# Patient Record
Sex: Female | Born: 1975 | Race: White | Hispanic: No | Marital: Married | Smoking: Former smoker
Health system: Southern US, Community
[De-identification: ages and names within clinical notes are randomized; demographics above are authoritative.]

## PROBLEM LIST (undated history)

## (undated) DIAGNOSIS — Z9889 Other specified postprocedural states: Secondary | ICD-10-CM

## (undated) DIAGNOSIS — Z8719 Personal history of other diseases of the digestive system: Secondary | ICD-10-CM

## (undated) DIAGNOSIS — Z87442 Personal history of urinary calculi: Secondary | ICD-10-CM

## (undated) DIAGNOSIS — F32A Depression, unspecified: Secondary | ICD-10-CM

## (undated) DIAGNOSIS — Z8489 Family history of other specified conditions: Secondary | ICD-10-CM

## (undated) DIAGNOSIS — M797 Fibromyalgia: Secondary | ICD-10-CM

## (undated) DIAGNOSIS — G43909 Migraine, unspecified, not intractable, without status migrainosus: Secondary | ICD-10-CM

## (undated) DIAGNOSIS — I10 Essential (primary) hypertension: Secondary | ICD-10-CM

## (undated) DIAGNOSIS — K760 Fatty (change of) liver, not elsewhere classified: Secondary | ICD-10-CM

## (undated) DIAGNOSIS — R202 Paresthesia of skin: Secondary | ICD-10-CM

## (undated) DIAGNOSIS — K633 Ulcer of intestine: Secondary | ICD-10-CM

## (undated) DIAGNOSIS — B009 Herpesviral infection, unspecified: Secondary | ICD-10-CM

## (undated) DIAGNOSIS — R768 Other specified abnormal immunological findings in serum: Secondary | ICD-10-CM

## (undated) DIAGNOSIS — K219 Gastro-esophageal reflux disease without esophagitis: Secondary | ICD-10-CM

## (undated) DIAGNOSIS — M1990 Unspecified osteoarthritis, unspecified site: Secondary | ICD-10-CM

## (undated) DIAGNOSIS — J45909 Unspecified asthma, uncomplicated: Secondary | ICD-10-CM

## (undated) DIAGNOSIS — L509 Urticaria, unspecified: Secondary | ICD-10-CM

## (undated) DIAGNOSIS — F419 Anxiety disorder, unspecified: Secondary | ICD-10-CM

## (undated) DIAGNOSIS — M549 Dorsalgia, unspecified: Secondary | ICD-10-CM

## (undated) DIAGNOSIS — S0300XA Dislocation of jaw, unspecified side, initial encounter: Secondary | ICD-10-CM

## (undated) DIAGNOSIS — F988 Other specified behavioral and emotional disorders with onset usually occurring in childhood and adolescence: Secondary | ICD-10-CM

## (undated) DIAGNOSIS — F329 Major depressive disorder, single episode, unspecified: Secondary | ICD-10-CM

## (undated) DIAGNOSIS — R112 Nausea with vomiting, unspecified: Secondary | ICD-10-CM

## (undated) HISTORY — DX: Depression, unspecified: F32.A

## (undated) HISTORY — DX: Other specified abnormal immunological findings in serum: R76.8

## (undated) HISTORY — PX: OTHER SURGICAL HISTORY: SHX169

## (undated) HISTORY — DX: Migraine, unspecified, not intractable, without status migrainosus: G43.909

## (undated) HISTORY — DX: Other specified behavioral and emotional disorders with onset usually occurring in childhood and adolescence: F98.8

## (undated) HISTORY — PX: ENDOMETRIAL ABLATION: SHX621

## (undated) HISTORY — DX: Dislocation of jaw, unspecified side, initial encounter: S03.00XA

## (undated) HISTORY — DX: Urticaria, unspecified: L50.9

## (undated) HISTORY — DX: Fibromyalgia: M79.7

## (undated) HISTORY — DX: Herpesviral infection, unspecified: B00.9

## (undated) HISTORY — DX: Major depressive disorder, single episode, unspecified: F32.9

## (undated) HISTORY — PX: SINOSCOPY: SHX187

## (undated) HISTORY — DX: Gastro-esophageal reflux disease without esophagitis: K21.9

## (undated) HISTORY — PX: HERNIA REPAIR: SHX51

## (undated) HISTORY — DX: Paresthesia of skin: R20.2

## (undated) HISTORY — PX: TUBAL LIGATION: SHX77

---

## 2000-08-08 ENCOUNTER — Emergency Department (HOSPITAL_COMMUNITY): Admission: EM | Admit: 2000-08-08 | Discharge: 2000-08-08 | Payer: Self-pay | Admitting: Emergency Medicine

## 2003-09-26 DIAGNOSIS — K589 Irritable bowel syndrome without diarrhea: Secondary | ICD-10-CM

## 2003-09-26 HISTORY — DX: Irritable bowel syndrome, unspecified: K58.9

## 2007-09-26 HISTORY — PX: PILONIDAL CYST / SINUS EXCISION: SUR543

## 2008-07-23 ENCOUNTER — Emergency Department (HOSPITAL_COMMUNITY): Admission: EM | Admit: 2008-07-23 | Discharge: 2008-07-23 | Payer: Self-pay | Admitting: Emergency Medicine

## 2009-12-18 ENCOUNTER — Emergency Department (HOSPITAL_COMMUNITY): Admission: EM | Admit: 2009-12-18 | Discharge: 2009-12-18 | Payer: Self-pay | Admitting: Emergency Medicine

## 2010-05-12 ENCOUNTER — Other Ambulatory Visit: Admission: RE | Admit: 2010-05-12 | Discharge: 2010-05-12 | Payer: Self-pay | Admitting: Obstetrics & Gynecology

## 2010-06-28 ENCOUNTER — Ambulatory Visit (HOSPITAL_COMMUNITY)
Admission: RE | Admit: 2010-06-28 | Discharge: 2010-06-28 | Payer: Self-pay | Source: Home / Self Care | Admitting: Obstetrics & Gynecology

## 2010-07-01 ENCOUNTER — Inpatient Hospital Stay (HOSPITAL_COMMUNITY): Admission: AD | Admit: 2010-07-01 | Discharge: 2010-07-01 | Payer: Self-pay | Admitting: Obstetrics & Gynecology

## 2010-07-01 ENCOUNTER — Ambulatory Visit: Payer: Self-pay | Admitting: Obstetrics and Gynecology

## 2010-07-05 ENCOUNTER — Inpatient Hospital Stay (HOSPITAL_COMMUNITY): Admission: AD | Admit: 2010-07-05 | Discharge: 2010-07-05 | Payer: Self-pay | Admitting: Obstetrics and Gynecology

## 2010-07-05 ENCOUNTER — Ambulatory Visit: Payer: Self-pay | Admitting: Obstetrics and Gynecology

## 2010-07-26 ENCOUNTER — Ambulatory Visit (HOSPITAL_COMMUNITY): Admission: RE | Admit: 2010-07-26 | Discharge: 2010-07-26 | Payer: Self-pay | Admitting: Obstetrics & Gynecology

## 2010-08-25 ENCOUNTER — Ambulatory Visit (HOSPITAL_COMMUNITY)
Admission: RE | Admit: 2010-08-25 | Discharge: 2010-08-25 | Payer: Self-pay | Source: Home / Self Care | Admitting: Obstetrics & Gynecology

## 2010-09-15 ENCOUNTER — Ambulatory Visit (HOSPITAL_COMMUNITY)
Admission: RE | Admit: 2010-09-15 | Discharge: 2010-09-15 | Payer: Self-pay | Source: Home / Self Care | Attending: Obstetrics & Gynecology | Admitting: Obstetrics & Gynecology

## 2010-10-13 ENCOUNTER — Ambulatory Visit (HOSPITAL_COMMUNITY)
Admission: RE | Admit: 2010-10-13 | Discharge: 2010-10-13 | Payer: Self-pay | Source: Home / Self Care | Attending: Obstetrics & Gynecology | Admitting: Obstetrics & Gynecology

## 2010-10-15 ENCOUNTER — Other Ambulatory Visit: Payer: Self-pay | Admitting: Obstetrics & Gynecology

## 2010-10-15 DIAGNOSIS — O2690 Pregnancy related conditions, unspecified, unspecified trimester: Secondary | ICD-10-CM

## 2010-11-03 ENCOUNTER — Ambulatory Visit (HOSPITAL_COMMUNITY)
Admission: RE | Admit: 2010-11-03 | Discharge: 2010-11-03 | Disposition: A | Discharge status: Home / Self Care | Payer: Medicaid Other | Source: Ambulatory Visit | Attending: Obstetrics & Gynecology | Admitting: Obstetrics & Gynecology

## 2010-11-03 ENCOUNTER — Other Ambulatory Visit: Payer: Self-pay | Admitting: Obstetrics & Gynecology

## 2010-11-03 DIAGNOSIS — O2690 Pregnancy related conditions, unspecified, unspecified trimester: Secondary | ICD-10-CM

## 2010-11-03 DIAGNOSIS — Z3689 Encounter for other specified antenatal screening: Secondary | ICD-10-CM | POA: Insufficient documentation

## 2010-11-03 DIAGNOSIS — O30009 Twin pregnancy, unspecified number of placenta and unspecified number of amniotic sacs, unspecified trimester: Secondary | ICD-10-CM | POA: Insufficient documentation

## 2010-11-03 DIAGNOSIS — O365990 Maternal care for other known or suspected poor fetal growth, unspecified trimester, not applicable or unspecified: Secondary | ICD-10-CM | POA: Insufficient documentation

## 2010-11-03 DIAGNOSIS — O09529 Supervision of elderly multigravida, unspecified trimester: Secondary | ICD-10-CM | POA: Insufficient documentation

## 2010-11-21 ENCOUNTER — Inpatient Hospital Stay (HOSPITAL_COMMUNITY)
Admission: AD | Admit: 2010-11-21 | Discharge: 2010-11-24 | DRG: 774 | Disposition: A | Discharge status: Home / Self Care | Payer: Medicaid Other | Source: Ambulatory Visit | Attending: Obstetrics & Gynecology | Admitting: Obstetrics & Gynecology

## 2010-11-21 DIAGNOSIS — O30009 Twin pregnancy, unspecified number of placenta and unspecified number of amniotic sacs, unspecified trimester: Secondary | ICD-10-CM

## 2010-11-21 DIAGNOSIS — IMO0002 Reserved for concepts with insufficient information to code with codable children: Secondary | ICD-10-CM | POA: Diagnosis present

## 2010-11-21 DIAGNOSIS — O09529 Supervision of elderly multigravida, unspecified trimester: Secondary | ICD-10-CM

## 2010-11-21 LAB — CBC
HCT: 34.2 % — ABNORMAL LOW (ref 36.0–46.0)
Hemoglobin: 11.1 g/dL — ABNORMAL LOW (ref 12.0–15.0)
MCH: 26.1 pg (ref 26.0–34.0)
Platelets: 298 10*3/uL (ref 150–400)
Platelets: 275 10*3/uL (ref 150–400)
RBC: 4.25 MIL/uL (ref 3.87–5.11)
RBC: 4.15 MIL/uL (ref 3.87–5.11)
RDW: 13.8 % (ref 11.5–15.5)
RDW: 13.9 % (ref 11.5–15.5)
WBC: 11.1 10*3/uL — ABNORMAL HIGH (ref 4.0–10.5)
WBC: 13.3 10*3/uL — ABNORMAL HIGH (ref 4.0–10.5)

## 2010-11-22 ENCOUNTER — Other Ambulatory Visit: Payer: Self-pay | Admitting: Family Medicine

## 2010-11-22 LAB — CBC
Hemoglobin: 11 g/dL — ABNORMAL LOW (ref 12.0–15.0)
MCHC: 32.3 g/dL (ref 30.0–36.0)
Platelets: 254 10*3/uL (ref 150–400)
RBC: 4.13 MIL/uL (ref 3.87–5.11)

## 2010-11-23 LAB — CBC
HCT: 30.6 % — ABNORMAL LOW (ref 36.0–46.0)
Hemoglobin: 9.5 g/dL — ABNORMAL LOW (ref 12.0–15.0)
MCV: 84.5 fL (ref 78.0–100.0)
Platelets: 237 10*3/uL (ref 150–400)

## 2010-11-24 NOTE — H&P (Signed)
  NAME:  Ruth Shah, Ruth Shah                ACCOUNT NO.:  [id number]  MEDICAL RECORD NO.:  [id number]           PATIENT TYPE:  I  LOCATION:  9168                          FACILITY:  [facility]  PHYSICIAN:  Tilda Burrow, M.D. DATE OF BIRTH:  April 01, 1976  DATE OF ADMISSION:  11/21/2010 DATE OF DISCHARGE:                             HISTORY & PHYSICAL   ADMITTING DIAGNOSES:  Pregnancy at 38 weeks 6 days, mild preeclampsia, monochorionic diamniotic twin with demise of co-twin at 44 weeks' gestation, now singleton pregnancy, and advanced maternal age.  HISTORY OF PRESENT ILLNESS:  This is a 35 year old female, gravida 5, para 3-0-1-3 is now 38 weeks 6 days after pregnancy followed through [facility] and maternal fetal care.  Pregnancy has been complicated by pregnancy that started out as a monochorionic diamniotic pregnancy with an membrane between the babies clearly identified.  We had fetal demise in utero at 15 weeks with one of the babies.  The subsequent pregnancy has been followed with serial growth ultrasounds at maternal fetal care with estimated fetal weight at the 17th percentile with a head sparing growth pattern with femur length less than the 3rd percentile.  Abdominal circumference is approximately 5th percentile. Amniotic fluid volume has remained normal.  Over the past few days, the patient has had progression of blood pressures from the 120-130/80 that was the rule for majority of the pregnancy to 140/90 and today her blood pressure is 158/98 accompanied by headache and some scotoma.  24-hour urine was collected on tube and submitted on November 18, 2010.  Those results of pending.  Reflexes are 3+.  Cervix is 2 cm, firm, long, anterior with vertex presentation confirmed at -2 station.  She was admitted for probable cervical ripening with Foley bulb followed by Pitocin and magnesium sulfate therapy.  PAST MEDICAL HISTORY:  Hypercholesterolemia.  SURGICAL HISTORY:  Sinus  surgery, ovarian cyst, back surgery.  ALLERGIES:  VICODIN and SULFA.  LABORATORY DATA:  Prenatal lab, blood type O+, antibody screen negative, rubella immunity present.  Hemoglobin 14, hematocrit 42, platelets 270,000 at last check HSV-2 positive, HIV and RPR, GC and Chlamydia are all negative.  Down syndrome risk was 1 and 6  but was considered unreliable due to the IUFD of the twin.  Glucose tolerance test normal.  Physical exam reveals a healthy Caucasian female, weight 165.4 which is 10-pound weight gain this pregnancy, blood pressure 158/98 and 160/100. Urinalysis is negative for protein again 24-hour urine is pending, fundal height is 37 cm.  NST today is reactive.  Vertex presentation was confirmed.  Cervix 2 cm firm, long anterior, vertex -2.  PLAN:  Admit for induction of labor, Dr. Natale Milch and Dr. Macon Large, room 171.  ADDENDUM:  The patient desires postpartum tubal ligation at 6 weeks after delivery.     Tilda Burrow, M.D.     JVF/MEDQ  D:  11/21/2010  T:  11/22/2010  Job:  [id number]  Electronically Signed by Christin Bach M.D. on 11/24/2010 08:46:50 PM

## 2010-12-08 LAB — CBC
MCHC: 34.7 g/dL (ref 30.0–36.0)
MCV: 93.7 fL (ref 78.0–100.0)
Platelets: 270 10*3/uL (ref 150–400)
RDW: 13 % (ref 11.5–15.5)
WBC: 14.4 10*3/uL — ABNORMAL HIGH (ref 4.0–10.5)

## 2010-12-08 LAB — URINALYSIS, ROUTINE W REFLEX MICROSCOPIC
Bilirubin Urine: NEGATIVE
Bilirubin Urine: NEGATIVE
Glucose, UA: NEGATIVE mg/dL
Leukocytes, UA: NEGATIVE
Nitrite: NEGATIVE
Specific Gravity, Urine: 1.025 (ref 1.005–1.030)
Urobilinogen, UA: 0.2 mg/dL (ref 0.0–1.0)
Urobilinogen, UA: 0.2 mg/dL (ref 0.0–1.0)
pH: 6 (ref 5.0–8.0)
pH: 6 (ref 5.0–8.0)

## 2010-12-08 LAB — URINE MICROSCOPIC-ADD ON

## 2010-12-08 LAB — COMPREHENSIVE METABOLIC PANEL
BUN: 7 mg/dL (ref 6–23)
Calcium: 9.3 mg/dL (ref 8.4–10.5)
GFR calc Af Amer: >60 mL/min (ref >60)
Potassium: 3.4 mEq/L — ABNORMAL LOW (ref 3.5–5.1)
Sodium: 135 mEq/L (ref 135–145)
Total Bilirubin: 0.7 mg/dL (ref 0.3–1.2)

## 2011-02-09 ENCOUNTER — Other Ambulatory Visit: Payer: Self-pay | Admitting: Obstetrics and Gynecology

## 2011-02-09 ENCOUNTER — Encounter (HOSPITAL_COMMUNITY): Payer: Medicaid Other

## 2011-02-09 LAB — CBC
HCT: 35.4 % — ABNORMAL LOW (ref 36.0–46.0)
Hemoglobin: 11.2 g/dL — ABNORMAL LOW (ref 12.0–15.0)
MCH: 26.3 pg (ref 26.0–34.0)
MCHC: 31.6 g/dL (ref 30.0–36.0)
MCV: 83.1 fL (ref 78.0–100.0)
Platelets: 302 10*3/uL (ref 150–400)
RBC: 4.26 MIL/uL (ref 3.87–5.11)
RDW: 15.4 % (ref 11.5–15.5)
WBC: 6.6 10*3/uL (ref 4.0–10.5)

## 2011-02-09 LAB — HCG, QUANTITATIVE, PREGNANCY: hCG, Beta Chain, Quant, S: 1 m[IU]/mL (ref <5)

## 2011-02-09 LAB — COMPREHENSIVE METABOLIC PANEL
ALT: 25 U/L (ref 0–35)
Chloride: 106 mEq/L (ref 96–112)
Creatinine, Ser: 0.78 mg/dL (ref 0.4–1.2)
GFR calc non Af Amer: >60 mL/min (ref >60)
Total Bilirubin: 0.4 mg/dL (ref 0.3–1.2)

## 2011-02-12 NOTE — H&P (Signed)
  Ruth Shah, Ruth Shah                ACCOUNT NO.:  [id number]  MEDICAL RECORD NO.:  [id number]           PATIENT TYPE:  LOCATION:                                 FACILITY:  PHYSICIAN:  Tilda Burrow, M.D. DATE OF BIRTH:  Aug 11, 1976  DATE OF ADMISSION:  02/14/2011 DATE OF DISCHARGE:  [facility]                             HISTORY & PHYSICAL   PREOPERATIVE DIAGNOSES: 1. Desire for elective permanent sterilization. 2. Menorrhagia. 3. Postpartum x2 months.  HISTORY OF PRESENT ILLNESS:  This 35 year old female, gravida 5, para 3- 1-2-4, with last delivery in March is admitted at this time 2 months postpartum for hysteroscopy, D&C with endometrial ablation to be performed at the time of her tubal sterilization.  Ruth Shah has a history of heavy periods predating her last pregnancy and resuming since she has delivered.  Menses are heavy x4-6 days requiring decreased activity for 3 days with bedrest required for 2 days every other month due to the severity of the bleeding and clotting.  There is associated dyspareunia that will not be addressed by the surgery.  The patient desires permanent sterilization and so a combined procedure is planned.  Both tubal sterilization and hysteroscopy, D&C have been reviewed with the patient.  Questions have been answered to the patient's satisfaction. She has had a routine uncomplicated postpartum course otherwise.  PAST MEDICAL HISTORY:  Positive for depression and anxiety.  SOCIAL HISTORY:  Married, husband does consume alcohol.  The patient's personal medical history negative for any current medications.  She denies alcohol, cigarettes, or recreational drugs.  PHYSICAL EXAMINATION:  GENERAL:  She is a healthy somber female who appears in good spirits, alert and oriented x3. EYES:  Pupils equal and reactive. NECK:  Supple.  Normal thyroid. CHEST:  Clear to auscultation. HEART/LUNGS:  Unremarkable. ABDOMEN:  No masses or hernias. EXTERNAL GENITALIA:   Multiparous postpartum.  Cervix bulbous.  Uterus is retroverted, upper limits normal size with sensitivity to uterine contact with bimanual exam.  Rectal support is adequate as is bladder support.  IMPRESSION: 1. Heavy menses (menorrhagia). 2. Desire for elective permanent sterilization. 3. Dyspareunia secondary to uterine retroversion not to be addressed     this visit.  PLAN:  Laparoscopic tubal sterilization, Falope rings, hysteroscopy, D&C, endometrial ablation to be performed Feb 14, 2011 at [facility] 9:00 a.m., preop May 17 at 9:00 a.m.     Tilda Burrow, M.D.     JVF/MEDQ  D:  02/09/2011  T:  02/09/2011  Job:  [id number]  Electronically Signed by Christin Bach M.D. on 02/12/2011 09:19:32 PM

## 2011-02-14 ENCOUNTER — Ambulatory Visit (HOSPITAL_COMMUNITY)
Admission: RE | Admit: 2011-02-14 | Discharge: 2011-02-14 | Disposition: A | Discharge status: Home / Self Care | Payer: Medicaid Other | Source: Ambulatory Visit | Attending: Obstetrics and Gynecology | Admitting: Obstetrics and Gynecology

## 2011-02-14 DIAGNOSIS — Z302 Encounter for sterilization: Secondary | ICD-10-CM | POA: Insufficient documentation

## 2011-02-14 DIAGNOSIS — N920 Excessive and frequent menstruation with regular cycle: Secondary | ICD-10-CM | POA: Insufficient documentation

## 2011-02-14 LAB — URINALYSIS, ROUTINE W REFLEX MICROSCOPIC
Leukocytes, UA: NEGATIVE
Nitrite: NEGATIVE
Specific Gravity, Urine: 1.025 (ref 1.005–1.030)

## 2011-02-14 LAB — URINE MICROSCOPIC-ADD ON

## 2011-02-18 NOTE — Op Note (Signed)
Ruth Shah, Ruth Shah                ACCOUNT NO.:  [id number]  MEDICAL RECORD NO.:  [id number]           PATIENT TYPE:  O  LOCATION:  DAYP                          FACILITY:  [facility]  PHYSICIAN:  Tilda Burrow, M.D. DATE OF BIRTH:  06/19/1976  DATE OF PROCEDURE:  02/14/2011 DATE OF DISCHARGE:                              OPERATIVE REPORT   PREOPERATIVE DIAGNOSES:  Elective sterilization and menorrhagia.  POSTOPERATIVE DIAGNOSES:  Elective sterilization and menorrhagia.  PROCEDURES: 1. Laparoscopic right tubal sterilization with Falope rings. 2. Hysteroscopy, dilatation and curettage, and endometrial ablation.  SURGEON:  Tilda Burrow, MD  FIRST ASSISTANT:  None.  ANESTHESIA:  General.  COMPLICATIONS:  None.  FINDINGS:  Evidence of prior left salpingo-oophorectomy.  Normal- appearing right tube and ovary.  Retroverted and retroflexed uterus, normal size.  Thin endometrium status post recent delivery 2 months ago.  INDICATIONS:  This is a 35 year old female 2 months postpartum, desiring permanent sterilization, with prior left ovary removal which was found to have been handled by left salpingo-oophorectomy.  The patient also gives a history of unacceptably heavy periods and requests endometrial ablation at this time.  DETAILS OF PROCEDURE:  The patient was taken to the operating room and prepped and draped for laparoscopic tubal sterilization.  Time-out conducted by all involved parties and procedure confirmed.  An infraumbilical vertical 1-cm skin incision was made and direct insertion technique used under 3 mmHg, carbon dioxide insufflation pressures, and easy entry into the peritoneal cavity performed with no evidence of injury to bowel.  The pelvis was inspected and the tubes and ovaries identified on the right with the left tube and ovary obviously removed in the past.  Photo documentation was taken of both sides, the right side then had Falope ring applied to its  midportion after manipulating the uterus and placing a suprapubic 8-mm trocar to allow introduction of the Falope ring applier.  Percutaneous Marcaine was injected into the incarcerated knuckle of tube as well as in the mesosalpinx beneath the Falope rings.  Saline 50 mL was injected into the abdomen to assist with evacuation of the gas, the abdomen was deflated, laparoscopic instruments were removed, and subcuticular 4-0 Vicryl closure of skin incisions performed.  Attention was then directed to the hysteroscopy, D and C, and endometrial ablation.  The position repositioned, the speculum inserted. Cervix grasped with single-tooth tenaculum, sounded to 8 cm dilated to 23-French, and hysteroscopy with a 23-mm 30-degree rigid hysteroscope was performed to confirm satisfactory identification of endometrial cavity with minimal tissue identified and no suspicious areas.  Brief curettage was performed removing a thin layer of endometrial debris and then once A1c and hysteroscopy repeated to confirm satisfactory evacuation of debris and then the Gynecare ThermaChoice III endometrial ablation device activated, inserted, insufflated with 15 mL of D5W and the 8-minute thermal ablation sequence completed.  All 8 mL were removed accounted for and then a percutaneous block with 20 mL of Marcaine solution was then injected around the uterus.  During the ablation process, the resting tone pressures gradually declined and upon reaching 130, we began to instill a bolus of Pitocin  which may have assisted with stabilizing the pressure dropped as the intrauterine pressures remained at 130 mmHg for the remainder of the case.  The patient tolerated the procedure well and went to recovery room in good condition.  Sponge and needle counts were correct.  The patient will receive hydrocodone and Motrin prescriptions prior to discharge and follow up in 2 weeks.     Tilda Burrow, M.D.     JVF/MEDQ  D:   02/14/2011  T:  02/15/2011  Job:  [id number]  cc:   [facility]  Electronically Signed by Christin Bach M.D. on 02/18/2011 11:26:29 PM

## 2011-06-27 LAB — URINE MICROSCOPIC-ADD ON

## 2011-06-27 LAB — DIFFERENTIAL
Eosinophils Absolute: 0.2
Eosinophils Relative: 2
Lymphocytes Relative: 16
Lymphs Abs: 1.6
Monocytes Absolute: 0.6
Monocytes Relative: 6

## 2011-06-27 LAB — CBC
HCT: 37.6
Hemoglobin: 13
MCV: 92.4
Platelets: 260
RBC: 4.07
WBC: 10.1

## 2011-06-27 LAB — URINALYSIS, ROUTINE W REFLEX MICROSCOPIC
Ketones, ur: NEGATIVE
Protein, ur: NEGATIVE
Urobilinogen, UA: 0.2

## 2015-08-12 ENCOUNTER — Encounter: Payer: Self-pay | Admitting: Women's Health

## 2015-08-12 ENCOUNTER — Other Ambulatory Visit (HOSPITAL_COMMUNITY)
Admission: RE | Admit: 2015-08-12 | Discharge: 2015-08-12 | Disposition: A | Discharge status: Home / Self Care | Payer: Medicaid Other | Source: Ambulatory Visit | Attending: Obstetrics & Gynecology | Admitting: Obstetrics & Gynecology

## 2015-08-12 ENCOUNTER — Ambulatory Visit (INDEPENDENT_AMBULATORY_CARE_PROVIDER_SITE_OTHER): Payer: Medicaid Other | Admitting: Women's Health

## 2015-08-12 VITALS — BP 136/78 | HR 68 | Ht 63.0 in | Wt 173.0 lb

## 2015-08-12 DIAGNOSIS — Z01419 Encounter for gynecological examination (general) (routine) without abnormal findings: Secondary | ICD-10-CM | POA: Insufficient documentation

## 2015-08-12 DIAGNOSIS — A630 Anogenital (venereal) warts: Secondary | ICD-10-CM

## 2015-08-12 DIAGNOSIS — Z309 Encounter for contraceptive management, unspecified: Secondary | ICD-10-CM

## 2015-08-12 DIAGNOSIS — Z1151 Encounter for screening for human papillomavirus (HPV): Secondary | ICD-10-CM | POA: Insufficient documentation

## 2015-08-12 DIAGNOSIS — N760 Acute vaginitis: Secondary | ICD-10-CM

## 2015-08-12 DIAGNOSIS — L292 Pruritus vulvae: Secondary | ICD-10-CM

## 2015-08-12 DIAGNOSIS — B3731 Acute candidiasis of vulva and vagina: Secondary | ICD-10-CM

## 2015-08-12 DIAGNOSIS — B009 Herpesviral infection, unspecified: Secondary | ICD-10-CM

## 2015-08-12 DIAGNOSIS — B373 Candidiasis of vulva and vagina: Secondary | ICD-10-CM

## 2015-08-12 DIAGNOSIS — B9689 Other specified bacterial agents as the cause of diseases classified elsewhere: Secondary | ICD-10-CM

## 2015-08-12 DIAGNOSIS — Z113 Encounter for screening for infections with a predominantly sexual mode of transmission: Secondary | ICD-10-CM | POA: Insufficient documentation

## 2015-08-12 LAB — POCT WET PREP (WET MOUNT): Clue Cells Wet Prep Whiff POC: NEGATIVE

## 2015-08-12 MED ORDER — METRONIDAZOLE 500 MG PO TABS: 500.0000 mg | ORAL_TABLET | Freq: Two times a day (BID) | ORAL | Status: DC

## 2015-08-12 MED ORDER — FLUCONAZOLE 150 MG PO TABS: 150.0000 mg | ORAL_TABLET | Freq: Once | ORAL | Status: DC

## 2015-08-12 NOTE — Patient Instructions (Signed)
[phone] and ask about mammogram scholarship

## 2015-08-12 NOTE — Progress Notes (Addendum)
Patient ID: Ruth Shah, female   DOB: 1976/07/04, 39 y.o.   MRN: [id number] Subjective:   Ruth Shah is a 39 y.o. [phone] Caucasian female here for a routine FP Mcaid well-woman exam.  No LMP recorded. Patient has had an ablation.    Current complaints: burning/itching/swelling after sex x last 6 months, only happens when has sex >1time in 48hrs. Husband works out of town so when he comes home sometimes do have sex multiple times. Doesn't feel like she is dry or that moisture is an issue. No odor. He does not wear a condom. Was told in past she may have vulvar eczema. Has h/o HSV2, occ outbreaks, also w/ h/o genital warts- no current problems.  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  Past Medical History Past Medical History  Diagnosis Date  . Fibromyalgia   . ADD (attention deficit disorder)   . Depression   . Herpes simplex disease     Past Surgical History Past Surgical History  Procedure Laterality Date  . Tubal ligation    . Ablation    . Hernia repair    . Cyst excision      Gynecologic History No obstetric history on file.  No LMP recorded. Patient has had an ablation. Contraception: s/p ablation Last Pap: 2012. Results were: normal Last mammogram: never. Results were: n/a Last TCS: never  Obstetric History OB History  No data available    Current Medications No current outpatient prescriptions on file prior to visit.   No current facility-administered medications on file prior to visit.    Review of Systems Patient denies any  headaches, blurred vision, shortness of breath, chest pain, abdominal pain, problems with bowel movements, urination, or intercourse.  Objective:  BP 136/78 mmHg  Pulse 68  Ht  (1.6 m)  Wt 173 lb (78.472 kg)  BMI 30.65 kg/m2 Physical Exam  General:  Well developed, well nourished, no acute distress. She is alert and oriented x3. Skin:  Warm and dry Neck:  Midline trachea, no thyromegaly or nodules Cardiovascular: Regular rate and rhythm, no murmur heard Lungs:  Effort normal, all lung fields clear to auscultation bilaterally Breasts:  No dominant palpable mass, retraction, or nipple discharge Abdomen:  Soft, non tender, no hepatosplenomegaly or masses Pelvic:  External genitalia is erythematous and slightly edematous.  The vagina is normal in appearance. The cervix is bulbous and anterior, no CMT.  Thin prep pap is done w/ HR HPV cotesting. Uterus is felt to be normal size, shape, and contour.  No adnexal masses or tenderness noted. Extremities:  No swelling or varicosities noted Psych:  She has a normal mood and affect  Results for orders placed or performed in visit on 08/12/15 (from the past 24 hour(s))  POCT Wet Prep [facility])     Status: Abnormal   Collection Time: 08/12/15 10:32 AM  Result Value Ref Range   Source Wet Prep POC vaginal    WBC, Wet Prep HPF POC none    Bacteria Wet Prep HPF POC None None, Few, Too numerous to count   BACTERIA WET PREP MORPHOLOGY POC  Clue Cells Wet Prep HPF POC Few (A) None, Too numerous to count   Clue Cells Wet Prep Whiff POC Negative Whiff    Yeast Wet Prep HPF POC Few    KOH Wet Prep POC     Trichomonas Wet Prep HPF POC none     Vulva painted w/ gentian violet after wet prep Assessment:   Healthy well-woman exam Vulvovaginal candida, painted w/ gentian violet BV  Plan:  GC/CT from pap, HIV, RPR today Rx diflucan and metronidazole, to call back if sx not improving F/U [age]yr for physical, or sooner if needed [facility]ammogram- [facility] screening [facility]o  Colonoscopy @39yo  or sooner if problems  Marge DuncansBooker, Irja Wheless Randall CNM, [facility]BC 08/12/2015 10:33 AM

## 2015-08-13 LAB — HIV ANTIBODY (ROUTINE TESTING W REFLEX): HIV Screen 4th Generation wRfx: NONREACTIVE

## 2015-08-13 LAB — RPR: RPR: NONREACTIVE

## 2015-08-16 LAB — CYTOLOGY - PAP

## 2016-05-19 ENCOUNTER — Ambulatory Visit (INDEPENDENT_AMBULATORY_CARE_PROVIDER_SITE_OTHER): Payer: Self-pay | Admitting: Neurology

## 2016-05-19 ENCOUNTER — Ambulatory Visit (INDEPENDENT_AMBULATORY_CARE_PROVIDER_SITE_OTHER): Payer: Medicaid Other | Admitting: Neurology

## 2016-05-19 DIAGNOSIS — Z0289 Encounter for other administrative examinations: Secondary | ICD-10-CM

## 2016-05-19 DIAGNOSIS — R202 Paresthesia of skin: Secondary | ICD-10-CM

## 2016-05-19 NOTE — Procedures (Signed)
   NCS (NERVE CONDUCTION STUDY) WITH EMG (ELECTROMYOGRAPHY) REPORT   STUDY DATE: May 19 2016 PATIENT NAME: Ruth Shah DOB: Mar 11, 1976 MRN: [id number]    TECHNOLOGIST: Gearldine ShownLorraine Jones ELECTROMYOGRAPHER: Levert FeinsteinYan, Kendrell Lottman M.D.  CLINICAL INFORMATION: 40 years old female presenting with more than 15 years history of intermittent bilateral hands paresthesia, gradually getting worse  On examination: Bilateral upper and lower extremity motor strength strength is normal, deep tendon reflexes are brisk and symmetric.  FINDINGS: NERVE CONDUCTION STUDY: Bilateral median, ulnar sensory and motor responses were normal.  NEEDLE ELECTROMYOGRAPHY: Selective needle examinations were performed at bilateral upper extremity muscles bilateral cervical paraspinal muscles.  Needle examination of bilateral abductor pollicis brevis, first dorsal interossei, brachioradialis, biceps, triceps, deltoid, was normal.  There was no spontaneous activity at bilateral cervical paraspinal muscles, bilateral C5-6-7.  IMPRESSION:   This is a normal study. There is no electrodiagnostic evidence of upper extremity neuropathy or bilateral cervical radiculopathy.   INTERPRETING PHYSICIAN:   Levert FeinsteinYan, Amonie Wisser M.D. Ph.D. [facility] [address], [address] [place], [place] [address] [phone]

## 2016-05-19 NOTE — Progress Notes (Signed)
EMG nerve conduction study report under the procedure section

## 2016-06-06 ENCOUNTER — Encounter: Payer: Self-pay | Admitting: Neurology

## 2016-06-06 ENCOUNTER — Ambulatory Visit (INDEPENDENT_AMBULATORY_CARE_PROVIDER_SITE_OTHER): Payer: Medicaid Other | Admitting: Neurology

## 2016-06-06 VITALS — BP 131/88 | HR 84 | Ht 63.0 in | Wt 170.5 lb

## 2016-06-06 DIAGNOSIS — M542 Cervicalgia: Secondary | ICD-10-CM | POA: Diagnosis not present

## 2016-06-06 DIAGNOSIS — G43709 Chronic migraine without aura, not intractable, without status migrainosus: Secondary | ICD-10-CM

## 2016-06-06 DIAGNOSIS — R202 Paresthesia of skin: Secondary | ICD-10-CM | POA: Diagnosis not present

## 2016-06-06 MED ORDER — NORTRIPTYLINE HCL 25 MG PO CAPS: 50.0000 mg | ORAL_CAPSULE | Freq: Every day | ORAL | 11 refills | Status: DC

## 2016-06-06 MED ORDER — ONDANSETRON HCL 4 MG PO TABS: 4.0000 mg | ORAL_TABLET | Freq: Three times a day (TID) | ORAL | 6 refills | Status: DC | PRN

## 2016-06-06 MED ORDER — SUMATRIPTAN SUCCINATE 50 MG PO TABS: 50.0000 mg | ORAL_TABLET | ORAL | 6 refills | Status: DC | PRN

## 2016-06-06 NOTE — Progress Notes (Signed)
PATIENT: Ruth Shah DOB: Feb 04, 1976  Chief Complaint  Patient presents with  . Numbness    She is here with her husband, Ruth Shah.  The numbness in her bilateral hands have been present for years.  Symptoms worse at night.  She would like to review her NCV/EMG and treatment options.     HISTORICAL  Ruth Shah is a 39 years old right-handed female, accompanied by her husband Ruth Shah, seen in refer by her primary care physician Dr. Selinda Flavin for evaluation of bilateral hands paresthesia.  She had a history of depression, taking Zoloft 50 mg daily, reported under good control, ADHD, is taking Adderall XR 20 mg every morning since 2015, she works as Armed forces training and education officer, has 4 children. I saw her initially on August 25th 2017 for complains of bilateral hands numbness, electrodiagnostic study was normal, there was no evidence of bilateral upper extremity neuropathy or cervical radiculopathy.  Her hands has been bother her Since 2002, she reported previous normal EMG nerve conduction study too. Over the years, she has worsening bilateral hands paresthesia, involving all 5 fingers, radiating to to her forearm, elbow, since 2016, she also noticed neck pain, mild unbalanced gait, she has urinary urgency, she contributed to her fall vaginal delivery.  Also has a history of chronic migraine since 2007, used to have migraine once a month, since 2015, she has couple migraines each week, bilateral severe pounding headache with associated light noise sensitivity, trigger for her migraines are weather change, stress, sleep deprivation, hungry, bright light, strong smells, her headache lasting for hours to one day, she has been taking naproxen 500 mg every morning since March 2017 for her mild to moderate headaches is limited result,  She also complains of chronic neck pain, upper back pain, radiating pain to occipital region,  REVIEW OF SYSTEMS: Full 14 system review of systems performed and notable only  for  as above  ALLERGIES: Allergies  Allergen Reactions  . Vicodin [Hydrocodone-Acetaminophen] Itching    HOME MEDICATIONS: Current Outpatient Prescriptions  Medication Sig Dispense Refill  . ADDERALL XR 20 MG 24 hr capsule Take 20 mg by mouth every morning.  0  . sertraline (ZOLOFT) 50 MG tablet Take 50 mg by mouth daily.  2   No current facility-administered medications for this visit.     PAST MEDICAL HISTORY: Past Medical History:  Diagnosis Date  . ADD (attention deficit disorder)   . Depression   . Fibromyalgia   . Herpes simplex disease   . Paresthesia of both hands     PAST SURGICAL HISTORY: Past Surgical History:  Procedure Laterality Date  . ABLATION    . CYST EXCISION    . HERNIA REPAIR    . TUBAL LIGATION      FAMILY HISTORY: Family History  Problem Relation Age of Onset  . Cancer Mother     skin  . Depression Mother   . Hypertension Mother   . Hyperlipidemia Mother   . Cancer Father     skin  . Arthritis Father   . Hypertension Father   . Hyperlipidemia Father   . Depression Maternal Grandmother   . Hypertension Maternal Grandmother   . Mental illness Maternal Grandmother   . Cancer Maternal Grandfather     kidney  . Hypertension Maternal Grandfather   . Kidney disease Maternal Grandfather   . Emphysema Paternal Grandmother   . Alzheimer's disease Paternal Grandmother   . Parkinson's disease Paternal Grandfather     SOCIAL  HISTORY:  Social History   Social History  . Marital status: Married    Spouse name: N/A  . Number of children: 4  . Years of education: Some College   Occupational History  . Not on file.   Social History Main Topics  . Smoking status: Never Smoker  . Smokeless tobacco: Never Used  . Alcohol use No  . Drug use: No  . Sexual activity: Yes    Birth control/ protection: Surgical   Other Topics Concern  . Not on file   Social History Narrative   Lives at home with husband and children.   Right-handed.     4 cups caffeine per week.     PHYSICAL EXAM   Vitals:   06/06/16 0926  BP: 131/88  Pulse: 84  Weight: 170 lb 8 oz (77.3 kg)  Height: 5\' 3"  (1.6 m)    Not recorded      Body mass index is 30.2 kg/m.  PHYSICAL EXAMNIATION:  Gen: NAD, conversant, well nourised, obese, well groomed                     Cardiovascular: Regular rate rhythm, no peripheral edema, warm, nontender. Eyes: Conjunctivae clear without exudates or hemorrhage Neck: Supple, no carotid bruise. Pulmonary: Clear to auscultation bilaterally   NEUROLOGICAL EXAM:  MENTAL STATUS: Speech:    Speech is normal; fluent and spontaneous with normal comprehension.  Cognition:     Orientation to time, place and person     Normal recent and remote memory     Normal Attention span and concentration     Normal Language, naming, repeating,spontaneous speech     Fund of knowledge   CRANIAL NERVES: CN II: Visual fields are full to confrontation. Fundoscopic exam is normal with sharp discs and no vascular changes. Pupils are round equal and briskly reactive to light. CN III, IV, VI: extraocular movement are normal. No ptosis. CN V: Facial sensation is intact to pinprick in all 3 divisions bilaterally. Corneal responses are intact.  CN VII: Face is symmetric with normal eye closure and smile. CN VIII: Hearing is normal to rubbing fingers CN IX, X: Palate elevates symmetrically. Phonation is normal. CN XI: Head turning and shoulder shrug are intact CN XII: Tongue is midline with normal movements and no atrophy.  MOTOR: There is no pronator drift of out-stretched arms. Muscle bulk and tone are normal. Muscle strength is normal.  REFLEXES: Reflexes are 2+ and symmetric at the biceps, triceps, knees, and ankles. Plantar responses are flexor.  SENSORY: Intact to light touch, pinprick, positional sensation and vibratory sensation are intact in fingers and toes.  COORDINATION: Rapid alternating movements and fine  finger movements are intact. There is no dysmetria on finger-to-nose and heel-knee-shin.    GAIT/STANCE: Posture is normal. Gait is steady with normal steps, base, arm swing, and turning. Heel and toe walking are normal. Tandem gait is normal.  Romberg is absent.   DIAGNOSTIC DATA (LABS, IMAGING, TESTING) - I reviewed patient records, labs, notes, testing and imaging myself where available.   ASSESSMENT AND PLAN  Ruth Shah is a 40 y.o. female   Chronic migraine headaches Neck pain, four extremity paresthesia  Normal EMG nerve conduction studies, brisk reflex on examinations,   MRI of cervical spine to rule out cervical pathology   Add on nortriptyline 25 mg titrating to 50 mg for migraine prevention and pain  Imitrex as needed for migraine    Levert Feinstein, M.D. Ph.D.  [facility] [address], [address] [place], [place] [address] Ph: [phone] Fax: [phone]  CC: Selinda FlavinKevin Howard, MD

## 2016-06-07 ENCOUNTER — Encounter: Payer: Self-pay | Admitting: *Deleted

## 2016-06-07 LAB — CBC
HEMOGLOBIN: 14 g/dL (ref 11.1–15.9)
Hematocrit: 41.4 % (ref 34.0–46.6)
MCH: 30.6 pg (ref 26.6–33.0)
MCHC: 33.8 g/dL (ref 31.5–35.7)
MCV: 90 fL (ref 79–97)
PLATELETS: 322 10*3/uL (ref 150–379)
RBC: 4.58 x10E6/uL (ref 3.77–5.28)
RDW: 13.1 % (ref 12.3–15.4)
WBC: 8.6 10*3/uL (ref 3.4–10.8)

## 2016-06-07 LAB — COMPREHENSIVE METABOLIC PANEL
A/G RATIO: 1.9 (ref 1.2–2.2)
ALBUMIN: 4.5 g/dL (ref 3.5–5.5)
ALT: 16 IU/L (ref 0–32)
AST: 14 IU/L (ref 0–40)
Alkaline Phosphatase: 82 IU/L (ref 39–117)
BILIRUBIN TOTAL: 0.9 mg/dL (ref 0.0–1.2)
BUN / CREAT RATIO: 16 (ref 9–23)
BUN: 13 mg/dL (ref 6–24)
CALCIUM: 9.4 mg/dL (ref 8.7–10.2)
CO2: 26 mmol/L (ref 18–29)
Chloride: 101 mmol/L (ref 96–106)
Creatinine, Ser: 0.79 mg/dL (ref 0.57–1.00)
GFR, EST AFRICAN AMERICAN: 108 mL/min/{1.73_m2} (ref >59)
GFR, EST NON AFRICAN AMERICAN: 93 mL/min/{1.73_m2} (ref >59)
GLUCOSE: 80 mg/dL (ref 65–99)
Globulin, Total: 2.4 g/dL (ref 1.5–4.5)
Potassium: 4.5 mmol/L (ref 3.5–5.2)
Sodium: 141 mmol/L (ref 134–144)
TOTAL PROTEIN: 6.9 g/dL (ref 6.0–8.5)

## 2016-06-07 LAB — RPR: RPR Ser Ql: NONREACTIVE

## 2016-06-07 LAB — TSH: TSH: 1.88 u[IU]/mL (ref 0.450–4.500)

## 2016-06-07 LAB — VITAMIN B12: Vitamin B-12: 332 pg/mL (ref 211–946)

## 2016-06-07 LAB — VITAMIN D 25 HYDROXY (VIT D DEFICIENCY, FRACTURES): Vit D, 25-Hydroxy: 19 ng/mL — ABNORMAL LOW (ref 30.0–100.0)

## 2016-06-10 ENCOUNTER — Encounter (HOSPITAL_COMMUNITY): Payer: Self-pay | Admitting: Emergency Medicine

## 2016-06-10 ENCOUNTER — Emergency Department (HOSPITAL_COMMUNITY)
Admission: EM | Admit: 2016-06-10 | Discharge: 2016-06-10 | Disposition: A | Discharge status: Home / Self Care | Payer: Medicaid Other | Attending: Emergency Medicine | Admitting: Emergency Medicine

## 2016-06-10 DIAGNOSIS — F909 Attention-deficit hyperactivity disorder, unspecified type: Secondary | ICD-10-CM | POA: Insufficient documentation

## 2016-06-10 DIAGNOSIS — T445X1A Poisoning by predominantly beta-adrenoreceptor agonists, accidental (unintentional), initial encounter: Secondary | ICD-10-CM | POA: Insufficient documentation

## 2016-06-10 DIAGNOSIS — Y939 Activity, unspecified: Secondary | ICD-10-CM | POA: Insufficient documentation

## 2016-06-10 DIAGNOSIS — Z79899 Other long term (current) drug therapy: Secondary | ICD-10-CM | POA: Diagnosis not present

## 2016-06-10 DIAGNOSIS — IMO0001 Reserved for inherently not codable concepts without codable children: Secondary | ICD-10-CM

## 2016-06-10 DIAGNOSIS — W461XXA Contact with contaminated hypodermic needle, initial encounter: Secondary | ICD-10-CM | POA: Insufficient documentation

## 2016-06-10 DIAGNOSIS — Y929 Unspecified place or not applicable: Secondary | ICD-10-CM | POA: Insufficient documentation

## 2016-06-10 DIAGNOSIS — Y999 Unspecified external cause status: Secondary | ICD-10-CM | POA: Diagnosis not present

## 2016-06-10 DIAGNOSIS — S6991XA Unspecified injury of right wrist, hand and finger(s), initial encounter: Secondary | ICD-10-CM | POA: Diagnosis present

## 2016-06-10 DIAGNOSIS — S6981XA Other specified injuries of right wrist, hand and finger(s), initial encounter: Secondary | ICD-10-CM

## 2016-06-10 NOTE — ED Notes (Signed)
Myself and Ruth T. poke with Poison control. Pt currently has her hand soaking in warm water and massaging it per Poison controls advice.

## 2016-06-10 NOTE — ED Provider Notes (Signed)
[facility] DEPT Provider Note   CSN: [id number] Arrival date & time: 06/10/16  1947     History   Chief Complaint Chief Complaint  Patient presents with  . Finger Injury    HPI Ruth Shah is a 40 y.o. female.  HPI   Ruth Shah is a 40 y.o. female who presents to the Emergency Department complaining of sudden onset of right thumb pain, numbness and discoloration after accidentally injecting an epi pen into her thumb.  She was trying to inject her husband with epi after a bee sting and accidentally injected an unknown amt of epinephrine into her distal thumb,  Needle entered the fat pad of the finger and exited just proximal to the nail. Incident occurred approximately one hr prior to arrival. She states the her finger feels cold.  She denies numbness, chest pain, shortness of breath, palpitations or rapid heart rate.     Past Medical History:  Diagnosis Date  . ADD (attention deficit disorder)   . Depression   . Fibromyalgia   . Herpes simplex disease   . Paresthesia of both hands     Patient Active Problem List   Diagnosis Date Noted  . 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 H/O Genital warts 08/12/2015    Past Surgical History:  Procedure Laterality Date  . ABLATION    . CYST EXCISION    . HERNIA REPAIR    . TUBAL LIGATION      OB History    No data available       Home Medications    Prior to Admission medications   Medication Sig Start Date End Date Taking? Authorizing Provider  ADDERALL XR 20 MG 24 hr capsule Take 20 mg by mouth every morning. 05/03/16   Historical Provider, MD  Cholecalciferol (VITAMIN D-3) 1000 units CAPS Take by mouth daily.    Historical Provider, MD  nortriptyline (PAMELOR) 25 MG capsule Take 2 capsules (50 mg total) by mouth at bedtime. 06/06/16   Levert FeinsteinYijun Yan, MD  ondansetron (ZOFRAN) 4 MG tablet Take 1 tablet (4 mg total) by mouth every 8 (eight) hours as needed for nausea or vomiting. 06/06/16   Levert FeinsteinYijun Yan, MD  sertraline (ZOLOFT)  50 MG tablet Take 50 mg by mouth daily. 05/03/16   Historical Provider, MD  SUMAtriptan (IMITREX) 50 MG tablet Take 1 tablet (50 mg total) by mouth every 2 (two) hours as needed for migraine. May repeat in 2 hours if headache persists or recurs. 06/06/16   Levert FeinsteinYijun Yan, MD    Family History Family History  Problem Relation Age of Onset  . Cancer Mother     skin  . Depression Mother   . Hypertension Mother   . Hyperlipidemia Mother   . Cancer Father     skin  . Arthritis Father   . Hypertension Father   . Hyperlipidemia Father   . Depression Maternal Grandmother   . Hypertension Maternal Grandmother   . Mental illness Maternal Grandmother   . Cancer Maternal Grandfather     kidney  . Hypertension Maternal Grandfather   . Kidney disease Maternal Grandfather   . Emphysema Paternal Grandmother   . Alzheimer's disease Paternal Grandmother   . Parkinson's disease Paternal Grandfather     Social History Social History  Substance Use Topics  . Smoking status: Never Smoker  . Smokeless tobacco: Never Used  . Alcohol use No     Allergies   Vicodin [hydrocodone-acetaminophen]   Review of Systems Review of  Systems  Constitutional: Negative for chills and fever.  Genitourinary: Negative for difficulty urinating and dysuria.  Musculoskeletal: Positive for arthralgias (right thumb pain and discoloration). Negative for joint swelling.  Skin: Negative for color change and wound.  All other systems reviewed and are negative.    Physical Exam Updated Vital Signs BP (!) 153/103 (BP Location: Left Arm)   Pulse 106   Temp 98.3 F (36.8 C) (Oral)   Resp 20   Ht 5\' 3"  (1.6 m)   Wt 77.1 kg   SpO2 98%   BMI 30.11 kg/m   Physical Exam  Constitutional: She is oriented to person, place, and time. She appears well-developed and well-nourished. No distress.  HENT:  Head: Normocephalic.  Eyes: EOM are normal. Pupils are equal, round, and reactive to light.  Cardiovascular: Normal  rate, regular rhythm, normal heart sounds and intact distal pulses.   Pulmonary/Chest: Effort normal. No respiratory distress.  Musculoskeletal: Normal range of motion.  Mild pallor of the distal right thumb. Cap refill delayed. Puncture wound to the fat pad to the right thumb with exist wound just proximal to the nail.  No active bleeding.  Pt has FROM  Neurological: She is alert and oriented to person, place, and time.  Nursing note and vitals reviewed.    ED Treatments / Results  Labs (all labs ordered are listed, but only abnormal results are displayed) Labs Reviewed - No data to display  EKG  EKG Interpretation None       Radiology No results found.  Procedures Procedures (including critical care time)  Medications Ordered in ED Medications - No data to display   Initial Impression / Assessment and Plan / ED Course  I have reviewed the triage vital signs and the nursing notes.  Pertinent labs & imaging results that were available during my care of the patient were reviewed by me and considered in my medical decision making (see chart for details).  Clinical Course   2100  [facility] Poison Control.  Advised to use warm compresses or soaks and gentle massage to the finger.  Observe for one hr.     1010  Patient re-evaluated, thumb is now warm and pink, only minimal numbness at the fat pad.  cap refill 2 sec.  Distal sensation improved.  Well appearing.  Vitals stable.  No systemic sx's.  Pt appears stable for d/c, agrees to continue warm compresses.    Final Clinical Impressions(s) / ED Diagnoses   Final diagnoses:  Accidental injection of epinephrine, initial encounter  Needle stick injury of finger, right, initial encounter    New Prescriptions New Prescriptions   No medications on file     Rosey Bath 06/13/16 0114    Eber Hong, MD 06/14/16 [phone]

## 2016-06-10 NOTE — Discharge Instructions (Signed)
Continue gentle massage of the thumb and warm compresses.  Return to ER for any worsening symptoms

## 2016-06-10 NOTE — ED Triage Notes (Signed)
Pt stuck by EPIPEN in R thumb as she was trying to administer medication to her husband.

## 2016-06-20 ENCOUNTER — Inpatient Hospital Stay: Admission: RE | Admit: 2016-06-20 | Payer: Medicaid Other | Source: Ambulatory Visit

## 2016-06-29 ENCOUNTER — Ambulatory Visit
Admission: RE | Admit: 2016-06-29 | Discharge: 2016-06-29 | Disposition: A | Discharge status: Home / Self Care | Payer: Medicaid Other | Source: Ambulatory Visit | Attending: Neurology | Admitting: Neurology

## 2016-06-29 DIAGNOSIS — R202 Paresthesia of skin: Secondary | ICD-10-CM

## 2016-06-29 DIAGNOSIS — G43709 Chronic migraine without aura, not intractable, without status migrainosus: Secondary | ICD-10-CM

## 2016-06-29 DIAGNOSIS — M542 Cervicalgia: Secondary | ICD-10-CM

## 2016-07-03 ENCOUNTER — Telehealth: Payer: Self-pay | Admitting: Neurology

## 2016-07-03 NOTE — Telephone Encounter (Signed)
error 

## 2016-07-20 ENCOUNTER — Encounter: Payer: Self-pay | Admitting: Neurology

## 2016-07-20 ENCOUNTER — Ambulatory Visit (INDEPENDENT_AMBULATORY_CARE_PROVIDER_SITE_OTHER): Payer: Medicaid Other | Admitting: Neurology

## 2016-07-20 DIAGNOSIS — IMO0002 Reserved for concepts with insufficient information to code with codable children: Secondary | ICD-10-CM

## 2016-07-20 DIAGNOSIS — M542 Cervicalgia: Secondary | ICD-10-CM

## 2016-07-20 DIAGNOSIS — G43709 Chronic migraine without aura, not intractable, without status migrainosus: Secondary | ICD-10-CM | POA: Diagnosis not present

## 2016-07-20 MED ORDER — TIZANIDINE HCL 2 MG PO TABS: 2.0000 mg | ORAL_TABLET | Freq: Four times a day (QID) | ORAL | 6 refills | Status: DC | PRN

## 2016-07-20 MED ORDER — SUMATRIPTAN SUCCINATE 100 MG PO TABS: 100.0000 mg | ORAL_TABLET | ORAL | 11 refills | Status: DC | PRN

## 2016-07-20 MED ORDER — TOPIRAMATE 100 MG PO TABS: 100.0000 mg | ORAL_TABLET | Freq: Two times a day (BID) | ORAL | 11 refills | Status: DC

## 2016-07-20 NOTE — Progress Notes (Signed)
PATIENT: Ruth Shah DOB: 1976-09-16  Chief Complaint  Patient presents with  . Migraine    Says she could not tolerate nortriptyline 25mg  at bedtime due to intolerable drowsiness the following day.  Imitrex has been mildly helpful for her pain.  She estimates 2-3 migraine days per week.  She had a Toradol injection at her PCP yesterday for migraine and neck pain but it caused her nausea.  . Neck Pain    She would like to review her MRI results.     HISTORICAL  Ruth Shah is a 40 years old right-handed female, accompanied by her husband [name], seen in refer by her primary care physician Dr. Selinda Flavin for evaluation of bilateral hands paresthesia.  She had a history of depression, taking Zoloft 50 mg daily, reported under good control, ADHD, is taking Adderall XR 20 mg every morning since 2015, she works as Armed forces training and education officer, has 4 children. I saw her initially on August 25th 2017 for complains of bilateral hands numbness, electrodiagnostic study was normal, there was no evidence of bilateral upper extremity neuropathy or cervical radiculopathy.  Her hands has been bother her Since 2002, she reported previous normal EMG nerve conduction study too. Over the years, she has worsening bilateral hands paresthesia, involving all 5 fingers, radiating to to her forearm, elbow, since 2016, she also noticed neck pain, mild unbalanced gait, she has urinary urgency, she contributed to her fall vaginal delivery.  Also has a history of chronic migraine since 2007, used to have migraine once a month, since 2015, she has couple migraines each week, bilateral severe pounding headache with associated light noise sensitivity, trigger for her migraines are weather change, stress, sleep deprivation, hungry, bright light, strong smells, her headache lasting for hours to one day, she has been taking naproxen 500 mg every morning since March 2017 for her mild to moderate headaches is limited result,  She also  complains of chronic neck pain, upper back pain, radiating pain to occipital region,  UPDATE Oct 26th 2017: She could not tolerate nortriptyline, complains of early morning drowsiness, she has tried Imitrex 50 mg as needed, with mild improvement, she has migraine 2-3 times each week,  Complains constant neck pressure, taking naproxen 500 mg daily We have personally reviewed MRI cervical spine in October 2017, mild multilevel degenerative disc disease, most severe at C5-6 C6-7, no significant canal or foraminal stenosis.   REVIEW OF SYSTEMS: Full 14 system review of systems performed and notable only for  as above  ALLERGIES: Allergies  Allergen Reactions  . Sulfa Antibiotics Nausea Only  . Toradol [Ketorolac Tromethamine] Nausea Only  . Vicodin [Hydrocodone-Acetaminophen] Itching    HOME MEDICATIONS: Current Outpatient Prescriptions  Medication Sig Dispense Refill  . ADDERALL XR 20 MG 24 hr capsule Take 20 mg by mouth every morning.  0  . Cholecalciferol (VITAMIN D-3) 1000 units CAPS Take by mouth daily.    . ondansetron (ZOFRAN) 4 MG tablet Take 1 tablet (4 mg total) by mouth every 8 (eight) hours as needed for nausea or vomiting. 20 tablet 6  . sertraline (ZOLOFT) 50 MG tablet Take 50 mg by mouth daily.  2  . SUMAtriptan (IMITREX) 50 MG tablet Take 1 tablet (50 mg total) by mouth every 2 (two) hours as needed for migraine. May repeat in 2 hours if headache persists or recurs. 15 tablet 6   No current facility-administered medications for this visit.     PAST MEDICAL HISTORY: Past Medical History:  Diagnosis Date  . ADD (attention deficit disorder)   . Depression   . Fibromyalgia   . Herpes simplex disease   . Paresthesia of both hands     PAST SURGICAL HISTORY: Past Surgical History:  Procedure Laterality Date  . ABLATION    . CYST EXCISION    . HERNIA REPAIR    . TUBAL LIGATION      FAMILY HISTORY: Family History  Problem Relation Age of Onset  . Cancer Mother       skin  . Depression Mother   . Hypertension Mother   . Hyperlipidemia Mother   . Cancer Father     skin  . Arthritis Father   . Hypertension Father   . Hyperlipidemia Father   . Depression Maternal Grandmother   . Hypertension Maternal Grandmother   . Mental illness Maternal Grandmother   . Cancer Maternal Grandfather     kidney  . Hypertension Maternal Grandfather   . Kidney disease Maternal Grandfather   . Emphysema Paternal Grandmother   . Alzheimer's disease Paternal Grandmother   . Parkinson's disease Paternal Grandfather     SOCIAL HISTORY:  Social History   Social History  . Marital status: Married    Spouse name: N/A  . Number of children: 4  . Years of education: Some College   Occupational History  . Not on file.   Social History Main Topics  . Smoking status: Never Smoker  . Smokeless tobacco: Never Used  . Alcohol use No  . Drug use: No  . Sexual activity: Yes    Birth control/ protection: Surgical   Other Topics Concern  . Not on file   Social History Narrative   Lives at home with husband and children.   Right-handed.   4 cups caffeine per week.     PHYSICAL EXAM   Vitals:   07/20/16 1136  BP: (!) 144/96  Pulse: 82  Weight: 172 lb (78 kg)  Height: 5\' 3"  (1.6 m)    Not recorded      Body mass index is 30.47 kg/m.  PHYSICAL EXAMNIATION:  Gen: NAD, conversant, well nourised, obese, well groomed                     Cardiovascular: Regular rate rhythm, no peripheral edema, warm, nontender. Eyes: Conjunctivae clear without exudates or hemorrhage Neck: Supple, no carotid bruise. Pulmonary: Clear to auscultation bilaterally   NEUROLOGICAL EXAM:  MENTAL STATUS: Speech:    Speech is normal; fluent and spontaneous with normal comprehension.  Cognition:     Orientation to time, place and person     Normal recent and remote memory     Normal Attention span and concentration     Normal Language, naming, repeating,spontaneous  speech     Fund of knowledge   CRANIAL NERVES: CN II: Visual fields are full to confrontation. Fundoscopic exam is normal with sharp discs and no vascular changes. Pupils are round equal and briskly reactive to light. CN III, IV, VI: extraocular movement are normal. No ptosis. CN V: Facial sensation is intact to pinprick in all 3 divisions bilaterally. Corneal responses are intact.  CN VII: Face is symmetric with normal eye closure and smile. CN VIII: Hearing is normal to rubbing fingers CN IX, X: Palate elevates symmetrically. Phonation is normal. CN XI: Head turning and shoulder shrug are intact CN XII: Tongue is midline with normal movements and no atrophy.  MOTOR: There is no pronator drift of out-stretched  arms. Muscle bulk and tone are normal. Muscle strength is normal.  REFLEXES: Reflexes are 2+ and symmetric at the biceps, triceps, knees, and ankles. Plantar responses are flexor.  SENSORY: Intact to light touch, pinprick, positional sensation and vibratory sensation are intact in fingers and toes.  COORDINATION: Rapid alternating movements and fine finger movements are intact. There is no dysmetria on finger-to-nose and heel-knee-shin.    GAIT/STANCE: Posture is normal. Gait is steady with normal steps, base, arm swing, and turning. Heel and toe walking are normal. Tandem gait is normal.  Romberg is absent.   DIAGNOSTIC DATA (LABS, IMAGING, TESTING) - I reviewed patient records, labs, notes, testing and imaging myself where available.   ASSESSMENT AND PLAN  Ruth Shah is a 40 y.o. female   Chronic migraine headaches Neck pain, four extremity paresthesia  Normal EMG nerve conduction studies, brisk reflex on examinations,   MRI of cervical spine in October 2017 showed multilevel degenerative disc disease there was no significant canal or foraminal stenosis  She could not tolerate nortriptyline 25 mg, did not help her headache cause early morning drowsiness  Imitrex  50 mg as needed only helped her mildly,  Increased Imitrex to 100 mg as needed, add on tizanidine 2 mg as needed, Zofran as needed for severe prolonged migraine headaches  Start Topamax 100 mg twice a day as migraine prevention   Levert FeinsteinYijun Mariaisabel Bodiford, M.D. Ph.D.  [facility] [address], [address] [place], [place] [address] Ph: [phone] Fax: [phone]  CC: Selinda FlavinKevin Howard, MD

## 2016-10-19 ENCOUNTER — Ambulatory Visit (INDEPENDENT_AMBULATORY_CARE_PROVIDER_SITE_OTHER): Payer: Medicaid Other | Admitting: Nurse Practitioner

## 2016-10-19 ENCOUNTER — Encounter: Payer: Self-pay | Admitting: Nurse Practitioner

## 2016-10-19 VITALS — BP 141/89 | HR 90 | Ht 63.0 in | Wt 165.2 lb

## 2016-10-19 DIAGNOSIS — G43709 Chronic migraine without aura, not intractable, without status migrainosus: Secondary | ICD-10-CM

## 2016-10-19 DIAGNOSIS — IMO0002 Reserved for concepts with insufficient information to code with codable children: Secondary | ICD-10-CM

## 2016-10-19 DIAGNOSIS — M542 Cervicalgia: Secondary | ICD-10-CM

## 2016-10-19 NOTE — Progress Notes (Addendum)
[facility]  PATIENT: Ruth Shah DOB: 1975/11/18   REASON FOR VISIT: Follow-up for migraine HISTORY FROM: Patient    HISTORY OF PRESENT ILLNESS: HISTORY 07/20/16 Ruth Shah is a 41 years old right-handed female, accompanied by her husband Ruth Shah, seen in refer by her primary care physician Dr. Selinda Shah for evaluation of bilateral hands paresthesia.  She had a history of depression, taking Zoloft 50 mg daily, reported under good control, ADHD, is taking Adderall XR 20 mg every morning since 2015, she works as Armed forces training and education officer, has 4 children. I saw her initially on August 25th 2017 for complains of bilateral hands numbness, electrodiagnostic study was normal, there was no evidence of bilateral upper extremity neuropathy or cervical radiculopathy.  Her hands has been bother her Since 2002, she reported previous normal EMG nerve conduction study too. Over the years, she has worsening bilateral hands paresthesia, involving all 5 fingers, radiating to to her forearm, elbow, since 2016, she also noticed neck pain, mild unbalanced gait, she has urinary urgency, she contributed to her fall vaginal delivery.  Also has a history of chronic migraine since 2007, used to have migraine once a month, since 2015, she has couple migraines each week, bilateral severe pounding headache with associated light noise sensitivity, trigger for her migraines are weather change, stress, sleep deprivation, hungry, bright light, strong smells, her headache lasting for hours to one day, she has been taking naproxen 500 mg every morning since March 2017 for her mild to moderate headaches is limited result,  She also complains of chronic neck pain, upper back pain, radiating pain to occipital region,  UPDATE Oct 26th 2017:YYShe could not tolerate nortriptyline, complains of early morning drowsiness, she has tried Imitrex 50 mg as needed, with mild improvement, she has migraine 2-3 times  each week, Complains constant neck pressure, taking naproxen 500 mg daily We have personally reviewed MRI cervical spine in October 2017, mild multilevel degenerative disc disease, most severe at C5-6 C6-7, no significant canal or foraminal stenosis. UPDATE 01/25/2018[name], 41 year old female returns for follow-up with history of migraine. She was originally of her referred to [place] for hand paresthesias however EMG nerve conduction was negative. She also has significant history of back pain, patchy skin off and on  blurry vision. She continues to eye with problems in her hands cramping. Cervical spine film shows mild multilevel degenerative disc disease most severe at C5-C6 7 no significant canal or foraminal stenosis. Patient request a copy of her MRI and EEG which were given. For her headaches she is currently taking Topamax 100 mg twice daily and Imitrex 100 mg daily which makes her headache tolerable she says tizanidine has not worked. She also failed nortriptyline. She takes Naprosyn for her chronic back pain, and Cymbalta.  She returns for reevaluation REVIEW OF SYSTEMS: Full 14 system review of systems performed and notable only for those listed, all others are neg:  Constitutional: Fatigue  Cardiovascular: neg Ear/Nose/Throat: neg  Skin: neg Eyes: Blurred vision Respiratory: neg Gastroitestinal: neg  Hematology/Lymphatic: neg  Endocrine: neg Musculoskeletal: Joint pain back pain Allergy/Immunology: neg Neurological: Headache Psychiatric: neg Sleep : Daytime sleepiness   ALLERGIES: Allergies  Allergen Reactions  . Sulfa Antibiotics Nausea Only  . Toradol [Ketorolac Tromethamine] Nausea Only  . Vicodin [Hydrocodone-Acetaminophen] Itching    HOME MEDICATIONS: Outpatient Medications Prior to Visit  Medication Sig Dispense Refill  . ADDERALL XR 20 MG 24 hr capsule Take 20 mg by mouth every morning.  0  .  Cholecalciferol (VITAMIN D-3) 1000 units CAPS Take by mouth daily.      . ondansetron (ZOFRAN) 4 MG tablet Take 1 tablet (4 mg total) by mouth every 8 (eight) hours as needed for nausea or vomiting. 20 tablet 6  . SUMAtriptan (IMITREX) 100 MG tablet Take 1 tablet (100 mg total) by mouth every 2 (two) hours as needed for migraine. May repeat in 2 hours if headache persists or recurs. 12 tablet 11  . tiZANidine (ZANAFLEX) 2 MG tablet Take 1 tablet (2 mg total) by mouth every 6 (six) hours as needed for muscle spasms. 30 tablet 6  . topiramate (TOPAMAX) 100 MG tablet Take 1 tablet (100 mg total) by mouth 2 (two) times daily. 60 tablet 11  . sertraline (ZOLOFT) 50 MG tablet Take 50 mg by mouth daily.  2   No facility-administered medications prior to visit.     PAST MEDICAL HISTORY: Past Medical History:  Diagnosis Date  . ADD (attention deficit disorder)   . Depression   . Fibromyalgia   . Herpes simplex disease   . Paresthesia of both hands     PAST SURGICAL HISTORY: Past Surgical History:  Procedure Laterality Date  . ABLATION    . CYST EXCISION    . HERNIA REPAIR    . TUBAL LIGATION      FAMILY HISTORY: Family History  Problem Relation Age of Onset  . Cancer Mother     skin  . Depression Mother   . Hypertension Mother   . Hyperlipidemia Mother   . Cancer Father     skin  . Arthritis Father   . Hypertension Father   . Hyperlipidemia Father   . Depression Maternal Grandmother   . Hypertension Maternal Grandmother   . Mental illness Maternal Grandmother   . Cancer Maternal Grandfather     kidney  . Hypertension Maternal Grandfather   . Kidney disease Maternal Grandfather   . Emphysema Paternal Grandmother   . Alzheimer's disease Paternal Grandmother   . Parkinson's disease Paternal Grandfather     SOCIAL HISTORY: Social History   Social History  . Marital status: Married    Spouse name: N/A  . Number of children: 4  . Years of education: Some College   Occupational History  . Not on file.   Social History Main Topics  .  Smoking status: Never Smoker  . Smokeless tobacco: Never Used  . Alcohol use No  . Drug use: No  . Sexual activity: Yes    Birth control/ protection: Surgical   Other Topics Concern  . Not on file   Social History Narrative   Lives at home with husband and children.   Right-handed.   4 cups caffeine per week.     PHYSICAL EXAM  Vitals:   10/19/16 1031  BP: (!) 141/89  Pulse: 90  Weight: 165 lb 3.2 oz (74.9 kg)  Height: 5\' 3"  (1.6 m)   Body mass index is 29.26 kg/m.  Generalized: Well developed, Obese female in no acute distress  Head: normocephalic and atraumatic,. Oropharynx benign  Neck: Supple, no carotid bruits  Cardiac: Regular rate rhythm, no murmur  Musculoskeletal: No deformity   Neurological examination   Mentation: Alert oriented to time, place, history taking. Attention span and concentration appropriate. Recent and remote memory intact.  Follows all commands speech and language fluent.   Cranial nerve II-XII: Visual acuity 20/30 bilaterally Pupils were equal round reactive to light extraocular movements were full, visual field were full  on confrontational test. Facial sensation and strength were normal. hearing was intact to finger rubbing bilaterally. Uvula tongue midline. head turning and shoulder shrug were normal and symmetric.Tongue protrusion into cheek strength was normal. Motor: normal bulk and tone, full strength in the BUE, BLE, fine finger movements normal, no pronator drift. No focal weakness Sensory: normal and symmetric to light touch, pinprick, and  Vibration, in the upper and lower extremities Coordination: finger-nose-finger, heel-to-shin bilaterally, no dysmetria Reflexes: Brachioradialis 2/2, biceps 2/2, triceps 2/2, patellar 2/2, Achilles 2/2, plantar responses were flexor bilaterally. Gait and Station: Rising up from seated position without assistance, normal stance,  moderate stride, good arm swing, smooth turning, able to perform tiptoe,  and heel walking without difficulty. Tandem gait is steady  DIAGNOSTIC DATA (LABS, IMAGING, TESTING) - I reviewed patient records, labs, notes, testing and imaging myself where available.  Lab Results  Component Value Date   WBC 8.6 06/06/2016   HGB 11.2 (L) 02/09/2011   HCT 41.4 06/06/2016   MCV 90 06/06/2016   PLT 322 06/06/2016      Component Value Date/Time   NA 141 06/06/2016 1053   K 4.5 06/06/2016 1053   CL 101 06/06/2016 1053   CO2 26 06/06/2016 1053   GLUCOSE 80 06/06/2016 1053   GLUCOSE 78 02/09/2011 0930   BUN 13 06/06/2016 1053   CREATININE 0.79 06/06/2016 1053   CALCIUM 9.4 06/06/2016 1053   PROT 6.9 06/06/2016 1053   ALBUMIN 4.5 06/06/2016 1053   AST 14 06/06/2016 1053   ALT 16 06/06/2016 1053   ALKPHOS 82 06/06/2016 1053   BILITOT 0.9 06/06/2016 1053   GFRNONAA 93 06/06/2016 1053   GFRAA 108 06/06/2016 1053    Lab Results  Component Value Date   VITAMINB12 332 06/06/2016   Lab Results  Component Value Date   TSH 1.880 06/06/2016      ASSESSMENT AND PLAN  41 y.o. year old female  has a past medical history of ADD (attention deficit disorder); Depression; Fibromyalgia; Chronic migraine headaches, neck pain with 4 extremity paresthesias with normal EEG nerve conduction study. MRI of cervical spine in October 2017 showed multilevel degenerative disc disease there was no significant canal or foraminal stenosis. She has failed nortriptyline in the past.The patient is a current patient of Dr. Terrace Arabia who is out of the office today . This note is sent to the work in doctor.                   PLAN: Given copy of EMG nerve conduction study Given copy of MRI of cervical spine Continue Topamax 100 mg twice daily Continue Imitrex 100 when necessary as needed Increase tizanidine to 4 mg at night to hopefully this will give you better control of your headaches Migraine tracker APP to keep record of headaches and bring to next appointment Given a list of foods that  are migraine triggers Follow-up in 3 months Greater than 50% of time during this 25 minute visit was spent on counseling, review of medical record explanation of diagnosis, planning of further management,discussion with  patient and  Coordination of care Nilda Riggs, [facility], [facility], APRN  [facility] [address] [place], [place] [address] [phone]  I reviewed the above note and documentation by the Nurse Practitioner and agree with the history, physical exam, assessment and plan as outlined above. I was immediately available for face-to-face consultation. Huston Foley, MD, PhD [facility])

## 2016-10-19 NOTE — Patient Instructions (Signed)
Given copy of EMG nerve conduction study Given copy of MRI of cervical spine Continue Topamax 100 mg twice daily Continue Imitrex 100 when necessary as needed Increase tizanidine to 4 mg at night Migraine tracker APP to keep record of headaches Follow-up in 3 months

## 2016-11-30 ENCOUNTER — Other Ambulatory Visit (HOSPITAL_COMMUNITY): Payer: Self-pay | Admitting: Physician Assistant

## 2016-11-30 ENCOUNTER — Encounter: Payer: Self-pay | Admitting: Internal Medicine

## 2016-11-30 DIAGNOSIS — Z09 Encounter for follow-up examination after completed treatment for conditions other than malignant neoplasm: Secondary | ICD-10-CM

## 2016-11-30 DIAGNOSIS — N6452 Nipple discharge: Secondary | ICD-10-CM

## 2016-12-04 ENCOUNTER — Other Ambulatory Visit (HOSPITAL_COMMUNITY): Payer: Self-pay | Admitting: Physician Assistant

## 2016-12-04 DIAGNOSIS — Z09 Encounter for follow-up examination after completed treatment for conditions other than malignant neoplasm: Secondary | ICD-10-CM

## 2016-12-21 ENCOUNTER — Ambulatory Visit (INDEPENDENT_AMBULATORY_CARE_PROVIDER_SITE_OTHER): Payer: Medicaid Other | Admitting: Gastroenterology

## 2016-12-21 ENCOUNTER — Other Ambulatory Visit: Payer: Self-pay

## 2016-12-21 ENCOUNTER — Encounter: Payer: Self-pay | Admitting: Gastroenterology

## 2016-12-21 VITALS — BP 122/86 | HR 75 | Temp 98.2°F | Ht 63.0 in | Wt 163.8 lb

## 2016-12-21 DIAGNOSIS — R1013 Epigastric pain: Secondary | ICD-10-CM | POA: Insufficient documentation

## 2016-12-21 DIAGNOSIS — R110 Nausea: Secondary | ICD-10-CM | POA: Diagnosis not present

## 2016-12-21 DIAGNOSIS — R1310 Dysphagia, unspecified: Secondary | ICD-10-CM | POA: Diagnosis not present

## 2016-12-21 MED ORDER — PANTOPRAZOLE SODIUM 40 MG PO TBEC: 40.0000 mg | DELAYED_RELEASE_TABLET | Freq: Every day | ORAL | 3 refills | Status: DC

## 2016-12-21 MED ORDER — HYDROCORTISONE 2.5 % RE CREA: 1.0000 "application " | TOPICAL_CREAM | Freq: Two times a day (BID) | RECTAL | 1 refills | Status: DC

## 2016-12-21 NOTE — Progress Notes (Addendum)
REVIEWED-NO ADDITIONAL RECOMMENDATIONS.  Primary Care Physician:  Rory Percy, MD Primary Gastroenterologist:  Dr. Oneida Alar   Chief Complaint  Patient presents with  . Nausea  . Abdominal Pain    RUQ  . Diarrhea  . Gastroesophageal Reflux    "sulfur burps"    HPI:   Ruth Shah is a 41 y.o. female presenting today at the request of Dr. Nadara Mustard secondary to multiple GI concerns to include nausea, abdominal pain, GERD, and diarrhea. Notes she has a chronic history of intermittent diarrhea related to dietary intake. Chronic nausea, intermittent. States when she was 40 or 16 she had an endoscopy and was told she had 3 ulcers but wasn't sure why she had ulcers. Notes chronic history of upset stomachs. Has had more frequent symptoms.   Predominant symptom is nausea. Will have pain in back, right-side of back. States she was TTP in epigastric area at time of office visit with PCP, almost to the point of tears. No pain after eating. Just feels mainly nauseated. Some days will not have diarrhea. Tries to follow the [employer]. Tries to avoid spicy foods. Stays away from pizza, [place], sauces, etc, as they flare up diarrhea. Lots of acid reflux. Will notice solid food dysphagia with bread at times. No PPI. Naproxen as needed. NO rectal bleeding. Stool is greasy.   States every 8 to 9 months she will have rectal discomfort after diarrhea, but this is not often. She tells me that she had an ultrasound of her abdomen with PCP, which we will obtain.   Past Medical History:  Diagnosis Date  . ADD (attention deficit disorder)   . Depression   . Fibromyalgia   . Herpes simplex disease   . Migraines   . Paresthesia of both hands     Past Surgical History:  Procedure Laterality Date  . ENDOMETRIAL ABLATION    . HERNIA REPAIR     inguinal as a baby   . left ovarian removal due to cyst    . TUBAL LIGATION      Current Outpatient Prescriptions  Medication Sig Dispense Refill  . ADDERALL XR  20 MG 24 hr capsule Take 20 mg by mouth every morning.  0  . Cholecalciferol (VITAMIN D-3) 1000 units CAPS Take by mouth daily.    . DULoxetine (CYMBALTA) 60 MG capsule Take 60 mg by mouth daily.    . naproxen (NAPROSYN) 500 MG tablet Take 500 mg by mouth as needed.    . ondansetron (ZOFRAN) 4 MG tablet Take 1 tablet (4 mg total) by mouth every 8 (eight) hours as needed for nausea or vomiting. 20 tablet 6  . SUMAtriptan (IMITREX) 100 MG tablet Take 1 tablet (100 mg total) by mouth every 2 (two) hours as needed for migraine. May repeat in 2 hours if headache persists or recurs. 12 tablet 11  . tiZANidine (ZANAFLEX) 4 MG tablet Take 4 mg by mouth every 6 (six) hours as needed for muscle spasms.    Marland Kitchen topiramate (TOPAMAX) 100 MG tablet Take 1 tablet (100 mg total) by mouth 2 (two) times daily. 60 tablet 11   No current facility-administered medications for this visit.     Allergies as of 12/21/2016 - Review Complete 12/21/2016  Allergen Reaction Noted  . Sulfa antibiotics Nausea Only 07/20/2016  . Toradol [ketorolac tromethamine] Nausea Only 07/20/2016  . Vicodin [hydrocodone-acetaminophen] Itching 08/12/2015    Family History  Problem Relation Age of Onset  . Cancer Mother  skin  . Depression Mother   . Hypertension Mother   . Hyperlipidemia Mother   . Cancer Father     skin  . Arthritis Father   . Hypertension Father   . Hyperlipidemia Father   . Depression Maternal Grandmother   . Hypertension Maternal Grandmother   . Mental illness Maternal Grandmother   . Cancer Maternal Grandfather     kidney  . Hypertension Maternal Grandfather   . Kidney disease Maternal Grandfather   . Emphysema Paternal Grandmother   . Alzheimer's disease Paternal Grandmother   . Parkinson's disease Paternal Grandfather   . Colon cancer Neg Hx   . Colon polyps Neg Hx     Social History   Social History  . Marital status: Married    Spouse name: N/A  . Number of children: 4  . Years of  education: Some College   Occupational History  . Not on file.   Social History Main Topics  . Smoking status: Never Smoker  . Smokeless tobacco: Never Used  . Alcohol use No  . Drug use: No  . Sexual activity: Yes    Birth control/ protection: Surgical   Other Topics Concern  . Not on file   Social History Narrative   Lives at home with husband and children.   Right-handed.   4 cups caffeine per week.    Review of Systems: As mentioned in HPI   Physical Exam: BP 122/86   Pulse 75   Temp 98.2 F (36.8 C) (Oral)   Ht _0  (1.6 m)   Wt 163 lb 12.8 oz (74.3 kg)   BMI 29.02 kg/m  General:   Alert and oriented. Pleasant and cooperative. Well-nourished and well-developed.  Head:  Normocephalic and atraumatic. Eyes:  Without icterus, sclera clear and conjunctiva pink.  Ears:  Normal auditory acuity. Nose:  No deformity, discharge,  or lesions. Mouth:  No deformity or lesions, oral mucosa pink.  Lungs:  Clear to auscultation bilaterally. No wheezes, rales, or rhonchi. No distress.  Heart:  S1, S2 present without murmurs appreciated.  Abdomen:  +BS, soft, TTP epigastric and non-distended. No HSM noted. No guarding or rebound. No masses appreciated.  Rectal:  Deferred  Msk:  Symmetrical without gross deformities. Normal posture. Extremities:  Without edema. Neurologic:  Alert and  oriented x4;  grossly normal neurologically. Psych:  Alert and cooperative. Normal mood and affect.  Outside labs Feb 2018: amylase and lipase normal. Hgb 12.8. LFTS: Tbili 0.8, Alk Phos 71, AST 13, ALT 14. H.pylori serology negative.

## 2016-12-21 NOTE — Patient Instructions (Signed)
We have scheduled you for an upper endoscopy with dilation with Dr. Darrick PennaFields.  Start taking Protonix once each morning, 30 minutes before breakfast. I sent this to the pharmacy.  I sent in Anusol to use twice a day per rectum for 7 days as needed for any rectal discomfort. If this worsens, let me know.  I will get the ultrasound results.  We will see you in 2 months!

## 2016-12-23 NOTE — Assessment & Plan Note (Signed)
41 year old female with history of chronic, intermittent nausea, and reportedly had PUD in her teens (states she underwent an EGD at that time, no records available). She is unclear why and does not believe it was NSAID related. Now with worsening nausea, pain in right-sided back (amylase/lipase normal), gallbladder remains in situ and we are requesting US abdomen results. Significant GERD but not on a PPI. Solid food dysphagia intermittently. Will proceed with EGD +/- dilation, add PPI, and obtain US results. Discussed avoidance of Naproxen if at all possible, although she states she does not take this often.  Proceed with upper endoscopy/dilation in the near future with Dr. Darrick Penna. The risks, benefits, and alternatives have been discussed in detail with patient. They have stated understanding and desire to proceed.  PROPOFOL due to polypharmacy Add Protonix once daily Limit/avoid Naproxen and other NSAIDs Return in 2 months

## 2016-12-23 NOTE — Assessment & Plan Note (Signed)
EGD with dilation as appropriate. Adding PPI today. Likely uncontrolled GERD as culprit.

## 2016-12-25 ENCOUNTER — Encounter (HOSPITAL_COMMUNITY): Payer: Self-pay

## 2016-12-25 ENCOUNTER — Encounter (HOSPITAL_COMMUNITY)
Admission: RE | Admit: 2016-12-25 | Discharge: 2016-12-25 | Disposition: A | Discharge status: Home / Self Care | Payer: Medicaid Other | Source: Ambulatory Visit | Attending: Gastroenterology | Admitting: Gastroenterology

## 2016-12-25 DIAGNOSIS — R1310 Dysphagia, unspecified: Secondary | ICD-10-CM | POA: Diagnosis present

## 2016-12-25 DIAGNOSIS — R1013 Epigastric pain: Secondary | ICD-10-CM | POA: Diagnosis not present

## 2016-12-25 HISTORY — DX: Other specified postprocedural states: Z98.890

## 2016-12-25 HISTORY — DX: Unspecified osteoarthritis, unspecified site: M19.90

## 2016-12-25 HISTORY — DX: Other specified postprocedural states: R11.2

## 2016-12-25 LAB — CBC
HCT: 40.8 % (ref 36.0–46.0)
Hemoglobin: 13.5 g/dL (ref 12.0–15.0)
MCH: 30.5 pg (ref 26.0–34.0)
MCHC: 33.1 g/dL (ref 30.0–36.0)
MCV: 92.1 fL (ref 78.0–100.0)
PLATELETS: 265 10*3/uL (ref 150–400)
RBC: 4.43 MIL/uL (ref 3.87–5.11)
RDW: 12.9 % (ref 11.5–15.5)
WBC: 8.1 10*3/uL (ref 4.0–10.5)

## 2016-12-25 LAB — BASIC METABOLIC PANEL
Anion gap: 8 (ref 5–15)
BUN: 12 mg/dL (ref 6–20)
CHLORIDE: 106 mmol/L (ref 101–111)
CO2: 26 mmol/L (ref 22–32)
CREATININE: 0.8 mg/dL (ref 0.44–1.00)
Calcium: 9.1 mg/dL (ref 8.9–10.3)
GFR calc Af Amer: >60 mL/min (ref >60)
GFR calc non Af Amer: >60 mL/min (ref >60)
GLUCOSE: 80 mg/dL (ref 65–99)
POTASSIUM: 3.6 mmol/L (ref 3.5–5.1)
SODIUM: 140 mmol/L (ref 135–145)

## 2016-12-25 LAB — HCG, SERUM, QUALITATIVE: PREG SERUM: NEGATIVE

## 2016-12-25 NOTE — Progress Notes (Signed)
cc'd to pcp 

## 2016-12-25 NOTE — Progress Notes (Signed)
PT is aware.

## 2016-12-25 NOTE — Patient Instructions (Signed)
Ruth Shah  12/25/2016     @   Your procedure is scheduled on 12/26/2016.  Report to [facility] at 6:15 A.M.  Call this number if you have problems the morning of surgery:  [phone]   Remember:  Do not eat food or drink liquids after midnight.  Take these medicines the morning of surgery with A SIP OF WATER Adderal, Flexeril if needed, Cymbalta, Zofran if needed, Protonix, Imitrex if needed  Topamax   Do not wear jewelry, make-up or nail polish.  Do not wear lotions, powders, or perfumes, or deoderant.  Do not shave 48 hours prior to surgery.  Men may shave face and neck.  Do not bring valuables to the hospital.  [facility] is not responsible for any belongings or valuables.  Contacts, dentures or bridgework may not be worn into surgery.  Leave your suitcase in the car.  After surgery it may be brought to your room.  For patients admitted to the hospital, discharge time will be determined by your treatment team.  Patients discharged the day of surgery will not be allowed to drive home.   Please read over the following fact sheets that you were given. Anesthesia Post-op Instructions     PATIENT INSTRUCTIONS POST-ANESTHESIA  IMMEDIATELY FOLLOWING SURGERY:  Do not drive or operate machinery for the first twenty four hours after surgery.  Do not make any important decisions for twenty four hours after surgery or while taking narcotic pain medications or sedatives.  If you develop intractable nausea and vomiting or a severe headache please notify your doctor immediately.  FOLLOW-UP:  Please make an appointment with your surgeon as instructed. You do not need to follow up with anesthesia unless specifically instructed to do so.  WOUND CARE INSTRUCTIONS (if applicable):  Keep a dry clean dressing on the anesthesia/puncture wound site if there is drainage.  Once the wound has quit draining you may leave it open to air.  Generally you should leave the  bandage intact for twenty four hours unless there is drainage.  If the epidural site drains for more than 36-48 hours please call the anesthesia department.  QUESTIONS?:  Please feel free to call your physician or the hospital operator if you have any questions, and they will be happy to assist you.      Esophageal Dilatation Esophageal dilatation is a procedure to open a blocked or narrowed part of the esophagus. The esophagus is the long tube in your throat that carries food and liquid from your mouth to your stomach. The procedure is also called esophageal dilation. You may need this procedure if you have a buildup of scar tissue in your esophagus that makes it difficult, painful, or even impossible to swallow. This can be caused by gastroesophageal reflux disease (GERD). In rare cases, people need this procedure because they have cancer of the esophagus or a problem with the way food moves through the esophagus. Sometimes you may need to have another dilatation to enlarge the opening of the esophagus gradually. Tell a health care provider about:  Any allergies you have.  All medicines you are taking, including vitamins, herbs, eye drops, creams, and over-the-counter medicines.  Any problems you or family members have had with anesthetic medicines.  Any blood disorders you have.  Any surgeries you have had.  Any medical conditions you have.  Any antibiotic medicines you are required to take before dental procedures. What are the risks? Generally, this is a safe procedure.  However, problems can occur and include:  Bleeding from a tear in the lining of the esophagus.  A hole (perforation) in the esophagus. What happens before the procedure?  Do not eat or drink anything after midnight on the night before the procedure or as directed by your health care provider.  Ask your health care provider about changing or stopping your regular medicines. This is especially important if you are  taking diabetes medicines or blood thinners.  Plan to have someone take you home after the procedure. What happens during the procedure?  You will be given a medicine that makes you relaxed and sleepy (sedative).  A medicine may be sprayed or gargled to numb the back of the throat.  Your health care provider can use various instruments to do an esophageal dilatation. During the procedure, the instrument used will be placed in your mouth and passed down into your esophagus. Options include:  Simple dilators. This instrument is carefully placed in the esophagus to stretch it.  Guided wire bougies. In this method, a flexible tube (endoscope) is used to insert a wire into the esophagus. The dilator is passed over this wire to enlarge the esophagus. Then the wire is removed.  Balloon dilators. An endoscope with a small balloon at the end is passed down into the esophagus. Inflating the balloon gently stretches the esophagus and opens it up. What happens after the procedure?  Your blood pressure, heart rate, breathing rate, and blood oxygen level will be monitored often until the medicines you were given have worn off.  Your throat may feel slightly sore and will probably still feel numb. This will improve slowly over time.  You will not be allowed to eat or drink until the throat numbness has resolved.  If this is a same-day procedure, you may be allowed to go home once you have been able to drink, urinate, and sit on the edge of the bed without nausea or dizziness.  If this is a same-day procedure, you should have a friend or family member with you for the next 24 hours after the procedure. This information is not intended to replace advice given to you by your health care provider. Make sure you discuss any questions you have with your health care provider. Document Released: 11/02/2005 Document Revised: 02/17/2016 Document Reviewed: 01/21/2014 Elsevier Interactive Patient Education  2017  [employer] Esophagogastroduodenoscopy Esophagogastroduodenoscopy (EGD) is a procedure to examine the lining of the esophagus, stomach, and first part of the small intestine (duodenum). This procedure is done to check for problems such as inflammation, bleeding, ulcers, or growths. During this procedure, a long, flexible, lighted tube with a camera attached (endoscope) is inserted down the throat. Tell a health care provider about:  Any allergies you have.  All medicines you are taking, including vitamins, herbs, eye drops, creams, and over-the-counter medicines.  Any problems you or family members have had with anesthetic medicines.  Any blood disorders you have.  Any surgeries you have had.  Any medical conditions you have.  Whether you are pregnant or may be pregnant. What are the risks? Generally, this is a safe procedure. However, problems may occur, including:  Infection.  Bleeding.  A tear (perforation) in the esophagus, stomach, or duodenum.  Trouble breathing.  Excessive sweating.  Spasms of the larynx.  A slowed heartbeat.  Low blood pressure. What happens before the procedure?  Follow instructions from your health care provider about eating or drinking restrictions.  Ask your health care provider  about:  Changing or stopping your regular medicines. This is especially important if you are taking diabetes medicines or blood thinners.  Taking medicines such as aspirin and ibuprofen. These medicines can thin your blood. Do not take these medicines before your procedure if your health care provider instructs you not to.  Plan to have someone take you home after the procedure.  If you wear dentures, be ready to remove them before the procedure. What happens during the procedure?  To reduce your risk of infection, your health care team will wash or sanitize their hands.  An IV tube will be put in a vein in your hand or arm. You will get medicines and fluids  through this tube.  You will be given one or more of the following:  A medicine to help you relax (sedative).  A medicine to numb the area (local anesthetic). This medicine may be sprayed into your throat. It will make you feel more comfortable and keep you from gagging or coughing during the procedure.  A medicine for pain.  A mouth guard may be placed in your mouth to protect your teeth and to keep you from biting on the endoscope.  You will be asked to lie on your left side.  The endoscope will be lowered down your throat into your esophagus, stomach, and duodenum.  Air will be put into the endoscope. This will help your health care provider see better.  The lining of your esophagus, stomach, and duodenum will be examined.  Your health care provider may:  Take a tissue sample so it can be looked at in a lab (biopsy).  Remove growths.  Remove objects (foreign bodies) that are stuck.  Treat any bleeding with medicines or other devices that stop tissue from bleeding.  Widen (dilate) or stretch narrowed areas of your esophagus and stomach.  The endoscope will be taken out. The procedure may vary among health care providers and hospitals. What happens after the procedure?  Your blood pressure, heart rate, breathing rate, and blood oxygen level will be monitored often until the medicines you were given have worn off.  Do not eat or drink anything until the numbing medicine has worn off and your gag reflex has returned. This information is not intended to replace advice given to you by your health care provider. Make sure you discuss any questions you have with your health care provider. Document Released: 01/12/2005 Document Revised: 02/17/2016 Document Reviewed: 08/05/2015 Elsevier Interactive Patient Education  2017 [employer].

## 2016-12-26 ENCOUNTER — Ambulatory Visit (HOSPITAL_COMMUNITY): Payer: Medicaid Other | Admitting: Anesthesiology

## 2016-12-26 ENCOUNTER — Ambulatory Visit (HOSPITAL_COMMUNITY)
Admission: RE | Admit: 2016-12-26 | Discharge: 2016-12-26 | Disposition: A | Discharge status: Home / Self Care | Payer: Medicaid Other | Source: Ambulatory Visit | Attending: Gastroenterology | Admitting: Gastroenterology

## 2016-12-26 ENCOUNTER — Encounter (HOSPITAL_COMMUNITY)
Admission: RE | Disposition: A | Discharge status: Home / Self Care | Payer: Self-pay | Source: Ambulatory Visit | Attending: Gastroenterology

## 2016-12-26 ENCOUNTER — Encounter (HOSPITAL_COMMUNITY): Payer: Self-pay | Admitting: *Deleted

## 2016-12-26 DIAGNOSIS — R1013 Epigastric pain: Secondary | ICD-10-CM | POA: Diagnosis not present

## 2016-12-26 DIAGNOSIS — Z8371 Family history of colonic polyps: Secondary | ICD-10-CM | POA: Insufficient documentation

## 2016-12-26 DIAGNOSIS — R197 Diarrhea, unspecified: Secondary | ICD-10-CM | POA: Diagnosis not present

## 2016-12-26 DIAGNOSIS — R1310 Dysphagia, unspecified: Secondary | ICD-10-CM | POA: Diagnosis not present

## 2016-12-26 DIAGNOSIS — M1990 Unspecified osteoarthritis, unspecified site: Secondary | ICD-10-CM | POA: Diagnosis not present

## 2016-12-26 DIAGNOSIS — Z882 Allergy status to sulfonamides status: Secondary | ICD-10-CM | POA: Diagnosis not present

## 2016-12-26 DIAGNOSIS — Z881 Allergy status to other antibiotic agents status: Secondary | ICD-10-CM | POA: Insufficient documentation

## 2016-12-26 DIAGNOSIS — Z885 Allergy status to narcotic agent status: Secondary | ICD-10-CM | POA: Insufficient documentation

## 2016-12-26 DIAGNOSIS — K222 Esophageal obstruction: Secondary | ICD-10-CM | POA: Insufficient documentation

## 2016-12-26 DIAGNOSIS — K317 Polyp of stomach and duodenum: Secondary | ICD-10-CM | POA: Diagnosis not present

## 2016-12-26 DIAGNOSIS — F329 Major depressive disorder, single episode, unspecified: Secondary | ICD-10-CM | POA: Insufficient documentation

## 2016-12-26 DIAGNOSIS — K449 Diaphragmatic hernia without obstruction or gangrene: Secondary | ICD-10-CM | POA: Insufficient documentation

## 2016-12-26 DIAGNOSIS — K2950 Unspecified chronic gastritis without bleeding: Secondary | ICD-10-CM | POA: Insufficient documentation

## 2016-12-26 DIAGNOSIS — M797 Fibromyalgia: Secondary | ICD-10-CM | POA: Diagnosis not present

## 2016-12-26 DIAGNOSIS — K219 Gastro-esophageal reflux disease without esophagitis: Secondary | ICD-10-CM | POA: Insufficient documentation

## 2016-12-26 HISTORY — PX: ESOPHAGOGASTRODUODENOSCOPY (EGD) WITH PROPOFOL: SHX5813

## 2016-12-26 SURGERY — ESOPHAGOGASTRODUODENOSCOPY (EGD) WITH PROPOFOL
Anesthesia: Monitor Anesthesia Care

## 2016-12-26 MED ORDER — LIDOCAINE HCL (PF) 1 % IJ SOLN
INTRAMUSCULAR | Status: AC
  Filled 2016-12-26: qty 5

## 2016-12-26 MED ORDER — LIDOCAINE HCL (CARDIAC) 10 MG/ML IV SOLN
INTRAVENOUS | Status: DC | PRN
  Administered 2016-12-26: 40 mg via INTRAVENOUS

## 2016-12-26 MED ORDER — LIDOCAINE VISCOUS 2 % MT SOLN
OROMUCOSAL | Status: AC
  Filled 2016-12-26: qty 15

## 2016-12-26 MED ORDER — DEXAMETHASONE SODIUM PHOSPHATE 4 MG/ML IJ SOLN
4.0000 mg | INTRAMUSCULAR | Status: AC
  Administered 2016-12-26: 4 mg via INTRAVENOUS
  Filled 2016-12-26: qty 1

## 2016-12-26 MED ORDER — ONDANSETRON HCL 4 MG/2ML IJ SOLN
INTRAMUSCULAR | Status: AC
  Filled 2016-12-26: qty 2

## 2016-12-26 MED ORDER — FENTANYL CITRATE (PF) 100 MCG/2ML IJ SOLN
INTRAMUSCULAR | Status: AC
  Filled 2016-12-26: qty 2

## 2016-12-26 MED ORDER — PROPOFOL 10 MG/ML IV BOLUS
INTRAVENOUS | Status: AC
  Filled 2016-12-26: qty 20

## 2016-12-26 MED ORDER — PROPOFOL 500 MG/50ML IV EMUL
INTRAVENOUS | Status: DC | PRN
  Administered 2016-12-26: 150 ug/kg/min via INTRAVENOUS

## 2016-12-26 MED ORDER — MIDAZOLAM HCL 2 MG/2ML IJ SOLN
1.0000 mg | INTRAMUSCULAR | Status: AC
  Administered 2016-12-26 (×2): 2 mg via INTRAVENOUS
  Filled 2016-12-26: qty 2

## 2016-12-26 MED ORDER — MIDAZOLAM HCL 2 MG/2ML IJ SOLN
INTRAMUSCULAR | Status: AC
  Filled 2016-12-26: qty 2

## 2016-12-26 MED ORDER — FENTANYL CITRATE (PF) 100 MCG/2ML IJ SOLN
25.0000 ug | INTRAMUSCULAR | Status: AC
  Administered 2016-12-26: 25 ug via INTRAVENOUS

## 2016-12-26 MED ORDER — LACTATED RINGERS IV SOLN
INTRAVENOUS | Status: DC
  Administered 2016-12-26: 1000 mL via INTRAVENOUS

## 2016-12-26 MED ORDER — CHLORHEXIDINE GLUCONATE CLOTH 2 % EX PADS: 6.0000 | MEDICATED_PAD | Freq: Once | CUTANEOUS | Status: DC

## 2016-12-26 MED ORDER — PROPOFOL 10 MG/ML IV BOLUS
INTRAVENOUS | Status: AC
  Filled 2016-12-26: qty 40

## 2016-12-26 MED ORDER — LIDOCAINE VISCOUS 2 % MT SOLN
5.0000 mL | Freq: Two times a day (BID) | OROMUCOSAL | Status: AC
  Administered 2016-12-26 (×2): 5 mL via OROMUCOSAL

## 2016-12-26 MED ORDER — ONDANSETRON HCL 4 MG/2ML IJ SOLN
4.0000 mg | Freq: Once | INTRAMUSCULAR | Status: AC
  Administered 2016-12-26: 4 mg via INTRAVENOUS

## 2016-12-26 NOTE — H&P (Signed)
Primary Care Physician:  Selinda Flavin, MD Primary Gastroenterologist:  Dr. Darrick Penna  Pre-Procedure History & Physical: HPI:  Ruth Shah is a 41 y.o. female here for DYSPEPSIA.  Past Medical History:  Diagnosis Date  . ADD (attention deficit disorder)   . Arthritis   . Depression   . Fibromyalgia   . Herpes simplex disease   . Migraines   . Paresthesia of both hands   . PONV (postoperative nausea and vomiting)     Past Surgical History:  Procedure Laterality Date  . ENDOMETRIAL ABLATION    . HERNIA REPAIR     inguinal as a baby   . left ovarian removal due to cyst    . PILONIDAL CYST / SINUS EXCISION  2009  . TUBAL LIGATION      Prior to Admission medications   Medication Sig Start Date End Date Taking? Authorizing Provider  ADDERALL XR 20 MG 24 hr capsule Take 20 mg by mouth every morning. 05/03/16  Yes Historical Provider, MD  cyclobenzaprine (FLEXERIL) 10 MG tablet Take 10 mg by mouth daily as needed for muscle spasms.   Yes Historical Provider, MD  DULoxetine (CYMBALTA) 60 MG capsule Take 60 mg by mouth every evening.    Yes Historical Provider, MD  topiramate (TOPAMAX) 100 MG tablet Take 1 tablet (100 mg total) by mouth 2 (two) times daily. Patient taking differently: Take 100 mg by mouth daily.  07/20/16  Yes Levert Feinstein, MD  Cholecalciferol (VITAMIN D-3) 1000 units CAPS Take 1,000 Units by mouth daily.     Historical Provider, MD  hydrocortisone (ANUSOL-HC) 2.5 % rectal cream Place 1 application rectally 2 (two) times daily. Patient not taking: Reported on 12/21/2016 12/21/16   Gelene Mink, NP  naproxen (NAPROSYN) 500 MG tablet Take 500 mg by mouth daily as needed for mild pain.     Historical Provider, MD  ondansetron (ZOFRAN) 4 MG tablet Take 1 tablet (4 mg total) by mouth every 8 (eight) hours as needed for nausea or vomiting. 06/06/16   Levert Feinstein, MD  pantoprazole (PROTONIX) 40 MG tablet Take 1 tablet (40 mg total) by mouth daily. Take 30 minutes before  breakfast Patient not taking: Reported on 12/21/2016 12/21/16   Gelene Mink, NP  SUMAtriptan (IMITREX) 100 MG tablet Take 1 tablet (100 mg total) by mouth every 2 (two) hours as needed for migraine. May repeat in 2 hours if headache persists or recurs. 07/20/16   Levert Feinstein, MD    Allergies as of 12/21/2016 - Review Complete 12/21/2016  Allergen Reaction Noted  . Sulfa antibiotics Nausea Only 07/20/2016  . Toradol [ketorolac tromethamine] Nausea Only 07/20/2016  . Vicodin [hydrocodone-acetaminophen] Itching 08/12/2015    Family History  Problem Relation Age of Onset  . Cancer Mother     skin  . Depression Mother   . Hypertension Mother   . Hyperlipidemia Mother   . Cancer Father     skin  . Arthritis Father   . Hypertension Father   . Hyperlipidemia Father   . Depression Maternal Grandmother   . Hypertension Maternal Grandmother   . Mental illness Maternal Grandmother   . Cancer Maternal Grandfather     kidney  . Hypertension Maternal Grandfather   . Kidney disease Maternal Grandfather   . Emphysema Paternal Grandmother   . Alzheimer's disease Paternal Grandmother   . Parkinson's disease Paternal Grandfather   . Colon cancer Neg Hx   . Colon polyps Neg Hx     Social  History   Social History  . Marital status: Married    Spouse name: N/A  . Number of children: 4  . Years of education: Some College   Occupational History  . Not on file.   Social History Main Topics  . Smoking status: Never Smoker  . Smokeless tobacco: Never Used  . Alcohol use No  . Drug use: No  . Sexual activity: Yes    Birth control/ protection: Surgical   Other Topics Concern  . Not on file   Social History Narrative   Lives at home with husband and children.   Right-handed.   4 cups caffeine per week.    Review of Systems: See HPI, otherwise negative ROS   Physical Exam: BP 136/87   Pulse 80   Temp 98.4 F (36.9 C) (Oral)   Resp 18   Ht  (1.6 m)   Wt 166 lb (75.3 kg)    SpO2 99%   BMI 29.41 kg/m  General:   Alert,  pleasant and cooperative in NAD Head:  Normocephalic and atraumatic. Neck:  Supple; Lungs:  Clear throughout to auscultation.    Heart:  Regular rate and rhythm. Abdomen:  Soft, nontender and nondistended. Normal bowel sounds, without guarding, and without rebound.   Neurologic:  Alert and  oriented x4;  grossly normal neurologically.  Impression/Plan:     DYSPEPSIA  PLAN:  EGD TODAY. DISCUSSED PROCEDURE, BENEFITS, & RISKS: < 1% chance of medication reaction, bleeding, or perforation.

## 2016-12-26 NOTE — Op Note (Signed)
[facility] Patient Name: Ruth Shah Procedure Date: 12/26/2016 7:07 AM MRN: [id number] Date of Birth: 1976-07-24 Attending MD: Jonette Eva , MD CSN: [id number] Age: 41 Admit Type: Outpatient Procedure:                Upper GI endoscopy WITH COLD FORCEPS BIOPSY Indications:              Dyspepsia(NAUSEA), OCCASIONAL Dysphagia,                            Diarrhea-STARTED AFTER ZOLOFT CHANGED TO CYMBALTA.                            SEEN IN OFC WITH HEARTBURN BUT NOT ON A PPI.                            PROTONIX ADDED. Providers:                Jonette Eva, MD, Criselda Peaches. Patsy Lager, RN, Dyann Ruddle Referring MD:             Selinda Flavin, MD Medicines:                Propofol per Anesthesia Complications:            No immediate complications. Estimated Blood Loss:     Estimated blood loss was minimal. Procedure:                Pre-Anesthesia Assessment:                           - Prior to the procedure, a History and Physical                            was performed, and patient medications and                            allergies were reviewed. The patient's tolerance of                            previous anesthesia was also reviewed. The risks                            and benefits of the procedure and the sedation                            options and risks were discussed with the patient.                            All questions were answered, and informed consent                            was obtained. Prior Anticoagulants: The patient has  taken naproxen. ASA Grade Assessment: II - A                            patient with mild systemic disease. After reviewing                            the risks and benefits, the patient was deemed in                            satisfactory condition to undergo the procedure.                            After obtaining informed consent, the endoscope was   passed under direct vision. Throughout the                            procedure, the patient's blood pressure, pulse, and                            oxygen saturations were monitored continuously. The                            [id number] ([id number]) scope was introduced through the                            mouth, and advanced to the second part of duodenum.                            The upper GI endoscopy was somewhat difficult due                            to the patient's agitation. Successful completion                            of the procedure was aided by increasing the dose                            of sedation medication. The patient tolerated the                            procedure fairly well. Scope In: 7:40:59 AM Scope Out: 7:52:10 AM Total Procedure Duration: 0 hours 11 minutes 11 seconds  Findings:      One benign-appearing, intrinsic stenosis was found. WIDELY PATENT-       measured 114-15 MM IN DIAMETER and was traversed.      A small hiatal hernia was present.      A few small sessile polyps were found in the gastric body. This was       biopsied with a cold forceps for histology.      Patchy mild inflammation characterized by congestion (edema), erythema       and friability was found in the gastric antrum. Biopsies were taken with       a cold forceps for Helicobacter pylori testing.      The examined duodenum was normal.  Biopsies for histology were taken with       a cold forceps for evaluation of celiac disease. Impression:               - OCCASIONAL DYSPHAGIA DUE TO UNCONTROLLED                            GERD/PEPTIC STRICTURE                           - Small hiatal hernia.                           - A few gastric polyps.                           - MILD Gastritis.                           - NO SOURCE FOR DIARRHEA IDENTIFIED-MOST LIKELY DUE                            TO CYMBALTA Moderate Sedation:      Per Anesthesia Care Recommendation:           - Await  pathology results.                           - Low fat diet. AVOID REFLUX TRIGGERS.                           - Continue present medications. PROTONIX 30 MINS                            PRIOR TO FIRST MEAL EVERY DAY FOREVER.                           - Return to my office in 4 months.                           - Patient has a contact number available for                            emergencies. The signs and symptoms of potential                            delayed complications were discussed with the                            patient. Return to normal activities tomorrow.                            Written discharge instructions were provided to the                            patient. Procedure Code(s):        --- Professional ---  40981, Esophagogastroduodenoscopy, flexible,                            transoral; with biopsy, single or multiple Diagnosis Code(s):        --- Professional ---                           K22.2, Esophageal obstruction                           K44.9, Diaphragmatic hernia without obstruction or                            gangrene                           K31.7, Polyp of stomach and duodenum                           K29.70, Gastritis, unspecified, without bleeding                           R10.13, Epigastric pain                           R13.10, Dysphagia, unspecified CPT copyright 2016 American Medical Association. All rights reserved. The codes documented in this report are preliminary and upon coder review may  be revised to meet current compliance requirements. Jonette Eva, MD Jonette Eva, MD 12/26/2016 8:14:55 AM This report has been signed electronically. Number of Addenda: 0

## 2016-12-26 NOTE — Anesthesia Procedure Notes (Signed)
Procedure Name: MAC Date/Time: 12/26/2016 7:26 AM Performed by: Andree Elk, Sharifa Bucholz A Pre-anesthesia Checklist: Patient identified, Emergency Drugs available, Suction available, Patient being monitored and Timeout performed Oxygen Delivery Method: Simple face mask

## 2016-12-26 NOTE — Transfer of Care (Signed)
Immediate Anesthesia Transfer of Care Note  Patient: Ruth Shah  Procedure(s) Performed: Procedure(s) with comments: ESOPHAGOGASTRODUODENOSCOPY (EGD) WITH PROPOFOL (N/A) - 730   Patient Location: PACU  Anesthesia Type:MAC  Level of Consciousness: awake, alert , oriented and patient cooperative  Airway & Oxygen Therapy: Patient Spontanous Breathing and Patient connected to nasal cannula oxygen  Post-op Assessment: Report given to RN and Post -op Vital signs reviewed and stable  Post vital signs: Reviewed and stable  Last Vitals:  Vitals:   12/26/16 0715 12/26/16 0720  BP: 127/77 122/74  Pulse:    Resp: 16 (!) 22  Temp:      Last Pain:  Vitals:   12/26/16 0649  TempSrc: Oral  PainSc: 4       Patients Stated Pain Goal: 9 (81/77/11 6579)  Complications: No apparent anesthesia complications

## 2016-12-26 NOTE — Anesthesia Postprocedure Evaluation (Signed)
Anesthesia Post Note  Patient: Ruth Shah  Procedure(s) Performed: Procedure(s) (LRB): ESOPHAGOGASTRODUODENOSCOPY (EGD) WITH PROPOFOL (N/A)  Patient location during evaluation: PACU Anesthesia Type: MAC Level of consciousness: awake and alert and oriented Pain management: pain level controlled Vital Signs Assessment: post-procedure vital signs reviewed and stable Respiratory status: spontaneous breathing Cardiovascular status: stable Postop Assessment: no signs of nausea or vomiting Anesthetic complications: no     Last Vitals:  Vitals:   12/26/16 0715 12/26/16 0720  BP: 127/77 122/74  Pulse:    Resp: 16 (!) 22  Temp:      Last Pain:  Vitals:   12/26/16 0649  TempSrc: Oral  PainSc: 4                  Michelle Wnek A

## 2016-12-26 NOTE — Discharge Instructions (Signed)
YOUR NAUSEA AND OCCASIONAL TROUBLE SWALLOWING SOLIDS IS MOST LIKELY DUE TO REFLUX. YOUR DIARRHEA IS MOST LIKELY DUE TO CYMBALTA. You did have an esophageal stricture from acid reflux but it was wide open. You have mild gastritis. I biopsied your stomach and small bowel.   CONTINUE YOUR WEIGHT LOSS EFFORTS. LOSE 10 LBS IT WILL HELP CONTROL REFLUX.  DRINK WATER TO KEEP YOUR URINE LIGHT YELLOW.  AVOID TRIGGERS FOR REFLUX. SEE INFO BELOW.  FOLLOW A LOW FAT DIET. MEATS SHOULD BE BAKED, BROILED, OR BOILED. AVOID FRIED FOODS. SEE INFO BELOW.  YOUR BIOPSY RESULTS WILL BE AVAILABLE IN MY CHART AFTER APR 6 AND MY OFFICE WILL CONTACT YOU IN 10-14 DAYS WITH YOUR RESULTS.   IF NO SOURCE OF NAUSEA AND DIARRHEA ARE IDENTIFIED, YOU SHOULD CONSIDER TAPERING OFF CYMBALTA AND GOING BACK ON ZOLOFT.  Please  CALL IN one month IF SYMPTOMS ARE NOT IMPROVED.   FOLLOW UP IN 4 MOS.  UPPER ENDOSCOPY AFTER CARE Read the instructions outlined below and refer to this sheet in the next week. These discharge instructions provide you with general information on caring for yourself after you leave the hospital. While your treatment has been planned according to the most current medical practices available, unavoidable complications occasionally occur. If you have any problems or questions after discharge, call DR. Archibald Marchetta, [phone].  ACTIVITY  You may resume your regular activity, but move at a slower pace for the next 24 hours.   Take frequent rest periods for the next 24 hours.   Walking will help get rid of the air and reduce the bloated feeling in your belly (abdomen).   No driving for 24 hours (because of the medicine (anesthesia) used during the test).   You may shower.   Do not sign any important legal documents or operate any machinery for 24 hours (because of the anesthesia used during the test).    NUTRITION  Drink plenty of fluids.   You may resume your normal diet as instructed by your doctor.    Begin with a light meal and progress to your normal diet. Heavy or fried foods are harder to digest and may make you feel sick to your stomach (nauseated).   Avoid alcoholic beverages for 24 hours or as instructed.    MEDICATIONS  You may resume your normal medications.   WHAT YOU CAN EXPECT TODAY  Some feelings of bloating in the abdomen.   Passage of more gas than usual.    IF YOU HAD A BIOPSY TAKEN DURING THE UPPER ENDOSCOPY:  Eat a soft diet IF YOU HAVE NAUSEA, BLOATING, ABDOMINAL PAIN, OR VOMITING.    FINDING OUT THE RESULTS OF YOUR TEST Not all test results are available during your visit. DR. Darrick Penna WILL CALL YOU WITHIN 14 DAYS OF YOUR PROCEDUE WITH YOUR RESULTS. Do not assume everything is normal if you have not heard from DR. Camyla Camposano, CALL HER OFFICE AT [phone].  SEEK IMMEDIATE MEDICAL ATTENTION AND CALL THE OFFICE: [phone] IF:  You have more than a spotting of blood in your stool.   Your belly is swollen (abdominal distention).   You are nauseated or vomiting.   You have a temperature over 101F.   You have abdominal pain or discomfort that is severe or gets worse throughout the day.   Lifestyle and home remedies TO CONTROL REFLUX/NAUSEA  You may eliminate or reduce the frequency of heartburn by making the following lifestyle changes:   Control your weight. Being overweight is a  major risk factor for heartburn and GERD. Excess pounds put pressure on your abdomen, pushing up your stomach and causing acid to back up into your esophagus. This reduces pressure on the lower esophageal sphincter, helping to prevent the valve from opening and acid from washing back into your esophagus.    Loosen your belt. Clothes that fit tightly around your waist put pressure on your abdomen and the lower esophageal sphincter.    Eliminate heartburn triggers. Everyone has specific triggers. Common triggers such as fatty or fried foods, spicy food, tomato sauce,  carbonated beverages, alcohol, chocolate, mint, garlic, onion, caffeine and nicotine may make heartburn worse.    Avoid stooping or bending AFTER EATING. Tying your shoes is OK. Bending over for longer periods to weed your garden isn't, especially soon after eating.    Don't lie down after a meal. Wait at least three to four hours after eating before going to bed, and don't lie down right after eating.     Alternative medicine  Several home remedies exist for treating GERD, but they provide only temporary relief. They include drinking baking soda (sodium bicarbonate) added to water or drinking other fluids such as baking soda mixed with cream of tartar and water.  Although these liquids create temporary relief by neutralizing, washing away or buffering acids, eventually they aggravate the situation by adding gas and fluid to your stomach, increasing pressure and causing more acid reflux. Further, adding more sodium to your diet may increase your blood pressure and add stress to your heart, and excessive bicarbonate ingestion can alter the acid-base balance in your body.    Gastritis  Gastritis is an inflammation (the body's way of reacting to injury and/or infection) of the stomach. It is often caused by viral or bacterial (germ) infections. It can also be caused BY ASPIRIN, BC/GOODY POWDER'S, (IBUPROFEN) MOTRIN, OR ALEVE (NAPROXEN), chemicals (including alcohol), SPICY FOODS, and medications. This illness may be associated with generalized malaise (feeling tired, not well), UPPER ABDOMINAL STOMACH cramps, and fever. One common bacterial cause of gastritis is an organism known as H. Pylori. This can be treated with antibiotics.

## 2016-12-26 NOTE — Anesthesia Preprocedure Evaluation (Signed)
Anesthesia Evaluation  Patient identified by MRN, date of birth, ID band Patient awake    Reviewed: Allergy & Precautions, NPO status , Patient's Chart, lab work & pertinent test results  History of Anesthesia Complications (+) PONV and history of anesthetic complications  Airway Mallampati: III  TM Distance: >3 FB     Dental  (+) Teeth Intact   Pulmonary    breath sounds clear to auscultation       Cardiovascular negative cardio ROS   Rhythm:Regular Rate:Normal     Neuro/Psych  Headaches, PSYCHIATRIC DISORDERS Depression Paresthesia of both hands     GI/Hepatic GERD (dysphagia)  ,  Endo/Other    Renal/GU      Musculoskeletal  (+) Arthritis , Fibromyalgia -  Abdominal   Peds  Hematology   Anesthesia Other Findings   Reproductive/Obstetrics                             Anesthesia Physical Anesthesia Plan  ASA: II  Anesthesia Plan: MAC   Post-op Pain Management:    Induction: Intravenous  Airway Management Planned: Simple Face Mask  Additional Equipment:   Intra-op Plan:   Post-operative Plan:   Informed Consent: I have reviewed the patients History and Physical, chart, labs and discussed the procedure including the risks, benefits and alternatives for the proposed anesthesia with the patient or authorized representative who has indicated his/her understanding and acceptance.     Plan Discussed with:   Anesthesia Plan Comments:         Anesthesia Quick Evaluation

## 2016-12-29 ENCOUNTER — Telehealth: Payer: Self-pay | Admitting: Gastroenterology

## 2016-12-29 ENCOUNTER — Encounter (HOSPITAL_COMMUNITY): Payer: Self-pay | Admitting: Gastroenterology

## 2016-12-29 NOTE — Telephone Encounter (Signed)
Received US abdomen from March 2018 at [facility]. This showed no gallstones or wall thickening. Negative Murphy's sign.   Continue Protonix. Call if abdominal pain persists, otherwise will see in Aug.

## 2017-01-01 MED ORDER — ONDANSETRON HCL 4 MG PO TABS: 4.0000 mg | ORAL_TABLET | Freq: Three times a day (TID) | ORAL | 1 refills | Status: DC | PRN

## 2017-01-01 NOTE — Telephone Encounter (Signed)
I sent in Zofran to take as needed. Call if nausea persists despite this or if having abdominal pain. Avoid foods that exacerbate nausea. Will see in Aug.

## 2017-01-01 NOTE — Telephone Encounter (Signed)
PT is aware. Said she is not really having abdominal pain now. But she does have nausea, depending on what she eats.  Please advise!

## 2017-01-02 NOTE — Telephone Encounter (Signed)
Left message for a return call

## 2017-01-02 NOTE — Telephone Encounter (Signed)
PT is aware.

## 2017-01-03 ENCOUNTER — Telehealth: Payer: Self-pay | Admitting: Gastroenterology

## 2017-01-03 NOTE — Telephone Encounter (Addendum)
Please call pt. HER stomach Bx shows gastritis.  HER SMALL BOWEL BIOPSIES ARE NORMAL.   CONTINUE YOUR WEIGHT LOSS EFFORTS. LOSE 10 LBS IT WILL HELP CONTROL REFLUX.  DRINK WATER TO KEEP YOUR URINE LIGHT YELLOW.  AVOID TRIGGERS FOR REFLUX.   FOLLOW A LOW FAT DIET. MEATS SHOULD BE BAKED, BROILED, OR BOILED. AVOID FRIED FOODS.   IF NO SOURCE OF NAUSEA AND DIARRHEA ARE IDENTIFIED, YOU SHOULD CONSIDER TAPERING OFF CYMBALTA AND GOING BACK ON ZOLOFT OR SWITCHING TO PAXIL.  Please CALL IN one month IF SYMPTOMS ARE NOT IMPROVED.   FOLLOW UP IN 4 MOS E30 DIARRHEA/DSPEPSIA.

## 2017-01-04 NOTE — Telephone Encounter (Signed)
PATIENT SCHEDULED FOR FOLLOW UP 

## 2017-01-04 NOTE — Telephone Encounter (Signed)
PT is aware.

## 2017-01-18 ENCOUNTER — Ambulatory Visit (INDEPENDENT_AMBULATORY_CARE_PROVIDER_SITE_OTHER): Payer: Medicaid Other | Admitting: Neurology

## 2017-01-18 ENCOUNTER — Encounter: Payer: Self-pay | Admitting: Neurology

## 2017-01-18 VITALS — BP 120/80 | HR 81 | Ht 63.0 in | Wt 165.5 lb

## 2017-01-18 DIAGNOSIS — G43709 Chronic migraine without aura, not intractable, without status migrainosus: Secondary | ICD-10-CM

## 2017-01-18 DIAGNOSIS — IMO0002 Reserved for concepts with insufficient information to code with codable children: Secondary | ICD-10-CM

## 2017-01-18 MED ORDER — RIZATRIPTAN BENZOATE 10 MG PO TBDP: 10.0000 mg | ORAL_TABLET | ORAL | 6 refills | Status: DC | PRN

## 2017-01-18 NOTE — Progress Notes (Signed)
[facility]  PATIENT: Ruth Shah DOB: [birth year]   REASON FOR VISIT: Follow-up for migraine HISTORY FROM: Patient    HISTORY OF PRESENT ILLNESS: HISTORY 07/20/16 Ruth Shah is a 41 years old right-handed female, accompanied by her husband [name], seen in refer by her primary care physician Dr. Selinda Flavin for evaluation of bilateral hands paresthesia.  She had a history of depression, taking Zoloft 50 mg daily, reported under good control, ADHD, is taking Adderall XR 20 mg every morning since 2015, she works as Armed forces training and education officer, has 4 children. I saw her initially on August 25th 2017 for complains of bilateral hands numbness, electrodiagnostic study was normal, there was no evidence of bilateral upper extremity neuropathy or cervical radiculopathy.  Her hands has been bother her Since 2002, she reported previous normal EMG nerve conduction study too. Over the years, she has worsening bilateral hands paresthesia, involving all 5 fingers, radiating to to her forearm, elbow, since 2016, she also noticed neck pain, mild unbalanced gait, she has urinary urgency, she contributed to her fall vaginal delivery.  Also has a history of chronic migraine since 2007, used to have migraine once a month, since 2015, she has couple migraines each week, bilateral severe pounding headache with associated light noise sensitivity, trigger for her migraines are weather change, stress, sleep deprivation, hungry, bright light, strong smells, her headache lasting for hours to one day, she has been taking naproxen 500 mg every morning since March 2017 for her mild to moderate headaches is limited result,  She also complains of chronic neck pain, upper back pain, radiating pain to occipital region,  UPDATE Oct 26th 2017: She could not tolerate nortriptyline, complains of early morning drowsiness, she has tried Imitrex 50 mg as needed, with mild improvement, she has migraine 2-3 times  each week, Complains constant neck pressure, taking naproxen 500 mg daily  MRI cervical spine in October 2017, mild multilevel degenerative disc disease, most severe at C5-6 C6-7, no significant canal or foraminal stenosis. stenosis.   UPDATE January 18 2017: Migraine has much improved with Topamax 100 mg she could not tolerate twice a day due to mental slowing, she only has 1-2 migraines each months, Imitrex 100 mg as needed, take about 1 hour or longer to take effect, she does has significant nausea with each migraine.  REVIEW OF SYSTEMS: Full 14 system review of systems performed and notable only for those listed, all others are neg:  As above.   ALLERGIES: Allergies  Allergen Reactions  . Sulfa Antibiotics Nausea Only  . Toradol [Ketorolac Tromethamine] Nausea Only  . Vicodin [Hydrocodone-Acetaminophen] Itching    HOME MEDICATIONS: Outpatient Medications Prior to Visit  Medication Sig Dispense Refill  . ADDERALL XR 20 MG 24 hr capsule Take 20 mg by mouth every morning.  0  . Cholecalciferol (VITAMIN D-3) 1000 units CAPS Take 1,000 Units by mouth daily.     . cyclobenzaprine (FLEXERIL) 10 MG tablet Take 10 mg by mouth daily as needed for muscle spasms.    . DULoxetine (CYMBALTA) 60 MG capsule Take 60 mg by mouth every evening.     . hydrocortisone (ANUSOL-HC) 2.5 % rectal cream Place 1 application rectally 2 (two) times daily. 30 g 1  . naproxen (NAPROSYN) 500 MG tablet Take 500 mg by mouth daily as needed for mild pain.     Marland Kitchen ondansetron (ZOFRAN) 4 MG tablet Take 1 tablet (4 mg total) by mouth every 8 (eight) hours as needed for nausea  or vomiting. 20 tablet 6  . ondansetron (ZOFRAN) 4 MG tablet Take 1 tablet (4 mg total) by mouth every 8 (eight) hours as needed for nausea or vomiting. 30 tablet 1  . pantoprazole (PROTONIX) 40 MG tablet Take 1 tablet (40 mg total) by mouth daily. Take 30 minutes before breakfast 90 tablet 3  . SUMAtriptan (IMITREX) 100 MG tablet Take 1 tablet (100  mg total) by mouth every 2 (two) hours as needed for migraine. May repeat in 2 hours if headache persists or recurs. 12 tablet 11  . topiramate (TOPAMAX) 100 MG tablet Take 1 tablet (100 mg total) by mouth 2 (two) times daily. (Patient taking differently: Take 100 mg by mouth daily. ) 60 tablet 11   No facility-administered medications prior to visit.     PAST MEDICAL HISTORY: Past Medical History:  Diagnosis Date  . ADD (attention deficit disorder)   . Arthritis   . Depression   . Fibromyalgia   . GERD (gastroesophageal reflux disease)   . Herpes simplex disease   . Migraines   . Paresthesia of both hands   . PONV (postoperative nausea and vomiting)     PAST SURGICAL HISTORY: Past Surgical History:  Procedure Laterality Date  . ENDOMETRIAL ABLATION    . ESOPHAGOGASTRODUODENOSCOPY (EGD) WITH PROPOFOL N/A 12/26/2016   Procedure: ESOPHAGOGASTRODUODENOSCOPY (EGD) WITH PROPOFOL;  Surgeon: West Bali, MD;  Location: [facility];  Service: Endoscopy;  Laterality: N/A;  730   . HERNIA REPAIR     inguinal as a baby   . left ovarian removal due to cyst    . PILONIDAL CYST / SINUS EXCISION  2009  . TUBAL LIGATION      FAMILY HISTORY: Family History  Problem Relation Age of Onset  . Cancer Mother     skin  . Depression Mother   . Hypertension Mother   . Hyperlipidemia Mother   . Cancer Father     skin  . Arthritis Father   . Hypertension Father   . Hyperlipidemia Father   . Depression Maternal Grandmother   . Hypertension Maternal Grandmother   . Mental illness Maternal Grandmother   . Cancer Maternal Grandfather     kidney  . Hypertension Maternal Grandfather   . Kidney disease Maternal Grandfather   . Emphysema Paternal Grandmother   . Alzheimer's disease Paternal Grandmother   . Parkinson's disease Paternal Grandfather   . Colon cancer Neg Hx   . Colon polyps Neg Hx     SOCIAL HISTORY: Social History   Social History  . Marital status: Married    Spouse  name: N/A  . Number of children: 4  . Years of education: Some College   Occupational History  . Not on file.   Social History Main Topics  . Smoking status: Never Smoker  . Smokeless tobacco: Never Used  . Alcohol use No  . Drug use: No  . Sexual activity: Yes    Birth control/ protection: Surgical   Other Topics Concern  . Not on file   Social History Narrative   Lives at home with husband and children.   Right-handed.   4 cups caffeine per week.     PHYSICAL EXAM  Vitals:   01/18/17 1048  BP: 120/80  Pulse: 81  Weight: 165 lb 8 oz (75.1 kg)  Height:  (1.6 m)   Body mass index is 29.32 kg/m.  Generalized: Well developed, Obese female in no acute distress  Head: normocephalic and  atraumatic,. Oropharynx benign  Neck: Supple, no carotid bruits  Cardiac: Regular rate rhythm, no murmur  Musculoskeletal: No deformity   Neurological examination   Mentation: Alert oriented to time, place, history taking. Attention span and concentration appropriate. Recent and remote memory intact.  Follows all commands speech and language fluent.   Cranial nerve II-XII: Visual acuity 20/30 bilaterally Pupils were equal round reactive to light extraocular movements were full, visual field were full on confrontational test. Facial sensation and strength were normal. hearing was intact to finger rubbing bilaterally. Uvula tongue midline. head turning and shoulder shrug were normal and symmetric.Tongue protrusion into cheek strength was normal. Motor: normal bulk and tone, full strength in the BUE, BLE, fine finger movements normal, no pronator drift. No focal weakness Sensory: normal and symmetric to light touch, pinprick, and  Vibration, in the upper and lower extremities Coordination: finger-nose-finger, heel-to-shin bilaterally, no dysmetria Reflexes: Brachioradialis 2/2, biceps 2/2, triceps 2/2, patellar 2/2, Achilles 2/2, plantar responses were flexor bilaterally. Gait and  Station: Rising up from seated position without assistance, normal stance,  moderate stride, good arm swing, smooth turning, able to perform tiptoe, and heel walking without difficulty. Tandem gait is steady  DIAGNOSTIC DATA (LABS, IMAGING, TESTING) - I reviewed patient records, labs, notes, testing and imaging myself where available.  Lab Results  Component Value Date   WBC 8.1 12/25/2016   HGB 13.5 12/25/2016   HCT 40.8 12/25/2016   MCV 92.1 12/25/2016   PLT 265 12/25/2016      Component Value Date/Time   NA 140 12/25/2016 0913   NA 141 06/06/2016 1053   K 3.6 12/25/2016 0913   CL 106 12/25/2016 0913   CO2 26 12/25/2016 0913   GLUCOSE 80 12/25/2016 0913   BUN 12 12/25/2016 0913   BUN 13 06/06/2016 1053   CREATININE 0.80 12/25/2016 0913   CALCIUM 9.1 12/25/2016 0913   PROT 6.9 06/06/2016 1053   ALBUMIN 4.5 06/06/2016 1053   AST 14 06/06/2016 1053   ALT 16 06/06/2016 1053   ALKPHOS 82 06/06/2016 1053   BILITOT 0.9 06/06/2016 1053   GFRNONAA >60 12/25/2016 0913   GFRAA >60 12/25/2016 0913    Lab Results  Component Value Date   VITAMINB12 332 06/06/2016   Lab Results  Component Value Date   TSH 1.880 06/06/2016    ASSESSMENT AND PLAN  41 y.o. year old female  Chronic migraine  Much improved with Topamax as preventive medication, she could not tolerate 100 mg twice a day, complains of mental slowing, keep Topamax 100 mg every night as preventive medication  Suboptimal response to Imitrex 100 mg as needed, will try Maxalt 10 mg as needed, may mix together with Zofran 4 mg, even naproxen 500 mg as needed,  Only return to clinic for new issues,  Levert Feinstein, M.D. Ph.D.  [facility] [address] [place], [place] [address] Phone: [phone] Fax:      [phone]

## 2017-02-15 ENCOUNTER — Encounter (INDEPENDENT_AMBULATORY_CARE_PROVIDER_SITE_OTHER): Payer: Self-pay | Admitting: Orthopaedic Surgery

## 2017-02-15 ENCOUNTER — Ambulatory Visit (INDEPENDENT_AMBULATORY_CARE_PROVIDER_SITE_OTHER): Payer: Medicaid Other | Admitting: Orthopaedic Surgery

## 2017-02-15 VITALS — BP 130/70 | HR 92 | Ht 63.0 in | Wt 162.0 lb

## 2017-02-15 DIAGNOSIS — M545 Low back pain, unspecified: Secondary | ICD-10-CM | POA: Insufficient documentation

## 2017-02-15 DIAGNOSIS — G8929 Other chronic pain: Secondary | ICD-10-CM

## 2017-02-15 DIAGNOSIS — M542 Cervicalgia: Secondary | ICD-10-CM | POA: Diagnosis not present

## 2017-02-15 NOTE — Progress Notes (Addendum)
Office Visit Note orthopedic consultation requesting physician Dr. Selinda Flavin   Patient: Ruth Shah           Date of Birth: 27-Sep-1975           MRN: [id number] Visit Date: 02/15/2017              Requested by: Selinda Flavin, MD [address] [place], [place] [address] PCP: Selinda Flavin, MD   Assessment & Plan: Visit Diagnoses:  1. Chronic bilateral low back pain without sciatica   2. Neck pain     Plan: Prednisone 10 mg #21 tablets prescribed. She'll take this with food. I'll recheck her in 5 weeks if she is having persistent symptoms we can consider diagnostic imaging of the lumbar spine. We discussed core strengthening activities, walking program as tolerated. Thank you for the opportunity to see her in consultation. Follow-Up Instructions: Return in about 5 weeks (around 03/22/2017).   Orders:  No orders of the defined types were placed in this encounter.  No orders of the defined types were placed in this encounter.     Procedures: No procedures performed   Clinical Data: No additional findings.   Subjective: Chief Complaint  Patient presents with  . Neck - Pain  . Lower Back - Pain    HPI 41 year old female with history of scoliosis and disc bulges in her neck with chronic low back pain. She's had problems with headaches. States when sitting position she has sharp pain down her left leg with numbness and tingling in her foot. In the past she's had prednisone pack, Naprosyn, ice, heat, muscle rubs. Patient works as a Research scientist (medical) and it bothers her when she has to pick up dogs and place on the table for cutting and washing. Patient is a nonsmoker. She denies associated bowel or bladder such symptoms. No fever or chills. Office visit 01/26/2017 from [facility] reviewed.  Review of Systems 14 point review of systems updated from 01/26/2017 primary care visit and is unchanged. Positive for chronic neck and back pain. Positive for ADHD and depression. Previous  sinus surgery 2010. Oophorectomy 2000. Previous hernia surgeries when she was an infant. Positive for  acid reflux asthma depression anxiety. Previous cervical MRI 06/29/2016 show degenerative changes C5-6 and C6-7 with small central protrusion at C5-6 and mild canal stenosis at C6-7 with cord flattening   Objective: Vital Signs: BP 130/70   Pulse 92   Ht 5\' 3"  (1.6 m)   Wt 162 lb (73.5 kg)   BMI 28.70 kg/m   Physical Exam  Constitutional: She is oriented to person, place, and time. She appears well-developed.  HENT:  Head: Normocephalic.  Right Ear: External ear normal.  Left Ear: External ear normal.  Eyes: Pupils are equal, round, and reactive to light.  Neck: No tracheal deviation present. No thyromegaly present.  Cardiovascular: Normal rate.   Pulmonary/Chest: Effort normal.  Abdominal: Soft.  Musculoskeletal:  Patient has brachial plexus tenderness both right and left. Negative Spurling no shoulder impingement. Elbow reached full extension reflexes upper and lower extremities are symmetrical. Chest some pain with palpation lumbar spine mild tenderness over the sciatic notch normal hip range of motion negative Faber test. Knee and ankle jerk are intact anterior tib EHL gastrocsoleus intact she is able to heel and toe walk. Distal pulses are intact. Pain with popliteal compression with straight leg raise on the left negative on the right.  Neurological: She is alert and oriented to person, place,  and time.  Skin: Skin is warm and dry.  Psychiatric: She has a normal mood and affect. Her behavior is normal.    Ortho Exam  Specialty Comments:  No specialty comments available.  Imaging: No results found.   PMFS History: Patient Active Problem List   Diagnosis Date Noted  . Chronic bilateral low back pain without sciatica 02/15/2017  . Dyspepsia 12/21/2016  . Dysphagia 12/21/2016  . Nausea without vomiting 12/21/2016  . Chronic migraine 07/20/2016  . Neck pain 07/20/2016    . 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 H/O Genital warts 08/12/2015   Past Medical History:  Diagnosis Date  . ADD (attention deficit disorder)   . Arthritis   . Depression   . Fibromyalgia   . GERD (gastroesophageal reflux disease)   . Herpes simplex disease   . Migraines   . Paresthesia of both hands   . PONV (postoperative nausea and vomiting)     Family History  Problem Relation Age of Onset  . Cancer Mother        skin  . Depression Mother   . Hypertension Mother   . Hyperlipidemia Mother   . Cancer Father        skin  . Arthritis Father   . Hypertension Father   . Hyperlipidemia Father   . Depression Maternal Grandmother   . Hypertension Maternal Grandmother   . Mental illness Maternal Grandmother   . Cancer Maternal Grandfather        kidney  . Hypertension Maternal Grandfather   . Kidney disease Maternal Grandfather   . Emphysema Paternal Grandmother   . Alzheimer's disease Paternal Grandmother   . Parkinson's disease Paternal Grandfather   . Colon cancer Neg Hx   . Colon polyps Neg Hx     Past Surgical History:  Procedure Laterality Date  . ENDOMETRIAL ABLATION    . ESOPHAGOGASTRODUODENOSCOPY (EGD) WITH PROPOFOL N/A 12/26/2016   Procedure: ESOPHAGOGASTRODUODENOSCOPY (EGD) WITH PROPOFOL;  Surgeon: West BaliSandi L Fields, MD;  Location: [facility];  Service: Endoscopy;  Laterality: N/A;  730   . HERNIA REPAIR     inguinal as a baby   . left ovarian removal due to cyst    . PILONIDAL CYST / SINUS EXCISION  2009  . TUBAL LIGATION     Social History   Occupational History  . Not on file.   Social History Main Topics  . Smoking status: Never Smoker  . Smokeless tobacco: Never Used  . Alcohol use No  . Drug use: No  . Sexual activity: Yes    Birth control/ protection: Surgical

## 2017-02-21 ENCOUNTER — Ambulatory Visit: Payer: Medicaid Other | Admitting: Gastroenterology

## 2017-02-22 ENCOUNTER — Ambulatory Visit: Payer: Medicaid Other | Admitting: Gastroenterology

## 2017-03-01 ENCOUNTER — Ambulatory Visit (INDEPENDENT_AMBULATORY_CARE_PROVIDER_SITE_OTHER): Payer: Medicaid Other | Admitting: Otolaryngology

## 2017-03-01 DIAGNOSIS — H93293 Other abnormal auditory perceptions, bilateral: Secondary | ICD-10-CM | POA: Diagnosis not present

## 2017-03-01 DIAGNOSIS — H6983 Other specified disorders of Eustachian tube, bilateral: Secondary | ICD-10-CM

## 2017-03-01 DIAGNOSIS — H9209 Otalgia, unspecified ear: Secondary | ICD-10-CM

## 2017-03-13 ENCOUNTER — Ambulatory Visit (HOSPITAL_COMMUNITY)
Admission: RE | Admit: 2017-03-13 | Discharge: 2017-03-13 | Disposition: A | Discharge status: Home / Self Care | Payer: Medicaid Other | Source: Ambulatory Visit | Attending: Physician Assistant | Admitting: Physician Assistant

## 2017-03-13 DIAGNOSIS — N6452 Nipple discharge: Secondary | ICD-10-CM | POA: Diagnosis present

## 2017-03-13 DIAGNOSIS — Z09 Encounter for follow-up examination after completed treatment for conditions other than malignant neoplasm: Secondary | ICD-10-CM

## 2017-03-15 ENCOUNTER — Telehealth: Payer: Self-pay | Admitting: Gastroenterology

## 2017-03-15 NOTE — Telephone Encounter (Signed)
I called pt and she said she was told to call back if she continued to have nausea. She is still having some nausea, but notes it more when she eats dairy and greasy goods. She is taking Protonix daily. I told her in Dr. Evelina DunField's result note she recommended that pt might want to taper off of Cymbalta and going back on Zoloft. She said she does not feel she can do that because the Cymbalta helps her fibromyalgia so much.  I encouraged her to avoid the dairy and greasy foods. She said when she has a episode of nausea, she will put herself on the BRAT diet for a few days. Last night she just had rice and applesauce.  She is aware that Lewie LoronAnna Boone, NP is on vacation and she said it is fine to wait for her return. I offered to discuss with another provider and she said no, she is fine to wait for Tobi Bastosnna.  She was just following up to let us know she was still having problems with the nausea.

## 2017-03-15 NOTE — Telephone Encounter (Signed)
[phone]  Please call patient, still having symptoms

## 2017-03-19 NOTE — Telephone Encounter (Signed)
Could pursue HIDA scan to wrap up GI evaluation.

## 2017-03-20 ENCOUNTER — Other Ambulatory Visit: Payer: Self-pay

## 2017-03-20 DIAGNOSIS — R110 Nausea: Secondary | ICD-10-CM

## 2017-03-20 NOTE — Telephone Encounter (Signed)
HIDA scan scheduled for 03/27/17 at 8:00am, pt to arrive at 7:45am. NPO after midnight before test and no pain med after midnight. Tried to call pt, no answer, LMOVM and informed her of appt and instructions.  Attempted to submit PA info for HIDA via [employer] website. No PA needed (CPT code doesn't exist).

## 2017-03-20 NOTE — Telephone Encounter (Signed)
Pt is aware. OK to schedule the HIDA. She is off on Tues and Thurs so please schedule for one of those day.

## 2017-03-22 ENCOUNTER — Ambulatory Visit (INDEPENDENT_AMBULATORY_CARE_PROVIDER_SITE_OTHER): Payer: Medicaid Other | Admitting: Orthopaedic Surgery

## 2017-03-22 ENCOUNTER — Encounter (INDEPENDENT_AMBULATORY_CARE_PROVIDER_SITE_OTHER): Payer: Self-pay | Admitting: Orthopaedic Surgery

## 2017-03-22 VITALS — BP 137/85 | HR 85 | Ht 63.0 in | Wt 160.0 lb

## 2017-03-22 DIAGNOSIS — M545 Low back pain, unspecified: Secondary | ICD-10-CM

## 2017-03-22 DIAGNOSIS — M542 Cervicalgia: Secondary | ICD-10-CM

## 2017-03-22 DIAGNOSIS — G8929 Other chronic pain: Secondary | ICD-10-CM | POA: Diagnosis not present

## 2017-03-22 NOTE — Addendum Note (Signed)
Addended by: Rogers SeedsYEATTS, Princesa Willig M on: 03/22/2017 11:03 AM   Modules accepted: Orders

## 2017-03-22 NOTE — Progress Notes (Signed)
Office Visit Note   Patient: Ruth Shah           Date of Birth: [birth year]           MRN: [id number] Visit Date: 03/22/2017              Requested by: Selinda Flavin, MD [address] [place], [place] [address] PCP: Selinda Flavin, MD   Assessment & Plan: Visit Diagnoses:  1. Chronic bilateral low back pain without sciatica   2. Neck pain     Plan: Patient sent persistent back pain and hip pain. She's not able to participate in normal workout activities that she used to do. We'll proceed with a lumbar MRI scan. She's been on exercise program taken anti-inflammatories, prednisone Dosepak. FOLLOW-up after MRI scan.  Follow-Up Instructions: No Follow-up on file.   Orders:  No orders of the defined types were placed in this encounter.  No orders of the defined types were placed in this encounter.     Procedures: No procedures performed   Clinical Data: No additional findings.   Subjective: Chief Complaint  Patient presents with  . Neck - Pain, Follow-up  . Lower Back - Pain, Follow-up    HPI patient returns with ongoing problems with worse back pain and neck pain. She states that the prednisone pack gave her about 50% relief of her symptoms seem to be a low bit better helping her neck pain that her low back pain. Pains at the lumbosacral junction and radiates into her hips. She has equal symptoms both right and left leg. Neck gives her problems with stiffness she has associated migraines with her headaches and associated neck pain. Sometimes she states that he makes her feel dizzy. She is on multiple medications that she uses when she has the migraine.  Review of Systems 14 point review of systems updated and is unchanged from 02/15/2017 office visit other than as mentioned in history of present illness with response with prednisone pack. 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 And  she's had the Flonase added this medication recently.   Objective: Vital Signs: BP 137/85   Pulse 85   Ht 5\' 3"  (1.6 m)   Wt 160 lb (72.6 kg)   BMI 28.34 kg/m   Physical Exam  Constitutional: She is oriented to person, place, and time. She appears well-developed.  HENT:  Head: Normocephalic.  Right Ear: External ear normal.  Left Ear: External ear normal.  Eyes: Pupils are equal, round, and reactive to light.  Neck: No tracheal deviation present. No thyromegaly present.  Cardiovascular: Normal rate.   Pulmonary/Chest: Effort normal.  Abdominal: Soft.  Musculoskeletal:  Patient's pelvis is level she has tenderness from L4-S1 spinous process of paralumbar region. Mild sciatic notch tenderness. Mild to moderate trochanteric bursal tenderness. Negative straight leg raising 90. Anterior tib EHL is intact. She can heel and toe walk.  Neurological: She is alert and oriented to person, place, and time.  Skin: Skin is warm and dry.  Psychiatric: She has a normal mood and affect. Her behavior is normal.    Ortho Exam patient's brachial plexus tenderness both right and left. No impingement on the shoulders. Negative for me negative Spurling. Negative Faber test. Some pain with popliteal compression both right and left.  Specialty Comments:  No specialty comments available.  Imaging: No results found. Previous lumbar MRI showed normal alignmenton the listhesis and well-maintained  disc space height from 2017 x-rays.   PMFS History: Patient Active Problem List   Diagnosis Date Noted  . Chronic bilateral low back pain without sciatica 02/15/2017  . Dyspepsia 12/21/2016  . Dysphagia 12/21/2016  . Nausea without vomiting 12/21/2016  . Chronic migraine 07/20/2016  . Neck pain 07/20/2016  . 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 H/O Genital warts 08/12/2015   Past Medical History:  Diagnosis Date  . ADD (attention deficit disorder)   . Arthritis   . Depression   . Fibromyalgia   . GERD  (gastroesophageal reflux disease)   . Herpes simplex disease   . Migraines   . Paresthesia of both hands   . PONV (postoperative nausea and vomiting)     Family History  Problem Relation Age of Onset  . Cancer Mother        skin  . Depression Mother   . Hypertension Mother   . Hyperlipidemia Mother   . Cancer Father        skin  . Arthritis Father   . Hypertension Father   . Hyperlipidemia Father   . Depression Maternal Grandmother   . Hypertension Maternal Grandmother   . Mental illness Maternal Grandmother   . Cancer Maternal Grandfather        kidney  . Hypertension Maternal Grandfather   . Kidney disease Maternal Grandfather   . Emphysema Paternal Grandmother   . Alzheimer's disease Paternal Grandmother   . Parkinson's disease Paternal Grandfather   . Colon cancer Neg Hx   . Colon polyps Neg Hx     Past Surgical History:  Procedure Laterality Date  . ENDOMETRIAL ABLATION    . ESOPHAGOGASTRODUODENOSCOPY (EGD) WITH PROPOFOL N/A 12/26/2016   Procedure: ESOPHAGOGASTRODUODENOSCOPY (EGD) WITH PROPOFOL;  Surgeon: West BaliSandi L Fields, MD;  Location: [facility];  Service: Endoscopy;  Laterality: N/A;  730   . HERNIA REPAIR     inguinal as a baby   . left ovarian removal due to cyst    . PILONIDAL CYST / SINUS EXCISION  2009  . TUBAL LIGATION     Social History   Occupational History  . Not on file.   Social History Main Topics  . Smoking status: Never Smoker  . Smokeless tobacco: Never Used  . Alcohol use No  . Drug use: No  . Sexual activity: Yes    Birth control/ protection: Surgical

## 2017-03-27 ENCOUNTER — Encounter (HOSPITAL_COMMUNITY)
Admission: RE | Admit: 2017-03-27 | Discharge: 2017-03-27 | Disposition: A | Discharge status: Home / Self Care | Payer: Medicaid Other | Source: Ambulatory Visit | Attending: Gastroenterology | Admitting: Gastroenterology

## 2017-03-27 ENCOUNTER — Encounter (HOSPITAL_COMMUNITY): Payer: Self-pay

## 2017-03-27 DIAGNOSIS — R110 Nausea: Secondary | ICD-10-CM | POA: Diagnosis present

## 2017-03-27 MED ORDER — TECHNETIUM TC 99M MEBROFENIN IV KIT
5.0000 | PACK | Freq: Once | INTRAVENOUS | Status: AC | PRN
  Administered 2017-03-27: 5 via INTRAVENOUS

## 2017-03-29 ENCOUNTER — Ambulatory Visit (INDEPENDENT_AMBULATORY_CARE_PROVIDER_SITE_OTHER): Payer: Medicaid Other | Admitting: Otolaryngology

## 2017-03-29 DIAGNOSIS — H9209 Otalgia, unspecified ear: Secondary | ICD-10-CM

## 2017-04-03 ENCOUNTER — Ambulatory Visit
Admission: RE | Admit: 2017-04-03 | Discharge: 2017-04-03 | Disposition: A | Discharge status: Home / Self Care | Payer: Medicaid Other | Source: Ambulatory Visit | Attending: Orthopaedic Surgery | Admitting: Orthopaedic Surgery

## 2017-04-03 DIAGNOSIS — M545 Low back pain: Principal | ICD-10-CM

## 2017-04-03 DIAGNOSIS — G8929 Other chronic pain: Secondary | ICD-10-CM

## 2017-04-03 NOTE — Progress Notes (Signed)
HIDA scan is normal. No biliary issues. Keep appt upcoming.

## 2017-04-03 NOTE — Progress Notes (Signed)
Pt is aware. Said she just cannot figure out why she is staying so sick and having to go to the bathroom so much and having accidents. Next appt is 04/26/2017.

## 2017-04-04 NOTE — Progress Notes (Signed)
Added to wait list.

## 2017-04-04 NOTE — Progress Notes (Signed)
LMOM  That we are going to put her on cancellation list and try to get in sooner.  Call if questions. Forwarding to ChaffeeStacey to put on the cancellation list.

## 2017-04-04 NOTE — Progress Notes (Signed)
Please see Dr. Darrick PennaFields' recommendations from before. Let's see if we can get her in sooner. She possibly could be a candidate for Viberzi? Will have to further sort out in clinic.

## 2017-04-05 ENCOUNTER — Ambulatory Visit (INDEPENDENT_AMBULATORY_CARE_PROVIDER_SITE_OTHER): Payer: Medicaid Other | Admitting: Orthopaedic Surgery

## 2017-04-05 VITALS — BP 120/75 | HR 83 | Ht 63.0 in | Wt 160.0 lb

## 2017-04-05 DIAGNOSIS — M5126 Other intervertebral disc displacement, lumbar region: Secondary | ICD-10-CM

## 2017-04-05 NOTE — Addendum Note (Signed)
Addended by: Rogers SeedsYEATTS, Guillermo Nehring M on: 04/05/2017 09:55 AM   Modules accepted: Orders

## 2017-04-05 NOTE — Progress Notes (Signed)
Office Visit Note   Patient: Ruth Shah           Date of Birth: 11-12-1975           MRN: [id number] Visit Date: 04/05/2017              Requested by: Selinda Flavin, MD [address] [place], [place] [address] PCP: Selinda Flavin, MD   Assessment & Plan: Visit Diagnoses:  1. Protrusion of lumbar intervertebral disc       Left L5-S1 protrusion.  Plan: We'll set up for an epidural injection at L5-S1. 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 We discussed problems with climbing in and out of vehicles carrying turning twisting etc. She'll return after the epidural injection.  Follow-Up Instructions: No Follow-up on file.   Orders:  No orders of the defined types were placed in this encounter.  No orders of the defined types were placed in this encounter.     Procedures: No procedures performed   Clinical Data: No additional findings.   Subjective: Chief Complaint  Patient presents with  . Lower Back - Pain, Follow-up    HPI 41 year old female returns with chronic low back pain with vomiting or for: 20 years. She has back and left leg pain. She ambulates with a limp she's taken different anti-inflammatories. Past history of acid reflux, depression and anxiety. Previous MRI 2016 available for comparison to her new MRI scan. Patient has bowel or bladder symptoms no chills or fever.  Review of Systems 14 point review of systems updated unchanged from 02/15/2017.  Objective: Vital Signs: BP 120/75   Pulse 83   Ht 5\' 3"  (1.6 m)   Wt 160 lb (72.6 kg)   BMI 28.34 kg/m   Physical Exam  Constitutional: She is oriented to person, place, and time. She appears well-developed.  HENT:  Head: Normocephalic.  Right Ear: External ear normal.  Left Ear: External ear normal.  Eyes: Pupils are equal, round, and reactive to light.  Neck: No tracheal deviation present.  No thyromegaly present.  Cardiovascular: Normal rate.   Pulmonary/Chest: Effort normal.  Abdominal: Soft.  Neurological: She is alert and oriented to person, place, and time.  Skin: Skin is warm and dry.  Psychiatric: She has a normal mood and affect. Her behavior is normal.    Ortho Exam patient has some brachial plexus tenderness right and left. Good range of motion of her shoulders. Intact lower extremity reflexes. She has setting notch tenderness on the left pain with straight leg raising at 90 pain with proctoscopy compression. Anterior tib EHL is intact she is able to heel and toe walk. Peroneals are strong. Tenderness palpation from the L5-S1 at the midline. Negative Faber test. Normal hip range of motion. Patient same story with the slight left limp.  Specialty Comments:  No specialty comments available.  Imaging: Lumbar MRI scan 04/03/2017 shows shallow right eccentric disc protrusion L5-S1 contacting and potentially irritating the descending right S1 nerve root similar to 2016 scan. Mild facet hypertrophy at L4-5 with good hydration at L4-5. There is disc desiccation at L5-S1. Otherwise unremarkable well other levels.   PMFS History: Patient Active Problem List   Diagnosis Date Noted  . Chronic bilateral low back pain without sciatica 02/15/2017  . Dyspepsia 12/21/2016  . Dysphagia 12/21/2016  . Nausea without vomiting 12/21/2016  . Chronic migraine 07/20/2016  .  Neck pain 07/20/2016  . 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 H/O Genital warts 08/12/2015   Past Medical History:  Diagnosis Date  . ADD (attention deficit disorder)   . Arthritis   . Depression   . Fibromyalgia   . GERD (gastroesophageal reflux disease)   . Herpes simplex disease   . Migraines   . Paresthesia of both hands   . PONV (postoperative nausea and vomiting)     Family History  Problem Relation Age of Onset  . Cancer Mother        skin  . Depression Mother   . Hypertension Mother   .  Hyperlipidemia Mother   . Cancer Father        skin  . Arthritis Father   . Hypertension Father   . Hyperlipidemia Father   . Depression Maternal Grandmother   . Hypertension Maternal Grandmother   . Mental illness Maternal Grandmother   . Cancer Maternal Grandfather        kidney  . Hypertension Maternal Grandfather   . Kidney disease Maternal Grandfather   . Emphysema Paternal Grandmother   . Alzheimer's disease Paternal Grandmother   . Parkinson's disease Paternal Grandfather   . Colon cancer Neg Hx   . Colon polyps Neg Hx     Past Surgical History:  Procedure Laterality Date  . ENDOMETRIAL ABLATION    . ESOPHAGOGASTRODUODENOSCOPY (EGD) WITH PROPOFOL N/A 12/26/2016   Procedure: ESOPHAGOGASTRODUODENOSCOPY (EGD) WITH PROPOFOL;  Surgeon: West BaliSandi L Fields, MD;  Location: [facility];  Service: Endoscopy;  Laterality: N/A;  730   . HERNIA REPAIR     inguinal as a baby   . left ovarian removal due to cyst    . PILONIDAL CYST / SINUS EXCISION  2009  . TUBAL LIGATION     Social History   Occupational History  . Not on file.   Social History Main Topics  . Smoking status: Never Smoker  . Smokeless tobacco: Never Used  . Alcohol use No  . Drug use: No  . Sexual activity: Yes    Birth control/ protection: Surgical

## 2017-04-06 ENCOUNTER — Telehealth (INDEPENDENT_AMBULATORY_CARE_PROVIDER_SITE_OTHER): Payer: Self-pay | Admitting: Physical Medicine and Rehabilitation

## 2017-04-09 MED ORDER — DIAZEPAM 5 MG PO TABS: ORAL_TABLET | ORAL | 0 refills | Status: DC

## 2017-04-09 NOTE — Telephone Encounter (Signed)
Rx for pre-procedure valium for pre-procedure anxiety

## 2017-04-09 NOTE — Telephone Encounter (Signed)
Rx for valium printed

## 2017-04-26 ENCOUNTER — Ambulatory Visit (INDEPENDENT_AMBULATORY_CARE_PROVIDER_SITE_OTHER): Payer: Medicaid Other | Admitting: Gastroenterology

## 2017-04-26 ENCOUNTER — Encounter: Payer: Self-pay | Admitting: Gastroenterology

## 2017-04-26 ENCOUNTER — Ambulatory Visit (INDEPENDENT_AMBULATORY_CARE_PROVIDER_SITE_OTHER): Payer: Self-pay

## 2017-04-26 ENCOUNTER — Encounter (INDEPENDENT_AMBULATORY_CARE_PROVIDER_SITE_OTHER): Payer: Self-pay | Admitting: Physical Medicine and Rehabilitation

## 2017-04-26 ENCOUNTER — Ambulatory Visit (INDEPENDENT_AMBULATORY_CARE_PROVIDER_SITE_OTHER): Payer: Medicaid Other | Admitting: Physical Medicine and Rehabilitation

## 2017-04-26 VITALS — BP 133/92 | HR 80 | Temp 98.9°F | Ht 63.0 in | Wt 168.8 lb

## 2017-04-26 VITALS — BP 142/81 | HR 80

## 2017-04-26 DIAGNOSIS — R197 Diarrhea, unspecified: Secondary | ICD-10-CM | POA: Diagnosis not present

## 2017-04-26 DIAGNOSIS — M5416 Radiculopathy, lumbar region: Secondary | ICD-10-CM

## 2017-04-26 DIAGNOSIS — R1013 Epigastric pain: Secondary | ICD-10-CM

## 2017-04-26 MED ORDER — HYDROCORTISONE 2.5 % RE CREA: 1.0000 "application " | TOPICAL_CREAM | Freq: Two times a day (BID) | RECTAL | 1 refills | Status: DC

## 2017-04-26 MED ORDER — LIDOCAINE HCL (PF) 1 % IJ SOLN
2.0000 mL | Freq: Once | INTRAMUSCULAR | Status: AC
  Administered 2017-04-26: 2 mL

## 2017-04-26 MED ORDER — RIFAXIMIN 550 MG PO TABS: 550.0000 mg | ORAL_TABLET | Freq: Three times a day (TID) | ORAL | 0 refills | Status: DC

## 2017-04-26 MED ORDER — METHYLPREDNISOLONE ACETATE 80 MG/ML IJ SUSP
80.0000 mg | Freq: Once | INTRAMUSCULAR | Status: AC
  Administered 2017-04-26: 80 mg

## 2017-04-26 NOTE — Patient Instructions (Signed)
Continue Protonix once each morning.   I have sent in a prescription for the diarrhea and bloating called Xifaxan. You will take this three times a day for just 2 weeks. Hopefully, you will see improvement with this.  I have sent in the rectal cream to use twice a day for 7 days.  We will see you in 3 months!

## 2017-04-26 NOTE — Assessment & Plan Note (Signed)
Much improved with Protonix. Continue dietary and behavior modification. Return in 3 months.

## 2017-04-26 NOTE — Patient Instructions (Signed)

## 2017-04-26 NOTE — Progress Notes (Deleted)
Pain in the center of low back. Constant pain. Some days worse than others. Some radiation down legs to knees at times. Worse on left side.

## 2017-04-26 NOTE — Assessment & Plan Note (Signed)
Intermittent, likely IBS. Unable to rule out SBBO. Less likely med effect. Instead of trial of Bentyl prn, will try Xifaxan for 2 weeks. Hopefully, she will note improvement with this course. Intermittent fecal seepage noted. Scant blood on two separate occasions. Monitor closely. Rectal exam consistent with most likely hemorrhoid etiology, lax sphincter tone. Anusol BID. If any further bleeding, will pursue colonoscopy. May be a good candidate for hemorrhoid banding in future. Return in 3 months.

## 2017-04-26 NOTE — Progress Notes (Signed)
Referring Provider: Selinda FlavinHoward, Kevin, MD Primary Care Physician:  Selinda FlavinHoward, Kevin, MD Primary GI: Dr. Darrick PennaFields   Chief Complaint  Patient presents with  . fecal leakage  . dyspepsia    better since being on Protonix    HPI:   Ruth Shah is a 41 y.o. female presenting today with a history of GERD, dysphagia, abdominal pain, diarrhea. Recent EGD with one benign-appearing, intrinsic stenosis that was widely patent and traversed. Small hiatal hernia, few small sessile polyps. Chronic gastritis. Normal duodenum, negative celiac sprue. [facility]. HIDA scan normal. Recommendations to consider tapering off cymbalta and going back on Zoloft if persistent nausea/diarrhea.   On Protonix for GERD, which has helped with reflux symptoms. Tries not to eat late. Spicy foods exacerbate nausea. Diarrhea comes and goes. Sometimes feels like she has to have a BM but doesn't have to go. Has fecal seepage. Has to clean self throughout the day sometimes. Waxes and wanes in frequency. Happened a few years ago but just dismissed it as it resolved. Scant blood once or twice. Sometimes mucus/greasy. Yesterday stool was stringy appearing. Sometimes will have a formed bowel movement. Loose stool comes and goes. When it's bad, will go 3-4 times a day but normally just once every morning.   Will have sulfur-like burps and notices that this is near the time she has the loose stool.   Past Medical History:  Diagnosis Date  . ADD (attention deficit disorder)   . Arthritis   . Depression   . Fibromyalgia   . GERD (gastroesophageal reflux disease)   . Herpes simplex disease   . Migraines   . Paresthesia of both hands   . PONV (postoperative nausea and vomiting)     Past Surgical History:  Procedure Laterality Date  . ENDOMETRIAL ABLATION    . ESOPHAGOGASTRODUODENOSCOPY (EGD) WITH PROPOFOL N/A 12/26/2016   Dr. Darrick PennaFields: one benign-appearing, intrinsic stenosis that was  widely patent and traversed. Small hiatal hernia, few small sessile polyps. Chronic gastritis. Normal duodenum, negative celiac sprue.   Marland Kitchen. HERNIA REPAIR     inguinal as a baby   . left ovarian removal due to cyst    . PILONIDAL CYST / SINUS EXCISION  2009  . TUBAL LIGATION      Current Outpatient Prescriptions  Medication Sig Dispense Refill  . ADDERALL XR 20 MG 24 hr capsule Take 20 mg by mouth every morning.  0  . diazepam (VALIUM) 5 MG tablet Take 1 by mouth 1 to 2 hours pre-procedure. May repeat if necessary. 2 tablet 0  . DULoxetine (CYMBALTA) 60 MG capsule Take 60 mg by mouth every evening.     . fluticasone (FLONASE) 50 MCG/ACT nasal spray INHALE 1-2 SPRAYS IN EACH NOSTRIL DAILY. Takes as needed  99  . naproxen (NAPROSYN) 500 MG tablet Take 500 mg by mouth daily as needed for mild pain.     Marland Kitchen. ondansetron (ZOFRAN) 4 MG tablet Take 1 tablet (4 mg total) by mouth every 8 (eight) hours as needed for nausea or vomiting. 20 tablet 6  . ondansetron (ZOFRAN) 4 MG tablet Take 1 tablet (4 mg total) by mouth every 8 (eight) hours as needed for nausea or vomiting. 30 tablet 1  . pantoprazole (PROTONIX) 40 MG tablet Take 1 tablet (40 mg total) by mouth daily. Take 30 minutes before breakfast 90 tablet 3  . SUMAtriptan (IMITREX) 100 MG tablet Take 1 tablet (100 mg total)  by mouth every 2 (two) hours as needed for migraine. May repeat in 2 hours if headache persists or recurs. 12 tablet 11  . topiramate (TOPAMAX) 100 MG tablet Take 1 tablet (100 mg total) by mouth 2 (two) times daily. (Patient taking differently: Take 100 mg by mouth daily. ) 60 tablet 11   No current facility-administered medications for this visit.     Allergies as of 04/26/2017 - Review Complete 04/05/2017  Allergen Reaction Noted  . Sulfa antibiotics Nausea Only 07/20/2016  . Toradol [ketorolac tromethamine] Nausea Only 07/20/2016  . Vicodin [hydrocodone-acetaminophen] Itching 08/12/2015    Family History  Problem  Relation Age of Onset  . Cancer Mother        skin  . Depression Mother   . Hypertension Mother   . Hyperlipidemia Mother   . Cancer Father        skin  . Arthritis Father   . Hypertension Father   . Hyperlipidemia Father   . Depression Maternal Grandmother   . Hypertension Maternal Grandmother   . Mental illness Maternal Grandmother   . Cancer Maternal Grandfather        kidney  . Hypertension Maternal Grandfather   . Kidney disease Maternal Grandfather   . Emphysema Paternal Grandmother   . Alzheimer's disease Paternal Grandmother   . Parkinson's disease Paternal Grandfather   . Colon cancer Neg Hx   . Colon polyps Neg Hx     Social History   Social History  . Marital status: Married    Spouse name: N/A  . Number of children: 4  . Years of education: Some College   Social History Main Topics  . Smoking status: Never Smoker  . Smokeless tobacco: Never Used  . Alcohol use No  . Drug use: No  . Sexual activity: Yes    Birth control/ protection: Surgical   Other Topics Concern  . None   Social History Narrative   Lives at home with husband and children.   Right-handed.   4 cups caffeine per week.    Review of Systems: As mentioned in HPI   Physical Exam: BP (!) 133/92   Pulse 80   Temp 98.9 F (37.2 C) (Oral)   Ht 5\' 3"  (1.6 m)   Wt 168 lb 12.8 oz (76.6 kg)   BMI 29.90 kg/m  General:   Alert and oriented. No distress noted. Pleasant and cooperative.  Head:  Normocephalic and atraumatic. Eyes:  Conjuctiva clear without scleral icterus. Abdomen:  +BS, soft, mild RUQ tenderness to palpation and non-distended. No rebound or guarding. No HSM or masses noted. Rectal: non-thrombosed external hemorrhoids, internal exam without mass, no gross blood, query internal hemorrhoids. Lax sphincter tone.  Msk:  Symmetrical without gross deformities. Normal posture. Extremities:  Without edema. Neurologic:  Alert and  oriented x4;  grossly normal  neurologically. Psych:  Alert and cooperative. Normal mood and affect.

## 2017-04-26 NOTE — Progress Notes (Unsigned)
Fluoro Time: 12 sec Mgy: 7.10

## 2017-04-26 NOTE — Progress Notes (Signed)
cc'ed to pcp °

## 2017-04-27 ENCOUNTER — Telehealth (INDEPENDENT_AMBULATORY_CARE_PROVIDER_SITE_OTHER): Payer: Self-pay | Admitting: Radiology

## 2017-04-27 ENCOUNTER — Ambulatory Visit: Payer: Medicaid Other | Admitting: Gastroenterology

## 2017-04-27 NOTE — Telephone Encounter (Signed)
Patient called lmom that she was having a migraine.  She states that she is nausated.  ----I spoke with Dr. Alvester MorinNewton he reviewed her chart, images looked good, hx of migraines, hx of nausea.  ---He states that she needs to treat this like a regular migraine.  ------I called and advised patient and she will take the Imitrex for her headache and the zofran that she already has

## 2017-04-29 NOTE — Progress Notes (Signed)
REVIEWED. PT NEEDS TO TAPER OFF CYMBALTA AND TRY PAXIL.

## 2017-04-30 NOTE — Procedures (Signed)
Mrs. Langston MaskerMorris is a 41 year old female followed by Dr. Ophelia CharterYates for her low back pain. Her pain is centered in the low back constant with some days worse than others. She gets some radiating symptoms down the legs to the knees posteriorly. She is somewhat worse on the left than right. She does have small disc protrusion at L5-S1 really little bit more right sided. Dr. Ophelia CharterYates requested diagnostic and therapeutic epidural injection. She spelled conservative care otherwise.  Lumbar Epidural Steroid Injection - Interlaminar Approach with Fluoroscopic Guidance  Patient: Earnstine RegalMisty W Warmoth      Date of Birth: [birth year] MRN: [id number] PCP: Selinda FlavinHoward, Kevin, MD      Visit Date: 04/26/2017   Universal Protocol:     Consent Given By: the patient  Position: PRONE  Additional Comments: Vital signs were monitored before and after the procedure. Patient was prepped and draped in the usual sterile fashion. The correct patient, procedure, and site was verified.   Injection Procedure Details:  Procedure Site One Meds Administered:  Meds ordered this encounter  Medications  . lidocaine (PF) (XYLOCAINE) 1 % injection 2 mL  . methylPREDNISolone acetate (DEPO-MEDROL) injection 80 mg     Laterality: Left  Location/Site:  L5-S1  Needle size: 20 G  Needle type: Tuohy  Needle Placement: Paramedian epidural  Findings:  -Contrast Used: 1 mL iohexol 180 mg iodine/mL   -Comments: Excellent flow of contrast into the epidural space.  Procedure Details: Using a paramedian approach from the side mentioned above, the region overlying the inferior lamina was localized under fluoroscopic visualization and the soft tissues overlying this structure were infiltrated with 4 ml. of 1% Lidocaine without Epinephrine. The Tuohy needle was inserted into the epidural space using a paramedian approach.   The epidural space was localized using loss of resistance along with lateral and bi-planar fluoroscopic views.  After  negative aspirate for air, blood, and CSF, a 2 ml. volume of Isovue-250 was injected into the epidural space and the flow of contrast was observed. Radiographs were obtained for documentation purposes.    The injectate was administered into the level noted above.   Additional Comments:  The patient tolerated the procedure well Dressing: Band-Aid    Post-procedure details: Patient was observed during the procedure. Post-procedure instructions were reviewed.  Patient left the clinic in stable condition.

## 2017-05-01 NOTE — Progress Notes (Signed)
Please see Dr. Darrick PennaFields' recommendations and let patient know. I believe we had recommended this before.

## 2017-05-03 NOTE — Progress Notes (Signed)
PT is aware.

## 2017-05-03 NOTE — Progress Notes (Signed)
LMOM to call.

## 2017-05-17 ENCOUNTER — Ambulatory Visit (INDEPENDENT_AMBULATORY_CARE_PROVIDER_SITE_OTHER): Payer: Medicaid Other | Admitting: Orthopaedic Surgery

## 2017-05-17 ENCOUNTER — Encounter (INDEPENDENT_AMBULATORY_CARE_PROVIDER_SITE_OTHER): Payer: Self-pay | Admitting: Orthopaedic Surgery

## 2017-05-17 VITALS — BP 116/76 | HR 76 | Ht 63.0 in | Wt 168.0 lb

## 2017-05-17 DIAGNOSIS — M545 Low back pain, unspecified: Secondary | ICD-10-CM

## 2017-05-17 DIAGNOSIS — G8929 Other chronic pain: Secondary | ICD-10-CM

## 2017-05-17 NOTE — Progress Notes (Signed)
Office Visit Note   Patient: Ruth Shah           Date of Birth: March 26, 1976           MRN: [id number] Visit Date: 05/17/2017              Requested by: Selinda Flavin, MD [address] [place], [place] [address] PCP: Selinda Flavin, MD   Assessment & Plan: Visit Diagnoses:  1. Chronic bilateral low back pain without sciatica     Plan: Patient is an 80-85% relief with the injection. She's happy with the result she is more mobile has less pain during the day sleeping better. We'll check her back again on a when necessary basis.  Follow-Up Instructions: No Follow-up on file.   Orders:  No orders of the defined types were placed in this encounter.  No orders of the defined types were placed in this encounter.     Procedures: No procedures performed   Clinical Data: No additional findings.   Subjective: Chief Complaint  Patient presents with  . Lower Back - Pain, Follow-up    HPI A she returns post epidural injection on 04/26/2017. She's gotten 80-85% relief. She is also switch from Cymbalta to Zoloft medication. She is moving better walking better activities are improved.  Review of Systems 14 point review of systems are updated unchanged from 04/05/2017.   Objective: Vital Signs: BP 116/76   Pulse 76   Ht 5\' 3"  (1.6 m)   Wt 168 lb (76.2 kg)   BMI 29.76 kg/m   Physical Exam  Constitutional: She is oriented to person, place, and time. She appears well-developed.  HENT:  Head: Normocephalic.  Right Ear: External ear normal.  Left Ear: External ear normal.  Eyes: Pupils are equal, round, and reactive to light.  Neck: No tracheal deviation present. No thyromegaly present.  Cardiovascular: Normal rate.   Pulmonary/Chest: Effort normal.  Abdominal: Soft.  Musculoskeletal:  Patient is able to heel and toe walk she's moving better turning twisting mobility is significantly improved no isolated motor weakness no rash or exposed skin. Negative straight leg raising 90.    Neurological: She is alert and oriented to person, place, and time.  Skin: Skin is warm and dry.  Psychiatric: She has a normal mood and affect. Her behavior is normal.    Ortho Exam  Specialty Comments:  No specialty comments available.  Imaging: No results found.   PMFS History: Patient Active Problem List   Diagnosis Date Noted  . Diarrhea 04/26/2017  . Chronic bilateral low back pain without sciatica 02/15/2017  . Dyspepsia 12/21/2016  . Dysphagia 12/21/2016  . Nausea without vomiting 12/21/2016  . Chronic migraine 07/20/2016  . Neck pain 07/20/2016  . 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 H/O Genital warts 08/12/2015   Past Medical History:  Diagnosis Date  . ADD (attention deficit disorder)   . Arthritis   . Depression   . Fibromyalgia   . GERD (gastroesophageal reflux disease)   . Herpes simplex disease   . Migraines   . Paresthesia of both hands   . PONV (postoperative nausea and vomiting)     Family History  Problem Relation Age of Onset  . Cancer Mother        skin  . Depression Mother   . Hypertension Mother   . Hyperlipidemia Mother   . Cancer Father        skin  . Arthritis Father   . Hypertension Father   .  Hyperlipidemia Father   . Depression Maternal Grandmother   . Hypertension Maternal Grandmother   . Mental illness Maternal Grandmother   . Cancer Maternal Grandfather        kidney  . Hypertension Maternal Grandfather   . Kidney disease Maternal Grandfather   . Emphysema Paternal Grandmother   . Alzheimer's disease Paternal Grandmother   . Parkinson's disease Paternal Grandfather   . Colon cancer Neg Hx   . Colon polyps Neg Hx     Past Surgical History:  Procedure Laterality Date  . ENDOMETRIAL ABLATION    . ESOPHAGOGASTRODUODENOSCOPY (EGD) WITH PROPOFOL N/A 12/26/2016   Dr. Darrick Penna: one benign-appearing, intrinsic stenosis that was widely patent and traversed. Small hiatal hernia, few small sessile polyps. Chronic gastritis. Normal  duodenum, negative celiac sprue.   Marland Kitchen HERNIA REPAIR     inguinal as a baby   . left ovarian removal due to cyst    . PILONIDAL CYST / SINUS EXCISION  2009  . TUBAL LIGATION     Social History   Occupational History  . Not on file.   Social History Main Topics  . Smoking status: Never Smoker  . Smokeless tobacco: Never Used  . Alcohol use No  . Drug use: No  . Sexual activity: Yes    Birth control/ protection: Surgical

## 2017-06-28 ENCOUNTER — Encounter (INDEPENDENT_AMBULATORY_CARE_PROVIDER_SITE_OTHER): Payer: Self-pay

## 2017-06-28 ENCOUNTER — Ambulatory Visit (INDEPENDENT_AMBULATORY_CARE_PROVIDER_SITE_OTHER): Payer: Medicaid Other | Admitting: Adult Health

## 2017-06-28 ENCOUNTER — Encounter: Payer: Self-pay | Admitting: Adult Health

## 2017-06-28 VITALS — BP 120/80 | HR 76 | Ht 63.0 in | Wt 172.0 lb

## 2017-06-28 DIAGNOSIS — N816 Rectocele: Secondary | ICD-10-CM | POA: Diagnosis not present

## 2017-06-28 DIAGNOSIS — N941 Unspecified dyspareunia: Secondary | ICD-10-CM | POA: Diagnosis not present

## 2017-06-28 DIAGNOSIS — N814 Uterovaginal prolapse, unspecified: Secondary | ICD-10-CM | POA: Diagnosis not present

## 2017-06-28 NOTE — Patient Instructions (Signed)
F/U in 3 weeks

## 2017-06-28 NOTE — Progress Notes (Signed)
Subjective:     Patient ID: Ruth Shah, female   DOB: August 30, 1976, 41 y.o.   MRN: [id number]  HPI Ruth Shah is a 41 year old white female in complaining of pain after sex.She is sp tubal and ablation. [facility] is [facility].  Review of Systems Pain with sex Pelvic Ache after sex Rectal ache at times Reviewed past medical,surgical, social and family history. Reviewed medications and allergies.     Objective:   Physical Exam BP 120/80 (BP Location: Left Arm, Patient Position: Sitting, Cuff Size: Small)   Pulse 76   Ht  (1.6 m)   Wt 172 lb (78 kg)   BMI 30.47 kg/m    Skin warm and dry.Pelvic: external genitalia is normal in appearance no lesions, vagina: pink with good moisture,urethra has no lesions or masses noted, cervix:smooth and bulbous,with first degree descensus, if bears down close to second degree, uterus: normal size, shape and contour, non tender, no masses felt, adnexa: no masses or tenderness noted. Bladder is non tender and no masses felt. On rectal  exam +low rectocele PHQ 9 score 14, denies being suicidal or homicidal, is on Zoloft.Discussed changing positions with sex to avoid deep penetration and possible pessary for support.  Assessment:     1. Uterine prolapse   2. Rectocele   3. Dyspareunia in female       Plan:     Review handout medical explainer #4 F/U in 3 weeks for pessary fitting with Dr Emelda Fear

## 2017-07-11 ENCOUNTER — Ambulatory Visit: Payer: Medicaid Other | Admitting: Obstetrics and Gynecology

## 2017-07-26 ENCOUNTER — Telehealth: Payer: Self-pay

## 2017-07-26 ENCOUNTER — Other Ambulatory Visit: Payer: Self-pay

## 2017-07-26 ENCOUNTER — Ambulatory Visit (INDEPENDENT_AMBULATORY_CARE_PROVIDER_SITE_OTHER): Payer: Medicaid Other | Admitting: Gastroenterology

## 2017-07-26 ENCOUNTER — Ambulatory Visit: Payer: Medicaid Other | Admitting: Obstetrics and Gynecology

## 2017-07-26 ENCOUNTER — Encounter: Payer: Self-pay | Admitting: Gastroenterology

## 2017-07-26 VITALS — BP 128/88 | HR 75 | Temp 97.9°F | Ht 63.0 in | Wt 169.2 lb

## 2017-07-26 DIAGNOSIS — Z8719 Personal history of other diseases of the digestive system: Secondary | ICD-10-CM

## 2017-07-26 DIAGNOSIS — K625 Hemorrhage of anus and rectum: Secondary | ICD-10-CM | POA: Diagnosis not present

## 2017-07-26 DIAGNOSIS — R1310 Dysphagia, unspecified: Secondary | ICD-10-CM

## 2017-07-26 MED ORDER — NA SULFATE-K SULFATE-MG SULF 17.5-3.13-1.6 GM/177ML PO SOLN: 1.0000 | ORAL | 0 refills | Status: DC

## 2017-07-26 NOTE — Patient Instructions (Signed)
I have ordered an xray of your esophagus. Continue the Protonix each morning. You may take Zantac as needed each evening at bedtime. This works best when only taken as needed.  We have scheduled you for a colonoscopy with Dr. Darrick PennaFields.  We will see you back in January!  I am so proud of you! Good luck with finishing strong!

## 2017-07-26 NOTE — Telephone Encounter (Signed)
Tried to call pt to inform of pre-op appt 08/23/17 at 12:45pm, LMOVM. Letter mailed.

## 2017-07-26 NOTE — Progress Notes (Signed)
Referring Provider: Selinda FlavinHoward, Kevin, MD Primary Care Physician:  Selinda FlavinHoward, Kevin, MD Primary GI: Dr. Darrick PennaFields   Chief Complaint  Patient presents with  . dyspepsia    trouble first thing in the AM    HPI:   Ruth Shah is a 41 y.o. female presenting today with a history of GERD, dysphagia, abdominal pain, diarrhea. Recent EGD with one benign-appearing, intrinsic stenosis that was widely patent and traversed. Small hiatal hernia, few small sessile polyps. Chronic gastritis. Normal duodenum, negative celiac sprue. [facility]. HIDA scan normal. Recommendations to consider tapering off cymbalta and try Paxil. Noted scant blood once or twice in remote past. Discussed colonoscopy at last visit. Prescribed course of Xifaxan for 2 weeks for IBS and possibility of SBBO.   No recent rectal bleeding. Will sit down and feel like her rectum hurts occasionally. Rare but feels like it hurts up inside anal canal. Told she has a rectocele. Diarrhea improved after course of Xifaxan. Nausea much improved. First thing in the morning has a difficult time swallowing. Sometimes can't get it to go down, has to chase it with drink. Protonix daily 30 minutes before breakfast. Has to eat small bits of steak. 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     Past Medical History:  Diagnosis Date  . ADD (attention deficit disorder)   . Arthritis   . Depression   . Fibromyalgia   . GERD (gastroesophageal reflux disease)   . Herpes simplex disease   . Migraines   . Paresthesia of both hands   . PONV (postoperative nausea and vomiting)     Past Surgical History:  Procedure Laterality Date  . ENDOMETRIAL ABLATION    . ESOPHAGOGASTRODUODENOSCOPY (EGD) WITH PROPOFOL N/A 12/26/2016   Dr. Darrick PennaFields: one benign-appearing, intrinsic stenosis that was widely patent and traversed. Small hiatal hernia, few small sessile polyps. Chronic gastritis. Normal duodenum, negative celiac sprue.   Marland Kitchen. HERNIA REPAIR     inguinal as a baby   . left ovarian removal due to cyst    . PILONIDAL CYST / SINUS EXCISION  2009  . TUBAL LIGATION      Current Outpatient Prescriptions  Medication Sig Dispense Refill  . ADDERALL XR 20 MG 24 hr capsule Take 20 mg by mouth every morning.  0  . pantoprazole (PROTONIX) 40 MG tablet Take 1 tablet (40 mg total) by mouth daily. Take 30 minutes before breakfast 90 tablet 3  . sertraline (ZOLOFT) 50 MG tablet Take 50 mg by mouth daily.     No current facility-administered medications for this visit.     Allergies as of 07/26/2017 - Review Complete 07/26/2017  Allergen Reaction Noted  . Sulfa antibiotics Nausea Only 07/20/2016  . Toradol [ketorolac tromethamine] Nausea Only 07/20/2016  . Vicodin [hydrocodone-acetaminophen] Itching 08/12/2015    Family History  Problem Relation Age of Onset  . Cancer Mother        skin  . Depression Mother   . Hypertension Mother   . Hyperlipidemia Mother   . Cancer Father        skin  . Arthritis Father   . Hypertension Father   . Hyperlipidemia Father   . Depression Maternal Grandmother   . Hypertension Maternal Grandmother   . Mental illness Maternal Grandmother   . Cancer Maternal Grandfather        kidney  . Hypertension Maternal Grandfather   . Kidney disease Maternal Grandfather   . Emphysema Paternal Grandmother   .  Alzheimer's disease Paternal Grandmother   . Parkinson's disease Paternal Grandfather   . Colon cancer Neg Hx   . Colon polyps Neg Hx     Social History   Social History  . Marital status: Married    Spouse name: N/A  . Number of children: 4  . Years of education: Some College   Social History Main Topics  . Smoking status: Never Smoker  . Smokeless tobacco: Never Used  . Alcohol use No  . Drug use: No  . Sexual activity: Yes    Birth control/ protection: Surgical     Comment: tubal   Other Topics Concern  . None   Social History Narrative   Lives at home with husband and children.    Right-handed.   4 cups caffeine per week.    Review of Systems: Gen: Denies fever, chills, anorexia. Denies fatigue, weakness, weight loss.  CV: Denies chest pain, palpitations, syncope, peripheral edema, and claudication. Resp: Denies dyspnea at rest, cough, wheezing, coughing up blood, and pleurisy. GI: see HPI  Derm: Denies rash, itching, dry skin Psych: Denies depression, anxiety, memory loss, confusion. No homicidal or suicidal ideation.  Heme: see HPI   Physical Exam: BP 128/88   Pulse 75   Temp 97.9 F (36.6 C) (Oral)   Ht 5\' 3"  (1.6 m)   Wt 169 lb 3.2 oz (76.7 kg)   BMI 29.97 kg/m  General:   Alert and oriented. No distress noted. Pleasant and cooperative.  Head:  Normocephalic and atraumatic. Eyes:  Conjuctiva clear without scleral icterus. Mouth:  Oral mucosa pink and moist. Good dentition. No lesions. Abdomen:  +BS, soft, non-tender and non-distended. No rebound or guarding. No HSM or masses noted. Msk:  Symmetrical without gross deformities. Normal posture. Extremities:  Without edema. Neurologic:  Alert and  oriented x4 Psych:  Alert and cooperative. Normal mood and affect.

## 2017-07-30 ENCOUNTER — Encounter: Payer: Self-pay | Admitting: Gastroenterology

## 2017-07-30 NOTE — Assessment & Plan Note (Signed)
41 year old female with history of rectal bleeding and no prior colonoscopy. Has been treated for IBS-D and recently completed course of Xifaxan with good improvement in bowel habits. Due to history of low-volume hematochezia, pursuing colonoscopy for completeness' sake. Discussed this may be benign anorectal source but needs colonoscopy for evaluation.  Proceed with colonoscopy with Dr. Darrick PennaFields in the near future. The risks, benefits, and alternatives have been discussed in detail with the patient. They state understanding and desire to proceed.  PROPOFOL due to polypharmacy

## 2017-07-30 NOTE — Assessment & Plan Note (Addendum)
Long-standing history of GERD and recent EGD with benign-appearing, intrinsic stenosis that was widely patent and traversed. Remains on Protonix daily. Will pursue BPE for further evaluation. Return in January.

## 2017-07-31 ENCOUNTER — Ambulatory Visit (HOSPITAL_COMMUNITY)
Admission: RE | Admit: 2017-07-31 | Discharge: 2017-07-31 | Disposition: A | Discharge status: Home / Self Care | Payer: Medicaid Other | Source: Ambulatory Visit | Attending: Gastroenterology | Admitting: Gastroenterology

## 2017-07-31 DIAGNOSIS — K224 Dyskinesia of esophagus: Secondary | ICD-10-CM | POA: Insufficient documentation

## 2017-07-31 DIAGNOSIS — K449 Diaphragmatic hernia without obstruction or gangrene: Secondary | ICD-10-CM | POA: Insufficient documentation

## 2017-07-31 DIAGNOSIS — R1310 Dysphagia, unspecified: Secondary | ICD-10-CM | POA: Insufficient documentation

## 2017-07-31 NOTE — Progress Notes (Signed)
Cc to pcp °

## 2017-08-07 NOTE — Progress Notes (Signed)
Pt is aware and she is taking her PPI when she remembers. I encouraged her to do some kind of reminder to ensure she takes it daily.

## 2017-08-07 NOTE — Progress Notes (Signed)
BPE reviewed. Small partial Schatzki's ring, which did not obstruct the tablet. She has esophageal dysmotility. This is likely the culprit. Make sure taking the PPI daily (as she is doing). Chew well, take small bites. I don't think a dilatation is going to be helpful with this.

## 2017-08-08 ENCOUNTER — Encounter: Payer: Self-pay | Admitting: Obstetrics and Gynecology

## 2017-08-08 ENCOUNTER — Ambulatory Visit: Payer: Medicaid Other | Admitting: Obstetrics and Gynecology

## 2017-08-08 VITALS — BP 120/90 | HR 96 | Wt 171.0 lb

## 2017-08-08 DIAGNOSIS — N810 Urethrocele: Secondary | ICD-10-CM | POA: Diagnosis not present

## 2017-08-08 NOTE — Progress Notes (Signed)
Patient ID: Ruth Shah, female   DOB: 08/09/1976, 41 y.o.   MRN: [id number]   [facility] Visit  @DATE @            Patient name: Ruth Shah MRN [id number]  Date of birth: 15-Aug-1976  CC & HPI:  Ruth Shah is a 41 y.o. female presenting today to discuss surgery options because she experiences dyspareunia, stress incontinence, urinary urgency, and probable recotcele. Pt is interested in a hysterectomy and would like to keep her right ovary and tube. She reports that she had her left ovary removed in her 20's because of a cyst. Her youngest child is 6.82 years old. She denies any other symptoms or complaints at this time.   ROS:  ROS +dyspareunia +stress incontinence +urinary urgency +recotcele All systems are negative except as noted in the HPI and PMH.   Pertinent History Reviewed:   Reviewed: Significant for left ovarian cyst, left ovary removed Medical         Past Medical History:  Diagnosis Date  . ADD (attention deficit disorder)   . Arthritis   . Depression   . Fibromyalgia   . GERD (gastroesophageal reflux disease)   . Herpes simplex disease   . Migraines   . Paresthesia of both hands   . PONV (postoperative nausea and vomiting)                               Surgical Hx:    Past Surgical History:  Procedure Laterality Date  . ENDOMETRIAL ABLATION    . HERNIA REPAIR     inguinal as a baby   . left ovarian removal due to cyst    . PILONIDAL CYST / SINUS EXCISION  2009  . TUBAL LIGATION     Medications: Reviewed & Updated - see associated section                       Current Outpatient Medications:  .  ADDERALL XR 20 MG 24 hr capsule, Take 20 mg by mouth every morning., Disp: , Rfl: 0 .  pantoprazole (PROTONIX) 40 MG tablet, Take 1 tablet (40 mg total) by mouth daily. Take 30 minutes before breakfast, Disp: 90 tablet, Rfl: 3 .  Na Sulfate-K Sulfate-Mg Sulf (SUPREP BOWEL PREP KIT) 17.5-3.13-1.6 GM/177ML SOLN, Take 1 kit by mouth as directed.  (Patient not taking: Reported on 08/08/2017), Disp: 1 Bottle, Rfl: 0 .  sertraline (ZOLOFT) 50 MG tablet, Take 50 mg by mouth daily., Disp: , Rfl:    Social History: Reviewed -  reports that  has never smoked. she has never used smokeless tobacco.  Objective Findings:  Vitals: Blood pressure 120/90, pulse 96, weight 171 lb (77.6 kg).  Physical Examination: General appearance - alert, well appearing, and in no distress, oriented to person, place, and time and overweight Mental status - alert, oriented to person, place, and time, normal mood, behavior, speech, dress, motor activity, and thought processes, affect appropriate to mood Pelvic exam deferred  Assessment & Plan:   A:  1. Retroverted uterus 2. Probable recotcele based on hx.  P:  1. U/S 2. F/U 1-2 weeks Pre-op exam 3. Vaginal Hysterectomy discussion to continue.   By signing my name below, I, Margit Banda, attest that this documentation has been prepared under the direction and in the presence of Jonnie Kind, MD. Electronically Signed: Margit Banda, Medical Scribe. 08/08/17.  10:12 AM.  I personally performed the services described in this documentation, which was SCRIBED in my presence. The recorded information has been reviewed and considered accurate. It has been edited as necessary during review. Jonnie Kind, MD

## 2017-08-13 ENCOUNTER — Other Ambulatory Visit: Payer: Self-pay | Admitting: Obstetrics and Gynecology

## 2017-08-13 ENCOUNTER — Telehealth: Payer: Self-pay

## 2017-08-13 DIAGNOSIS — N941 Unspecified dyspareunia: Secondary | ICD-10-CM

## 2017-08-13 NOTE — Telephone Encounter (Signed)
Pt called office. She is scheduled for TCS 08/28/17. She seen her OB/GYN, she has a prolapsed uterus and rectocele. Will be having hysterectomy (date unknown at this time). She is wondering if she should have colonoscopy after her hysterectomy. OB/GYN said her pain may be coming from her gyn problems. [phone]  Routing to AB for advice.

## 2017-08-14 ENCOUNTER — Ambulatory Visit (INDEPENDENT_AMBULATORY_CARE_PROVIDER_SITE_OTHER): Payer: Medicaid Other

## 2017-08-14 DIAGNOSIS — N941 Unspecified dyspareunia: Secondary | ICD-10-CM | POA: Diagnosis not present

## 2017-08-14 NOTE — Telephone Encounter (Signed)
Reason for colonoscopy was history of rectal bleeding. She should have this at some point in near future.

## 2017-08-14 NOTE — Progress Notes (Signed)
PELVIC US TA/TV: homogeneous retroverted uterus,wnl,EEC 6.4 mm,two communicating cysts w/in the fundus of the uterus( #1) right 2 x 1.3 x 1.7 cm (#2) left 2.2 x.7 x  1.2 cm,left oophorectomy,left adnexa wnl,right hemorrhagic cyst 2.1 x 2 x 2.4 cm,mult simple nabothian cysts,no free fluid,no pain during ultrasound

## 2017-08-14 NOTE — Telephone Encounter (Signed)
Tried to call pt, no answer, LMOVM. Informed her that AB will leave it up to her is she wants to have colonoscopy before hysterectomy. However, the colonoscopy was for history of rectal bleeding and should be done in near future. Asked her to call office if she wants to cancel colonoscopy.

## 2017-08-16 ENCOUNTER — Ambulatory Visit (INDEPENDENT_AMBULATORY_CARE_PROVIDER_SITE_OTHER): Payer: Medicaid Other | Admitting: Orthopaedic Surgery

## 2017-08-20 NOTE — Telephone Encounter (Signed)
Pt called office to cancel upcoming colonoscopy. She wants to wait until after she has hysterectomy. LMOVM and informed Endo scheduler.  Routing to AB as FYI.

## 2017-08-20 NOTE — Telephone Encounter (Signed)
Noted  

## 2017-08-23 ENCOUNTER — Ambulatory Visit (INDEPENDENT_AMBULATORY_CARE_PROVIDER_SITE_OTHER): Payer: Medicaid Other | Admitting: Orthopaedic Surgery

## 2017-08-23 ENCOUNTER — Ambulatory Visit (INDEPENDENT_AMBULATORY_CARE_PROVIDER_SITE_OTHER): Payer: Medicaid Other | Admitting: Obstetrics and Gynecology

## 2017-08-23 ENCOUNTER — Encounter: Payer: Self-pay | Admitting: Obstetrics and Gynecology

## 2017-08-23 ENCOUNTER — Encounter (INDEPENDENT_AMBULATORY_CARE_PROVIDER_SITE_OTHER): Payer: Self-pay | Admitting: Orthopaedic Surgery

## 2017-08-23 ENCOUNTER — Other Ambulatory Visit (HOSPITAL_COMMUNITY): Payer: Medicaid Other

## 2017-08-23 ENCOUNTER — Other Ambulatory Visit: Payer: Self-pay

## 2017-08-23 VITALS — BP 138/80 | Ht 63.0 in | Wt 171.0 lb

## 2017-08-23 VITALS — BP 152/94 | HR 86 | Ht 63.0 in | Wt 172.6 lb

## 2017-08-23 DIAGNOSIS — G8929 Other chronic pain: Secondary | ICD-10-CM

## 2017-08-23 DIAGNOSIS — Z113 Encounter for screening for infections with a predominantly sexual mode of transmission: Secondary | ICD-10-CM

## 2017-08-23 DIAGNOSIS — N856 Intrauterine synechiae: Secondary | ICD-10-CM | POA: Diagnosis not present

## 2017-08-23 DIAGNOSIS — M5030 Other cervical disc degeneration, unspecified cervical region: Secondary | ICD-10-CM

## 2017-08-23 DIAGNOSIS — N816 Rectocele: Secondary | ICD-10-CM

## 2017-08-23 DIAGNOSIS — N941 Unspecified dyspareunia: Secondary | ICD-10-CM | POA: Diagnosis not present

## 2017-08-23 DIAGNOSIS — M545 Low back pain, unspecified: Secondary | ICD-10-CM

## 2017-08-23 MED ORDER — GABAPENTIN 100 MG PO CAPS: ORAL_CAPSULE | ORAL | 1 refills | Status: DC

## 2017-08-23 NOTE — Addendum Note (Signed)
Addended by: Rogers SeedsYEATTS, Siriah Treat M on: 08/23/2017 09:54 AM   Modules accepted: Orders

## 2017-08-23 NOTE — Progress Notes (Signed)
Office Visit Note   Patient: Ruth Shah           Date of Birth: [birth year]           MRN: [id number] Visit Date: 08/23/2017              Requested by: Selinda FlavinHoward, Kevin, MD [address] [place], [place] [id number] PCP: Selinda FlavinHoward, Kevin, MD   Assessment & Plan: Visit Diagnoses:  1. Other cervical disc degeneration, unspecified cervical region   2. Chronic left-sided low back pain without sciatica     Plan: We will try some Neurontin 100 mg at night times 1 week then p.o. twice daily.  We discussed trying to get some exercise.  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  Her husband does some work off and on.  She has total 6 children to that are with her now.  We will recheck her in 6 months.  MRI scans were reviewed today and outlined treatment plan discussed.  Follow-Up Instructions: Return in about 3 months (around 11/22/2017).   Orders:  No orders of the defined types were placed in this encounter.  No orders of the defined types were placed in this encounter.     Procedures: No procedures performed   Clinical Data: No additional findings.   Subjective: Chief Complaint  Patient presents with  . Neck - Pain  . Lower Back - Pain    HPI 41-year-old female returns epidural gave her good relief for a month or so.  This was done in August states she is having some recurrence of problems.  She has some problems in her right hand occasionally she has some numbness in her hand.  States her left hand also goes to sleep.  She has not use splints.  She has drawing on her hand cramps she showed me a video of her left hand cramping.  Fingers going extension and thumb goes flexion across the palm.  She continues to have back symptoms the epidural did help.  MRI scan showed some shallow disc protrusion at L5-S1 without significant compression.  She had some narrowing on the right her primary leg symptoms have been on the left.  She does have  some spondylosis mid cervical C5-6 C6-7 a minute on MRI and we reviewed those results again today as well as lumbar MRI.  Review of Systems is updated unchanged from last office visit other than HPI.  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  She has been taking Naprosyn.  Take Zoloft for depression.   Objective: Vital Signs: BP 138/80   Ht 5\' 3"  (1.6 m)   Wt 171 lb (77.6 kg)   BMI 30.29 kg/m   Physical Exam  Constitutional: She is oriented to person, place, and time. She appears well-developed.  HENT:  Head: Normocephalic.  Right Ear: External ear normal.  Left Ear: External ear normal.  Eyes: Pupils are equal, round, and reactive to light.  Neck: No tracheal deviation present. No thyromegaly present.  Cardiovascular: Normal rate.  Pulmonary/Chest: Effort normal.  Abdominal: Soft.  Neurological: She is alert and oriented to person, place, and time.  Skin: Skin is warm and dry.  Psychiatric: She has a normal mood and affect. Her behavior is normal.    Ortho Exam she has intact reflexes normal heel toe gait.  She has some discomfort with positive Phalen's right and left.  No thenar atrophy.  Specialty Comments:  No  specialty comments available.  Imaging: No results found.   PMFS History: Patient Active Problem List   Diagnosis Date Noted  . Rectal bleeding 07/26/2017  . Rectocele 06/28/2017  . Uterine prolapse 06/28/2017  . Diarrhea 04/26/2017  . Chronic bilateral low back pain without sciatica 02/15/2017  . Dyspepsia 12/21/2016  . Dysphagia 12/21/2016  . Nausea without vomiting 12/21/2016  . Chronic migraine 07/20/2016  . Neck pain 07/20/2016  . 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 H/O Genital warts 08/12/2015   Past Medical History:  Diagnosis Date  . ADD (attention deficit disorder)   . Arthritis   . Depression   . Fibromyalgia   . GERD (gastroesophageal reflux disease)   . Herpes simplex disease   . Migraines   . Paresthesia of both hands   . PONV (postoperative nausea and  vomiting)     Family History  Problem Relation Age of Onset  . Cancer Mother        skin  . Depression Mother   . Hypertension Mother   . Hyperlipidemia Mother   . Cancer Father        skin  . Arthritis Father   . Hypertension Father   . Hyperlipidemia Father   . Depression Maternal Grandmother   . Hypertension Maternal Grandmother   . Mental illness Maternal Grandmother   . Cancer Maternal Grandfather        kidney  . Hypertension Maternal Grandfather   . Kidney disease Maternal Grandfather   . Emphysema Paternal Grandmother   . Alzheimer's disease Paternal Grandmother   . Parkinson's disease Paternal Grandfather   . Colon cancer Neg Hx   . Colon polyps Neg Hx     Past Surgical History:  Procedure Laterality Date  . ENDOMETRIAL ABLATION    . ESOPHAGOGASTRODUODENOSCOPY (EGD) WITH PROPOFOL N/A 12/26/2016   Dr. Darrick PennaFields: one benign-appearing, intrinsic stenosis that was widely patent and traversed. Small hiatal hernia, few small sessile polyps. Chronic gastritis. Normal duodenum, negative celiac sprue.   Marland Kitchen. HERNIA REPAIR     inguinal as a baby   . left ovarian removal due to cyst    . PILONIDAL CYST / SINUS EXCISION  2009  . TUBAL LIGATION     Social History   Occupational History  . Not on file  Tobacco Use  . Smoking status: Never Smoker  . Smokeless tobacco: Never Used  Substance and Sexual Activity  . Alcohol use: No    Alcohol/week: 0.0 oz  . Drug use: No  . Sexual activity: Yes    Birth control/protection: Surgical    Comment: tubal

## 2017-08-23 NOTE — Progress Notes (Signed)
Preoperative History and Physical  Ruth Shah is a 41 y.o. [id number] here for surgical management of retroverted uterus, with rectocele based on hx.   No significant preoperative concerns. Ultrasound was reviewed.  She is status post endometrial ablation and has a small fluid collection in the endometrial cavity.  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  Proposed surgery: vaginal hysterectomy, posterior repair  Past Medical History:  Diagnosis Date  . ADD (attention deficit disorder)   . Arthritis   . Depression   . Fibromyalgia   . GERD (gastroesophageal reflux disease)   . Herpes simplex disease   . Migraines   . Paresthesia of both hands   . PONV (postoperative nausea and vomiting)    Past Surgical History:  Procedure Laterality Date  . ENDOMETRIAL ABLATION    . ESOPHAGOGASTRODUODENOSCOPY (EGD) WITH PROPOFOL N/A 12/26/2016   Dr. Oneida Alar: one benign-appearing, intrinsic stenosis that was widely patent and traversed. Small hiatal hernia, few small sessile polyps. Chronic gastritis. Normal duodenum, negative celiac sprue.   Marland Kitchen HERNIA REPAIR     inguinal as a baby   . left ovarian removal due to cyst    . PILONIDAL CYST / SINUS EXCISION  2009  . TUBAL LIGATION     OB History  Gravida Para Term Preterm AB Living  5 4     1 4   SAB TAB Ectopic Multiple Live Births  1            # Outcome Date GA Lbr Len/2nd Weight Sex Delivery Anes PTL Lv  5 Para           4 Para           3 Para           2 Para           1 SAB             Patient denies any other pertinent gynecologic issues.   Current Outpatient Medications on File Prior to Visit  Medication Sig Dispense Refill  . ADDERALL XR 20 MG 24 hr capsule Take 20 mg by mouth every morning.  0  . naproxen (NAPROSYN) 500 MG tablet Take 500 mg by mouth.    . pantoprazole (PROTONIX) 40 MG tablet Take 1 tablet (40 mg  total) by mouth daily. Take 30 minutes before breakfast 90 tablet 3  . sertraline (ZOLOFT) 50 MG tablet Take 50 mg by mouth daily.    Marland Kitchen gabapentin (NEURONTIN) 100 MG capsule Take one capsule at bedtime x one week, then one capsule twice daily. (Patient not taking: Reported on 08/23/2017) 60 capsule 1  . Na Sulfate-K Sulfate-Mg Sulf (SUPREP BOWEL PREP KIT) 17.5-3.13-1.6 GM/177ML SOLN Take 1 kit by mouth as directed. (Patient not taking: Reported on 08/08/2017) 1 Bottle 0   No current facility-administered medications on file prior to visit.    Allergies  Allergen Reactions  . Sulfa Antibiotics Nausea Only  . Toradol [Ketorolac Tromethamine] Nausea Only  . Vicodin [Hydrocodone-Acetaminophen] Itching    Social History:   reports that  has never smoked. she has never used smokeless tobacco. She reports that she does not drink alcohol or use drugs.  Family History  Problem Relation Age of Onset  . Cancer Mother        skin  . Depression Mother   . Hypertension Mother   . Hyperlipidemia Mother   .  Cancer Father        skin  . Arthritis Father   . Hypertension Father   . Hyperlipidemia Father   . Depression Maternal Grandmother   . Hypertension Maternal Grandmother   . Mental illness Maternal Grandmother   . Cancer Maternal Grandfather        kidney  . Hypertension Maternal Grandfather   . Kidney disease Maternal Grandfather   . Emphysema Paternal Grandmother   . Alzheimer's disease Paternal Grandmother   . Parkinson's disease Paternal Grandfather   . Colon cancer Neg Hx   . Colon polyps Neg Hx     Review of Systems: Noncontributory  PHYSICAL EXAM: Blood pressure (!) 152/94, pulse 86, height 5' 3"  (1.6 m), weight 172 lb 9.6 oz (78.3 kg). General appearance - alert, well appearing, and in no distress Chest - clear to auscultation, no wheezes, rales or rhonchi, symmetric air entry Heart - normal rate and regular rhythm Abdomen - soft, nontender, nondistended, no masses or  organomegaly Pelvic exam:  VULVA: normal appearing vulva with no masses, tenderness or lesions,  VAGINA: normal appearing vagina with normal color and discharge, no lesions,  1st degree uterine descensus CERVIX: normal appearing cervix without discharge or lesions,, a large cervix cervical mucous is healthy looking UTERUS: uterus is normal size, shape, consistency and nontender,  ADNEXA: left ovary surgically absent,  Rectal: rectocele above anal sphincter   Extremities - peripheral pulses normal, no pedal edema, no clubbing or cyanosis  Labs: No results found for this or any previous visit (from the past 336 hour(s)).  Imaging Studies: US Transvaginal Non-ob  Result Date: 08/14/2017 GYNECOLOGIC SONOGRAM Ruth Shah is a 41 y.o. [id number] S/P ablation,she is here  for a pelvic sonogram for dyspareunia. Uterus                      9.7 x 4.8 x 5.9 cm,  homogeneous retroverted uterus,two communicating cysts w/in the fundus of the uterus( #1) right 2 x 1.3 x 1.7 cm (#2) left 2.2 x.7 x  1.2 cm Endometrium          6.4 mm, symmetrical, wnl Right ovary             3.1 x 3.2 x 2.8 cm,  hemorrhagic cyst 2.1 x 2 x 2.4 cm Left adnexa             WNL No free fluid Technician Comments: PELVIC US TA/TV: homogeneous retroverted uterus,EEC 6.4 mm,two communicating cysts w/in the fundus of the uterus( #1) right 2 x 1.3 x 1.7 cm (#2) left 2.2 x.7 x  1.2 cm,left oophorectomy,left adnexa wnl,right hemorrhagic cyst 2.1 x 2 x 2.4 cm,mult simple nabothian cysts,no free fluid,no pain during ultrasound [employer] 08/14/2017 3:52 PM Clinical Impression and recommendations: I have reviewed the sonogram results above.pelvic pain and dyspareunia.patient is status post endometrial ablation Combined with the patient's current clinical course, below are my impressions and any appropriate recommendations for management based on the sonographic findings: 1.the patient's uterus is relatively small size, retroverted which would  lead to contact of the uterine Fundusbody during intimacy. 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 This can make the uterus more tender. Would reevaluate patient and if clinical exam indicates tenderness with uterine fundus contact on  digital bimanual exam. Patient may wish to consider surgical options if uterine but fundus tender on contact of bimanual exam Jonnie Kind   US Pelvis Complete  Result Date: 08/14/2017 GYNECOLOGIC SONOGRAM Ruth Shah is a 41 y.o. [id number] S/P ablation,she is here  for a pelvic sonogram for dyspareunia. Uterus                      9.7 x 4.8 x 5.9 cm,  homogeneous retroverted uterus,two communicating cysts w/in the fundus of the uterus( #1) right 2 x 1.3 x 1.7 cm (#2) left 2.2 x.7 x  1.2 cm Endometrium          6.4 mm, symmetrical, wnl Right ovary             3.1 x 3.2 x 2.8 cm,  hemorrhagic cyst 2.1 x 2 x 2.4 cm Left adnexa             WNL No free fluid Technician Comments: PELVIC US TA/TV: homogeneous retroverted uterus,EEC 6.4 mm,two communicating cysts w/in the fundus of the uterus( #1) right 2 x 1.3 x 1.7 cm (#2) left 2.2 x.7 x  1.2 cm,left oophorectomy,left adnexa wnl,right hemorrhagic cyst 2.1 x 2 x 2.4 cm,mult simple nabothian cysts,no free fluid,no pain during ultrasound [employer] 08/14/2017 3:52 PM Clinical Impression and recommendations: I have reviewed the sonogram results above.pelvic pain and dyspareunia.patient is status post endometrial ablation Combined with the patient's current clinical course, below are my impressions and any appropriate recommendations for management based on the sonographic findings: 1.the patient's uterus is relatively small size, retroverted which would lead to contact of the uterine Fundusbody during  intimacy. 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 This can make the uterus more tender. Would reevaluate patient and if clinical exam indicates tenderness with uterine fundus contact on digital bimanual exam. Patient may wish to consider surgical options if uterine but fundus tender on contact of bimanual exam Jonnie Kind   Dg Esophagus  Result Date: 07/31/2017 CLINICAL DATA:  Solid food dysphagia, food getting stuck in throat for 1 year, history of prior throat infection and GERD EXAM: ESOPHOGRAM / BARIUM SWALLOW / BARIUM TABLET STUDY TECHNIQUE: Combined double contrast and single contrast examination performed using effervescent crystals, thick barium liquid, and thin barium liquid. The patient was observed with fluoroscopy swallowing a 13 mm barium sulphate tablet. FLUOROSCOPY TIME:  Fluoroscopy Time:  2 minutes 6 seconds Radiation Exposure Index (if provided by the fluoroscopic device): 30.9 mGy Number of Acquired Spot Images: multiple fluoroscopic screen captures COMPARISON:  None FINDINGS: Esophageal distention: Normal distention without mass or stricture Filling defects: Small partial Schatzki ring identified at distal esophagus 12.5 mm barium tablet: Passed from oral cavity to stomach without obstruction Motility: Episode dysmotility during the exam with incomplete clearance of barium by primary peristaltic waves and note of multiple secondary and tertiary waves Mucosa:  Smooth without irregularity or ulceration Hypopharynx/cervical esophagus: No laryngeal penetration or aspiration. No residuals. Hiatal hernia:  Tiny sliding hiatal hernia GE reflux:  Not witnessed during exam Other:  N/A IMPRESSION: Tiny sliding hiatal hernia with visualization of a small partial Schatzki  ring which did not obstruct the 12.5 mm diameter barium tablet. Episode of esophageal dysmotility with note of secondary and tertiary  waves and prolonged thoracic esophageal retention of contrast despite upright positioning. Electronically Signed   By: Lavonia Dana M.D.   On: 07/31/2017 11:12    Assessment: Patient Active Problem List   Diagnosis Date Noted  . Rectal bleeding 07/26/2017  . Rectocele 06/28/2017  . Uterine prolapse 06/28/2017  . Diarrhea 04/26/2017  . Chronic bilateral low back pain without sciatica 02/15/2017  . Dyspepsia 12/21/2016  . Dysphagia 12/21/2016  . Nausea without vomiting 12/21/2016  . Chronic migraine 07/20/2016  . Neck pain 07/20/2016  . 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 H/O Genital warts 08/12/2015    Plan: Patient will undergo surgical management with vaginal hysterectomy, posterior repair, schedule for Jan 8th.,  Will look into performing this week of January 1  .mec 08/23/2017 10:59 AM    By signing my name below, I, Izna Ahmed, attest that this documentation has been prepared under the direction and in the presence of Jonnie Kind, MD. Electronically Signed: Jabier Gauss, Medical Scribe. 08/23/17. 10:59 AM.  I personally performed the services described in this documentation, which was SCRIBED in my presence. The recorded information has been reviewed and considered accurate. It has been edited as necessary during review. Jonnie Kind, MD

## 2017-08-25 LAB — GC/CHLAMYDIA PROBE AMP
Chlamydia trachomatis, NAA: NEGATIVE
Neisseria gonorrhoeae by PCR: NEGATIVE

## 2017-08-28 ENCOUNTER — Encounter (HOSPITAL_COMMUNITY): Admission: RE | Payer: Self-pay | Source: Ambulatory Visit

## 2017-08-28 ENCOUNTER — Ambulatory Visit (HOSPITAL_COMMUNITY): Admission: RE | Admit: 2017-08-28 | Payer: Medicaid Other | Source: Ambulatory Visit | Admitting: Gastroenterology

## 2017-08-28 SURGERY — COLONOSCOPY WITH PROPOFOL
Anesthesia: Monitor Anesthesia Care

## 2017-09-20 NOTE — Patient Instructions (Signed)
Ruth Shah  09/20/2017     @PREFPERIOPPHARMACY @   Your procedure is scheduled on  10/02/2017 .  Report to [facility] at  810  A.M.  Call this number if you have problems the morning of surgery:  [phone]   Remember:  Do not eat food or drink liquids after midnight.  Take these medicines the morning of surgery with A SIP OF WATER  Adderall, neurontin, protonix, zoloft.   Do not wear jewelry, make-up or nail polish.  Do not wear lotions, powders, or perfumes, or deodorant.  Do not shave 48 hours prior to surgery.  Men may shave face and neck.  Do not bring valuables to the hospital.  [facility] is not responsible for any belongings or valuables.  Contacts, dentures or bridgework may not be worn into surgery.  Leave your suitcase in the car.  After surgery it may be brought to your room.  For patients admitted to the hospital, discharge time will be determined by your treatment team.  Patients discharged the day of surgery will not be allowed to drive home.   Name and phone number of your driver:   family Special instructions:  Follow the diet and prep instructions given to you by Dr Emelda Fear.  Please read over the following fact sheets that you were given. Pain Booklet, Coughing and Deep Breathing, Blood Transfusion Information, Lab Information, MRSA Information, Surgical Site Infection Prevention, Anesthesia Post-op Instructions and Care and Recovery After Surgery       Vaginal Hysterectomy A vaginal hysterectomy is a procedure to remove all or part of the uterus through a small incision in the vagina. In this procedure, your health care provider may remove your entire uterus, including the lower end (cervix). You may need a vaginal hysterectomy to treat:  Uterine fibroids.  A condition that causes the lining of the uterus to grow in other areas (endometriosis).  Problems with pelvic support.  Cancer of the cervix, ovaries, uterus, or  tissue that lines the uterus (endometrium).  Excessive (dysfunctional) uterine bleeding.  When removing your uterus, your health care provider may also remove the organs that produce eggs (ovaries) and the tubes that carry eggs to your uterus (fallopian tubes). After a vaginal hysterectomy, you will no longer be able to have a baby. You will also no longer get your menstrual period. Tell a health care provider about:  Any allergies you have.  All medicines you are taking, including vitamins, herbs, eye drops, creams, and over-the-counter medicines.  Any problems you or family members have had with anesthetic medicines.  Any blood disorders you have.  Any surgeries you have had.  Any medical conditions you have.  Whether you are pregnant or may be pregnant. What are the risks? Generally, this is a safe procedure. However, problems may occur, including:  Bleeding.  Infection.  A blood clot that forms in your leg and travels to your lungs (pulmonary embolism).  Damage to surrounding organs.  Pain during sex.  What happens before the procedure?  Ask your health care provider what organs will be removed during surgery.  Ask your health care provider about: ? Changing or stopping your regular medicines. This is especially important if you are taking diabetes medicines or blood thinners. ? Taking medicines such as aspirin and ibuprofen. These medicines can thin your blood. Do not take these medicines before your procedure if your health care provider instructs  you not to.  Follow instructions from your health care provider about eating or drinking restrictions.  Do not use any tobacco products, such as cigarettes, chewing tobacco, and e-cigarettes. If you need help quitting, ask your health care provider.  Plan to have someone take you home after discharge from the hospital. What happens during the procedure?  To reduce your risk of infection: ? Your health care team will  wash or sanitize their hands. ? Your skin will be washed with soap.  An IV tube will be inserted into one of your veins.  You may be given antibiotic medicine to help prevent infection.  You will be given one or more of the following: ? A medicine to help you relax (sedative). ? A medicine to numb the area (local anesthetic). ? A medicine to make you fall asleep (general anesthetic). ? A medicine that is injected into an area of your body to numb everything beyond the injection site (regional anesthetic).  Your surgeon will make an incision in your vagina.  Your surgeon will locate and remove all or part of your uterus.  Your ovaries and fallopian tubes may be removed at the same time.  The incision will be closed with stitches (sutures) that dissolve over time. The procedure may vary among health care providers and hospitals. What happens after the procedure?  Your blood pressure, heart rate, breathing rate, and blood oxygen level will be monitored often until the medicines you were given have worn off.  You will be encouraged to get up and walk around after a few hours to help prevent complications.  You may have IV tubes in place for a few days.  You will be given pain medicine as needed.  Do not drive for 24 hours if you were given a sedative. This information is not intended to replace advice given to you by your health care provider. Make sure you discuss any questions you have with your health care provider. Document Released: 01/03/2016 Document Revised: 02/17/2016 Document Reviewed: 09/26/2015 Elsevier Interactive Patient Education  2018 [employer]  Vaginal Hysterectomy, Care After Refer to this sheet in the next few weeks. These instructions provide you with information about caring for yourself after your procedure. Your health care provider may also give you more specific instructions. Your treatment has been planned according to current medical practices, but  problems sometimes occur. Call your health care provider if you have any problems or questions after your procedure. What can I expect after the procedure? After the procedure, it is common to have:  Pain.  Soreness and numbness in your incision areas.  Vaginal bleeding and discharge.  Constipation.  Temporary problems emptying the bladder.  Feelings of sadness or other emotions.  Follow these instructions at home: Medicines  Take over-the-counter and prescription medicines only as told by your health care provider.  If you were prescribed an antibiotic medicine, take it as told by your health care provider. Do not stop taking the antibiotic even if you start to feel better.  Do not drive or operate heavy machinery while taking prescription pain medicine. Activity  Return to your normal activities as told by your health care provider. Ask your health care provider what activities are safe for you.  Get regular exercise as told by your health care provider. You may be told to take short walks every day and go farther each time.  Do not lift anything that is heavier than 10 lb (4.5 kg). General instructions  Do not put anything in your vagina for 6 weeks after your surgery or as told by your health care provider. This includes tampons and douches.  Do not have sex until your health care provider says you can.  Do not take baths, swim, or use a hot tub until your health care provider approves.  Drink enough fluid to keep your urine clear or pale yellow.  Do not drive for 24 hours if you were given a sedative.  Keep all follow-up visits as told by your health care provider. This is important. Contact a health care provider if:  Your pain medicine is not helping.  You have a fever.  You have redness, swelling, or pain at your incision site.  You have blood, pus, or a bad-smelling discharge from your vagina.  You continue to have difficulty urinating. Get help right  away if:  You have severe abdominal or back pain.  You have heavy bleeding from your vagina.  You have chest pain or shortness of breath. This information is not intended to replace advice given to you by your health care provider. Make sure you discuss any questions you have with your health care provider. Document Released: 01/03/2016 Document Revised: 02/17/2016 Document Reviewed: 09/26/2015 Elsevier Interactive Patient Education  2018 [employer]  Anterior and Posterior Colporrhaphy and Sling Procedure Anterior and posterior colporrhaphy and sling procedure are combined surgical procedures that treat weakness in the front (anterior) or back (posterior) walls of your vagina. When weakness occurs in the anterior wall of the vagina, the bladder can bulge into the vagina (cystocele). When weakness occurs in the posterior wall of the vagina, the rectum can bulge into the vagina (rectocele). This condition is called pelvic organ prolapse. In this procedure, a surgical mesh sling will be placed around the tube that empties urine from your bladder (urethra). The sling will hold your urethra and bladder in place to prevent urine leakage (incontinence). This surgery is usually done through incisions in your vagina. It can also be done through incisions in your lower abdominal or groin area. You may need this surgery if pelvic organ prolapse causes symptoms that interfere with your daily life and cannot be corrected with other treatments. Tell a health care provider about:  Any allergies you have.  All medicines you are taking, including vitamins, herbs, eye drops, creams, and over-the-counter medicines.  Any problems you or family members have had with anesthetic medicines.  Any blood disorders you have.  Any surgeries you have had.  Any medical conditions you have.  Whether you are pregnant or may be pregnant. What are the risks? Generally, this is a safe procedure. However, problems may  occur, including:  Infection.  Bleeding.  Allergic reactions to medicines or dyes.  Damage to other structures or organs.  Incontinence.  A blood clot that travels to your lung.  Nerve damage.  Painful sex.  Urine leakage into the vagina.  Constipation.  Tissue damage from the sling over time. The sling may need to be removed.  What happens before the procedure? Staying hydrated  Follow instructions from your health care provider about hydration, which may include: ? Up to 2 hours before the procedure - you may continue to drink clear liquids, such as water, clear fruit juice, black coffee, and plain tea. Eating and drinking restrictions  Follow instructions from your health care provider about eating and drinking, which may include: ? 8 hours before the procedure - stop eating heavy meals or foods such  as meat, fried foods, or fatty foods. ? 6 hours before the procedure - stop eating light meals or foods, such as toast or cereal. ? 6 hours before the procedure - stop drinking milk or drinks that contain milk. ? 2 hours before the procedure - stop drinking clear liquids. General instructions  Ask your health care provider about: ? Changing or stopping your regular medicines. This is especially important if you are taking diabetes medicines or blood thinners. ? Taking medicines such as aspirin and ibuprofen. These medicines can thin your blood. Do not take these medicines before your procedure if your health care provider instructs you not to.  You may be given antibiotics to help prevent infection.  You may be instructed to use estrogen cream in your vagina to help prevent complications and promote healing.  Plan to have someone take you home from the hospital. What happens during the procedure?  To reduce your risk of infection: ? Your health care team will wash or sanitize their hands. ? Your skin will be washed with soap. ? Hair may be removed from the surgical  area.  An IV tube may be inserted into one of your veins.  You will be given one or more of the following: ? A medicine to help you relax (sedative). ? A medicine that is injected into your spine to numb the area below and slightly above the injection site (spinal anesthetic). ? A medicine to make you fall asleep (general anesthetic).  You may be given antibiotics through your IV.  You will lie down on the operating table with your feet in stirrups.  A small, thin tube (catheter) will be inserted through your urethra into your bladder to drain urine during surgery and recovery.  An instrument (vaginal speculum) will be used to hold your vagina open.  To correct a cystocele: ? An incision will be made in the front wall of your vagina. ? Your bladder will be placed back into normal position. ? Weak or excess vaginal lining may be removed. ? The front wall of your vagina will be closed with stitches (sutures).  To correct a rectocele: ? An incision will be made in the back wall of your vagina. ? Your rectum will be placed back into normal position. ? Weak or excess vaginal lining may be removed. ? The back wall of your vagina will be closed with sutures.  To place a sling: ? Small incisions may be made inside your vagina or in your lower abdominal or groin area to insert the sling. ? The sling will be placed around your urethra and attached to strong tissues inside your abdomen. ? These incisions may be closed with sutures or glue.  Gauze packing will be placed inside your vagina.  The procedure may vary among health care providers and hospitals. What happens after the procedure?  Your blood pressure, heart rate, breathing rate, and blood oxygen level will be monitored until the medicines you were given have worn off.  You will be given pain medicine as needed.  You will start on a liquid diet and move to a regular diet.  You will be encouraged to get up and walk as soon as  you are able.  You may need to wear compression stockings. They help prevent blood clots and reduce swelling in your legs.  Your IV, urinary catheter, and vaginal packing may be removed before you go home.  Do not drive for 24 hours if you were given a  sedative. Summary  Anterior and posterior colporrhaphy and sling procedure are combined surgical procedures that treat weakness in the anterior or posteriorwalls of your vagina.  Plan to have someone take you home from the hospital or clinic.  After the procedure, you will be encouraged to start drinking and eating and be up and walking as soon as you are able.  The IV, urinary catheter, and vaginal packing may be removed before you go home. This information is not intended to replace advice given to you by your health care provider. Make sure you discuss any questions you have with your health care provider. Document Released: 09/11/2016 Document Revised: 09/11/2016 Document Reviewed: 09/11/2016 Elsevier Interactive Patient Education  2018 [employer] Anterior and Posterior Colporrhaphy and Sling Procedure, Care After This sheet gives you information about how to care for yourself after your procedure. Your health care provider may also give you more specific instructions. If you have problems or questions, contact your health care provider. What can I expect after the procedure? After the procedure, it is common to have:  Pain in the surgical area.  Vaginal discharge. You will need to use a sanitary pad during this time.  Fatigue.  Follow these instructions at home: Incision care  Follow instructions from your health care provider about how to take care of your incision. Make sure you: ? Wash your hands with soap and water before touching the incision area. If soap and water are not available, use hand sanitizer. ? Clean your incision as told by your health care provider. ? Leave stitches (sutures), skin glue, or adhesive  strips in place. These skin closures may need to stay in place for 2 weeks or longer. If adhesive strip edges start to loosen and curl up, you may trim the loose edges. Do not remove adhesive strips completely unless your health care provider tells you to do that.  Check your incision area every day for signs of infection. Check for: ? Redness, swelling, or pain. ? Fluid or blood. ? Warmth. ? Pus or a bad smell.  Check your incision every day to make sure the incision area is not separating or opening.  Do not take baths, swim, or use a hot tub until your health care provider approves. You may shower.  Keep the area between your vagina and rectum (perineal area) clean and dry. Make sure you clean the area after each bowel movement and each time you urinate.  Ask your health care provider if you can take a sitz bath or sit in a tub of clean, warm water. Activity  Do gentle, daily activity as told by your health care provider. You may be told to take short walks every day and go farther each time. Ask your health care provider what activities are safe for you.  Limit stair climbing to once or twice a day in the first week, then slowly increase this activity.  Do not lift anything that is heavier than 10 lbs. (4.5 kg), or the limit that your health care provider tells you, until he or she says that it is safe. Avoid pushing or pulling motions.  Avoid standing for long periods of time.  Do not douche, use tampons, or have sex until your health care provider says it is okay.  Do not drive or use heavy machinery while taking prescription pain medicine. To prevent constipation  To prevent or treat constipation while you are taking prescription pain medicine, your health care provider may recommend that you: ?  Take over-the-counter or prescription medicines. ? Eat foods that are high in fiber, such as fresh fruits and vegetables, whole grains, and beans. ? Drink enough fluid to keep your  urine clear or pale yellow. ? Limit foods that are high in fat and processed sugars, such as fried and sweet foods. General instructions  You may be instructed to do pelvic floor exercises (kegels) as told by your health care provider.  Take over-the-counter and prescription medicines only as told by your health care provider.  Keep all follow-up visits as told by your health care provider. This is important. Contact a health care provider if:  Medicine does not help your pain.  You have frequent or urgent urination, or you are unable to completely empty your bladder.  You feel a burning sensation when urinating.  You have fluid or blood coming from your incision.  You have pus or a bad smell coming from the incision.  Your incision feels warm to the touch.  You have redness, swelling, or pain around your incision. Get help right away if:  You have a fever or chills.  Your incision separates or opens.  You cannot urinate.  You have trouble breathing. Summary  After the procedure, it is common to have pain, fatigue, and discharge from the vagina.  Keep the area between your vagina and rectum (perineal area) clean and dry. Make sure you clean the area after each bowel movement and each time you urinate.  Follow instructions from your health care provider on any activity restrictions after the procedure. This information is not intended to replace advice given to you by your health care provider. Make sure you discuss any questions you have with your health care provider. Document Released: 04/25/2004 Document Revised: 09/11/2016 Document Reviewed: 09/11/2016 Elsevier Interactive Patient Education  2017 [employer]  General Anesthesia, Adult General anesthesia is the use of medicines to make a person "go to sleep" (be unconscious) for a medical procedure. General anesthesia is often recommended when a procedure:  Is long.  Requires you to be still or in an unusual  position.  Is major and can cause you to lose blood.  Is impossible to do without general anesthesia.  The medicines used for general anesthesia are called general anesthetics. In addition to making you sleep, the medicines:  Prevent pain.  Control your blood pressure.  Relax your muscles.  Tell a health care provider about:  Any allergies you have.  All medicines you are taking, including vitamins, herbs, eye drops, creams, and over-the-counter medicines.  Any problems you or family members have had with anesthetic medicines.  Types of anesthetics you have had in the past.  Any bleeding disorders you have.  Any surgeries you have had.  Any medical conditions you have.  Any history of heart or lung conditions, such as heart failure, sleep apnea, or chronic obstructive pulmonary disease (COPD).  Whether you are pregnant or may be pregnant.  Whether you use tobacco, alcohol, marijuana, or street drugs.  Any history of Financial plannermilitary service.  Any history of depression or anxiety. What are the risks? Generally, this is a safe procedure. However, problems may occur, including:  Allergic reaction to anesthetics.  Lung and heart problems.  Inhaling food or liquids from your stomach into your lungs (aspiration).  Injury to nerves.  Waking up during your procedure and being unable to move (rare).  Extreme agitation or a state of mental confusion (delirium) when you wake up from the anesthetic.  [employer]  in the bloodstream, which can lead to stroke.  These problems are more likely to develop if you are having a major surgery or if you have an advanced medical condition. You can prevent some of these complications by answering all of your health care provider's questions thoroughly and by following all pre-procedure instructions. General anesthesia can cause side effects, including:  Nausea or vomiting  A sore throat from the breathing tube.  Feeling cold or  shivery.  Feeling tired, washed out, or achy.  Sleepiness or drowsiness.  Confusion or agitation.  What happens before the procedure? Staying hydrated Follow instructions from your health care provider about hydration, which may include:  Up to 2 hours before the procedure - you may continue to drink clear liquids, such as water, clear fruit juice, black coffee, and plain tea.  Eating and drinking restrictions Follow instructions from your health care provider about eating and drinking, which may include:  8 hours before the procedure - stop eating heavy meals or foods such as meat, fried foods, or fatty foods.  6 hours before the procedure - stop eating light meals or foods, such as toast or cereal.  6 hours before the procedure - stop drinking milk or drinks that contain milk.  2 hours before the procedure - stop drinking clear liquids.  Medicines  Ask your health care provider about: ? Changing or stopping your regular medicines. This is especially important if you are taking diabetes medicines or blood thinners. ? Taking medicines such as aspirin and ibuprofen. These medicines can thin your blood. Do not take these medicines before your procedure if your health care provider instructs you not to. ? Taking new dietary supplements or medicines. Do not take these during the week before your procedure unless your health care provider approves them.  If you are told to take a medicine or to continue taking a medicine on the day of the procedure, take the medicine with sips of water. General instructions   Ask if you will be going home the same day, the following day, or after a longer hospital stay. ? Plan to have someone take you home. ? Plan to have someone stay with you for the first 24 hours after you leave the hospital or clinic.  For 3-6 weeks before the procedure, try not to use any tobacco products, such as cigarettes, chewing tobacco, and e-cigarettes.  You may brush  your teeth on the morning of the procedure, but make sure to spit out the toothpaste. What happens during the procedure?  You will be given anesthetics through a mask and through an IV tube in one of your veins.  You may receive medicine to help you relax (sedative).  As soon as you are asleep, a breathing tube may be used to help you breathe.  An anesthesia specialist will stay with you throughout the procedure. He or she will help keep you comfortable and safe by continuing to give you medicines and adjusting the amount of medicine that you get. He or she will also watch your blood pressure, pulse, and oxygen levels to make sure that the anesthetics do not cause any problems.  If a breathing tube was used to help you breathe, it will be removed before you wake up. The procedure may vary among health care providers and hospitals. What happens after the procedure?  You will wake up, often slowly, after the procedure is complete, usually in a recovery area.  Your blood pressure, heart rate, breathing rate,  and blood oxygen level will be monitored until the medicines you were given have worn off.  You may be given medicine to help you calm down if you feel anxious or agitated.  If you will be going home the same day, your health care provider may check to make sure you can stand, drink, and urinate.  Your health care providers will treat your pain and side effects before you go home.  Do not drive for 24 hours if you received a sedative.  You may: ? Feel nauseous and vomit. ? Have a sore throat. ? Have mental slowness. ? Feel cold or shivery. ? Feel sleepy. ? Feel tired. ? Feel sore or achy, even in parts of your body where you did not have surgery. This information is not intended to replace advice given to you by your health care provider. Make sure you discuss any questions you have with your health care provider. Document Released: 12/19/2007 Document Revised: 02/22/2016  Document Reviewed: 08/26/2015 Elsevier Interactive Patient Education  2018 [employer]. General Anesthesia, Adult, Care After These instructions provide you with information about caring for yourself after your procedure. Your health care provider may also give you more specific instructions. Your treatment has been planned according to current medical practices, but problems sometimes occur. Call your health care provider if you have any problems or questions after your procedure. What can I expect after the procedure? After the procedure, it is common to have:  Vomiting.  A sore throat.  Mental slowness.  It is common to feel:  Nauseous.  Cold or shivery.  Sleepy.  Tired.  Sore or achy, even in parts of your body where you did not have surgery.  Follow these instructions at home: For at least 24 hours after the procedure:  Do not: ? Participate in activities where you could fall or become injured. ? Drive. ? Use heavy machinery. ? Drink alcohol. ? Take sleeping pills or medicines that cause drowsiness. ? Make important decisions or sign legal documents. ? Take care of children on your own.  Rest. Eating and drinking  If you vomit, drink water, juice, or soup when you can drink without vomiting.  Drink enough fluid to keep your urine clear or pale yellow.  Make sure you have little or no nausea before eating solid foods.  Follow the diet recommended by your health care provider. General instructions  Have a responsible adult stay with you until you are awake and alert.  Return to your normal activities as told by your health care provider. Ask your health care provider what activities are safe for you.  Take over-the-counter and prescription medicines only as told by your health care provider.  If you smoke, do not smoke without supervision.  Keep all follow-up visits as told by your health care provider. This is important. Contact a health care provider  if:  You continue to have nausea or vomiting at home, and medicines are not helpful.  You cannot drink fluids or start eating again.  You cannot urinate after 8-12 hours.  You develop a skin rash.  You have fever.  You have increasing redness at the site of your procedure. Get help right away if:  You have difficulty breathing.  You have chest pain.  You have unexpected bleeding.  You feel that you are having a life-threatening or urgent problem. This information is not intended to replace advice given to you by your health care provider. Make sure you discuss any questions  you have with your health care provider. Document Released: 12/18/2000 Document Revised: 02/14/2016 Document Reviewed: 08/26/2015 Elsevier Interactive Patient Education  [employer].

## 2017-09-25 ENCOUNTER — Other Ambulatory Visit: Payer: Self-pay | Admitting: Obstetrics and Gynecology

## 2017-09-27 ENCOUNTER — Other Ambulatory Visit: Payer: Self-pay | Admitting: Obstetrics and Gynecology

## 2017-09-27 ENCOUNTER — Encounter (HOSPITAL_COMMUNITY)
Admission: RE | Admit: 2017-09-27 | Discharge: 2017-09-27 | Disposition: A | Discharge status: Home / Self Care | Payer: Medicaid Other | Source: Ambulatory Visit | Attending: Obstetrics and Gynecology | Admitting: Obstetrics and Gynecology

## 2017-09-27 ENCOUNTER — Other Ambulatory Visit: Payer: Self-pay

## 2017-09-27 ENCOUNTER — Encounter (HOSPITAL_COMMUNITY): Payer: Self-pay

## 2017-09-27 ENCOUNTER — Encounter: Payer: Self-pay | Admitting: Gastroenterology

## 2017-09-27 ENCOUNTER — Telehealth: Payer: Self-pay | Admitting: Gastroenterology

## 2017-09-27 ENCOUNTER — Ambulatory Visit: Payer: Medicaid Other | Admitting: Gastroenterology

## 2017-09-27 VITALS — BP 145/96 | HR 88 | Temp 99.8°F | Ht 63.0 in | Wt 175.8 lb

## 2017-09-27 DIAGNOSIS — Z8249 Family history of ischemic heart disease and other diseases of the circulatory system: Secondary | ICD-10-CM | POA: Insufficient documentation

## 2017-09-27 DIAGNOSIS — Z01812 Encounter for preprocedural laboratory examination: Secondary | ICD-10-CM | POA: Insufficient documentation

## 2017-09-27 DIAGNOSIS — Z882 Allergy status to sulfonamides status: Secondary | ICD-10-CM | POA: Insufficient documentation

## 2017-09-27 DIAGNOSIS — K625 Hemorrhage of anus and rectum: Secondary | ICD-10-CM | POA: Diagnosis not present

## 2017-09-27 DIAGNOSIS — Z820 Family history of epilepsy and other diseases of the nervous system: Secondary | ICD-10-CM | POA: Insufficient documentation

## 2017-09-27 DIAGNOSIS — R110 Nausea: Secondary | ICD-10-CM | POA: Diagnosis not present

## 2017-09-27 DIAGNOSIS — Z791 Long term (current) use of non-steroidal anti-inflammatories (NSAID): Secondary | ICD-10-CM | POA: Insufficient documentation

## 2017-09-27 DIAGNOSIS — Z885 Allergy status to narcotic agent status: Secondary | ICD-10-CM | POA: Diagnosis not present

## 2017-09-27 DIAGNOSIS — Z01810 Encounter for preprocedural cardiovascular examination: Secondary | ICD-10-CM | POA: Insufficient documentation

## 2017-09-27 DIAGNOSIS — Z808 Family history of malignant neoplasm of other organs or systems: Secondary | ICD-10-CM | POA: Insufficient documentation

## 2017-09-27 DIAGNOSIS — F329 Major depressive disorder, single episode, unspecified: Secondary | ICD-10-CM | POA: Diagnosis not present

## 2017-09-27 DIAGNOSIS — R1310 Dysphagia, unspecified: Secondary | ICD-10-CM | POA: Diagnosis not present

## 2017-09-27 DIAGNOSIS — R197 Diarrhea, unspecified: Secondary | ICD-10-CM | POA: Diagnosis not present

## 2017-09-27 DIAGNOSIS — Z825 Family history of asthma and other chronic lower respiratory diseases: Secondary | ICD-10-CM | POA: Insufficient documentation

## 2017-09-27 DIAGNOSIS — Z818 Family history of other mental and behavioral disorders: Secondary | ICD-10-CM | POA: Diagnosis not present

## 2017-09-27 DIAGNOSIS — Z8261 Family history of arthritis: Secondary | ICD-10-CM | POA: Diagnosis not present

## 2017-09-27 DIAGNOSIS — F988 Other specified behavioral and emotional disorders with onset usually occurring in childhood and adolescence: Secondary | ICD-10-CM | POA: Insufficient documentation

## 2017-09-27 DIAGNOSIS — M1990 Unspecified osteoarthritis, unspecified site: Secondary | ICD-10-CM | POA: Diagnosis not present

## 2017-09-27 DIAGNOSIS — R109 Unspecified abdominal pain: Secondary | ICD-10-CM | POA: Insufficient documentation

## 2017-09-27 DIAGNOSIS — J45909 Unspecified asthma, uncomplicated: Secondary | ICD-10-CM | POA: Insufficient documentation

## 2017-09-27 DIAGNOSIS — K219 Gastro-esophageal reflux disease without esophagitis: Secondary | ICD-10-CM | POA: Diagnosis not present

## 2017-09-27 DIAGNOSIS — Z87442 Personal history of urinary calculi: Secondary | ICD-10-CM | POA: Insufficient documentation

## 2017-09-27 DIAGNOSIS — Z8051 Family history of malignant neoplasm of kidney: Secondary | ICD-10-CM | POA: Diagnosis not present

## 2017-09-27 DIAGNOSIS — Z79899 Other long term (current) drug therapy: Secondary | ICD-10-CM | POA: Insufficient documentation

## 2017-09-27 DIAGNOSIS — Z9889 Other specified postprocedural states: Secondary | ICD-10-CM | POA: Diagnosis not present

## 2017-09-27 HISTORY — DX: Personal history of urinary calculi: Z87.442

## 2017-09-27 HISTORY — DX: Unspecified asthma, uncomplicated: J45.909

## 2017-09-27 LAB — TYPE AND SCREEN
ABO/RH(D): A POS
Antibody Screen: NEGATIVE

## 2017-09-27 LAB — URINALYSIS, ROUTINE W REFLEX MICROSCOPIC
Bilirubin Urine: NEGATIVE
GLUCOSE, UA: NEGATIVE mg/dL
Hgb urine dipstick: NEGATIVE
Ketones, ur: NEGATIVE mg/dL
Leukocytes, UA: NEGATIVE
Nitrite: NEGATIVE
PH: 5 (ref 5.0–8.0)
PROTEIN: NEGATIVE mg/dL
Specific Gravity, Urine: 1.027 (ref 1.005–1.030)

## 2017-09-27 LAB — COMPREHENSIVE METABOLIC PANEL
ALBUMIN: 4 g/dL (ref 3.5–5.0)
ALK PHOS: 73 U/L (ref 38–126)
ALT: 20 U/L (ref 14–54)
AST: 19 U/L (ref 15–41)
Anion gap: 8 (ref 5–15)
BILIRUBIN TOTAL: 1.3 mg/dL — AB (ref 0.3–1.2)
BUN: 16 mg/dL (ref 6–20)
CALCIUM: 9.1 mg/dL (ref 8.9–10.3)
CO2: 26 mmol/L (ref 22–32)
CREATININE: 0.93 mg/dL (ref 0.44–1.00)
Chloride: 103 mmol/L (ref 101–111)
GFR calc Af Amer: >60 mL/min (ref >60)
GFR calc non Af Amer: >60 mL/min (ref >60)
GLUCOSE: 102 mg/dL — AB (ref 65–99)
Potassium: 3.7 mmol/L (ref 3.5–5.1)
Sodium: 137 mmol/L (ref 135–145)
TOTAL PROTEIN: 7.2 g/dL (ref 6.5–8.1)

## 2017-09-27 LAB — CBC
HEMATOCRIT: 41.9 % (ref 36.0–46.0)
HEMOGLOBIN: 13.8 g/dL (ref 12.0–15.0)
MCH: 30 pg (ref 26.0–34.0)
MCHC: 32.9 g/dL (ref 30.0–36.0)
MCV: 91.1 fL (ref 78.0–100.0)
Platelets: 327 10*3/uL (ref 150–400)
RBC: 4.6 MIL/uL (ref 3.87–5.11)
RDW: 12.3 % (ref 11.5–15.5)
WBC: 10.9 10*3/uL — AB (ref 4.0–10.5)

## 2017-09-27 LAB — HCG, SERUM, QUALITATIVE: Preg, Serum: NEGATIVE

## 2017-09-27 MED ORDER — PANTOPRAZOLE SODIUM 40 MG PO TBEC: 40.0000 mg | DELAYED_RELEASE_TABLET | Freq: Two times a day (BID) | ORAL | 3 refills | Status: DC

## 2017-09-27 NOTE — Telephone Encounter (Signed)
Patient contacted. Scheduled her esophageal manometry 10/08/17 arrive at 10:00 am.

## 2017-09-27 NOTE — Patient Instructions (Signed)
We have ordered a manometry for you.  I will see you in March 2019!

## 2017-09-27 NOTE — Progress Notes (Signed)
Referring Provider: Selinda FlavinHoward, Kevin, MD Primary Care Physician:  Selinda FlavinHoward, Kevin, MD Primary GI: Dr. Darrick PennaFields   Chief Complaint  Patient presents with  . Dysphagia  . Nausea    occ after eating    HPI:   Ruth Shah is a 42 y.o. female presenting today with a history of GERD, dysphagia, abdominal pain, diarrhea. Recent EGD with one benign-appearing, intrinsic stenosis that was widely patent and traversed. Small hiatal hernia, few small sessile polyps. Chronic gastritis. Normal duodenum, negative celiac sprue. US March 2018 at [facility] with normal gallbladder. HIDA scan normal. Recommendations to consider tapering off cymbalta and try Paxil. Noted scant blood once or twice in remote past. Discussed colonoscopy at last visit. Prescribed course of Xifaxan for 2 weeks for IBS and possibility of SBBO. Much improved with diarrhea. BPE with small partial Schatzki's ring without tablet obstruction. Esophageal dysmotility noted.   Diarrhea remains much improved. Only depends on what she eats. SEC biscuit this morning tore her stomach up. Still with dysphagia. Sometimes forgets to take PPI. Undergoing hysterectomy and rectocele repair 10/02/17. Wants to pursue colonoscopy once things settle down (recommended due to rectal bleeding).   Past Medical History:  Diagnosis Date  . ADD (attention deficit disorder)   . Arthritis   . Asthma    mild seasonal reactions to mold and mildew  . Depression   . Fibromyalgia   . GERD (gastroesophageal reflux disease)   . Herpes simplex disease   . History of kidney stones   . Migraines   . Paresthesia of both hands   . PONV (postoperative nausea and vomiting)     Past Surgical History:  Procedure Laterality Date  . ENDOMETRIAL ABLATION    . ESOPHAGOGASTRODUODENOSCOPY (EGD) WITH PROPOFOL N/A 12/26/2016   Dr. Darrick PennaFields: one benign-appearing, intrinsic stenosis that was widely patent and traversed. Small hiatal hernia, few small sessile polyps. Chronic gastritis.  Normal duodenum, negative celiac sprue.   Marland Kitchen. HERNIA REPAIR     inguinal as a baby   . left ovarian removal due to cyst    . PILONIDAL CYST / SINUS EXCISION  2009  . TUBAL LIGATION      Current Outpatient Medications  Medication Sig Dispense Refill  . ADDERALL XR 20 MG 24 hr capsule Take 20 mg by mouth every morning.  0  . naproxen (NAPROSYN) 500 MG tablet Take 500 mg by mouth daily as needed for moderate pain.     Marland Kitchen. sertraline (ZOLOFT) 50 MG tablet Take 50 mg by mouth daily.    . pantoprazole (PROTONIX) 40 MG tablet Take 1 tablet (40 mg total) by mouth 2 (two) times daily before a meal. 180 tablet 3   No current facility-administered medications for this visit.     Allergies as of 09/27/2017 - Review Complete 09/27/2017  Allergen Reaction Noted  . Sulfa antibiotics Nausea Only 07/20/2016  . Toradol [ketorolac tromethamine] Nausea Only 07/20/2016  . Vicodin [hydrocodone-acetaminophen] Itching 08/12/2015    Family History  Problem Relation Age of Onset  . Cancer Mother        skin  . Depression Mother   . Hypertension Mother   . Hyperlipidemia Mother   . Cancer Father        skin  . Arthritis Father   . Hypertension Father   . Hyperlipidemia Father   . Depression Maternal Grandmother   . Hypertension Maternal Grandmother   . Mental illness Maternal Grandmother   . Cancer Maternal Grandfather  kidney  . Hypertension Maternal Grandfather   . Kidney disease Maternal Grandfather   . Emphysema Paternal Grandmother   . Alzheimer's disease Paternal Grandmother   . Parkinson's disease Paternal Grandfather   . Colon cancer Neg Hx   . Colon polyps Neg Hx     Social History   Socioeconomic History  . Marital status: Married    Spouse name: None  . Number of children: 4  . Years of education: Some College  . Highest education level: None  Social Needs  . Financial resource strain: None  . Food insecurity - worry: None  . Food insecurity - inability: None  .  Transportation needs - medical: None  . Transportation needs - non-medical: None  Occupational History  . None  Tobacco Use  . Smoking status: Never Smoker  . Smokeless tobacco: Never Used  Substance and Sexual Activity  . Alcohol use: No    Alcohol/week: 0.0 oz  . Drug use: No  . Sexual activity: Yes    Birth control/protection: Surgical    Comment: tubal  Other Topics Concern  . None  Social History Narrative   Lives at home with husband and children.   Right-handed.   4 cups caffeine per week.    Review of Systems: Gen: Denies fever, chills, anorexia. Denies fatigue, weakness, weight loss.  CV: Denies chest pain, palpitations, syncope, peripheral edema, and claudication. Resp: Denies dyspnea at rest, cough, wheezing, coughing up blood, and pleurisy. GI: see HPI  Derm: Denies rash, itching, dry skin Psych: Denies depression, anxiety, memory loss, confusion. No homicidal or suicidal ideation.  Heme: Denies bruising, bleeding, and enlarged lymph nodes.  Physical Exam: BP (!) 145/96   Pulse 88   Temp 99.8 F (37.7 C) (Oral)   Ht 5\' 3"  (1.6 m)   Wt 175 lb 12.8 oz (79.7 kg)   BMI 31.14 kg/m  General:   Alert and oriented. No distress noted. Pleasant and cooperative.  Head:  Normocephalic and atraumatic. Eyes:  Conjuctiva clear without scleral icterus. Mouth:  Oral mucosa pink and moist. Good dentition. No lesions. Abdomen:  +BS, soft, non-tender and non-distended. No rebound or guarding. No HSM or masses noted. Msk:  Symmetrical without gross deformities. Normal posture. Extremities:  Without edema. Neurologic:  Alert and  oriented x4 Psych:  Alert and cooperative. Normal mood and affect.

## 2017-09-27 NOTE — Telephone Encounter (Signed)
Beth,  Referral in [place].  [facility] is requesting patient have a manometry done.  Thanks!

## 2017-09-28 ENCOUNTER — Ambulatory Visit: Payer: Medicaid Other | Admitting: Gastroenterology

## 2017-10-01 NOTE — Assessment & Plan Note (Signed)
42 year old female with history of GERD, dysphagia, with EGD April 2018 with widely patent benign-appearing intrinsic stenosis, chronic gastritis. BPE with episode of esophageal dysmotility with note of secondary and tertiary waves and prolonged thoracic esophageal retention of contrast despite upright positioning. She is still experiencing dysphagia. I have asked her to increase PPI to BID, continue to take it as prescribed, and we will proceed with manometry. Return in March 2019.

## 2017-10-01 NOTE — Assessment & Plan Note (Signed)
History of rectal bleeding, none currently. Undergoing hysterectomy and rectocele repair in near future. Declining colonoscopy currently but willing to pursue in future.

## 2017-10-02 ENCOUNTER — Ambulatory Visit (HOSPITAL_COMMUNITY): Payer: Medicaid Other | Admitting: Anesthesiology

## 2017-10-02 ENCOUNTER — Other Ambulatory Visit: Payer: Self-pay

## 2017-10-02 ENCOUNTER — Observation Stay (HOSPITAL_COMMUNITY)
Admission: RE | Admit: 2017-10-02 | Discharge: 2017-10-03 | Disposition: A | Discharge status: Home / Self Care | Payer: Medicaid Other | Source: Ambulatory Visit | Attending: Obstetrics and Gynecology | Admitting: Obstetrics and Gynecology

## 2017-10-02 ENCOUNTER — Encounter (HOSPITAL_COMMUNITY): Payer: Self-pay | Admitting: *Deleted

## 2017-10-02 ENCOUNTER — Encounter (HOSPITAL_COMMUNITY)
Admission: RE | Disposition: A | Discharge status: Home / Self Care | Payer: Self-pay | Source: Ambulatory Visit | Attending: Obstetrics and Gynecology

## 2017-10-02 DIAGNOSIS — Z885 Allergy status to narcotic agent status: Secondary | ICD-10-CM | POA: Insufficient documentation

## 2017-10-02 DIAGNOSIS — Z8261 Family history of arthritis: Secondary | ICD-10-CM | POA: Insufficient documentation

## 2017-10-02 DIAGNOSIS — Z8051 Family history of malignant neoplasm of kidney: Secondary | ICD-10-CM | POA: Diagnosis not present

## 2017-10-02 DIAGNOSIS — M797 Fibromyalgia: Secondary | ICD-10-CM | POA: Diagnosis not present

## 2017-10-02 DIAGNOSIS — M549 Dorsalgia, unspecified: Secondary | ICD-10-CM | POA: Insufficient documentation

## 2017-10-02 DIAGNOSIS — Z882 Allergy status to sulfonamides status: Secondary | ICD-10-CM | POA: Insufficient documentation

## 2017-10-02 DIAGNOSIS — Z90710 Acquired absence of both cervix and uterus: Secondary | ICD-10-CM | POA: Diagnosis present

## 2017-10-02 DIAGNOSIS — R102 Pelvic and perineal pain: Secondary | ICD-10-CM | POA: Insufficient documentation

## 2017-10-02 DIAGNOSIS — Z836 Family history of other diseases of the respiratory system: Secondary | ICD-10-CM | POA: Insufficient documentation

## 2017-10-02 DIAGNOSIS — G8929 Other chronic pain: Secondary | ICD-10-CM | POA: Insufficient documentation

## 2017-10-02 DIAGNOSIS — Z820 Family history of epilepsy and other diseases of the nervous system: Secondary | ICD-10-CM | POA: Insufficient documentation

## 2017-10-02 DIAGNOSIS — Z818 Family history of other mental and behavioral disorders: Secondary | ICD-10-CM | POA: Diagnosis not present

## 2017-10-02 DIAGNOSIS — Z808 Family history of malignant neoplasm of other organs or systems: Secondary | ICD-10-CM | POA: Diagnosis not present

## 2017-10-02 DIAGNOSIS — N854 Malposition of uterus: Secondary | ICD-10-CM | POA: Insufficient documentation

## 2017-10-02 DIAGNOSIS — N816 Rectocele: Principal | ICD-10-CM | POA: Insufficient documentation

## 2017-10-02 DIAGNOSIS — Z791 Long term (current) use of non-steroidal anti-inflammatories (NSAID): Secondary | ICD-10-CM | POA: Insufficient documentation

## 2017-10-02 DIAGNOSIS — F329 Major depressive disorder, single episode, unspecified: Secondary | ICD-10-CM | POA: Insufficient documentation

## 2017-10-02 DIAGNOSIS — K219 Gastro-esophageal reflux disease without esophagitis: Secondary | ICD-10-CM | POA: Diagnosis not present

## 2017-10-02 DIAGNOSIS — Z8349 Family history of other endocrine, nutritional and metabolic diseases: Secondary | ICD-10-CM | POA: Insufficient documentation

## 2017-10-02 DIAGNOSIS — Z8249 Family history of ischemic heart disease and other diseases of the circulatory system: Secondary | ICD-10-CM | POA: Insufficient documentation

## 2017-10-02 DIAGNOSIS — K449 Diaphragmatic hernia without obstruction or gangrene: Secondary | ICD-10-CM | POA: Diagnosis not present

## 2017-10-02 DIAGNOSIS — N856 Intrauterine synechiae: Secondary | ICD-10-CM | POA: Insufficient documentation

## 2017-10-02 DIAGNOSIS — N857 Hematometra: Secondary | ICD-10-CM | POA: Diagnosis not present

## 2017-10-02 DIAGNOSIS — Z79899 Other long term (current) drug therapy: Secondary | ICD-10-CM | POA: Diagnosis not present

## 2017-10-02 HISTORY — PX: RECTOCELE REPAIR: SHX761

## 2017-10-02 HISTORY — PX: VAGINAL HYSTERECTOMY: SHX2639

## 2017-10-02 SURGERY — HYSTERECTOMY, VAGINAL
Anesthesia: General

## 2017-10-02 MED ORDER — SUGAMMADEX SODIUM 200 MG/2ML IV SOLN
INTRAVENOUS | Status: DC | PRN
  Administered 2017-10-02: 158.8 mg via INTRAVENOUS

## 2017-10-02 MED ORDER — ONDANSETRON HCL 4 MG/2ML IJ SOLN
INTRAMUSCULAR | Status: AC
  Filled 2017-10-02: qty 2

## 2017-10-02 MED ORDER — SCOPOLAMINE 1 MG/3DAYS TD PT72
1.0000 | MEDICATED_PATCH | Freq: Once | TRANSDERMAL | Status: DC
  Administered 2017-10-02: 1.5 mg via TRANSDERMAL

## 2017-10-02 MED ORDER — ONDANSETRON HCL 4 MG/2ML IJ SOLN: 4.0000 mg | Freq: Four times a day (QID) | INTRAMUSCULAR | Status: DC | PRN

## 2017-10-02 MED ORDER — MIDAZOLAM HCL 2 MG/2ML IJ SOLN
INTRAMUSCULAR | Status: AC
  Filled 2017-10-02: qty 2

## 2017-10-02 MED ORDER — HYDROMORPHONE 1 MG/ML IV SOLN
INTRAVENOUS | Status: AC
  Filled 2017-10-02: qty 25

## 2017-10-02 MED ORDER — PROMETHAZINE HCL 25 MG/ML IJ SOLN
12.5000 mg | INTRAMUSCULAR | Status: DC | PRN
  Administered 2017-10-02: 12.5 mg via INTRAVENOUS
  Filled 2017-10-02: qty 1

## 2017-10-02 MED ORDER — ONDANSETRON HCL 4 MG/2ML IJ SOLN
4.0000 mg | Freq: Once | INTRAMUSCULAR | Status: AC
  Administered 2017-10-02: 4 mg via INTRAVENOUS

## 2017-10-02 MED ORDER — BUPIVACAINE-EPINEPHRINE (PF) 0.5% -1:200000 IJ SOLN
INTRAMUSCULAR | Status: AC
Start: 2017-10-02 — End: 2017-10-02
  Filled 2017-10-02: qty 30

## 2017-10-02 MED ORDER — ONDANSETRON HCL 4 MG PO TABS: 4.0000 mg | ORAL_TABLET | Freq: Four times a day (QID) | ORAL | Status: DC | PRN

## 2017-10-02 MED ORDER — AMPHETAMINE-DEXTROAMPHET ER 20 MG PO CP24: 20.0000 mg | ORAL_CAPSULE | Freq: Every morning | ORAL | Status: DC

## 2017-10-02 MED ORDER — BUPIVACAINE-EPINEPHRINE 0.5% -1:200000 IJ SOLN
INTRAMUSCULAR | Status: DC | PRN
  Administered 2017-10-02: 15 mL
  Administered 2017-10-02: 10 mL

## 2017-10-02 MED ORDER — EPHEDRINE SULFATE 50 MG/ML IJ SOLN
INTRAMUSCULAR | Status: AC
  Filled 2017-10-02: qty 1

## 2017-10-02 MED ORDER — HYDROMORPHONE HCL 1 MG/ML IJ SOLN
0.2500 mg | INTRAMUSCULAR | Status: DC | PRN
  Administered 2017-10-02: 0.5 mg via INTRAVENOUS
  Filled 2017-10-02: qty 1

## 2017-10-02 MED ORDER — ROCURONIUM BROMIDE 50 MG/5ML IV SOLN
INTRAVENOUS | Status: AC
  Filled 2017-10-02: qty 1

## 2017-10-02 MED ORDER — HYDROMORPHONE 1 MG/ML IV SOLN
INTRAVENOUS | Status: DC
  Administered 2017-10-02: 14:00:00 via INTRAVENOUS

## 2017-10-02 MED ORDER — LIDOCAINE HCL (CARDIAC) 20 MG/ML IV SOLN
INTRAVENOUS | Status: DC | PRN
  Administered 2017-10-02: 40 mg via INTRAVENOUS

## 2017-10-02 MED ORDER — DIPHENHYDRAMINE HCL 50 MG/ML IJ SOLN: 12.5000 mg | Freq: Four times a day (QID) | INTRAMUSCULAR | Status: DC | PRN

## 2017-10-02 MED ORDER — ONDANSETRON HCL 4 MG/2ML IJ SOLN
4.0000 mg | Freq: Four times a day (QID) | INTRAMUSCULAR | Status: DC | PRN
  Administered 2017-10-02: 4 mg via INTRAVENOUS
  Filled 2017-10-02: qty 2

## 2017-10-02 MED ORDER — LACTATED RINGERS IV SOLN
INTRAVENOUS | Status: DC
  Administered 2017-10-02 (×3): via INTRAVENOUS

## 2017-10-02 MED ORDER — DEXAMETHASONE SODIUM PHOSPHATE 4 MG/ML IJ SOLN
4.0000 mg | Freq: Once | INTRAMUSCULAR | Status: AC
  Administered 2017-10-02: 4 mg via INTRAVENOUS

## 2017-10-02 MED ORDER — PROPOFOL 10 MG/ML IV BOLUS
INTRAVENOUS | Status: DC | PRN
  Administered 2017-10-02: 150 mg via INTRAVENOUS
  Administered 2017-10-02: 50 mg via INTRAVENOUS

## 2017-10-02 MED ORDER — EPHEDRINE SULFATE 50 MG/ML IJ SOLN
INTRAMUSCULAR | Status: DC | PRN
  Administered 2017-10-02: 10 mg via INTRAVENOUS

## 2017-10-02 MED ORDER — MIDAZOLAM HCL 2 MG/2ML IJ SOLN
1.0000 mg | INTRAMUSCULAR | Status: DC
  Administered 2017-10-02: 2 mg via INTRAVENOUS

## 2017-10-02 MED ORDER — DIPHENHYDRAMINE HCL 12.5 MG/5ML PO ELIX: 12.5000 mg | ORAL_SOLUTION | Freq: Four times a day (QID) | ORAL | Status: DC | PRN

## 2017-10-02 MED ORDER — SODIUM CHLORIDE 0.9 % IJ SOLN
INTRAMUSCULAR | Status: AC
Start: 2017-10-02 — End: 2017-10-02
  Filled 2017-10-02: qty 10

## 2017-10-02 MED ORDER — SODIUM CHLORIDE 0.9 % IR SOLN
Status: DC | PRN
  Administered 2017-10-02: 3000 mL

## 2017-10-02 MED ORDER — CEFAZOLIN SODIUM-DEXTROSE 2-4 GM/100ML-% IV SOLN
2.0000 g | INTRAVENOUS | Status: AC
  Administered 2017-10-02: 2 g via INTRAVENOUS
  Filled 2017-10-02: qty 100

## 2017-10-02 MED ORDER — FENTANYL CITRATE (PF) 100 MCG/2ML IJ SOLN
INTRAMUSCULAR | Status: AC
  Filled 2017-10-02: qty 2

## 2017-10-02 MED ORDER — FENTANYL CITRATE (PF) 250 MCG/5ML IJ SOLN
INTRAMUSCULAR | Status: AC
  Filled 2017-10-02: qty 5

## 2017-10-02 MED ORDER — SODIUM CHLORIDE 0.9% FLUSH: 9.0000 mL | INTRAVENOUS | Status: DC | PRN

## 2017-10-02 MED ORDER — STERILE WATER FOR IRRIGATION IR SOLN
Status: DC | PRN
  Administered 2017-10-02: 1000 mL

## 2017-10-02 MED ORDER — LIDOCAINE HCL (PF) 1 % IJ SOLN
INTRAMUSCULAR | Status: AC
  Filled 2017-10-02: qty 5

## 2017-10-02 MED ORDER — SERTRALINE HCL 50 MG PO TABS
50.0000 mg | ORAL_TABLET | Freq: Every day | ORAL | Status: DC
  Administered 2017-10-03: 50 mg via ORAL
  Filled 2017-10-02 (×2): qty 1

## 2017-10-02 MED ORDER — SODIUM CHLORIDE 0.9 % IV SOLN
INTRAVENOUS | Status: DC
  Administered 2017-10-02 – 2017-10-03 (×3): via INTRAVENOUS

## 2017-10-02 MED ORDER — PANTOPRAZOLE SODIUM 40 MG PO TBEC: 40.0000 mg | DELAYED_RELEASE_TABLET | Freq: Every day | ORAL | Status: DC

## 2017-10-02 MED ORDER — ONDANSETRON HCL 4 MG/2ML IJ SOLN
INTRAMUSCULAR | Status: DC | PRN
  Administered 2017-10-02: 4 mg via INTRAVENOUS

## 2017-10-02 MED ORDER — PANTOPRAZOLE SODIUM 40 MG PO TBEC
40.0000 mg | DELAYED_RELEASE_TABLET | Freq: Two times a day (BID) | ORAL | Status: DC
  Administered 2017-10-03: 40 mg via ORAL
  Filled 2017-10-02 (×2): qty 1

## 2017-10-02 MED ORDER — NALOXONE HCL 0.4 MG/ML IJ SOLN: 0.4000 mg | INTRAMUSCULAR | Status: DC | PRN

## 2017-10-02 MED ORDER — ROCURONIUM BROMIDE 50 MG/5ML IV SOLN
INTRAVENOUS | Status: AC
  Filled 2017-10-02: qty 2

## 2017-10-02 MED ORDER — 0.9 % SODIUM CHLORIDE (POUR BTL) OPTIME
TOPICAL | Status: DC | PRN
  Administered 2017-10-02: 1000 mL

## 2017-10-02 MED ORDER — DEXAMETHASONE SODIUM PHOSPHATE 4 MG/ML IJ SOLN
INTRAMUSCULAR | Status: AC
  Filled 2017-10-02: qty 1

## 2017-10-02 MED ORDER — SCOPOLAMINE 1 MG/3DAYS TD PT72
MEDICATED_PATCH | TRANSDERMAL | Status: AC
  Filled 2017-10-02: qty 1

## 2017-10-02 MED ORDER — FENTANYL CITRATE (PF) 100 MCG/2ML IJ SOLN
INTRAMUSCULAR | Status: DC | PRN
  Administered 2017-10-02 (×3): 50 ug via INTRAVENOUS
  Administered 2017-10-02: 100 ug via INTRAVENOUS

## 2017-10-02 MED ORDER — ROCURONIUM BROMIDE 100 MG/10ML IV SOLN
INTRAVENOUS | Status: DC | PRN
  Administered 2017-10-02: 10 mg via INTRAVENOUS
  Administered 2017-10-02: 40 mg via INTRAVENOUS

## 2017-10-02 MED ORDER — IBUPROFEN 600 MG PO TABS
600.0000 mg | ORAL_TABLET | Freq: Four times a day (QID) | ORAL | Status: DC | PRN
  Administered 2017-10-02: 600 mg via ORAL
  Filled 2017-10-02: qty 1

## 2017-10-02 SURGICAL SUPPLY — 39 items
BAG HAMPER (MISCELLANEOUS) ×2 IMPLANT
CLOTH BEACON ORANGE TIMEOUT ST (SAFETY) ×2 IMPLANT
COVER LIGHT HANDLE STERIS (MISCELLANEOUS) ×4 IMPLANT
DECANTER SPIKE VIAL GLASS SM (MISCELLANEOUS) ×2 IMPLANT
DRAPE HALF SHEET 40X57 (DRAPES) ×2 IMPLANT
DRAPE PROXIMA HALF (DRAPES) ×2 IMPLANT
DRAPE STERI URO 9X17 APER PCH (DRAPES) ×2 IMPLANT
ELECT REM PT RETURN 9FT ADLT (ELECTROSURGICAL) ×2
ELECTRODE REM PT RTRN 9FT ADLT (ELECTROSURGICAL) ×1 IMPLANT
GAUZE PACKING 2X5 YD STRL (GAUZE/BANDAGES/DRESSINGS) ×2 IMPLANT
GAUZE SPONGE 4X4 12PLY STRL (GAUZE/BANDAGES/DRESSINGS) ×4 IMPLANT
GLOVE BIO SURGEON STRL SZ7 (GLOVE) ×2 IMPLANT
GLOVE BIOGEL PI IND STRL 7.0 (GLOVE) ×3 IMPLANT
GLOVE BIOGEL PI IND STRL 9 (GLOVE) ×1 IMPLANT
GLOVE BIOGEL PI INDICATOR 7.0 (GLOVE) ×3
GLOVE BIOGEL PI INDICATOR 9 (GLOVE) ×1
GLOVE ECLIPSE 6.5 STRL STRAW (GLOVE) ×2 IMPLANT
GLOVE ECLIPSE 9.0 STRL (GLOVE) ×4 IMPLANT
GOWN SPEC L3 XXLG W/TWL (GOWN DISPOSABLE) ×2 IMPLANT
GOWN STRL REUS W/TWL LRG LVL3 (GOWN DISPOSABLE) ×4 IMPLANT
IV NS IRRIG 3000ML ARTHROMATIC (IV SOLUTION) ×2 IMPLANT
KIT ROOM TURNOVER AP CYSTO (KITS) ×2 IMPLANT
LUBRICANT JELLY 4.5OZ STERILE (MISCELLANEOUS) ×2 IMPLANT
MANIFOLD NEPTUNE II (INSTRUMENTS) ×2 IMPLANT
NEEDLE HYPO 25X1 1.5 SAFETY (NEEDLE) ×2 IMPLANT
NS IRRIG 1000ML POUR BTL (IV SOLUTION) ×2 IMPLANT
PACK PERI GYN (CUSTOM PROCEDURE TRAY) ×2 IMPLANT
PAD ARMBOARD 7.5X6 YLW CONV (MISCELLANEOUS) ×2 IMPLANT
SET BASIN LINEN APH (SET/KITS/TRAYS/PACK) ×2 IMPLANT
SUT CHROMIC 0 CT 1 (SUTURE) ×24 IMPLANT
SUT CHROMIC 2 0 CT 1 (SUTURE) ×2 IMPLANT
SUT CHROMIC GUT BROWN 0 54 (SUTURE) IMPLANT
SUT CHROMIC GUT BROWN 0 54IN (SUTURE)
SUT VIC AB 0 CT2 8-18 (SUTURE) ×2 IMPLANT
SYR CONTROL 10ML LL (SYRINGE) ×2 IMPLANT
TRAY FOLEY CATH SILVER 16FR (SET/KITS/TRAYS/PACK) ×2 IMPLANT
TRAY FOLEY W/METER SILVER 16FR (SET/KITS/TRAYS/PACK) ×2 IMPLANT
VERSALIGHT (MISCELLANEOUS) ×2 IMPLANT
WATER STERILE IRR 1000ML POUR (IV SOLUTION) ×2 IMPLANT

## 2017-10-02 NOTE — H&P (Signed)
Expand All Collapse All   Preoperative History and Physical  Ruth Shah is a 42 y.o. G5P0014 here for surgical management of retroverted uterus, with rectocele based on hx.   No significant preoperative concerns. Ultrasound was reviewed.  She is status post endometrial ablation and has a small fluid collection in the endometrial cavity.  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 She had a bowel prep yesterday, with last evacuation early this morning. We have discussed the pro and con of tubal removal, and have agreed to leave the tube in situ, to reduce adhesion tendencies.   Proposed surgery: vaginal hysterectomy, posterior repair      Past Medical History:  Diagnosis Date  . ADD (attention deficit disorder)   . Arthritis   . Depression   . Fibromyalgia   . GERD (gastroesophageal reflux disease)   . Herpes simplex disease   . Migraines   . Paresthesia of both hands   . PONV (postoperative nausea and vomiting)         Past Surgical History:  Procedure Laterality Date  . ENDOMETRIAL ABLATION    . ESOPHAGOGASTRODUODENOSCOPY (EGD) WITH PROPOFOL N/A 12/26/2016   Dr. Fields: one benign-appearing, intrinsic stenosis that was widely patent and traversed. Small hiatal hernia, few small sessile polyps. Chronic gastritis. Normal duodenum, negative celiac sprue.   . HERNIA REPAIR     inguinal as a baby   . left ovarian removal due to cyst    . PILONIDAL CYST / SINUS EXCISION  2009  . TUBAL LIGATION                     OB History  Gravida Para Term Preterm AB Living  5 4     1 4  SAB TAB Ectopic Multiple Live Births  1            # Outcome Date GA Lbr Len/2nd Weight Sex Delivery Anes PTL Lv  5 Para           4 Para           3 Para           2 Para           1 SAB             Patient denies any other pertinent gynecologic issues.         Current Outpatient Medications  on File Prior to Visit  Medication Sig Dispense Refill  . ADDERALL XR 20 MG 24 hr capsule Take 20 mg by mouth every morning.  0  . naproxen (NAPROSYN) 500 MG tablet Take 500 mg by mouth.    . pantoprazole (PROTONIX) 40 MG tablet Take 1 tablet (40 mg total) by mouth daily. Take 30 minutes before breakfast 90 tablet 3  . sertraline (ZOLOFT) 50 MG tablet Take 50 mg by mouth daily.    . gabapentin (NEURONTIN) 100 MG capsule Take one capsule at bedtime x one week, then one capsule twice daily. (Patient not taking: Reported on 08/23/2017) 60 capsule 1  . Na Sulfate-K Sulfate-Mg Sulf (SUPREP BOWEL PREP KIT) 17.5-3.13-1.6 GM/177ML SOLN Take 1 kit by mouth as directed. (Patient not taking: Reported on 08/08/2017) 1 Bottle 0   No current facility-administered medications on file prior to visit.        Allergies  Allergen Reactions  . Sulfa Antibiotics Nausea Only  . Toradol [Ketorolac Tromethamine] Nausea Only  . Vicodin [Hydrocodone-Acetaminophen] Itching      Social History:   reports that  has never smoked. she has never used smokeless tobacco. She reports that she does not drink alcohol or use drugs.       Family History  Problem Relation Age of Onset  . Cancer Mother        skin  . Depression Mother   . Hypertension Mother   . Hyperlipidemia Mother   . Cancer Father        skin  . Arthritis Father   . Hypertension Father   . Hyperlipidemia Father   . Depression Maternal Grandmother   . Hypertension Maternal Grandmother   . Mental illness Maternal Grandmother   . Cancer Maternal Grandfather        kidney  . Hypertension Maternal Grandfather   . Kidney disease Maternal Grandfather   . Emphysema Paternal Grandmother   . Alzheimer's disease Paternal Grandmother   . Parkinson's disease Paternal Grandfather   . Colon cancer Neg Hx   . Colon polyps Neg Hx     Review of Systems: Noncontributory  PHYSICAL EXAM: Blood pressure (!) 152/94, pulse 86,  height 5' 3" (1.6 m), weight 172 lb 9.6oz (78.3 kg). General appearance - alert, well appearing, and in no distress Chest - clear to auscultation, no wheezes, rales or rhonchi, symmetric air entry Heart - normal rate and regular rhythm Abdomen - soft, nontender, nondistended, no masses or organomegaly Pelvic exam:  VULVA: normal appearing vulva with no masses, tenderness or lesions,  VAGINA: normal appearing vagina with normal color and discharge, no lesions,  1st degree uterine descensus CERVIX: normal appearing cervix without discharge or lesions,, a large cervix cervical mucous is healthy looking UTERUS: uterus is normal size, shape, consistency and Mildly tender to palpation in the retroverted position.  ADNEXA: left ovary surgically absent,  Rectal: rectocele above anal sphincter  which is intact  Extremities - peripheral pulses normal, no pedal edema, no clubbing or cyanosis  Labs: CBC    Component Value Date/Time   WBC 10.9 (H) 09/27/2017 1511   RBC 4.60 09/27/2017 1511   HGB 13.8 09/27/2017 1511   HGB 14.0 06/06/2016 1053   HCT 41.9 09/27/2017 1511   HCT 41.4 06/06/2016 1053   PLT 327 09/27/2017 1511   PLT 322 06/06/2016 1053   MCV 91.1 09/27/2017 1511   MCV 90 06/06/2016 1053   MCH 30.0 09/27/2017 1511   MCHC 32.9 09/27/2017 1511   RDW 12.3 09/27/2017 1511   RDW 13.1 06/06/2016 1053   LYMPHSABS 1.6 07/23/2008 2011   MONOABS 0.6 07/23/2008 2011   EOSABS 0.2 07/23/2008 2011   BASOSABS 0.1 07/23/2008 2011     CMP Latest Ref Rng & Units 09/27/2017 12/25/2016 06/06/2016  Glucose 65 - 99 mg/dL 102(H) 80 80  BUN 6 - 20 mg/dL _0 Creatinine 0.44 - 1.00 mg/dL 0.93 0.80 0.79  Sodium 135 - 145 mmol/L 137 140 141  Potassium 3.5 - 5.1 mmol/L 3.7 3.6 4.5  Chloride 101 - 111 mmol/L 103 106 101  CO2 22 - 32 mmol/L _1 Calcium 8.9 - 10.3 mg/dL 9.1 9.1 9.4  Total Protein 6.5 - 8.1 g/dL 7.2 - 6.9  Total Bilirubin 0.3 - 1.2 mg/dL 1.3(H) - 0.9  Alkaline Phos 38 - 126  U/L 73 - 82  AST 15 - 41 U/L 19 - 14  ALT 14 - 54 U/L 20 - 16    Imaging Studies:  Imaging Results  US Transvaginal Non-ob  Result Date: 08/14/2017  GYNECOLOGIC SONOGRAM Sutton W Hafley is a 41 y.o. G5P0014 S/P ablation,she is here  for a pelvic sonogram for dyspareunia. Uterus                      9.7 x 4.8 x 5.9 cm,  homogeneous retroverted uterus,two communicating cysts w/in the fundus of the uterus( #1) right 2 x 1.3 x 1.7 cm (#2) left 2.2 x.7 x  1.2 cm Endometrium          6.4 mm, symmetrical, wnl Right ovary             3.1 x 3.2 x 2.8 cm,  hemorrhagic cyst 2.1 x 2 x 2.4 cm Left adnexa             WNL No free fluid Technician Comments: PELVIC US TA/TV: homogeneous retroverted uterus,EEC 6.4 mm,two communicating cysts w/in the fundus of the uterus( #1) right 2 x 1.3 x 1.7 cm (#2) left 2.2 x.7 x  1.2 cm,left oophorectomy,left adnexa wnl,right hemorrhagic cyst 2.1 x 2 x 2.4 cm,mult simple nabothian cysts,no free fluid,no pain during ultrasound Amber J Carl 08/14/2017 3:52 PM Clinical Impression and recommendations: I have reviewed the sonogram results above.pelvic pain and dyspareunia.patient is status post endometrial ablation Combined with the patient's current clinical course, below are my impressions and any appropriate recommendations for management based on the sonographic findings: 1.the patient's uterus is relatively small size, retroverted which would lead to contact of the uterine Fundusbody during intimacy. 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 This can make the uterus more tender. Would reevaluate patient and if clinical exam indicates tenderness with uterine fundus contact on digital bimanual exam. Patient may wish to consider surgical options if uterine but  fundus tender on contact of bimanual exam John V Ferguson   Us Pelvis Complete  Result Date: 08/14/2017 GYNECOLOGIC SONOGRAM Kerri W Dorantes is a 41 y.o. G5P0014 S/P ablation,she is here  for a pelvic sonogram for dyspareunia. Uterus                      9.7 x 4.8 x 5.9 cm,  homogeneous retroverted uterus,two communicating cysts w/in the fundus of the uterus( #1) right 2 x 1.3 x 1.7 cm (#2) left 2.2 x.7 x  1.2 cm Endometrium          6.4 mm, symmetrical, wnl Right ovary             3.1 x 3.2 x 2.8 cm,  hemorrhagic cyst 2.1 x 2 x 2.4 cm Left adnexa             WNL No free fluid Technician Comments: PELVIC US TA/TV: homogeneous retroverted uterus,EEC 6.4 mm,two communicating cysts w/in the fundus of the uterus( #1) right 2 x 1.3 x 1.7 cm (#2) left 2.2 x.7 x  1.2 cm,left oophorectomy,left adnexa wnl,right hemorrhagic cyst 2.1 x 2 x 2.4 cm,mult simple nabothian cysts,no free fluid,no pain during ultrasound Amber J Carl 08/14/2017 3:52 PM Clinical Impression and recommendations: I have reviewed the sonogram results above.pelvic pain and dyspareunia.patient is status post endometrial ablation Combined with the patient's current clinical course, below are my impressions and any appropriate recommendations for management based on the sonographic findings: 1.the patient's uterus is relatively small size, retroverted which   would lead to contact of the uterine Fundusbody during intimacy. 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 This can make the uterus more tender. Would reevaluate patient and if clinical exam indicates tenderness with uterine fundus contact on digital bimanual exam. Patient may wish to consider surgical options if uterine but fundus tender on contact of bimanual exam John V  Ferguson   Dg Esophagus  Result Date: 07/31/2017 CLINICAL DATA:  Solid food dysphagia, food getting stuck in throat for 1 year, history of prior throat infection and GERD EXAM: ESOPHOGRAM / BARIUM SWALLOW / BARIUM TABLET STUDY TECHNIQUE: Combined double contrast and single contrast examination performed using effervescent crystals, thick barium liquid, and thin barium liquid. The patient was observed with fluoroscopy swallowing a 13 mm barium sulphate tablet. FLUOROSCOPY TIME:  Fluoroscopy Time:  2 minutes 6 seconds Radiation Exposure Index (if provided by the fluoroscopic device): 30.9 mGy Number of Acquired Spot Images: multiple fluoroscopic screen captures COMPARISON:  None FINDINGS: Esophageal distention: Normal distention without mass or stricture Filling defects: Small partial Schatzki ring identified at distal esophagus 12.5 mm barium tablet: Passed from oral cavity to stomach without obstruction Motility: Episode dysmotility during the exam with incomplete clearance of barium by primary peristaltic waves and note of multiple secondary and tertiary waves Mucosa:  Smooth without irregularity or ulceration Hypopharynx/cervical esophagus: No laryngeal penetration or aspiration. No residuals. Hiatal hernia:  Tiny sliding hiatal hernia GE reflux:  Not witnessed during exam Other:  N/A IMPRESSION: Tiny sliding hiatal hernia with visualization of a small partial Schatzki ring which did not obstruct the 12.5 mm diameter barium tablet. Episode of esophageal dysmotility with note of secondary and tertiary waves and prolonged thoracic esophageal retention of contrast despite upright positioning. Electronically Signed   By: Mark  Boles M.D.   On: 07/31/2017 11:12     Assessment:     Patient Active Problem List   Diagnosis Date Noted  . Rectal bleeding 07/26/2017  . Rectocele 06/28/2017  . Uterine prolapse 06/28/2017  . Diarrhea 04/26/2017  . Chronic bilateral low back pain without sciatica 02/15/2017   . Dyspepsia 12/21/2016  . Dysphagia 12/21/2016  . Nausea without vomiting 12/21/2016  . Chronic migraine 07/20/2016  . Neck pain 07/20/2016  . 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 H/O Genital warts 08/12/2015    Plan: Patient will undergo surgical management with vaginal hysterectomy, posterior repair, schedule for Jan 8th.,     John V Ferguson, MD         

## 2017-10-02 NOTE — Brief Op Note (Signed)
10/02/2017  12:41 PM  PATIENT:  Ruth Shah  42 y.o. 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), rectocele  POST-OPERATIVE DIAGNOSIS:  Pelvic pain, Ashermans syndrome after endometrial ablation(With Hematometra), rectocele  PROCEDURE:  Procedure(s): HYSTERECTOMY VAGINAL (N/A) POSTERIOR REPAIR (RECTOCELE) (N/A)  SURGEON:  Surgeon(s) and Role:    * Tilda Burrow, MD - Primary  PHYSICIAN ASSISTANT:   ASSISTANTS: Barbaraann Rondo RNFA  ANESTHESIA:   local and general  EBL:  200 mL   BLOOD ADMINISTERED:none  DRAINS: Urinary Catheter (Foley) and Betadine soaked vaginal packing   LOCAL MEDICATIONS USED:  MARCAINE    and Amount: 25 ml  SPECIMEN:  Source of Specimen:  Uterus and cervix, vaginal epithelium  DISPOSITION OF SPECIMEN:  PATHOLOGY  COUNTS:  YES  TOURNIQUET:  * No tourniquets in log *  DICTATION: .Dragon Dictation  PLAN OF CARE: Admit for overnight observation  PATIENT DISPOSITION:  PACU - hemodynamically stable.   Delay start of Pharmacological VTE agent (>24hrs) due to surgical blood loss or risk of bleeding: yes Details of procedure: Patient was taken the operating room prepped and draped for vaginal procedure with legs in  lithotomy position, candycane stirrups, with timeout conducted and procedure confirmed by operative team.  Ancef 2 g was administered.  A short weighted speculum was inserted in the vagina and the cervix grasped with Lahey thyroid tenaculum, traction allowed the cervix to come down to within about 3 cm of the introitus.  The cervix was circumscribed from 8:00 around to 4:00 with Bovie cautery under local anesthesia and the anterior vesicouterine avascular space identified anteriorly dissected upward and the anterior vesicouterine peritoneal reflection opened difficulty.  Posterior colpotomy incision was made into the cul-de-sac without difficulty.  Uterosacral ligaments on  either side were clamped cut and suture-ligated and tagged on either side with a straight hemostat placed on 0 chromic suture lower cardinal ligaments were clamped cut and suture-ligated on each side.  Upper cardinal ligaments were taken down as well being careful to incorporate anterior and posterior peritoneum in the clampings with small bites taken as we marched up the sides of the uterus on each side.  Upon reaching the midportion of the broad ligament, the cervix and lower uterine segment of the uterus.  The uterus could then be rotated sufficiently that the right round ligament and fallopian tube could be grasped clamped cut and suture-ligated using Zeppelin clamp, Mayo scissors the utero-ovarian ligament on the right was cut and suture-ligated in the separate pedicle.  Pedicles upper pedicles the stool on the right side were then tagged and held for future inspection of hemostasis.  On the left side the ovary was absent and there was some fibrosis or ovarian remnant in the area.  The fallopian tube on the left side was present the pedicles were clamped cut and suture-ligated on the left side and right side with good hemostasis.  Pedicles were inspected and confirmed as hemostatic.  The using running locking 2-0 chromic with good tissue edge approximation.  The remainder of the cuff was closed with a series of interrupted figure of 8 sutures of 0 chromic pulling the bladder support laterally and attachment to the posterior cuff including the uterosacral ligaments on either side.  Remainder of the cuff was closed with interrupted sutures and hemostasis was good. Posterior repair.  With a new set of gloves the digital rectal exam was repeated identifying the relaxed perineal tissues and the significant rectocele which bulges  forward into the vagina greater than 90 degrees.  The midline of the posterior perineal body was then infiltrated with Marcaine solution split in the midline for a distance of approximately  4 cm of the vagina, with the vaginal epithelium dissected off the underlying connective tissue.  After the digital rectal exam, the surgeon's gloves had been changed again.  There was a vaginal be been placed to separate the rectum from the surgical area once the vaginal epithelium had been dissected up on either side and lateral tissues and exposed, again a double gloved index finger was placed in the vagina and using it to delineate rectal margins, Allis clamps were used to grasp the perirectal tissues that had been previously torn.  It was identified that the previous perineal body defect was on the left side of the midline and the extra redundant tissue on the patient's right side could be mobilized lifted upward and across the midline and attached on the patient's left side further cephalad sufficiently to give a improved perineal body support and improved rectovaginal tone fingers could still be placed in the vagina without difficulty.  The vaginal epithelium was trimmed and reapproximated using interrupted 2-0 chromic.  Vaginal packing with Betadine soaked gauze was lightly applied sponge and needle counts were correct and patient to recovery room in stable condition

## 2017-10-02 NOTE — Transfer of Care (Signed)
Immediate Anesthesia Transfer of Care Note  Patient: Earnstine RegalMisty W Kamel  Procedure(s) Performed: HYSTERECTOMY VAGINAL (N/A ) POSTERIOR REPAIR (RECTOCELE) (N/A )  Patient Location: PACU  Anesthesia Type:General  Level of Consciousness: drowsy and patient cooperative  Airway & Oxygen Therapy: Patient Spontanous Breathing and Patient connected to face mask oxygen  Post-op Assessment: Report given to RN and Post -op Vital signs reviewed and stable  Post vital signs: Reviewed and stable  Last Vitals:  Vitals:   10/02/17 1000 10/02/17 1005  BP: (!) 136/91 134/88  Pulse:    Resp: 17 12  Temp:    SpO2: 100% 100%    Last Pain:  Vitals:   10/02/17 0842  TempSrc: Oral  PainSc: 5       Patients Stated Pain Goal: 8 (10/02/17 [id number])  Complications: No apparent anesthesia complications

## 2017-10-02 NOTE — Progress Notes (Signed)
cc'ed to pcp °

## 2017-10-02 NOTE — Anesthesia Postprocedure Evaluation (Signed)
Anesthesia Post Note  Patient: Ruth Shah  Procedure(s) Performed: HYSTERECTOMY VAGINAL (N/A ) POSTERIOR REPAIR (RECTOCELE) (N/A )  Patient location during evaluation: PACU Anesthesia Type: General Level of consciousness: oriented, awake and patient cooperative Pain management: pain level controlled Vital Signs Assessment: post-procedure vital signs reviewed and stable Respiratory status: spontaneous breathing Cardiovascular status: stable Postop Assessment: no apparent nausea or vomiting Anesthetic complications: no     Last Vitals:  Vitals:   10/02/17 1235 10/02/17 1245  BP: (!) 155/105 (!) 147/98  Pulse: 85 83  Resp: 15 14  Temp: 36.5 C   SpO2: 100% 100%    Last Pain:  Vitals:   10/02/17 1235  TempSrc:   PainSc: Asleep                 Sheccid Lahmann A

## 2017-10-02 NOTE — Op Note (Signed)
Please see the brief operative note for surgical details 

## 2017-10-02 NOTE — Anesthesia Procedure Notes (Signed)
Procedure Name: Intubation Date/Time: 10/02/2017 10:28 AM Performed by: Andree Elk, Amy A, CRNA Pre-anesthesia Checklist: Patient identified, Patient being monitored, Timeout performed, Emergency Drugs available and Suction available Patient Re-evaluated:Patient Re-evaluated prior to induction Oxygen Delivery Method: Circle System Utilized Preoxygenation: Pre-oxygenation with 100% oxygen Induction Type: IV induction and Cricoid Pressure applied Ventilation: Mask ventilation without difficulty Laryngoscope Size: 3 and Mac Grade View: Grade I Tube type: Oral Tube size: 7.0 mm Number of attempts: 1 Airway Equipment and Method: Stylet Placement Confirmation: ETT inserted through vocal cords under direct vision,  positive ETCO2 and breath sounds checked- equal and bilateral Secured at: 21 cm Tube secured with: Tape Dental Injury: Teeth and Oropharynx as per pre-operative assessment

## 2017-10-02 NOTE — Anesthesia Preprocedure Evaluation (Signed)
Anesthesia Evaluation  Patient identified by MRN, date of birth, ID band Patient awake    Reviewed: Allergy & Precautions, NPO status , Patient's Chart, lab work & pertinent test results  History of Anesthesia Complications (+) PONV and history of anesthetic complications  Airway Mallampati: III  TM Distance: >3 FB     Dental  (+) Teeth Intact   Pulmonary    breath sounds clear to auscultation       Cardiovascular negative cardio ROS   Rhythm:Regular Rate:Normal     Neuro/Psych  Headaches, PSYCHIATRIC DISORDERS Depression Paresthesia of both hands     GI/Hepatic GERD (dysphagia)  ,  Endo/Other    Renal/GU      Musculoskeletal  (+) Arthritis , Fibromyalgia -  Abdominal   Peds  Hematology   Anesthesia Other Findings   Reproductive/Obstetrics                             Anesthesia Physical Anesthesia Plan  ASA: II  Anesthesia Plan: General   Post-op Pain Management:    Induction: Intravenous, Cricoid pressure planned and Rapid sequence  PONV Risk Score and Plan:   Airway Management Planned: Oral ETT  Additional Equipment:   Intra-op Plan:   Post-operative Plan: Extubation in OR  Informed Consent: I have reviewed the patients History and Physical, chart, labs and discussed the procedure including the risks, benefits and alternatives for the proposed anesthesia with the patient or authorized representative who has indicated his/her understanding and acceptance.     Plan Discussed with:   Anesthesia Plan Comments:         Anesthesia Quick Evaluation

## 2017-10-03 ENCOUNTER — Encounter (HOSPITAL_COMMUNITY): Payer: Self-pay | Admitting: Obstetrics and Gynecology

## 2017-10-03 DIAGNOSIS — N816 Rectocele: Secondary | ICD-10-CM | POA: Diagnosis not present

## 2017-10-03 LAB — BASIC METABOLIC PANEL
Anion gap: 7 (ref 5–15)
BUN: 10 mg/dL (ref 6–20)
CALCIUM: 8.2 mg/dL — AB (ref 8.9–10.3)
CHLORIDE: 109 mmol/L (ref 101–111)
CO2: 24 mmol/L (ref 22–32)
CREATININE: 0.67 mg/dL (ref 0.44–1.00)
GFR calc Af Amer: >60 mL/min (ref >60)
Glucose, Bld: 99 mg/dL (ref 65–99)
Potassium: 4 mmol/L (ref 3.5–5.1)
SODIUM: 140 mmol/L (ref 135–145)

## 2017-10-03 LAB — CBC
HCT: 36.4 % (ref 36.0–46.0)
Hemoglobin: 11.8 g/dL — ABNORMAL LOW (ref 12.0–15.0)
MCH: 30.3 pg (ref 26.0–34.0)
MCHC: 32.4 g/dL (ref 30.0–36.0)
MCV: 93.6 fL (ref 78.0–100.0)
PLATELETS: 254 10*3/uL (ref 150–400)
RBC: 3.89 MIL/uL (ref 3.87–5.11)
RDW: 12.4 % (ref 11.5–15.5)
WBC: 11.8 10*3/uL — ABNORMAL HIGH (ref 4.0–10.5)

## 2017-10-03 MED ORDER — TRAMADOL HCL 50 MG PO TABS
50.0000 mg | ORAL_TABLET | Freq: Once | ORAL | Status: AC
  Administered 2017-10-03: 50 mg via ORAL
  Filled 2017-10-03: qty 1

## 2017-10-03 MED ORDER — PROMETHAZINE HCL 25 MG PO TABS: 25.0000 mg | ORAL_TABLET | Freq: Four times a day (QID) | ORAL | 0 refills | Status: DC | PRN

## 2017-10-03 MED ORDER — TRAMADOL HCL 50 MG PO TABS: 50.0000 mg | ORAL_TABLET | Freq: Four times a day (QID) | ORAL | 0 refills | Status: DC | PRN

## 2017-10-03 MED ORDER — PROMETHAZINE HCL 12.5 MG PO TABS
25.0000 mg | ORAL_TABLET | Freq: Once | ORAL | Status: AC
  Administered 2017-10-03: 25 mg via ORAL
  Filled 2017-10-03: qty 2

## 2017-10-03 MED ORDER — DOCUSATE SODIUM 100 MG PO CAPS: 100.0000 mg | ORAL_CAPSULE | Freq: Two times a day (BID) | ORAL | 2 refills | Status: DC | PRN

## 2017-10-03 NOTE — Progress Notes (Signed)
IV removed, patient tolerated well, 2x2 gauze and paper tape applied to site.  Reviewed  AVS with patient and patient verbalized understanding.  Hard copy prescription given to patient for Tramadol 50 MG and Phenergan 25 mg. Patient transported home by family. Taken to car via wheelchair.

## 2017-10-03 NOTE — Discharge Summary (Signed)
Physician Discharge Summary  Patient ID: Ruth Shah MRN: [id number] DOB/AGE: 1976/07/11 42 y.o.  Admit date: 10/02/2017 Discharge date: 10/03/2017  Admission Diagnoses:  Discharge Diagnoses:  Active Problems:   Rectocele, female   Hematometra   Pelvic pain in female   Status post vaginal hysterectomy   Discharged Condition: good  Hospital Course: Preoperative History and Physical  Ruth Shah a 42 y.o.[id number] here for surgical management of retroverted uterus,withrectocele based on hx. No significant preoperative concerns. Ultrasound was reviewed.She is status post endometrial ablation and has a small fluid collection in the endometrial cavity. 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 She had a bowel prep yesterday, with last evacuation early this morning. We have discussed the pro and con of tubal removal, and have agreed to leave the tube in situ, to reduce adhesion tendencies.   Proposed surgery:vaginal hysterectomy, posterior repair Surgery, vaginal hysterectomy and posterior repair was performed on 10/02/2017.  Consults: None  Significant Diagnostic Studies: labs:  CBC Latest Ref Rng & Units 10/03/2017 09/27/2017 12/25/2016  WBC 4.0 - 10.5 K/uL 11.8(H) 10.9(H) 8.1  Hemoglobin 12.0 - 15.0 g/dL 11.8(L) 13.8 13.5  Hematocrit 36.0 - 46.0 % 36.4 41.9 40.8  Platelets 150 - 400 K/uL 254 327 265   CMP Latest Ref Rng & Units 10/03/2017 09/27/2017 12/25/2016  Glucose 65 - 99 mg/dL 99 147(W) 80  BUN 6 - 20 mg/dL 10 16 12   Creatinine 0.44 - 1.00 mg/dL 2.95 6.21 3.08  Sodium 135 - 145 mmol/L 140 137 140  Potassium 3.5 - 5.1 mmol/L 4.0 3.7 3.6  Chloride 101 - 111 mmol/L 109 103 106  CO2 22 - 32 mmol/L 24 26 26   Calcium 8.9 - 10.3 mg/dL 8.2(L) 9.1 9.1  Total Protein 6.5 - 8.1 g/dL - 7.2 -  Total Bilirubin 0.3 - 1.2 mg/dL - 1.3(H) -  Alkaline Phos 38 - 126 U/L - 73 -  AST 15 - 41 U/L - 19 -  ALT 14 - 54 U/L - 20 -     Treatments:  surgery: Vaginal hysterectomy posterior repair  Discharge Exam: Blood pressure 117/64, pulse 66, temperature 98.4 F (36.9 C), temperature source Oral, resp. rate 16, height 5\' 3"  (1.6 m), weight 175 lb (79.4 kg), SpO2 99 %. General appearance: alert, cooperative and no distress Head: Normocephalic, without obvious abnormality, atraumatic Resp: clear to auscultation bilaterally GI: soft, non-tender; bowel sounds normal; no masses,  no organomegaly Extremities: extremities normal, atraumatic, no cyanosis or edema  Disposition: 01-Home or Self Care  Discharge Instructions    Call MD for:  persistant nausea and vomiting   Complete by:  As directed    Call MD for:  severe uncontrolled pain   Complete by:  As directed    Call MD for:  temperature >100.4   Complete by:  As directed    Diet - low sodium heart healthy   Complete by:  As directed    Driving Restrictions   Complete by:  As directed    No driving x 1 week then limited to local activity til followup appt.   Increase activity slowly   Complete by:  As directed    Lifting restrictions   Complete by:  As directed    No lifting over 15 pounds   Sexual Activity Restrictions   Complete by:  As directed    None til released at 6 wk     Allergies as of 10/03/2017      Reactions  Sulfa Antibiotics Nausea Only   Toradol [ketorolac Tromethamine] Nausea Only   Vicodin [hydrocodone-acetaminophen] Itching      Medication List    TAKE these medications   ADDERALL XR 20 MG 24 hr capsule Generic drug:  amphetamine-dextroamphetamine Take 20 mg by mouth every morning.   docusate sodium 100 MG capsule Commonly known as:  COLACE Take 1 capsule (100 mg total) by mouth 2 (two) times daily as needed.   naproxen 500 MG tablet Commonly known as:  NAPROSYN Take 500 mg by mouth daily as needed for moderate pain.   pantoprazole 40 MG tablet Commonly known as:  PROTONIX Take 1 tablet (40 mg total) by mouth 2 (two) times daily before  a meal.   promethazine 25 MG tablet Commonly known as:  PHENERGAN Take 1 tablet (25 mg total) by mouth every 6 (six) hours as needed for nausea or vomiting (nausea).   sertraline 50 MG tablet Commonly known as:  ZOLOFT Take 50 mg by mouth daily.   traMADol 50 MG tablet Commonly known as:  ULTRAM Take 1 tablet (50 mg total) by mouth every 6 (six) hours as needed for moderate pain or severe pain.      Follow-up Information    Tilda BurrowFerguson, Israel Wunder V, MD Follow up in 1 week(s).   Specialties:  Obstetrics and Gynecology, Radiology Contact information: [address] [place] [address] [phone]           Signed: Tilda BurrowJohn V Maryanna Stuber 10/03/2017, 8:28 AM

## 2017-10-03 NOTE — Progress Notes (Signed)
Vaginal packing removed. Patient tolerated well.  

## 2017-10-03 NOTE — Discharge Instructions (Signed)
Vaginal Hysterectomy, Care After °Refer to this sheet in the next few weeks. These instructions provide you with information about caring for yourself after your procedure. Your health care provider may also give you more specific instructions. Your treatment has been planned according to current medical practices, but problems sometimes occur. Call your health care provider if you have any problems or questions after your procedure. °What can I expect after the procedure? °After the procedure, it is common to have: °· Pain. °· Soreness and numbness in your incision areas. °· Vaginal bleeding and discharge. °· Constipation. °· Temporary problems emptying the bladder. °· Feelings of sadness or other emotions. ° °Follow these instructions at home: °Medicines °· Take over-the-counter and prescription medicines only as told by your health care provider. °· If you were prescribed an antibiotic medicine, take it as told by your health care provider. Do not stop taking the antibiotic even if you start to feel better. °· Do not drive or operate heavy machinery while taking prescription pain medicine. °Activity °· Return to your normal activities as told by your health care provider. Ask your health care provider what activities are safe for you. °· Get regular exercise as told by your health care provider. You may be told to take short walks every day and go farther each time. °· Do not lift anything that is heavier than 10 lb (4.5 kg). °General instructions ° °· Do not put anything in your vagina for 6 weeks after your surgery or as told by your health care provider. This includes tampons and douches. °· Do not have sex until your health care provider says you can. °· Do not take baths, swim, or use a hot tub until your health care provider approves. °· Drink enough fluid to keep your urine clear or pale yellow. °· Do not drive for 24 hours if you were given a sedative. °· Keep all follow-up visits as told by your health  care provider. This is important. °Contact a health care provider if: °· Your pain medicine is not helping. °· You have a fever. °· You have redness, swelling, or pain at your incision site. °· You have blood, pus, or a bad-smelling discharge from your vagina. °· You continue to have difficulty urinating. °Get help right away if: °· You have severe abdominal or back pain. °· You have heavy bleeding from your vagina. °· You have chest pain or shortness of breath. °This information is not intended to replace advice given to you by your health care provider. Make sure you discuss any questions you have with your health care provider. °Document Released: 01/03/2016 Document Revised: 02/17/2016 Document Reviewed: 09/26/2015 °Elsevier Interactive Patient Education © 2018 [employer] ° °

## 2017-10-03 NOTE — Progress Notes (Signed)
Verbal order received from Dr. Emelda FearFerguson to remove vaginal packing.

## 2017-10-10 ENCOUNTER — Ambulatory Visit (INDEPENDENT_AMBULATORY_CARE_PROVIDER_SITE_OTHER): Payer: Medicaid Other | Admitting: Obstetrics and Gynecology

## 2017-10-10 ENCOUNTER — Encounter: Payer: Self-pay | Admitting: Obstetrics and Gynecology

## 2017-10-10 VITALS — BP 126/84 | HR 95 | Ht 63.0 in | Wt 176.8 lb

## 2017-10-10 DIAGNOSIS — Z9889 Other specified postprocedural states: Secondary | ICD-10-CM

## 2017-10-10 DIAGNOSIS — Z09 Encounter for follow-up examination after completed treatment for conditions other than malignant neoplasm: Secondary | ICD-10-CM

## 2017-10-10 MED ORDER — CIPROFLOXACIN HCL 500 MG PO TABS: 500.0000 mg | ORAL_TABLET | Freq: Two times a day (BID) | ORAL | 0 refills | Status: DC

## 2017-10-10 NOTE — Progress Notes (Signed)
   Subjective:  Ruth Shah is a 42 y.o. female now 1 weeks status post vaginal hysterectomy and posterior colporrhaphy.   Not checking fever. Review of Systems Negative except pt felt fine x a few days, bm's are better but now bladder spasms and crampy LAP noted. No fever   Diet:   reg   Bowel movements : improved.  Pain is controlled with current analgesics. Medications being used: ibuprofen (OTC).  Objective:  BP 126/84 (BP Location: Right Arm, Patient Position: Sitting, Cuff Size: Normal)   Pulse 95   Ht 5\' 3"  (1.6 m)   Wt 176 lb 12.8 oz (80.2 kg)   BMI 31.32 kg/m  General:Well developed, well nourished.  No acute distress. Abdomen: Bowel sounds normal, soft, non-tender. Pelvic Exam:    External Genitalia:  Normal. Snug posterior repair    Vagina: Normal moderate d/c with some purulence and blood    Cervix: absent    Uterus: absent    Adnexa/Bimanual: Normal  Incision(s):   Healing a, no drainage, no erythema, no hernia, no swelling, no dehiscence,     Assessment:  Post-Op 1 weeks s/p vaginal hysterectomy and posterior colporrhaphy   will treat empirically with Cipro and recheck in a week Stable  postoperatively.   Plan:  1.Wound care discussed  To check temp q d 2. . current medications.tramadol, will add cipro 3. Activity restrictions: none 4. return to work: 4 weeks. 5. Follow up in 1 week.

## 2017-10-12 NOTE — Progress Notes (Signed)
Left message to confirm appointment on 10/15/2017 at 1030. Reminded NPO within 6 hours of test. Left number [phone] to call with questions.

## 2017-10-15 ENCOUNTER — Encounter (HOSPITAL_COMMUNITY)
Admission: RE | Disposition: A | Discharge status: Home / Self Care | Payer: Self-pay | Source: Ambulatory Visit | Attending: Gastroenterology

## 2017-10-15 ENCOUNTER — Ambulatory Visit (HOSPITAL_COMMUNITY)
Admission: RE | Admit: 2017-10-15 | Discharge: 2017-10-15 | Disposition: A | Discharge status: Home / Self Care | Payer: Medicaid Other | Source: Ambulatory Visit | Attending: Gastroenterology | Admitting: Gastroenterology

## 2017-10-15 ENCOUNTER — Encounter (HOSPITAL_COMMUNITY): Payer: Self-pay

## 2017-10-15 DIAGNOSIS — K219 Gastro-esophageal reflux disease without esophagitis: Secondary | ICD-10-CM

## 2017-10-15 DIAGNOSIS — K449 Diaphragmatic hernia without obstruction or gangrene: Secondary | ICD-10-CM | POA: Diagnosis not present

## 2017-10-15 HISTORY — PX: ESOPHAGEAL MANOMETRY: SHX5429

## 2017-10-15 SURGERY — MANOMETRY, ESOPHAGUS

## 2017-10-15 MED ORDER — LIDOCAINE VISCOUS 2 % MT SOLN
OROMUCOSAL | Status: AC
  Filled 2017-10-15: qty 15

## 2017-10-15 SURGICAL SUPPLY — 2 items
FACESHIELD LNG OPTICON STERILE (SAFETY) IMPLANT
GLOVE BIO SURGEON STRL SZ8 (GLOVE) ×6 IMPLANT

## 2017-10-15 NOTE — Progress Notes (Signed)
Esophageal manometry done per protocol.  Pt. Tolerated well, without complication.  Dr. Lavon PaganiniNandigam to be notified of study today.  Omelia BlackwaterShelby Catalea Labrecque, RN

## 2017-10-16 ENCOUNTER — Encounter (HOSPITAL_COMMUNITY): Payer: Self-pay | Admitting: Gastroenterology

## 2017-10-18 ENCOUNTER — Other Ambulatory Visit: Payer: Self-pay

## 2017-10-18 ENCOUNTER — Encounter: Payer: Self-pay | Admitting: Obstetrics and Gynecology

## 2017-10-18 ENCOUNTER — Ambulatory Visit (INDEPENDENT_AMBULATORY_CARE_PROVIDER_SITE_OTHER): Payer: Medicaid Other | Admitting: Obstetrics and Gynecology

## 2017-10-18 VITALS — BP 122/84 | HR 79 | Ht 63.0 in | Wt 175.0 lb

## 2017-10-18 DIAGNOSIS — Z90710 Acquired absence of both cervix and uterus: Secondary | ICD-10-CM

## 2017-10-18 DIAGNOSIS — Z9889 Other specified postprocedural states: Secondary | ICD-10-CM

## 2017-10-18 MED ORDER — METRONIDAZOLE 0.75 % VA GEL: 1.0000 | Freq: Every day | VAGINAL | 1 refills | Status: DC

## 2017-10-18 NOTE — Progress Notes (Signed)
° °  Subjective:  Ruth Shah is a 42 y.o. female now 2 weeks status post vaginal hysterectomy, posterior repair.  She was having a heavy d/c and was placed emprically on cipro at last visit.  Review of Systems Negative except some soreness.Pt did experience cramping abdominal pain about 1 week ago and was prescribed antibiotics. Reports no fever in the past week.  She reports bowel movements have been a lot easier to pass.    Diet:   normal   Bowel movements : normal., improved  Pain is controlled without any medications.  Objective:  BP 122/84 (BP Location: Left Arm, Patient Position: Sitting, Cuff Size: Normal)    Pulse 79    Ht 5\' 3"  (1.6 m)    Wt 175 lb (79.4 kg)    BMI 31.00 kg/m  General:Well developed, well nourished.  No acute distress. Abdomen: Bowel sounds normal, soft, non-tender. Pelvic Exam:    External Genitalia:  Normal, good posterior repair    Vagina: Normal, increased discharge    Cervix: absent    Uterus: Absent    Adnexa/Bimanual: Normal  Incision(s):   Healing well, no drainage, no erythema, no hernia, no swelling, no dehiscence,   Assessment:  Post-Op 2 weeks status post vaginal hysterectomy, posterior repair/ colporrhaphy.   Doing well postoperatively. Some stitches removed. Top of vagina is slightly stiff, moderate vag d/c .   Plan:  1.Wound care discussed  2. . current medications. Rx Metrogel 3. Activity restrictions: no lifting more than 25 pounds 4. return to work: 4 weeks. 5. Follow up in 4 weeks for post-op   By signing my name below, I, Izna Ahmed, attest that this documentation has been prepared under the direction and in the presence of Tilda BurrowFerguson, John V, MD. Electronically Signed: Redge GainerIzna Ahmed, Medical Scribe. 10/18/17. 9:43 AM.  I personally performed the services described in this documentation, which was SCRIBED in my presence. The recorded information has been reviewed and considered accurate. It has been edited as necessary during  review. Tilda BurrowJohn V Ferguson, MD

## 2017-10-23 DIAGNOSIS — K219 Gastro-esophageal reflux disease without esophagitis: Secondary | ICD-10-CM

## 2017-10-23 DIAGNOSIS — K449 Diaphragmatic hernia without obstruction or gangrene: Secondary | ICD-10-CM

## 2017-10-23 NOTE — Progress Notes (Signed)
mailed

## 2017-10-25 ENCOUNTER — Encounter (INDEPENDENT_AMBULATORY_CARE_PROVIDER_SITE_OTHER): Payer: Self-pay | Admitting: Orthopaedic Surgery

## 2017-10-25 ENCOUNTER — Ambulatory Visit (INDEPENDENT_AMBULATORY_CARE_PROVIDER_SITE_OTHER): Payer: Medicaid Other | Admitting: Orthopaedic Surgery

## 2017-10-25 VITALS — BP 119/72 | HR 79

## 2017-10-25 DIAGNOSIS — G8929 Other chronic pain: Secondary | ICD-10-CM

## 2017-10-25 DIAGNOSIS — M545 Low back pain, unspecified: Secondary | ICD-10-CM

## 2017-10-25 NOTE — Progress Notes (Signed)
Office Visit Note   Patient: Ruth Shah           Date of Birth: 12/17/1975           MRN: [id number] Visit Date: 10/25/2017              Requested by: Selinda FlavinHoward, Kevin, MD [address] [place], [place] [id number] PCP: Selinda FlavinHoward, Kevin, MD   Assessment & Plan: Visit Diagnoses: No diagnosis found.  Plan: Good relief from her epidural lasted for several months she did have some problems with migraine which was triggered by the epidural she believes and had to take some Imitrex.  She states she like to have another injection she is getting ready to go back to work in a couple weeks after recent hysterectomy which is doing well.  We will schedule her for a second epidural in office follow-up with me on a as needed basis.  Follow-Up Instructions: No Follow-up on file.   Orders:  No orders of the defined types were placed in this encounter.  No orders of the defined types were placed in this encounter.     Procedures: No procedures performed   Clinical Data: No additional findings.   Subjective: Chief Complaint  Patient presents with  . Lower Back - Pain    HPI patient returns with some ongoing neck pain with some mild spondylosis C5-6 C6-7 and also low back pain with some disc degeneration at L5-S1 with mild disc bulge.  All other levels above L5-S1 looked normal with good hydration and no narrowing.  She got good relief from the epidural for about 2 months that was done by Dr. Alvester MorinNewton in the fall.  She requested a repeat injection since she is getting ready to go back to work in a couple weeks after she convalesces from her recent hysterectomy.  She states that she primarily has back pain and bothers her when she stands and fold closed.  She has to sit or lay down to get some relief.  Occasionally she is had some numbness or tingling that went into her left foot.  Denies any associated bowel or bladder symptoms.  Review of Systems updated unchanged other than as mentioned  above.   Objective: Vital Signs: BP 119/72   Pulse 79   Physical Exam  Constitutional: She is oriented to person, place, and time. She appears well-developed.  HENT:  Head: Normocephalic.  Right Ear: External ear normal.  Left Ear: External ear normal.  Eyes: Pupils are equal, round, and reactive to light.  Neck: No tracheal deviation present. No thyromegaly present.  Cardiovascular: Normal rate.  Pulmonary/Chest: Effort normal.  Abdominal: Soft.  Neurological: She is alert and oriented to person, place, and time.  Skin: Skin is warm and dry.  Psychiatric: She has a normal mood and affect. Her behavior is normal.    Ortho Exam lumbar spine is normal negative straight leg raising 90 degrees.  Mild sciatic notch tenderness right and left.  No trochanteric bursal tenderness.  Gluteus muscle quads gastrocsoleus are strong.  She gets from sitting to standing and can walk heel toe gait without weakness.  Popliteal compression test.  Negative logroll the hips.  Specialty Comments:  No specialty comments available.  Imaging: No results found.   PMFS History: Patient Active Problem List   Diagnosis Date Noted  . Gastroesophageal reflux disease   . Hiatal hernia   . Hematometra 10/02/2017  . Pelvic pain in female 10/02/2017  . Status post vaginal  hysterectomy 10/02/2017  . Rectal bleeding 07/26/2017  . Rectocele, female 06/28/2017  . Uterine prolapse 06/28/2017  . Diarrhea 04/26/2017  . Chronic bilateral low back pain without sciatica 02/15/2017  . Dyspepsia 12/21/2016  . Dysphagia 12/21/2016  . Nausea without vomiting 12/21/2016  . Chronic migraine 07/20/2016  . Neck pain 07/20/2016  . 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 H/O Genital warts 08/12/2015   Past Medical History:  Diagnosis Date  . ADD (attention deficit disorder)   . Arthritis   . Asthma    mild seasonal reactions to mold and mildew  . Depression   . Fibromyalgia   . GERD (gastroesophageal reflux disease)   .  Herpes simplex disease   . History of kidney stones   . Migraines   . Paresthesia of both hands   . PONV (postoperative nausea and vomiting)     Family History  Problem Relation Age of Onset  . Cancer Mother        skin  . Depression Mother   . Hypertension Mother   . Hyperlipidemia Mother   . Cancer Father        skin  . Arthritis Father   . Hypertension Father   . Hyperlipidemia Father   . Depression Maternal Grandmother   . Hypertension Maternal Grandmother   . Mental illness Maternal Grandmother   . Cancer Maternal Grandfather        kidney  . Hypertension Maternal Grandfather   . Kidney disease Maternal Grandfather   . Emphysema Paternal Grandmother   . Alzheimer's disease Paternal Grandmother   . Parkinson's disease Paternal Grandfather   . Colon cancer Neg Hx   . Colon polyps Neg Hx     Past Surgical History:  Procedure Laterality Date  . ENDOMETRIAL ABLATION    . ESOPHAGEAL MANOMETRY N/A 10/15/2017   Procedure: ESOPHAGEAL MANOMETRY (EM);  Surgeon: Napoleon Form, MD;  Location: [facility];  Service: Endoscopy;  Laterality: N/A;  . ESOPHAGOGASTRODUODENOSCOPY (EGD) WITH PROPOFOL N/A 12/26/2016   Dr. Darrick Penna: one benign-appearing, intrinsic stenosis that was widely patent and traversed. Small hiatal hernia, few small sessile polyps. Chronic gastritis. Normal duodenum, negative celiac sprue.   Marland Kitchen HERNIA REPAIR     inguinal as a baby   . left ovarian removal due to cyst    . PILONIDAL CYST / SINUS EXCISION  2009  . RECTOCELE REPAIR N/A 10/02/2017   Procedure: POSTERIOR REPAIR (RECTOCELE);  Surgeon: Tilda Burrow, MD;  Location: [facility];  Service: Gynecology;  Laterality: N/A;  . TUBAL LIGATION    . VAGINAL HYSTERECTOMY N/A 10/02/2017   Procedure: HYSTERECTOMY VAGINAL;  Surgeon: Tilda Burrow, MD;  Location: [facility];  Service: Gynecology;  Laterality: N/A;   Social History   Occupational History  . Not on file  Tobacco Use  . Smoking status: Never Smoker  .  Smokeless tobacco: Never Used  Substance and Sexual Activity  . Alcohol use: No    Alcohol/week: 0.0 oz  . Drug use: No  . Sexual activity: Not Currently    Birth control/protection: Surgical    Comment: tubal

## 2017-10-25 NOTE — Addendum Note (Signed)
Addended by: Rogers SeedsYEATTS, Celeste Candelas M on: 10/25/2017 10:24 AM   Modules accepted: Orders

## 2017-10-26 ENCOUNTER — Telehealth: Payer: Self-pay

## 2017-10-26 NOTE — Telephone Encounter (Signed)
Faxed esophageal manometry report to [facility] 

## 2017-10-31 ENCOUNTER — Telehealth: Payer: Self-pay | Admitting: Obstetrics and Gynecology

## 2017-10-31 ENCOUNTER — Encounter: Payer: Medicaid Other | Admitting: Obstetrics and Gynecology

## 2017-10-31 NOTE — Telephone Encounter (Signed)
LMOVM that per Dr Emelda FearFerguson she may received spinal injection if needed. Will fax today.

## 2017-11-15 ENCOUNTER — Ambulatory Visit (INDEPENDENT_AMBULATORY_CARE_PROVIDER_SITE_OTHER): Payer: Self-pay

## 2017-11-15 ENCOUNTER — Ambulatory Visit (INDEPENDENT_AMBULATORY_CARE_PROVIDER_SITE_OTHER): Payer: Medicaid Other | Admitting: Physical Medicine and Rehabilitation

## 2017-11-15 ENCOUNTER — Encounter (INDEPENDENT_AMBULATORY_CARE_PROVIDER_SITE_OTHER): Payer: Self-pay | Admitting: Physical Medicine and Rehabilitation

## 2017-11-15 ENCOUNTER — Ambulatory Visit (INDEPENDENT_AMBULATORY_CARE_PROVIDER_SITE_OTHER): Payer: Medicaid Other | Admitting: Obstetrics and Gynecology

## 2017-11-15 ENCOUNTER — Encounter: Payer: Self-pay | Admitting: Obstetrics and Gynecology

## 2017-11-15 ENCOUNTER — Encounter: Payer: Medicaid Other | Admitting: Obstetrics and Gynecology

## 2017-11-15 VITALS — BP 133/85 | HR 80 | Temp 98.4°F

## 2017-11-15 VITALS — BP 128/80 | HR 95 | Wt 178.4 lb

## 2017-11-15 DIAGNOSIS — M5416 Radiculopathy, lumbar region: Secondary | ICD-10-CM | POA: Diagnosis not present

## 2017-11-15 DIAGNOSIS — Z09 Encounter for follow-up examination after completed treatment for conditions other than malignant neoplasm: Secondary | ICD-10-CM

## 2017-11-15 DIAGNOSIS — Z9889 Other specified postprocedural states: Secondary | ICD-10-CM

## 2017-11-15 MED ORDER — METHYLPREDNISOLONE ACETATE 80 MG/ML IJ SUSP
80.0000 mg | Freq: Once | INTRAMUSCULAR | Status: AC
  Administered 2017-11-15: 80 mg

## 2017-11-15 NOTE — Progress Notes (Signed)
Patient ID: Ruth Shah, female   DOB: 01-21-1976, 42 y.o.   MRN: [id number]    Subjective:  Ruth Shah is a 42 y.o. female now 6 weeks status post vaginal hysterectomy and posterior repair (rectocele).   She was seen on 10/18/2017 and reported a heavy discharge. She was prescribed Metrogel at that time. The patient reports that the discharge has improved.   Review of Systems Negative   Diet:   normal   Bowel movements : normal.  The patient is not having any pain.  Objective:  There were no vitals taken for this visit. General:Well developed, well nourished.  No acute distress. Abdomen: Bowel sounds normal, soft, non-tender. Pelvic Exam:    External Genitalia:  Normal.    Vagina: well supported, good length, 90% of vaginal stiffness has gone away    Cervix: Surgically Absent    Uterus: Surgically Absent    Adnexa/Bimanual: Normal  Incision(s):   Healing well, no drainage, no erythema, no hernia, no swelling, no dehiscence,   Discussion: Discussed with pt weight loss methods including food measurement and calorie counting using apps such as MyNetDiary or My Fitness Pal. Recommended the use of measuring cups and spoons to monitor serving sizes. Encouraged adequate daily water intake, especially prior to meals to eliminate overeating. Additionally encouraged pt to become more active by taking daily walks of at least 30 minute duration, join a local gym such as the [employer] or attend water aerobics classes. Also advised pt to use pedometer on smartphone or utilize a smartband such as FitBit to keep track of daily activity.   At end of discussion, pt had opportunity to ask questions and has no further questions at this time.   Specific discussion of lifestyle changes, behavioral modifications and healthy eating habits as noted above. Greater than 50% was spent in counseling and coordination of care with the patient.  Total time greater than: 25 minutes.    Assessment:  Post-Op 6  weeks s/p vaginal hysterectomy and posterior repair (rectocele)  Fibromyalgia Chronic back pain with degenerative disease Doing well postoperatively.   Plan:  1.Wound care discussed   2. . current medications. 3. Activity restrictions: none 4. return to work: now. 5. Follow up 1 year or PRN  By signing my name below, I, Diona BrownerJennifer Gorman, attest that this documentation has been prepared under the direction and in the presence of Tilda BurrowFerguson, Delanee Xin V, MD. Electronically Signed: Diona BrownerJennifer Gorman, Medical Scribe. 11/15/17. 10:36 AM.  I personally performed the services described in this documentation, which was SCRIBED in my presence. The recorded information has been reviewed and considered accurate. It has been edited as necessary during review. Tilda BurrowJohn V Valla Pacey, MD

## 2017-11-15 NOTE — Patient Instructions (Signed)

## 2017-11-15 NOTE — Progress Notes (Deleted)
Pt states dull aching pain in lower back sometimes down her left leg. Pt states pain started back October 2018, 2 months after last injection 04/26/17. Pt states bending, sitting, and standing for a long period of time makes pain worse, nothing makes pain better. +Driver, -BT, -Dye Allergies.

## 2017-11-15 NOTE — Procedures (Signed)
Lumbar Epidural Steroid Injection - Interlaminar Approach with Fluoroscopic Guidance  Patient: Ruth RegalMisty W Shah      Date of Birth: 1975-12-09 MRN: [id number] PCP: Selinda FlavinHoward, Kevin, MD      Visit Date: 11/15/2017   Universal Protocol:     Consent Given By: the patient  Position: PRONE  Additional Comments: Vital signs were monitored before and after the procedure. Patient was prepped and draped in the usual sterile fashion. The correct patient, procedure, and site was verified.   Injection Procedure Details:  Procedure Site One Meds Administered:  Meds ordered this encounter  Medications  . methylPREDNISolone acetate (DEPO-MEDROL) injection 80 mg     Laterality: Left  Location/Site:  L5-S1  Needle size: 20 G  Needle type: Tuohy  Needle Placement: Paramedian epidural  Findings:   -Comments: Excellent flow of contrast into the epidural space.  Procedure Details: Using a paramedian approach from the side mentioned above, the region overlying the inferior lamina was localized under fluoroscopic visualization and the soft tissues overlying this structure were infiltrated with 4 ml. of 1% Lidocaine without Epinephrine. The Tuohy needle was inserted into the epidural space using a paramedian approach.   The epidural space was localized using loss of resistance along with lateral and bi-planar fluoroscopic views.  After negative aspirate for air, blood, and CSF, a 2 ml. volume of Isovue-250 was injected into the epidural space and the flow of contrast was observed. Radiographs were obtained for documentation purposes.    The injectate was administered into the level noted above.   Additional Comments:  The patient tolerated the procedure well Dressing: Band-Aid    Post-procedure details: Patient was observed during the procedure. Post-procedure instructions were reviewed.  Patient left the clinic in stable condition.

## 2017-11-23 NOTE — Progress Notes (Signed)
Ruth Shah - 42 y.o. female MRN [id number]  Date of birth: 03-03-1976  Office Visit Note: Visit Date: 11/15/2017 PCP: Selinda Flavin, MD Referred by: Selinda Flavin, MD  Subjective: Chief Complaint  Patient presents with  . Lower Back - Pain  . Left Leg - Pain   HPI: Ruth Shah is a 42 year old female that comes in today  at the request of Dr. Ophelia Charter for repeat left interlaminar epidural steroid injection at L5-S1.  We saw her in August of last year and completed similar injection with good relief of her symptoms.  She probably got 3 months or more of good relief and then return of symptoms.  She has a more right paracentral disc at L5-S1 on imaging.  She is getting radicular left leg pain.    ROS Otherwise per HPI.  Assessment & Plan: Visit Diagnoses:  1. Lumbar radiculopathy     Plan: Findings:  Left L5-S1 interlaminar epidural steroid injection.    Meds & Orders:  Meds ordered this encounter  Medications  . methylPREDNISolone acetate (DEPO-MEDROL) injection 80 mg    Orders Placed This Encounter  Procedures  . XR C-ARM NO REPORT  . Epidural Steroid injection    Follow-up: Return if symptoms worsen or fail to improve, for Dr. Ophelia Charter.   Procedures: No procedures performed  Lumbar Epidural Steroid Injection - Interlaminar Approach with Fluoroscopic Guidance  Patient: Ruth Shah      Date of Birth: 1975/10/28 MRN: [id number] PCP: Selinda Flavin, MD      Visit Date: 11/15/2017   Universal Protocol:     Consent Given By: the patient  Position: PRONE  Additional Comments: Vital signs were monitored before and after the procedure. Patient was prepped and draped in the usual sterile fashion. The correct patient, procedure, and site was verified.   Injection Procedure Details:  Procedure Site One Meds Administered:  Meds ordered this encounter  Medications  . methylPREDNISolone acetate (DEPO-MEDROL) injection 80 mg     Laterality: Left  Location/Site:    L5-S1  Needle size: 20 G  Needle type: Tuohy  Needle Placement: Paramedian epidural  Findings:   -Comments: Excellent flow of contrast into the epidural space.  Procedure Details: Using a paramedian approach from the side mentioned above, the region overlying the inferior lamina was localized under fluoroscopic visualization and the soft tissues overlying this structure were infiltrated with 4 ml. of 1% Lidocaine without Epinephrine. The Tuohy needle was inserted into the epidural space using a paramedian approach.   The epidural space was localized using loss of resistance along with lateral and bi-planar fluoroscopic views.  After negative aspirate for air, blood, and CSF, a 2 ml. volume of Isovue-250 was injected into the epidural space and the flow of contrast was observed. Radiographs were obtained for documentation purposes.    The injectate was administered into the level noted above.   Additional Comments:  The patient tolerated the procedure well Dressing: Band-Aid    Post-procedure details: Patient was observed during the procedure. Post-procedure instructions were reviewed.  Patient left the clinic in stable condition.   Clinical History: MRI LUMBAR SPINE WITHOUT CONTRAST  TECHNIQUE: Multiplanar, multisequence MR imaging of the lumbar spine was performed. No intravenous contrast was administered.  COMPARISON:  Previous MRI from 02/09/2015.  FINDINGS: Segmentation: Normal segmentation. Lowest well-formed disc is labeled the L5-S1 level.  Alignment: Vertebral bodies normally aligned with preservation of the normal lumbar lordosis. No listhesis.  Vertebrae: Vertebral body heights are well maintained. No  evidence for acute or chronic fracture. Signal intensity within the vertebral body bone marrow is within normal limits. No discrete or worrisome osseous lesions. No abnormal marrow edema.  Conus medullaris: Extends to the T12 level and appears  normal.  Paraspinal and other soft tissues: Paraspinous soft tissues within normal limits. Visualized visceral structures are normal.  Disc levels:  No significant degenerative changes are seen through the L3-4 level.  L4-5: Normal interspace. Mild facet hypertrophy. No canal or foraminal stenosis.  L5-S1: Shallow central disc protrusion, a eccentric to the right. Protruding disc contacts the descending right S1 nerve root without frank impingement. No canal stenosis. Foramina are widely patent.  IMPRESSION: 1. Shallow right eccentric disc protrusion at L5-S1, contacting and potentially irritating the descending right S1 nerve root, similar to previous. 2. Mild facet hypertrophy at L4-5. 3. Otherwise unremarkable MRI of the lumbar spine. No significant stenosis or evidence for impingement.   Electronically Signed   By: Rise MuBenjamin  McClintock M.D.   On: 04/03/2017 08:05  She reports that  has never smoked. she has never used smokeless tobacco. No results for input(s): HGBA1C, LABURIC in the last 8760 hours.  Objective:  VS:  HT:    WT:   BMI:     BP:133/85  HR:80bpm  TEMP:98.4 F (36.9 C)(Oral)  RESP:99 % Physical Exam  Ortho Exam Imaging: No results found.  Past Medical/Family/Surgical/Social History: Medications & Allergies reviewed per EMR Patient Active Problem List   Diagnosis Date Noted  . Gastroesophageal reflux disease   . Hiatal hernia   . Hematometra 10/02/2017  . Pelvic pain in female 10/02/2017  . Status post vaginal hysterectomy 10/02/2017  . Rectal bleeding 07/26/2017  . Rectocele, female 06/28/2017  . Uterine prolapse 06/28/2017  . Diarrhea 04/26/2017  . Chronic bilateral low back pain without sciatica 02/15/2017  . Dyspepsia 12/21/2016  . Dysphagia 12/21/2016  . Nausea without vomiting 12/21/2016  . Chronic migraine 07/20/2016  . Neck pain 07/20/2016  . 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 H/O Genital warts 08/12/2015   Past Medical  History:  Diagnosis Date  . ADD (attention deficit disorder)   . Arthritis   . Asthma    mild seasonal reactions to mold and mildew  . Depression   . Fibromyalgia   . GERD (gastroesophageal reflux disease)   . Herpes simplex disease   . History of kidney stones   . Migraines   . Paresthesia of both hands   . PONV (postoperative nausea and vomiting)    Family History  Problem Relation Age of Onset  . Cancer Mother        skin  . Depression Mother   . Hypertension Mother   . Hyperlipidemia Mother   . Diabetes Mother   . Cancer Father        skin  . Arthritis Father   . Hypertension Father   . Hyperlipidemia Father   . Depression Maternal Grandmother   . Hypertension Maternal Grandmother   . Mental illness Maternal Grandmother   . Cancer Maternal Grandfather        kidney  . Hypertension Maternal Grandfather   . Kidney disease Maternal Grandfather   . Emphysema Paternal Grandmother   . Alzheimer's disease Paternal Grandmother   . Parkinson's disease Paternal Grandfather   . Colon cancer Neg Hx   . Colon polyps Neg Hx    Past Surgical History:  Procedure Laterality Date  . ENDOMETRIAL ABLATION    . ESOPHAGEAL MANOMETRY N/A 10/15/2017   Procedure: ESOPHAGEAL  MANOMETRY (EM);  Surgeon: Napoleon Form, MD;  Location: [facility];  Service: Endoscopy;  Laterality: N/A;  . ESOPHAGOGASTRODUODENOSCOPY (EGD) WITH PROPOFOL N/A 12/26/2016   Dr. Darrick Penna: one benign-appearing, intrinsic stenosis that was widely patent and traversed. Small hiatal hernia, few small sessile polyps. Chronic gastritis. Normal duodenum, negative celiac sprue.   Marland Kitchen HERNIA REPAIR     inguinal as a baby   . left ovarian removal due to cyst    . PILONIDAL CYST / SINUS EXCISION  2009  . RECTOCELE REPAIR N/A 10/02/2017   Procedure: POSTERIOR REPAIR (RECTOCELE);  Surgeon: Tilda Burrow, MD;  Location: [facility];  Service: Gynecology;  Laterality: N/A;  . TUBAL LIGATION    . VAGINAL HYSTERECTOMY N/A 10/02/2017    Procedure: HYSTERECTOMY VAGINAL;  Surgeon: Tilda Burrow, MD;  Location: [facility];  Service: Gynecology;  Laterality: N/A;   Social History   Occupational History  . Not on file  Tobacco Use  . Smoking status: Never Smoker  . Smokeless tobacco: Never Used  Substance and Sexual Activity  . Alcohol use: No    Alcohol/week: 0.0 oz  . Drug use: No  . Sexual activity: Not Currently    Birth control/protection: Surgical    Comment: tubal

## 2017-11-25 ENCOUNTER — Emergency Department (HOSPITAL_COMMUNITY)
Admission: EM | Admit: 2017-11-25 | Discharge: 2017-11-25 | Disposition: A | Discharge status: Home / Self Care | Payer: Medicaid Other | Attending: Emergency Medicine | Admitting: Emergency Medicine

## 2017-11-25 ENCOUNTER — Encounter (HOSPITAL_COMMUNITY): Payer: Self-pay

## 2017-11-25 DIAGNOSIS — M5020 Other cervical disc displacement, unspecified cervical region: Secondary | ICD-10-CM | POA: Insufficient documentation

## 2017-11-25 DIAGNOSIS — G629 Polyneuropathy, unspecified: Secondary | ICD-10-CM

## 2017-11-25 DIAGNOSIS — R200 Anesthesia of skin: Secondary | ICD-10-CM | POA: Insufficient documentation

## 2017-11-25 DIAGNOSIS — M25512 Pain in left shoulder: Secondary | ICD-10-CM | POA: Diagnosis present

## 2017-11-25 DIAGNOSIS — J45909 Unspecified asthma, uncomplicated: Secondary | ICD-10-CM | POA: Insufficient documentation

## 2017-11-25 DIAGNOSIS — G5692 Unspecified mononeuropathy of left upper limb: Secondary | ICD-10-CM | POA: Diagnosis not present

## 2017-11-25 DIAGNOSIS — Z79899 Other long term (current) drug therapy: Secondary | ICD-10-CM | POA: Insufficient documentation

## 2017-11-25 HISTORY — DX: Dorsalgia, unspecified: M54.9

## 2017-11-25 MED ORDER — PREDNISONE 20 MG PO TABS: 40.0000 mg | ORAL_TABLET | Freq: Every day | ORAL | 0 refills | Status: DC

## 2017-11-25 NOTE — ED Provider Notes (Signed)
[facility] EMERGENCY DEPARTMENT Provider Note   CSN: [id number] Arrival date & time: 11/25/17  [id number]     History   Chief Complaint Chief Complaint  Patient presents with  . Shoulder Pain    HPI Ruth Shah is a 42 y.o. female.  HPI Patient presents with left shoulder pain going down her left hand.  Began yesterday.  No trauma.  No neck pain.  Does have a history of bulging disc in her neck however and is seen Dr. Ophelia CharterYates.  Has had some paresthesias in her hands in the past but none recently.  No trauma.  No fall.  No difficulty using the hand but states there is a numb area in the back of it.  Mild pain on the left shoulder also.  No chest pain.  Took some ibuprofen for it and then became nauseous and vomited.  He does have a history of GERD. Past Medical History:  Diagnosis Date  . ADD (attention deficit disorder)   . Arthritis   . Asthma    mild seasonal reactions to mold and mildew  . Back pain   . Depression   . Fibromyalgia   . GERD (gastroesophageal reflux disease)   . Herpes simplex disease   . History of kidney stones   . Migraines   . Paresthesia of both hands   . PONV (postoperative nausea and vomiting)     Patient Active Problem List   Diagnosis Date Noted  . Gastroesophageal reflux disease   . Hiatal hernia   . Hematometra 10/02/2017  . Pelvic pain in female 10/02/2017  . Status post vaginal hysterectomy 10/02/2017  . Rectal bleeding 07/26/2017  . Rectocele, female 06/28/2017  . Uterine prolapse 06/28/2017  . Diarrhea 04/26/2017  . Chronic bilateral low back pain without sciatica 02/15/2017  . Dyspepsia 12/21/2016  . Dysphagia 12/21/2016  . Nausea without vomiting 12/21/2016  . Chronic migraine 07/20/2016  . Neck pain 07/20/2016  . 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 H/O Genital warts 08/12/2015    Past Surgical History:  Procedure Laterality Date  . ENDOMETRIAL ABLATION    . ESOPHAGEAL MANOMETRY N/A 10/15/2017   Procedure: ESOPHAGEAL MANOMETRY  (EM);  Surgeon: Napoleon FormNandigam, Kavitha V, MD;  Location: [facility];  Service: Endoscopy;  Laterality: N/A;  . ESOPHAGOGASTRODUODENOSCOPY (EGD) WITH PROPOFOL N/A 12/26/2016   Dr. Darrick PennaFields: one benign-appearing, intrinsic stenosis that was widely patent and traversed. Small hiatal hernia, few small sessile polyps. Chronic gastritis. Normal duodenum, negative celiac sprue.   Marland Kitchen. HERNIA REPAIR     inguinal as a baby   . left ovarian removal due to cyst    . PILONIDAL CYST / SINUS EXCISION  2009  . RECTOCELE REPAIR N/A 10/02/2017   Procedure: POSTERIOR REPAIR (RECTOCELE);  Surgeon: Tilda BurrowFerguson, John V, MD;  Location: [facility];  Service: Gynecology;  Laterality: N/A;  . TUBAL LIGATION    . VAGINAL HYSTERECTOMY N/A 10/02/2017   Procedure: HYSTERECTOMY VAGINAL;  Surgeon: Tilda BurrowFerguson, John V, MD;  Location: [facility];  Service: Gynecology;  Laterality: N/A;    OB History    Gravida Para Term Preterm AB Living   5 4     1 4    SAB TAB Ectopic Multiple Live Births   1               Home Medications    Prior to Admission medications   Medication Sig Start Date End Date Taking? Authorizing Provider  ADDERALL XR 20 MG 24 hr capsule Take 20 mg  by mouth every morning. 05/03/16   [provider]  metroNIDAZOLE (METROGEL) 0.75 % vaginal gel Place 1 Applicatorful vaginally at bedtime. Apply one applicatorful to vagina at bedtime twice weekly 10/18/17   Tilda Burrow, MD  naproxen (NAPROSYN) 500 MG tablet Take 500 mg by mouth daily as needed for moderate pain.     [provider]  pantoprazole (PROTONIX) 40 MG tablet Take 1 tablet (40 mg total) by mouth 2 (two) times daily before a meal. 09/27/17   Gelene Mink, NP  predniSONE (DELTASONE) 20 MG tablet Take 2 tablets (40 mg total) by mouth daily. 11/25/17   Benjiman Core, MD  promethazine (PHENERGAN) 25 MG tablet Take 1 tablet (25 mg total) by mouth every 6 (six) hours as needed for nausea or vomiting (nausea). 10/03/17   Tilda Burrow, MD  sertraline  (ZOLOFT) 50 MG tablet Take 50 mg by mouth daily.    [provider]  SUMAtriptan (IMITREX) 50 MG tablet Take by mouth.    [provider]  traMADol (ULTRAM) 50 MG tablet Take 1 tablet (50 mg total) by mouth every 6 (six) hours as needed for moderate pain or severe pain. 10/03/17   Tilda Burrow, MD    Family History Family History  Problem Relation Age of Onset  . Cancer Mother        skin  . Depression Mother   . Hypertension Mother   . Hyperlipidemia Mother   . Diabetes Mother   . Cancer Father        skin  . Arthritis Father   . Hypertension Father   . Hyperlipidemia Father   . Depression Maternal Grandmother   . Hypertension Maternal Grandmother   . Mental illness Maternal Grandmother   . Cancer Maternal Grandfather        kidney  . Hypertension Maternal Grandfather   . Kidney disease Maternal Grandfather   . Emphysema Paternal Grandmother   . Alzheimer's disease Paternal Grandmother   . Parkinson's disease Paternal Grandfather   . Colon cancer Neg Hx   . Colon polyps Neg Hx     Social History Social History   Tobacco Use  . Smoking status: Never Smoker  . Smokeless tobacco: Never Used  Substance Use Topics  . Alcohol use: No    Alcohol/week: 0.0 oz  . Drug use: No     Allergies   Dilaudid [hydromorphone hcl]; Sulfa antibiotics; Toradol [ketorolac tromethamine]; and Vicodin [hydrocodone-acetaminophen]   Review of Systems Review of Systems  Constitutional: Negative for appetite change and fever.  HENT: Negative for dental problem.   Respiratory: Negative for shortness of breath.   Cardiovascular: Negative for chest pain.  Musculoskeletal: Negative for neck pain.       Left shoulder pain  Skin: Negative for rash.  Neurological: Positive for numbness. Negative for weakness.  Psychiatric/Behavioral: Negative for confusion.     Physical Exam Updated Vital Signs BP (!) 147/98   Pulse 93   Temp 98.5 F (36.9 C) (Oral)   Resp 16    Ht 5\' 3"  (1.6 m)   Wt 79.4 kg (175 lb)   SpO2 98%   BMI 31.00 kg/m   Physical Exam  Constitutional: She appears well-developed.  HENT:  Head: Atraumatic.  Neck: Neck supple.  No cervical tenderness.  Cardiovascular: Normal rate.  Musculoskeletal:  Painless range of motion left shoulder.  No tenderness over elbow.  Good strength in shoulder elbow and wrist and hand.  Good strength in radial  median and ulnar distribution of left hand.  Does have some decreased sensation over the dorsum of the left hand near the thumb.  Neurological: She is alert.  Skin: Skin is warm. Capillary refill takes less than 2 seconds.     ED Treatments / Results  Labs (all labs ordered are listed, but only abnormal results are displayed) Labs Reviewed - No data to display  EKG  EKG Interpretation  Date/Time:  Sunday November 25 2017 07:14:19 EST Ventricular Rate:  87 PR Interval:    QRS Duration: 90 QT Interval:  370 QTC Calculation: 446 R Axis:   26 Text Interpretation:  Sinus rhythm Confirmed by Tyannah Sane ([id number]) on 11/25/2017 7:20:15 AM       Radiology No results found.  Procedures Procedures (including critical care time)  Medications Ordered in ED Medications - No data to display   Initial Impression / Assessment and Plan / ED Course  I have reviewed the triage vital signs and the nursing notes.  Pertinent labs & imaging results that were available during my care of the patient were reviewed by me and considered in my medical decision making (see chart for details).     Patient with left shoulder pain that seems to go down the arm into the hand.  Some paresthesia in the hand.  Has had cervical disc disease previously.  Rather benign exam.  Will give short course of steroids.  Follow-up with Dr. Yates as needed.  Final Clinical Impressions(s) / ED Diagnoses   Final diagnoses:  Acute pain of left shoulder  Neuropathy    ED Discharge Orders        Ordered    predniSONE  (DELTASONE) 20 MG tablet  Daily     03 /03/19 0750       Benjiman Core, MD 11/25/17 [phone]

## 2017-11-25 NOTE — ED Triage Notes (Signed)
Pt reports left arm pain since yesterday.  Pt says feels like "nerve pain" and says her arm feels heavy.  Pt took ibuprofen around 4am but vomited afterwards.  Pt says has acid reflux and has been feeling like she has heart burn.

## 2017-11-25 NOTE — ED Notes (Signed)
ED Provider at bedside. 

## 2017-11-25 NOTE — Discharge Instructions (Signed)
Your pain could be coming from nerve irritation.  Take the steroids for the next 3 days.  Follow-up with Dr. Ophelia CharterYates as needed.

## 2017-12-06 ENCOUNTER — Encounter: Payer: Self-pay | Admitting: Gastroenterology

## 2017-12-06 ENCOUNTER — Ambulatory Visit: Payer: Medicaid Other | Admitting: Gastroenterology

## 2017-12-06 VITALS — BP 128/88 | HR 76 | Temp 98.6°F | Ht 63.0 in | Wt 179.8 lb

## 2017-12-06 DIAGNOSIS — K219 Gastro-esophageal reflux disease without esophagitis: Secondary | ICD-10-CM | POA: Diagnosis not present

## 2017-12-06 MED ORDER — PANTOPRAZOLE SODIUM 40 MG PO TBEC: 40.0000 mg | DELAYED_RELEASE_TABLET | Freq: Two times a day (BID) | ORAL | 3 refills | Status: DC

## 2017-12-06 NOTE — Patient Instructions (Signed)
Continue with Protonix twice a day.   Call me if any evidence of bleeding or other concerns.  We will see you in 6-8 months! Have a great upcoming wedding season! :)   It was a pleasure to see you today. I strive to create trusting relationships with patients to provide genuine, compassionate, and quality care. I value your feedback. If you receive a survey regarding your visit,  I greatly appreciate you taking time to fill this out.   Gelene MinkAnna W. Chantay Whitelock, PhD, ANP-BC [facility]

## 2017-12-06 NOTE — Progress Notes (Signed)
Primary Care Physician:  Selinda Flavin, MD Primary GI: Dr. Darrick Penna   Chief Complaint  Patient presents with  . Dysphagia    HPI:   Ruth Shah is a 42 y.o. female presenting today with a history of GERD, dysphagia, abdominal pain, diarrhea. Recent EGD with one benign-appearing, intrinsic stenosis that was widely patent and traversed. Small hiatal hernia, few small sessile polyps. Chronic gastritis. Normal duodenum, negative celiac sprue. [place] March 2018 at [facility] with normal gallbladder. HIDA scan normal. Recommendations to consider tapering off cymbaltaand try Paxil. Noted scant blood once or twice in remote past. Discussed colonoscopy at last visit. BPE with small partial Schatzki's ring without tablet obstruction. Esophageal dysmotility noted. Underwent hysterectomy in interim from last visit.  Manometry also completed in interim due to dysphagia. This was normal.    Sometimes forgets to take Protonix. Prescribed BID. Only has an issue with bread, otherwise doing well. No diarrhea unless eats something greasy, [place] food. No abdominal pain. Both her son and daughter are getting married in September. Wants to hold off on colonoscopy. Has only seen scant amount of blood on 2 occasions in the remote past.    Past Medical History:  Diagnosis Date  . ADD (attention deficit disorder)   . Arthritis   . Asthma    mild seasonal reactions to mold and mildew  . Back pain   . Depression   . Fibromyalgia   . GERD (gastroesophageal reflux disease)   . Herpes simplex disease   . History of kidney stones   . Migraines   . Paresthesia of both hands   . PONV (postoperative nausea and vomiting)     Past Surgical History:  Procedure Laterality Date  . ENDOMETRIAL ABLATION    . ESOPHAGEAL MANOMETRY N/A 10/15/2017   Procedure: ESOPHAGEAL MANOMETRY (EM);  Surgeon: Napoleon Form, MD;  Location: [facility];  Service: Endoscopy;  Laterality: N/A;  . ESOPHAGOGASTRODUODENOSCOPY (EGD)  WITH PROPOFOL N/A 12/26/2016   Dr. Darrick Penna: one benign-appearing, intrinsic stenosis that was widely patent and traversed. Small hiatal hernia, few small sessile polyps. Chronic gastritis. Normal duodenum, negative celiac sprue.   Marland Kitchen HERNIA REPAIR     inguinal as a baby   . left ovarian removal due to cyst    . PILONIDAL CYST / SINUS EXCISION  2009  . RECTOCELE REPAIR N/A 10/02/2017   Procedure: POSTERIOR REPAIR (RECTOCELE);  Surgeon: Tilda Burrow, MD;  Location: [facility];  Service: Gynecology;  Laterality: N/A;  . TUBAL LIGATION    . VAGINAL HYSTERECTOMY N/A 10/02/2017   Procedure: HYSTERECTOMY VAGINAL;  Surgeon: Tilda Burrow, MD;  Location: [facility];  Service: Gynecology;  Laterality: N/A;    Current Outpatient Medications  Medication Sig Dispense Refill  . ADDERALL XR 20 MG 24 hr capsule Take 20 mg by mouth every morning.  0  . naproxen (NAPROSYN) 500 MG tablet Take 500 mg by mouth daily as needed for moderate pain.     . pantoprazole (PROTONIX) 40 MG tablet Take 1 tablet (40 mg total) by mouth 2 (two) times daily before a meal. 180 tablet 3  . promethazine (PHENERGAN) 25 MG tablet Take 1 tablet (25 mg total) by mouth every 6 (six) hours as needed for nausea or vomiting (nausea). 30 tablet 0  . sertraline (ZOLOFT) 50 MG tablet Take 50 mg by mouth daily.    . SUMAtriptan (IMITREX) 50 MG tablet Take by mouth as needed.     . topiramate (TOPAMAX) 100 MG  tablet Take 100 mg by mouth daily.    . traMADol (ULTRAM) 50 MG tablet Take 1 tablet (50 mg total) by mouth every 6 (six) hours as needed for moderate pain or severe pain. 30 tablet 0  . metroNIDAZOLE (METROGEL) 0.75 % vaginal gel Place 1 Applicatorful vaginally at bedtime. Apply one applicatorful to vagina at bedtime twice weekly (Patient not taking: Reported on 12/06/2017) 70 g 1  . predniSONE (DELTASONE) 20 MG tablet Take 2 tablets (40 mg total) by mouth daily. (Patient not taking: Reported on 12/06/2017) 6 tablet 0   No current  facility-administered medications for this visit.     Allergies as of 12/06/2017 - Review Complete 12/06/2017  Allergen Reaction Noted  . Dilaudid [hydromorphone hcl] Nausea And Vomiting 10/10/2017  . Sulfa antibiotics Nausea Only 07/20/2016  . Toradol [ketorolac tromethamine] Nausea Only 07/20/2016  . Vicodin [hydrocodone-acetaminophen] Itching 08/12/2015    Family History  Problem Relation Age of Onset  . Cancer Mother        skin  . Depression Mother   . Hypertension Mother   . Hyperlipidemia Mother   . Diabetes Mother   . Cancer Father        skin  . Arthritis Father   . Hypertension Father   . Hyperlipidemia Father   . Depression Maternal Grandmother   . Hypertension Maternal Grandmother   . Mental illness Maternal Grandmother   . Cancer Maternal Grandfather        kidney  . Hypertension Maternal Grandfather   . Kidney disease Maternal Grandfather   . Emphysema Paternal Grandmother   . Alzheimer's disease Paternal Grandmother   . Parkinson's disease Paternal Grandfather   . Colon cancer Neg Hx   . Colon polyps Neg Hx     Social History   Socioeconomic History  . Marital status: Married    Spouse name: None  . Number of children: 4  . Years of education: Some College  . Highest education level: None  Social Needs  . Financial resource strain: None  . Food insecurity - worry: None  . Food insecurity - inability: None  . Transportation needs - medical: None  . Transportation needs - non-medical: None  Occupational History  . None  Tobacco Use  . Smoking status: Never Smoker  . Smokeless tobacco: Never Used  Substance and Sexual Activity  . Alcohol use: No    Alcohol/week: 0.0 oz  . Drug use: No  . Sexual activity: Not Currently    Birth control/protection: Surgical    Comment: tubal  Other Topics Concern  . None  Social History Narrative   Lives at home with husband and children.   Right-handed.   4 cups caffeine per week.    Review of  Systems: Gen: Denies fever, chills, anorexia. Denies fatigue, weakness, weight loss.  CV: Denies chest pain, palpitations, syncope, peripheral edema, and claudication. Resp: Denies dyspnea at rest, cough, wheezing, coughing up blood, and pleurisy. GI: see HPI  Derm: Denies rash, itching, dry skin Psych: Denies depression, anxiety, memory loss, confusion. No homicidal or suicidal ideation.  Heme: Denies bruising, bleeding, and enlarged lymph nodes.  Physical Exam: BP 128/88   Pulse 76   Temp 98.6 F (37 C) (Oral)   Ht 5\' 3"  (1.6 m)   Wt 179 lb 12.8 oz (81.6 kg)   BMI 31.85 kg/m  General:   Alert and oriented. No distress noted. Pleasant and cooperative.  Head:  Normocephalic and atraumatic. Eyes:  Conjuctiva clear without scleral  icterus. Mouth:  Oral mucosa pink and moist. Abdomen:  +BS, soft, non-tender and non-distended. No rebound or guarding. No HSM or masses noted. Msk:  Symmetrical without gross deformities. Normal posture. Extremities:  Without edema. Neurologic:  Alert and  oriented x4 Psych:  Alert and cooperative. Normal mood and affect.

## 2017-12-06 NOTE — Progress Notes (Signed)
cc'ed to pcp °

## 2017-12-06 NOTE — Assessment & Plan Note (Signed)
Doing well with Protonix BID. Noted dysphagia with bread, otherwise no issues. EGD, BPE, and manometry on file. Return in 6-8 months. Refills for Protonix provided.

## 2017-12-11 ENCOUNTER — Ambulatory Visit (INDEPENDENT_AMBULATORY_CARE_PROVIDER_SITE_OTHER): Payer: Medicaid Other | Admitting: Orthopaedic Surgery

## 2017-12-11 ENCOUNTER — Ambulatory Visit (INDEPENDENT_AMBULATORY_CARE_PROVIDER_SITE_OTHER): Payer: Medicaid Other

## 2017-12-11 ENCOUNTER — Encounter (INDEPENDENT_AMBULATORY_CARE_PROVIDER_SITE_OTHER): Payer: Self-pay | Admitting: Orthopaedic Surgery

## 2017-12-11 VITALS — BP 167/113 | HR 83 | Ht 63.0 in | Wt 180.0 lb

## 2017-12-11 DIAGNOSIS — M545 Low back pain: Secondary | ICD-10-CM

## 2017-12-11 DIAGNOSIS — M542 Cervicalgia: Secondary | ICD-10-CM

## 2017-12-11 DIAGNOSIS — G8929 Other chronic pain: Secondary | ICD-10-CM

## 2017-12-11 NOTE — Progress Notes (Signed)
Office Visit Note   Patient: Ruth Shah           Date of Birth: 1976-05-01           MRN: [id number] Visit Date: 12/11/2017              Requested by: Selinda Flavin, MD [address] [place], [place] [address] PCP: Selinda Flavin, MD   Assessment & Plan: Visit Diagnoses:  1. Neck pain   2. Chronic bilateral low back pain, with sciatica presence unspecified     Plan: Due to progression of patient's symptoms with worse neck than low back pain we will proceed with cervical MRI scan to evaluate her for her symptoms worse on the left than right with some spurring and disc space narrowing at C5-6.  Previous MRI a few years ago showed foraminal narrowing at C6-7 with some progression by plain radiographs.  She will seek treatment for her hypertension which is not controlled. Follow-Up Instructions: No Follow-up on file.   Orders:  Orders Placed This Encounter  Procedures  . XR Cervical Spine 2 or 3 views  . XR Lumbar Spine 2-3 Views   No orders of the defined types were placed in this encounter.     Procedures: No procedures performed   Clinical Data: No additional findings.   Subjective: Chief Complaint  Patient presents with  . Neck - Pain  . Lower Back - Pain    HPI 42 year old female seen with problems with left arm pain with tingling in her hand.  She also has low back pain.  She was seen in [facility] emergency room 11/25/2017 given prednisone pack.  Previous MRI cervical spine 2017 showed disc degeneration C5-6 and C6-7 with mild canal stenosis at C6-7.  Moderate foraminal narrowing and cord flattening noted.  Previous lumbar epidural did not give her sustained relief.  Dates her symptoms are getting worse.  Blood pressure is elevated today at 167/113 which was discussed with her she needs to address promptly.  We reviewed her 2017 cervical MRI scan today as well as her previous lumbar MRI that showed slight disc protrusion with disc desiccation at L5-S1.  Review of Systems  positive for shallow lumbar protrusion L5-S1 without compression.  Cervical spondylosis, depression.  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  Hypertension, not controlled.  Previous vaginal hysterectomy Jan. 2019   Objective: Vital Signs: BP (!) 167/113   Pulse 83   Ht 5\' 3"  (1.6 m)   Wt 180 lb (81.6 kg)   BMI 31.89 kg/m   Physical Exam  Constitutional: She is oriented to person, place, and time. She appears well-developed.  HENT:  Head: Normocephalic.  Right Ear: External ear normal.  Left Ear: External ear normal.  Eyes: Pupils are equal, round, and reactive to light.  Neck: No tracheal deviation present. No thyromegaly present.  Cardiovascular: Normal rate.  Pulmonary/Chest: Effort normal.  Abdominal: Soft.  Neurological: She is alert and oriented to person, place, and time.  Skin: Skin is warm and dry.  Psychiatric: She has a normal mood and affect. Her behavior is normal.    Ortho Exam patient has bilateral brachial plexus tenderness.  Negative Spurling.  Pain with cervical extension.  Upper extremity reflexes are 2+ and symmetrical.  Negative logroll to the hips.  Knee and ankle jerk are intact distal pulses are intact. Specialty Comments:  No specialty comments available.  Imaging: No results found.   PMFS History: Patient Active Problem List   Diagnosis  Date Noted  . Gastroesophageal reflux disease   . Hiatal hernia   . Hematometra 10/02/2017  . Pelvic pain in female 10/02/2017  . Status post vaginal hysterectomy 10/02/2017  . Rectal bleeding 07/26/2017  . Rectocele, female 06/28/2017  . Uterine prolapse 06/28/2017  . Diarrhea 04/26/2017  . Chronic bilateral low back pain without sciatica 02/15/2017  . Dyspepsia 12/21/2016  . Dysphagia 12/21/2016  . Nausea without vomiting 12/21/2016  . Chronic migraine 07/20/2016  . Neck pain 07/20/2016  . 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 H/O Genital warts 08/12/2015   Past Medical History:  Diagnosis Date  . ADD  (attention deficit disorder)   . Arthritis   . Asthma    mild seasonal reactions to mold and mildew  . Back pain   . Depression   . Fibromyalgia   . GERD (gastroesophageal reflux disease)   . Herpes simplex disease   . History of kidney stones   . Migraines   . Paresthesia of both hands   . PONV (postoperative nausea and vomiting)     Family History  Problem Relation Age of Onset  . Cancer Mother        skin  . Depression Mother   . Hypertension Mother   . Hyperlipidemia Mother   . Diabetes Mother   . Cancer Father        skin  . Arthritis Father   . Hypertension Father   . Hyperlipidemia Father   . Depression Maternal Grandmother   . Hypertension Maternal Grandmother   . Mental illness Maternal Grandmother   . Cancer Maternal Grandfather        kidney  . Hypertension Maternal Grandfather   . Kidney disease Maternal Grandfather   . Emphysema Paternal Grandmother   . Alzheimer's disease Paternal Grandmother   . Parkinson's disease Paternal Grandfather   . Colon cancer Neg Hx   . Colon polyps Neg Hx     Past Surgical History:  Procedure Laterality Date  . ENDOMETRIAL ABLATION    . ESOPHAGEAL MANOMETRY N/A 10/15/2017   Procedure: ESOPHAGEAL MANOMETRY (EM);  Surgeon: Napoleon FormNandigam, Kavitha V, MD;  Location: [facility];  Service: Endoscopy;  Laterality: N/A;  . ESOPHAGOGASTRODUODENOSCOPY (EGD) WITH PROPOFOL N/A 12/26/2016   Dr. Darrick PennaFields: one benign-appearing, intrinsic stenosis that was widely patent and traversed. Small hiatal hernia, few small sessile polyps. Chronic gastritis. Normal duodenum, negative celiac sprue.   Marland Kitchen. HERNIA REPAIR     inguinal as a baby   . left ovarian removal due to cyst    . PILONIDAL CYST / SINUS EXCISION  2009  . RECTOCELE REPAIR N/A 10/02/2017   Procedure: POSTERIOR REPAIR (RECTOCELE);  Surgeon: Tilda BurrowFerguson, John V, MD;  Location: [facility];  Service: Gynecology;  Laterality: N/A;  . TUBAL LIGATION    . VAGINAL HYSTERECTOMY N/A 10/02/2017   Procedure:  HYSTERECTOMY VAGINAL;  Surgeon: Tilda BurrowFerguson, John V, MD;  Location: [facility];  Service: Gynecology;  Laterality: N/A;   Social History   Occupational History  . Not on file  Tobacco Use  . Smoking status: Never Smoker  . Smokeless tobacco: Never Used  Substance and Sexual Activity  . Alcohol use: No    Alcohol/week: 0.0 oz  . Drug use: No  . Sexual activity: Not Currently    Birth control/protection: Surgical    Comment: tubal

## 2017-12-17 ENCOUNTER — Encounter (INDEPENDENT_AMBULATORY_CARE_PROVIDER_SITE_OTHER): Payer: Self-pay | Admitting: Orthopaedic Surgery

## 2017-12-25 ENCOUNTER — Ambulatory Visit
Admission: RE | Admit: 2017-12-25 | Discharge: 2017-12-25 | Disposition: A | Discharge status: Home / Self Care | Payer: Medicaid Other | Source: Ambulatory Visit | Attending: Orthopaedic Surgery | Admitting: Orthopaedic Surgery

## 2017-12-25 DIAGNOSIS — M542 Cervicalgia: Secondary | ICD-10-CM

## 2017-12-26 ENCOUNTER — Telehealth (INDEPENDENT_AMBULATORY_CARE_PROVIDER_SITE_OTHER): Payer: Self-pay | Admitting: Orthopaedic Surgery

## 2017-12-26 NOTE — Telephone Encounter (Signed)
Patient had her MRI recently and she doesn't have an appointment with Dr. Ophelia CharterYates until the 23rd. She has a lump on her neck that just formed over the last few days that's really painful and she wanted to know if she could get her MRI results over the phone. [phone] # [phone]

## 2017-12-27 NOTE — Telephone Encounter (Signed)
I called and spoke with patient. She has a swollen knot in the back of her neck. She states that the pain has gotten a little better since last night. She was taking Tylenol Arthritis yesterday, three at a time with relief.  I advised Dr. Ophelia CharterYates is out of the office until tomorrow and I would send message to him to advise in regards to MRI report.

## 2017-12-28 NOTE — Telephone Encounter (Signed)
I called discussed. She will keep appt for review of images.  FYI

## 2017-12-28 NOTE — Telephone Encounter (Signed)
noted 

## 2018-01-15 ENCOUNTER — Ambulatory Visit (INDEPENDENT_AMBULATORY_CARE_PROVIDER_SITE_OTHER): Payer: Medicaid Other | Admitting: Orthopaedic Surgery

## 2018-01-15 ENCOUNTER — Encounter (INDEPENDENT_AMBULATORY_CARE_PROVIDER_SITE_OTHER): Payer: Self-pay | Admitting: Orthopaedic Surgery

## 2018-01-15 VITALS — BP 129/82 | HR 76 | Ht 63.0 in | Wt 180.0 lb

## 2018-01-15 DIAGNOSIS — M5030 Other cervical disc degeneration, unspecified cervical region: Secondary | ICD-10-CM | POA: Diagnosis not present

## 2018-01-15 DIAGNOSIS — M9981 Other biomechanical lesions of cervical region: Secondary | ICD-10-CM

## 2018-01-15 DIAGNOSIS — M4802 Spinal stenosis, cervical region: Secondary | ICD-10-CM

## 2018-01-15 NOTE — Progress Notes (Signed)
Office Visit Note   Patient: Ruth Shah           Date of Birth: [birth year]           MRN: [id number] Visit Date: 01/15/2018              Requested by: Selinda Flavin, MD [address] [place], [place] [address] PCP: Selinda Flavin, MD   Assessment & Plan: Visit Diagnoses:  1. Foraminal stenosis of cervical region   2. Other cervical disc degeneration, unspecified cervical region     Plan: We reviewed the MRI scan.  C6-7 level has more narrowing with mild right moderate left foraminal stenosis.  There is endplate edema changes.  No changes in the cord were noted.  We discussed potential surgical options and will continue to follow her and recheck her again in 4 months.  He gets increased symptoms she will let us know.  Comparison to previous MRI scan was performed and I gave her a copy of her current report.  Follow-Up Instructions: Return in about 4 months (around 05/17/2018).   Orders:  No orders of the defined types were placed in this encounter.  No orders of the defined types were placed in this encounter.     Procedures: No procedures performed   Clinical Data: No additional findings.   Subjective: Chief Complaint  Patient presents with  . Lower Back - Follow-up  . Results    MRI    HPI 42 year old female returns with ongoing problems with neck pain left arm pain.  She also has had problems with some low back pain.  She is been on the prednisone Dosepak.  This was given in the emergency room 11/25/2017.  Previous MRI scan 2017 showed spondylosis with disc degeneration at C5-6 and C6-7 with mild canal stenosis at C6-7.  Has had problems with elevated blood pressure.  Review of Systems 14 point systems updated unchanged from 12/11/2017 other than as mentioned above.   Objective: Vital Signs: BP 129/82   Pulse 76   Ht 5\' 3"  (1.6 m)   Wt 180 lb (81.6 kg)   BMI 31.89 kg/m   Physical Exam  Constitutional: She is oriented to person, place, and time. She appears  well-developed.  HENT:  Head: Normocephalic.  Right Ear: External ear normal.  Left Ear: External ear normal.  Eyes: Pupils are equal, round, and reactive to light.  Neck: No tracheal deviation present. No thyromegaly present.  Cardiovascular: Normal rate.  Pulmonary/Chest: Effort normal.  Abdominal: Soft.  Neurological: She is alert and oriented to person, place, and time.  Skin: Skin is warm and dry.  Psychiatric: She has a normal mood and affect. Her behavior is normal.    Ortho Exam patient has some bilateral brachial plexus tenderness.  Negative Spurling.  Some discomfort with cervical extension.  Reflexes are 2+ and symmetrical normal heel toe gait.  Negative straight leg raising 90 degrees.  Lower extremities show no hyperreflexia.  No clonus.  Specialty Comments:  No specialty comments available.  Imaging: CLINICAL DATA:  42 year old female with chronic neck pain. Swollen area along the posterior neck. No known injury. Numbness in all extremities.  EXAM: MRI CERVICAL SPINE WITHOUT CONTRAST  TECHNIQUE: Multiplanar, multisequence MR imaging of the cervical spine was performed. No intravenous contrast was administered.  COMPARISON:  Cervical spine radiographs 12/11/2017. Cervical spine MRI 06/29/2016. Brain MRI 10/19/2009.  FINDINGS: Alignment: Chronic straightening of cervical lordosis, stable since 2017. No spondylolisthesis.  Vertebrae: Stable visible bone  marrow signal aside from faint degenerative appearing endplate marrow edema at C6-C7, both to the left of midline (series 8, image 11) and laterally on the right (image 7). No other marrow edema or acute osseous abnormality identified.  Cord: Spinal cord signal is within normal limits at all visualized levels.  Posterior Fossa, vertebral arteries, paraspinal tissues: Negative visible brain parenchyma. Preserved major vascular flow voids in the neck. The left vertebral artery may be mildly dominant.  Negative neck soft tissues. Negative visible lung apices.  Disc levels:  C2-C3:  Negative.  C3-C4: Minimal disc bulge. Borderline to mild facet hypertrophy. Borderline to mild right C4 foraminal stenosis is stable.  C4-C5: Minimal disc bulge. Right foraminal endplate spurring. Borderline to mild facet hypertrophy. Borderline to mild right C5 foraminal stenosis is stable.  C5-C6: Mild circumferential disc bulge and endplate spurring. Left greater than right foraminal involvement. Borderline to mild left C6 foraminal stenosis. This level is stable.  C6-C7: Circumferential disc bulge and endplate spurring with broad-based posterior component slightly eccentric to the right. Mild spinal stenosis with no cord mass effect. Mild left and mild to moderate right C7 foraminal stenosis. This level is stable.  C7-T1:  Mild left facet hypertrophy.  No stenosis.  Negative visible upper thoracic levels.  IMPRESSION: 1. Stable MRI appearance of the cervical spine since 2017. 2. Mild disc and endplate degeneration in the lower cervical spine maximal at C6-C7 where there is mild degenerative appearing endplate marrow edema. Associated mild spinal stenosis at that level with no cord mass effect, mild left and mild to moderate right C7 neural foraminal stenosis. 3. Borderline to mild cervical neural foraminal stenosis elsewhere.   Electronically Signed   By: Odessa FlemingH  Hall M.D.   On: 12/25/2017 09:59   PMFS History: Patient Active Problem List   Diagnosis Date Noted  . Gastroesophageal reflux disease   . Hiatal hernia   . Hematometra 10/02/2017  . Pelvic pain in female 10/02/2017  . Status post vaginal hysterectomy 10/02/2017  . Rectal bleeding 07/26/2017  . Rectocele, female 06/28/2017  . Uterine prolapse 06/28/2017  . Diarrhea 04/26/2017  . Chronic bilateral low back pain without sciatica 02/15/2017  . Dyspepsia 12/21/2016  . Dysphagia 12/21/2016  . Nausea without vomiting  12/21/2016  . Chronic migraine 07/20/2016  . Neck pain 07/20/2016  . 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 H/O Genital warts 08/12/2015   Past Medical History:  Diagnosis Date  . ADD (attention deficit disorder)   . Arthritis   . Asthma    mild seasonal reactions to mold and mildew  . Back pain   . Depression   . Fibromyalgia   . GERD (gastroesophageal reflux disease)   . Herpes simplex disease   . History of kidney stones   . Migraines   . Paresthesia of both hands   . PONV (postoperative nausea and vomiting)     Family History  Problem Relation Age of Onset  . Cancer Mother        skin  . Depression Mother   . Hypertension Mother   . Hyperlipidemia Mother   . Diabetes Mother   . Cancer Father        skin  . Arthritis Father   . Hypertension Father   . Hyperlipidemia Father   . Depression Maternal Grandmother   . Hypertension Maternal Grandmother   . Mental illness Maternal Grandmother   . Cancer Maternal Grandfather        kidney  . Hypertension Maternal Grandfather   .  Kidney disease Maternal Grandfather   . Emphysema Paternal Grandmother   . Alzheimer's disease Paternal Grandmother   . Parkinson's disease Paternal Grandfather   . Colon cancer Neg Hx   . Colon polyps Neg Hx     Past Surgical History:  Procedure Laterality Date  . ENDOMETRIAL ABLATION    . ESOPHAGEAL MANOMETRY N/A 10/15/2017   Procedure: ESOPHAGEAL MANOMETRY (EM);  Surgeon: Napoleon Form, MD;  Location: [facility];  Service: Endoscopy;  Laterality: N/A;  . ESOPHAGOGASTRODUODENOSCOPY (EGD) WITH PROPOFOL N/A 12/26/2016   Dr. Darrick Penna: one benign-appearing, intrinsic stenosis that was widely patent and traversed. Small hiatal hernia, few small sessile polyps. Chronic gastritis. Normal duodenum, negative celiac sprue.   Marland Kitchen HERNIA REPAIR     inguinal as a baby   . left ovarian removal due to cyst    . PILONIDAL CYST / SINUS EXCISION  2009  . RECTOCELE REPAIR N/A 10/02/2017   Procedure: POSTERIOR  REPAIR (RECTOCELE);  Surgeon: Tilda Burrow, MD;  Location: [facility];  Service: Gynecology;  Laterality: N/A;  . TUBAL LIGATION    . VAGINAL HYSTERECTOMY N/A 10/02/2017   Procedure: HYSTERECTOMY VAGINAL;  Surgeon: Tilda Burrow, MD;  Location: [facility];  Service: Gynecology;  Laterality: N/A;   Social History   Occupational History  . Not on file  Tobacco Use  . Smoking status: Never Smoker  . Smokeless tobacco: Never Used  Substance and Sexual Activity  . Alcohol use: No    Alcohol/week: 0.0 oz  . Drug use: No  . Sexual activity: Not Currently    Birth control/protection: Surgical    Comment: tubal

## 2018-02-01 ENCOUNTER — Encounter (INDEPENDENT_AMBULATORY_CARE_PROVIDER_SITE_OTHER): Payer: Self-pay | Admitting: Orthopaedic Surgery

## 2018-02-26 ENCOUNTER — Ambulatory Visit (INDEPENDENT_AMBULATORY_CARE_PROVIDER_SITE_OTHER): Payer: Medicaid Other | Admitting: Allergy & Immunology

## 2018-02-26 ENCOUNTER — Encounter: Payer: Self-pay | Admitting: Allergy & Immunology

## 2018-02-26 VITALS — BP 130/90 | HR 71 | Temp 98.8°F | Resp 22 | Ht 62.5 in | Wt 178.6 lb

## 2018-02-26 DIAGNOSIS — L500 Allergic urticaria: Secondary | ICD-10-CM

## 2018-02-26 DIAGNOSIS — T781XXD Other adverse food reactions, not elsewhere classified, subsequent encounter: Secondary | ICD-10-CM | POA: Diagnosis not present

## 2018-02-26 MED ORDER — PREDNISONE 10 MG PO TABS: ORAL_TABLET | ORAL | 0 refills | Status: DC

## 2018-02-26 MED ORDER — CETIRIZINE HCL 10 MG PO TABS: 10.0000 mg | ORAL_TABLET | Freq: Two times a day (BID) | ORAL | 5 refills | Status: DC

## 2018-02-26 MED ORDER — MONTELUKAST SODIUM 10 MG PO TABS: 10.0000 mg | ORAL_TABLET | Freq: Every day | ORAL | 5 refills | Status: DC

## 2018-02-26 MED ORDER — EPINEPHRINE 0.3 MG/0.3ML IJ SOAJ: 0.3000 mg | Freq: Once | INTRAMUSCULAR | 2 refills | Status: AC

## 2018-02-26 NOTE — Progress Notes (Signed)
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 Ruth Shah is a 42 y.o. female presenting with acute onset of urticaria as well as some shortness of breath. In fact, we fit her in early since she was having some breathing difficulties during her son's visit with me. The trigger of these symptoms is not clear, but what is known is that she had a multitude of tick bites around two weeks before the onset of the symptoms. Once the hives appeared, she had blood work performed including an alpha gal panel that demonstrated an IgE to alpha gal that was barely positive. I am not convinced that this is the underlying cause of the patient's clinical presentation, but I did recommend continuing to avoid it for now. Typically this syndrome takes 6-8 weeks after the bite to develop and symptoms resolve within 1-2 days of stopping red meat. She - at the point that I saw her - had not eaten any red meat in 4 days and continues to have fairly impressive symptoms. We did get an extensive lab work up to figure this out, and in the meantime we placed her on a prolonged course of prednisone and a suppressive dosing regimen of antihistamines. We will reconvene after the labs return to figure out a future course. We also reviewed the use of epinephrine and the indications for using it.   Plan/Recommendations:   1. Allergic urticaria -I am not convinced that your rash is from alpha gal sensitivity, since your labs were not that impressive. - We will start a prednisone taper: take 3 tabs (64m) twice daily for 3 days, then 2 tabs (263m twice daily for 3 days, then 1 tab (1027mtwice daily for 3 days, then STOP. - Your history does not have any "red flags" such as fevers, joint pains, or permanent skin changes that would be concerning for a more  serious cause of hives.  - We will get some labs to rule out serious causes of hives: tryptase level, CMP, ESR, and CRP. - Chronic hives are often times a self limited process and will "burn themselves out" over 6-12 months, although this is not always the case.  - In the meantime, start suppressive dosing of antihistamines:   - Morning: Zyrtec (cetirizine) 10-95m65mne or twotablets)  - Evening: Zyrtec (cetirizine) 10-95mg48me or two tablets) + Singulair (montelukast) 10mg 14mu can change this dosing at home, decreasing the dose as needed or increasing the dosing as needed.  - We may consider patch testing in the future if there is no improvement.  2. Adverse food reaction -EpiPen prescription provided. -Anaphylaxis management plan provided. -We may consider additional food testing in the future.   3. Follow up in one month.    Subjective:   Ruth KEYONA [id number] y.o13female presenting today for evaluation of  Chief Complaint  Patient presents with  . Allergic Rhinitis     Nitya W MorrisLippe history of the following: Patient Active Problem List   Diagnosis Date Noted  . Gastroesophageal reflux disease   . Hiatal hernia   . Hematometra 10/02/2017  . Pelvic pain in female 10/02/2017  . Status post vaginal hysterectomy 10/02/2017  . Rectal bleeding 07/26/2017  . Rectocele, female 06/28/2017  . Uterine prolapse 06/28/2017  . Diarrhea  04/26/2017  . Chronic bilateral low back pain without sciatica 02/15/2017  . Dyspepsia 12/21/2016  . Dysphagia 12/21/2016  . Nausea without vomiting 12/21/2016  . Chronic migraine 07/20/2016  . Neck pain 07/20/2016  . 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 H/O Genital warts 08/12/2015    History obtained from: chart review and patient.  Cricket W Mehta was referred by Rory Percy, MD.     Ruth Shah is a 42 y.o. female presenting for an evaluation of alpha gal allergy. She had a series of tick bites one month ago. She picked 20-30 off of her. She  was getting her goat out of the brush at the time.  Then the hives popped up around one week after this. She went to see her PCP, and she was started on doxycyline for presumed Lyme disease. The doxy did improve her symptoms.   Two weeks later on May 19th, she went to take care of someone's horse. She thought she had gotten into some poison oak and started itching. Two days later, she went to see someone at her PCP's office ([facility]) who did not feel that it was poison oak. This was not felt to be related to the recent completion of the doxy either since she was no longer on the medication. Alpha gal panel was sent and it was "high positive", per the patient. However review of her labs shows that it was only 0.11 (with positive being anything > 0.10). A CBC was normal as was a CMP. Prior to this, she never had any kind of hives and was never allergic to anything in particular. She had no symptoms of allergic rhinitis whatsoever.    She did receive one steroid injection, which did not seem to help much at all aside from one hour following the injection. She was never placed on a prednisone taper. The last red meat that she had was on May 26th. She did have an accidental exposure when she ate "beans and franks", and she thought it had chicken but clearly did not. She does not have an EpiPen prescribed at this time.    Review of her exposure history shows that she does not use cosmetics. She does use Dial soap and has been using the same laundry detergent for ages. There are no new exposures at all.    Otherwise, there is no history of other atopic diseases, including asthma, drug allergies, stinging insect allergies, or urticaria. There is no significant infectious history. Vaccinations are up to date.    Past Medical History: Patient Active Problem List   Diagnosis Date Noted  . Gastroesophageal reflux disease   . Hiatal hernia   . Hematometra 10/02/2017  . Pelvic pain in female  10/02/2017  . Status post vaginal hysterectomy 10/02/2017  . Rectal bleeding 07/26/2017  . Rectocele, female 06/28/2017  . Uterine prolapse 06/28/2017  . Diarrhea 04/26/2017  . Chronic bilateral low back pain without sciatica 02/15/2017  . Dyspepsia 12/21/2016  . Dysphagia 12/21/2016  . Nausea without vomiting 12/21/2016  . Chronic migraine 07/20/2016  . Neck pain 07/20/2016  . 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 H/O Genital warts 08/12/2015    Medication List:  Allergies as of 02/26/2018      Reactions   Dilaudid [hydromorphone Hcl] Nausea And Vomiting   Sulfa Antibiotics Nausea Only   Toradol [ketorolac Tromethamine] Nausea Only   Vicodin [hydrocodone-acetaminophen] Itching      Medication List        Accurate as of 02/26/18  1:52 PM. Always use your most recent med list.          cetirizine 10 MG tablet Commonly known as:  ZYRTEC Take 1-2 tablets (10-20 mg total) by mouth 2 (two) times daily.   EPINEPHrine 0.3 mg/0.3 mL Soaj injection Commonly known as:  EPIPEN 2-PAK Inject 0.3 mLs (0.3 mg total) into the muscle once for 1 dose.   montelukast 10 MG tablet Commonly known as:  SINGULAIR Take 1 tablet (10 mg total) by mouth at bedtime.   pantoprazole 40 MG tablet Commonly known as:  PROTONIX Take 1 tablet (40 mg total) by mouth 2 (two) times daily before a meal.   predniSONE 10 MG tablet Commonly known as:  DELTASONE take 3 tabs (35m) twice daily for 3 days, then 2 tabs (271m twice daily for 3 days, then 1 tab (1037mtwice daily for 3 days, then STOP.   propranolol ER 60 MG 24 hr capsule Commonly known as:  INDERAL LA Take 60 mg by mouth at bedtime.   sertraline 50 MG tablet Commonly known as:  ZOLOFT Take 50 mg by mouth daily.   SUMAtriptan 50 MG tablet Commonly known as:  IMITREX Take by mouth as needed.   topiramate 100 MG tablet Commonly known as:  TOPAMAX Take 100 mg by mouth daily.       Birth History: non-contributory.   Developmental History:  non-contributory.   Past Surgical History: Past Surgical History:  Procedure Laterality Date  . ENDOMETRIAL ABLATION    . ESOPHAGEAL MANOMETRY N/A 10/15/2017   Procedure: ESOPHAGEAL MANOMETRY (EM);  Surgeon: NanMauri PoleD;  Location: [facility];  Service: Endoscopy;  Laterality: N/A;  . ESOPHAGOGASTRODUODENOSCOPY (EGD) WITH PROPOFOL N/A 12/26/2016   Dr. FieOneida Alarne benign-appearing, intrinsic stenosis that was widely patent and traversed. Small hiatal hernia, few small sessile polyps. Chronic gastritis. Normal duodenum, negative celiac sprue.   . HMarland KitchenRNIA REPAIR     inguinal as a baby   . left ovarian removal due to cyst    . PILONIDAL CYST / SINUS EXCISION  2009  . RECTOCELE REPAIR N/A 10/02/2017   Procedure: POSTERIOR REPAIR (RECTOCELE);  Surgeon: FerJonnie KindD;  Location: [facility];  Service: Gynecology;  Laterality: N/A;  . SINOSCOPY    . TUBAL LIGATION    . VAGINAL HYSTERECTOMY N/A 10/02/2017   Procedure: HYSTERECTOMY VAGINAL;  Surgeon: FerJonnie KindD;  Location: [facility];  Service: Gynecology;  Laterality: N/A;     Family History: Family History  Problem Relation Age of Onset  . Cancer Mother        skin  . Depression Mother   . Hypertension Mother   . Hyperlipidemia Mother   . Diabetes Mother   . Cancer Father        skin  . Arthritis Father   . Hypertension Father   . Hyperlipidemia Father   . Asthma Brother   . Depression Maternal Grandmother   . Hypertension Maternal Grandmother   . Mental illness Maternal Grandmother   . Cancer Maternal Grandfather        kidney  . Hypertension Maternal Grandfather   . Kidney disease Maternal Grandfather   . Emphysema Paternal Grandmother   . Alzheimer's disease Paternal Grandmother   . Parkinson's disease Paternal Grandfather   . Colon cancer Neg Hx   . Colon polyps Neg Hx   . Allergic rhinitis Neg Hx   . Eczema Neg Hx   . Urticaria Neg Hx  Social History: [place] lives at home [place] family.  They  live in a house that was built in the 1800s.  There is wood and linoleum in the main living areas and carpeting in the bedroom.  They have gas heating and central cooling.  They have quite a collection of animals including dogs, birds, gerbils, and fish.  There are dogs, chickens, goats, and a cat outside.  She does have dust mite covers on her bed, but not her pillows. There is no tobacco exposure in the home.  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       Review of Systems: a 14-point review of systems is pertinent for what is mentioned in HPI.  Otherwise, all other systems were negative. Constitutional: negative other than that listed in the HPI Eyes: negative other than that listed in the HPI Ears, nose, mouth, throat, and face: negative other than that listed in the HPI Respiratory: negative other than that listed in the HPI Cardiovascular: negative other than that listed in the HPI Gastrointestinal: negative other than that listed in the HPI Genitourinary: negative other than that listed in the HPI Integument: negative other than that listed in the HPI Hematologic: negative other than that listed in the HPI Musculoskeletal: negative other than that listed in the HPI Neurological: negative other than that listed in the HPI Allergy/Immunologic: negative other than that listed in the HPI    Objective:   Blood pressure 130/90, pulse 71, temperature 98.8 F (37.1 C), temperature source Oral, resp. rate (!) 22, height 5' 2.5" (1.588 m), weight 178 lb 9.6 oz (81 kg), SpO2 96 %. Body mass index is 32.15 kg/m.   Physical Exam:  General: Alert, interactive, in no acute distress. Pleasant. Eyes: No conjunctival injection bilaterally, no discharge on the right, no discharge on the left and no Horner-Trantas dots present. PERRL bilaterally. EOMI without pain. No photophobia.  Ears: Right TM pearly gray with normal light reflex, Left TM pearly gray with normal light reflex, Right  TM intact without perforation and Left TM intact without perforation.  Nose/Throat: External nose within normal limits and septum midline. Turbinates edematous and pale without discharge. Posterior oropharynx mildly erythematous without cobblestoning in the posterior oropharynx. Tonsils 2+ without exudates.  Tongue without thrush. Neck: Supple without thyromegaly. Trachea midline. Adenopathy: no enlarged lymph nodes appreciated in the anterior cervical, occipital, axillary, epitrochlear, inguinal, or popliteal regions. Lungs: Clear to auscultation without wheezing, rhonchi or rales. No increased work of breathing. CV: Normal S1/S2. No murmurs. Capillary refill <2 seconds.  Abdomen: Nondistended, nontender. No guarding or rebound tenderness. Bowel sounds present in all fields and hypoactive  Skin: Scattered erythematous urticarial type lesions primarily located over the sun exposed areas including the bilateral arms, neck ,and face. There are much fewer hives on the chest and abdomen , nonvesicular. Extremities:  No clubbing, cyanosis or edema. Neuro:   Grossly intact. No focal deficits appreciated. Responsive to questions.  Diagnostic studies: deferred due to recent antihistamine use       Salvatore Marvel, MD [facility]

## 2018-02-26 NOTE — Patient Instructions (Signed)
1. Allergic urticaria -I am not convinced that your rash is from alpha gal sensitivity, since your labs were not that impressive. - We will start a prednisone taper: take 3 tabs (39m) twice daily for 3 days, then 2 tabs (258m twice daily for 3 days, then 1 tab (1081mtwice daily for 3 days, then STOP. - Your history does not have any "red flags" such as fevers, joint pains, or permanent skin changes that would be concerning for a more serious cause of hives.  - We will get some labs to rule out serious causes of hives: tryptase level, CMP, ESR, and CRP. - Chronic hives are often times a self limited process and will "burn themselves out" over 6-12 months, although this is not always the case.  - In the meantime, start suppressive dosing of antihistamines:   - Morning: Zyrtec (cetirizine) 10-73m35mne or twotablets)  - Evening: Zyrtec (cetirizine) 10-73mg13me or two tablets) + Singulair (montelukast) 10mg 4mu can change this dosing at home, decreasing the dose as needed or increasing the dosing as needed.  - We may consider patch testing in the future if there is no improvement.  2. Adverse food reaction -EpiPen prescription provided. -Anaphylaxis management plan provided. -We may consider additional food testing in the future.   3. No follow-ups on file.   Please inform us of [facility] visits, hospitalizations, or changes in symptoms. Call us befKoreae going to the ED for breathing or allergy symptoms since we might be able to fit you in for a sick visit. Feel free to contact us anyKoreame with any questions, problems, or concerns.  It was a pleasure to meet you and your family today!  Websites that have reliable patient information: 1. American Academy of Asthma, Allergy, and Immunology: www.aaaai.org 2. Food Allergy Research and Education (FARE): foodallergy.org 3. Mothers of Asthmatics: http://www.asthmacommunitynetwork.org 4. American College of Allergy, Asthma, and  Immunology: www.acMonthlyElectricBill.co.uke sure you are registered to vote!

## 2018-02-28 ENCOUNTER — Encounter: Payer: Self-pay | Admitting: Allergy & Immunology

## 2018-02-28 ENCOUNTER — Ambulatory Visit (INDEPENDENT_AMBULATORY_CARE_PROVIDER_SITE_OTHER): Payer: Medicaid Other | Admitting: Orthopaedic Surgery

## 2018-03-24 ENCOUNTER — Emergency Department (HOSPITAL_COMMUNITY)
Admission: EM | Admit: 2018-03-24 | Discharge: 2018-03-24 | Disposition: A | Discharge status: Home / Self Care | Payer: Medicaid Other | Attending: Emergency Medicine | Admitting: Emergency Medicine

## 2018-03-24 ENCOUNTER — Other Ambulatory Visit: Payer: Self-pay

## 2018-03-24 ENCOUNTER — Emergency Department (HOSPITAL_COMMUNITY): Payer: Medicaid Other

## 2018-03-24 DIAGNOSIS — R51 Headache: Secondary | ICD-10-CM | POA: Insufficient documentation

## 2018-03-24 DIAGNOSIS — Z79899 Other long term (current) drug therapy: Secondary | ICD-10-CM | POA: Insufficient documentation

## 2018-03-24 DIAGNOSIS — M419 Scoliosis, unspecified: Secondary | ICD-10-CM | POA: Insufficient documentation

## 2018-03-24 DIAGNOSIS — J45909 Unspecified asthma, uncomplicated: Secondary | ICD-10-CM | POA: Insufficient documentation

## 2018-03-24 DIAGNOSIS — M542 Cervicalgia: Secondary | ICD-10-CM | POA: Diagnosis not present

## 2018-03-24 LAB — CBC WITH DIFFERENTIAL/PLATELET
Basophils Absolute: 0 K/uL (ref 0.0–0.1)
Basophils Relative: 0 %
Eosinophils Absolute: 0.1 K/uL (ref 0.0–0.7)
Eosinophils Relative: 2 %
HCT: 44.1 % (ref 36.0–46.0)
Hemoglobin: 14.7 g/dL (ref 12.0–15.0)
Lymphocytes Relative: 13 %
Lymphs Abs: 1.2 K/uL (ref 0.7–4.0)
MCH: 30.9 pg (ref 26.0–34.0)
MCHC: 33.3 g/dL (ref 30.0–36.0)
MCV: 92.6 fL (ref 78.0–100.0)
Monocytes Absolute: 0.6 K/uL (ref 0.1–1.0)
Monocytes Relative: 7 %
Neutro Abs: 7.5 K/uL (ref 1.7–7.7)
Neutrophils Relative %: 78 %
Platelets: 304 K/uL (ref 150–400)
RBC: 4.76 MIL/uL (ref 3.87–5.11)
RDW: 12.6 % (ref 11.5–15.5)
WBC: 9.5 K/uL (ref 4.0–10.5)

## 2018-03-24 LAB — COMPREHENSIVE METABOLIC PANEL WITH GFR
ALT: 20 U/L (ref 0–44)
AST: 19 U/L (ref 15–41)
Albumin: 4.1 g/dL (ref 3.5–5.0)
Alkaline Phosphatase: 81 U/L (ref 38–126)
Anion gap: 8 (ref 5–15)
BUN: 14 mg/dL (ref 6–20)
CO2: 28 mmol/L (ref 22–32)
Calcium: 9.2 mg/dL (ref 8.9–10.3)
Chloride: 104 mmol/L (ref 98–111)
Creatinine, Ser: 0.78 mg/dL (ref 0.44–1.00)
GFR calc Af Amer: >60 mL/min
GFR calc non Af Amer: >60 mL/min
Glucose, Bld: 99 mg/dL (ref 70–99)
Potassium: 4.1 mmol/L (ref 3.5–5.1)
Sodium: 140 mmol/L (ref 135–145)
Total Bilirubin: 1.3 mg/dL — ABNORMAL HIGH (ref 0.3–1.2)
Total Protein: 7.7 g/dL (ref 6.5–8.1)

## 2018-03-24 MED ORDER — ONDANSETRON HCL 4 MG/2ML IJ SOLN
4.0000 mg | Freq: Once | INTRAMUSCULAR | Status: AC
  Administered 2018-03-24: 4 mg via INTRAVENOUS
  Filled 2018-03-24: qty 2

## 2018-03-24 NOTE — ED Provider Notes (Addendum)
[facility] EMERGENCY DEPARTMENT Provider Note   CSN: [id number] Arrival date & time: 03/24/18  4098     History   Chief Complaint Chief Complaint  Patient presents with  . Neck Pain    HPI Ruth Shah is a 42 y.o. female.  HPI   She presents for evaluation of neck pain for 3 days without trauma. Pain is greater right than left. Also has generalized achiness, and headache. No nausea/vomiting. No cough/SOB. She has been diagnosed with Lyme disease (treated with Doxycycline) and alpha gal allergy/meat intolerance. Recently treated with Prednisone and antihistamines by Dermatologist.  No known sick contacts.There are no other known modifying factors.   Past Medical History:  Diagnosis Date  . ADD (attention deficit disorder)   . Arthritis   . Asthma    mild seasonal reactions to mold and mildew  . Back pain   . Depression   . Fibromyalgia   . GERD (gastroesophageal reflux disease)   . Herpes simplex disease   . History of kidney stones   . Migraines   . Paresthesia of both hands   . PONV (postoperative nausea and vomiting)   . Urticaria     Patient Active Problem List   Diagnosis Date Noted  . Gastroesophageal reflux disease   . Hiatal hernia   . Hematometra 10/02/2017  . Pelvic pain in female 10/02/2017  . Status post vaginal hysterectomy 10/02/2017  . Rectal bleeding 07/26/2017  . Rectocele, female 06/28/2017  . Uterine prolapse 06/28/2017  . Diarrhea 04/26/2017  . Chronic bilateral low back pain without sciatica 02/15/2017  . Dyspepsia 12/21/2016  . Dysphagia 12/21/2016  . Nausea without vomiting 12/21/2016  . Chronic migraine 07/20/2016  . Neck pain 07/20/2016  . 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 H/O Genital warts 08/12/2015    Past Surgical History:  Procedure Laterality Date  . ENDOMETRIAL ABLATION    . ESOPHAGEAL MANOMETRY N/A 10/15/2017   Procedure: ESOPHAGEAL MANOMETRY (EM);  Surgeon: Napoleon Form, MD;  Location: [facility];  Service:  Endoscopy;  Laterality: N/A;  . ESOPHAGOGASTRODUODENOSCOPY (EGD) WITH PROPOFOL N/A 12/26/2016   Dr. Darrick Penna: one benign-appearing, intrinsic stenosis that was widely patent and traversed. Small hiatal hernia, few small sessile polyps. Chronic gastritis. Normal duodenum, negative celiac sprue.   Marland Kitchen HERNIA REPAIR     inguinal as a baby   . left ovarian removal due to cyst    . PILONIDAL CYST / SINUS EXCISION  2009  . RECTOCELE REPAIR N/A 10/02/2017   Procedure: POSTERIOR REPAIR (RECTOCELE);  Surgeon: Tilda Burrow, MD;  Location: [facility];  Service: Gynecology;  Laterality: N/A;  . SINOSCOPY    . TUBAL LIGATION    . VAGINAL HYSTERECTOMY N/A 10/02/2017   Procedure: HYSTERECTOMY VAGINAL;  Surgeon: Tilda Burrow, MD;  Location: [facility];  Service: Gynecology;  Laterality: N/A;     OB History    Gravida  5   Para  4   Term      Preterm      AB  1   Living  4     SAB  1   TAB      Ectopic      Multiple      Live Births               Home Medications    Prior to Admission medications   Medication Sig Start Date End Date Taking? Authorizing Provider  cetirizine (ZYRTEC) 10 MG tablet Take 1-2 tablets (10-20 mg  total) by mouth 2 (two) times daily. 02/26/18  Yes Alfonse Spruce, MD  montelukast (SINGULAIR) 10 MG tablet Take 1 tablet (10 mg total) by mouth at bedtime. 02/26/18  Yes Alfonse Spruce, MD  pantoprazole (PROTONIX) 40 MG tablet Take 1 tablet (40 mg total) by mouth 2 (two) times daily before a meal. 12/06/17  Yes Gelene Mink, NP  propranolol ER (INDERAL LA) 60 MG 24 hr capsule Take 60 mg by mouth at bedtime. 12/13/17  Yes [provider]  sertraline (ZOLOFT) 50 MG tablet Take 50 mg by mouth daily.   Yes [provider]  SUMAtriptan (IMITREX) 50 MG tablet Take by mouth as needed.    Yes [provider]  predniSONE (DELTASONE) 10 MG tablet take 3 tabs (30mg ) twice daily for 3 days, then 2 tabs (20mg ) twice daily for 3 days, then 1 tab  (10mg ) twice daily for 3 days, then STOP. Patient not taking: Reported on 03/24/2018 02/26/18   Alfonse Spruce, MD    Family History Family History  Problem Relation Age of Onset  . Cancer Mother        skin  . Depression Mother   . Hypertension Mother   . Hyperlipidemia Mother   . Diabetes Mother   . Cancer Father        skin  . Arthritis Father   . Hypertension Father   . Hyperlipidemia Father   . Asthma Brother   . Depression Maternal Grandmother   . Hypertension Maternal Grandmother   . Mental illness Maternal Grandmother   . Cancer Maternal Grandfather        kidney  . Hypertension Maternal Grandfather   . Kidney disease Maternal Grandfather   . Emphysema Paternal Grandmother   . Alzheimer's disease Paternal Grandmother   . Parkinson's disease Paternal Grandfather   . Colon cancer Neg Hx   . Colon polyps Neg Hx   . Allergic rhinitis Neg Hx   . Eczema Neg Hx   . Urticaria Neg Hx     Social History Social History   Tobacco Use  . Smoking status: Never Smoker  . Smokeless tobacco: Never Used  Substance Use Topics  . Alcohol use: No    Alcohol/week: 0.0 oz  . Drug use: No     Allergies   Beef-derived products; Dilaudid [hydromorphone hcl]; Sulfa antibiotics; Toradol [ketorolac tromethamine]; and Vicodin [hydrocodone-acetaminophen]   Review of Systems Review of Systems  All other systems reviewed and are negative.    Physical Exam Updated Vital Signs BP 118/90   Pulse 75   Temp 98.7 F (37.1 C) (Oral)   Resp 18   Ht 5\' 3"  (1.6 m)   Wt 81.6 kg (180 lb)   SpO2 97%   BMI 31.89 kg/m   Physical Exam  Constitutional: She is oriented to person, place, and time. She appears well-developed and well-nourished. No distress.  Overweight  HENT:  Head: Normocephalic and atraumatic.  Eyes: Pupils are equal, round, and reactive to light. Conjunctivae and EOM are normal.  Neck: Normal range of motion and phonation normal. Neck supple.  No meningismus.   Cardiovascular: Normal rate and regular rhythm.  Pulmonary/Chest: Effort normal and breath sounds normal. She exhibits no tenderness.  Abdominal: Soft. She exhibits no distension. There is no tenderness. There is no guarding.  Musculoskeletal: Normal range of motion.  Neurological: She is alert and oriented to person, place, and time. She exhibits normal muscle tone.  No dysarthria, aphasia or nystagmus.  Skin: Skin is warm and dry.  Psychiatric: She has a normal mood and affect. Her behavior is normal. Judgment and thought content normal.  Nursing note and vitals reviewed.    ED Treatments / Results  Labs (all labs ordered are listed, but only abnormal results are displayed) Labs Reviewed  COMPREHENSIVE METABOLIC PANEL - Abnormal; Notable for the following components:      Result Value   Total Bilirubin 1.3 (*)    All other components within normal limits  CBC WITH DIFFERENTIAL/PLATELET  URINALYSIS, ROUTINE W REFLEX MICROSCOPIC    EKG None  Radiology Ct Head Wo Contrast  Result Date: 03/24/2018 CLINICAL DATA:  Meningeal irritation signs EXAM: CT HEAD WITHOUT CONTRAST TECHNIQUE: Contiguous axial images were obtained from the base of the skull through the vertex without intravenous contrast. COMPARISON:  10/19/2009 brain MRI FINDINGS: Brain: No evidence of acute infarction, hemorrhage, hydrocephalus, extra-axial collection or mass lesion/mass effect. Vascular: No hyperdense vessel or unexpected calcification. Skull: Normal. Negative for fracture or focal lesion. Sinuses/Orbits: Mucosal thickening in the left maxillary sinus. No visible fluid levels. IMPRESSION: 1. Negative intracranial imaging. 2. Mucosal thickening in the left maxillary sinus. Electronically Signed   By: Marnee SpringJonathon  Watts M.D.   On: 03/24/2018 14:13    Procedures .Lumbar Puncture Date/Time: 03/24/2018 2:55 PM Performed by: Mancel BaleWentz, Kc Summerson, MD Authorized by: Mancel BaleWentz, Roza Creamer, MD   Consent:    Consent obtained:   Verbal and written   Consent given by:  Patient   Risks discussed:  Bleeding, infection, pain and repeat procedure   Alternatives discussed:  Observation Pre-procedure details:    Procedure purpose:  Diagnostic   Preparation: Patient was prepped and draped in usual sterile fashion   Anesthesia (see MAR for exact dosages):    Anesthesia method:  Local infiltration   Local anesthetic:  Lidocaine 1% w/o epi Procedure details:    Lumbar space:  L3-L4 interspace   Patient position:  Sitting   Needle gauge:  22   Needle type:  Spinal needle - Quincke tip   Needle length (in):  3.5   Ultrasound guidance: no     Number of attempts:  2   Total volume (ml): None. Post-procedure:    Puncture site:  Adhesive bandage applied   Patient tolerance of procedure:  Tolerated with difficulty Comments:     Patient experienced syncope during second lumbar puncture attempt.  Efforts terminated at that point.  Syncope brief and she had recovery of sensorium within 20 seconds.  Was noted to be gritting her teeth prior to syncope, she complained of some tooth pain, soreness after she awoke.  There was no visible injury to her teeth.  Lumbar Puncture Procedure Clarification- Attempted LP x 2, without return of spinal fluid. Procedure terminated when the patient had a syncopal episode. The syncope was pain-related, and brief. There were no complications. No further efforts to obtain spinal fluid were done.      (including critical care time)  Medications Ordered in ED Medications  ondansetron (ZOFRAN) injection 4 mg (4 mg Intravenous Given 03/24/18 1033)     Initial Impression / Assessment and Plan / ED Course  I have reviewed the triage vital signs and the nursing notes.  Pertinent labs & imaging results that were available during my care of the patient were reviewed by me and considered in my medical decision making (see chart for details).  Clinical Course as of Mar 25 1607  Sun Mar 24, 2018  1540  Patient is alert, and  states she feels better.  She still has some mild teeth soreness.  Repeat blood pressure at 1450 still elevated at 171/95.  Will recheck, and give oral fluid trial.   [EW]    Clinical Course User Index [EW] Mancel Bale, MD     Patient Vitals for the past 24 hrs:  BP Temp Temp src Pulse Resp SpO2 Height Weight  03/24/18 1544 - 98.7 F (37.1 C) Oral - - - - -  03/24/18 1539 118/90 - - 75 - 97 % - -  03/24/18 1501 - - - 90 - - - -  03/24/18 1500 129/83 - - - - - - -  03/24/18 1450 (!) 171/95 - - - - - - -  03/24/18 0912 - - - - - - 5\' 3"  (1.6 m) 81.6 kg (180 lb)  03/24/18 0911 (!) 165/104 100 F (37.8 C) Oral 84 18 100 % - -    4:07 PM Reevaluation with update and discussion. After initial assessment and treatment, an updated evaluation reveals she is more comfortable, and has no further complaints, findings discussed and questions answered. Mancel Bale   Medical Decision Making: Nonspecific neck pain and headache, with improvement during course of evaluation and treatment. Lumbar puncture was attempted for possibility of CNS Lyme disease however I think that this is a very low likelihood.  LP likely difficult because of body habitus and pre-existing scoliosis with degenerative changes of the spine.  Patient had syncope during this procedure.  Patient has reassuring recovery of normal vital signs, during the treatment course.  No indication for hospitalization or further intervention at this time.  Suspect musculo-skeletal source of neck pain and headache.  CRITICAL CARE-no Performed by: Mancel Bale   Nursing Notes Reviewed/ Care Coordinated Applicable Imaging Reviewed Interpretation of Laboratory Data incorporated into ED treatment  The patient appears reasonably screened and/or stabilized for discharge and I doubt any other medical condition or other [facility] requiring further screening, evaluation, or treatment in the ED at this time prior to  discharge.  Plan: Home Medications-OTC analgesia, continue usual medications; Home Treatments-rest, fluids; return here if the recommended treatment, does not improve the symptoms; Recommended follow up-PCP, as needed   Final Clinical Impressions(s) / ED Diagnoses   Final diagnoses:  Neck pain    ED Discharge Orders    None       Mancel Bale, MD 03/24/18 1609    Mancel Bale, MD 04/03/18 1610    Mancel Bale, MD 04/09/18 1726

## 2018-03-24 NOTE — ED Triage Notes (Addendum)
Patient c/o neck pain that started x3 days ago and has progressively gotten worse. Patient states pain in neck is a pressure that is now radiating into head causing a migraine. Patient reports nausea, dizziness, and photosensitivity. Denies any slurred speech or facial drooping. Patients arms feel heavy and left side feels "numb and tingly." Patient recently diagnosed with lyme's disease and alpha positive allergy.

## 2018-03-24 NOTE — Discharge Instructions (Addendum)
There is no clear cause for your discomfort.  Try using heat on the sore area of your neck, to help the discomfort.  Use ibuprofen or acetaminophen for pain.

## 2018-04-23 ENCOUNTER — Ambulatory Visit (INDEPENDENT_AMBULATORY_CARE_PROVIDER_SITE_OTHER): Payer: Medicaid Other | Admitting: Allergy & Immunology

## 2018-04-23 ENCOUNTER — Ambulatory Visit: Payer: Medicaid Other | Admitting: Allergy & Immunology

## 2018-04-23 ENCOUNTER — Encounter: Payer: Self-pay | Admitting: Allergy & Immunology

## 2018-04-23 VITALS — BP 150/80 | HR 88 | Temp 99.0°F | Resp 20

## 2018-04-23 DIAGNOSIS — J3089 Other allergic rhinitis: Secondary | ICD-10-CM

## 2018-04-23 DIAGNOSIS — T781XXD Other adverse food reactions, not elsewhere classified, subsequent encounter: Secondary | ICD-10-CM

## 2018-04-23 DIAGNOSIS — L500 Allergic urticaria: Secondary | ICD-10-CM | POA: Diagnosis not present

## 2018-04-23 DIAGNOSIS — J302 Other seasonal allergic rhinitis: Secondary | ICD-10-CM | POA: Diagnosis not present

## 2018-04-23 DIAGNOSIS — J4530 Mild persistent asthma, uncomplicated: Secondary | ICD-10-CM

## 2018-04-23 NOTE — Progress Notes (Signed)
FOLLOW UP  Date of Service/Encounter:  04/23/18   Assessment:   Allergic urticaria  Adverse food reaction (alpha gal syndrome)  Mild persistent asthma without complication  Perennial and seasonal allergic rhinitis (trees, dust mite, dog)  Plan/Recommendations:   1. 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 - I would continue to avoid red meats (we can retest in 6-12 months to see where you IgE level to alpha gal is trending)  - Continue with all of your medications.  - You can use an extra dose of the antihistamine, if needed, for breakthrough symptoms.  - Consider allergy shots as a means of long-term control. - Allergy shots "re-train" and "reset" the immune system to ignore environmental allergens and decrease the resulting immune response to those allergens (sneezing, itchy watery eyes, runny nose, nasal congestion, etc).    - Allergy shots improve symptoms in 75-85% of patients.  - We can discuss more at the next appointment if the medications are not working for you. - Your history does not have any "red flags" such as fevers, joint pains, or permanent skin changes that would be concerning for a more serious cause of hives, but we will get some labs to rule out serious causes of hives: tryptase level, CMP, ESR, and CRP. - Chronic hives are often times a self limited process and will "burn themselves out" over 6-12 months, although this is not always the case.  - In the meantime, start suppressive dosing of antihistamines:   - Morning: Zyrtec (cetirizine) 10-59m (one or twotablets)  - Evening: Zyrtec (cetirizine) 10-247m(one or two tablets) + Singulair (montelukast) 1030m You can change this dosing at home, decreasing the dose as needed or increasing the dosing as needed.   2. Adverse food reaction - with negative testing to the most common foods - EpiPen prescription up  to date. - Anaphylaxis management plan provided. - Testing to the most common foods was negative (peanut, sesame, cashew, soy, fish mix, shellfish mix, wheat, milk, casein, egg) - There is a the low positive predictive value of food allergy testing and hence the high possibility of false positives. - In contrast, food allergy testing has a high negative predictive value, therefore if testing is negative we can be relatively assured that they are indeed negative.   3. Mild persistent asthma, uncomplicated - Lung testing looked great today.  - We will not add on a daily controller medication. - Daily controller medication(s): Singulair 16m31mily - Prior to physical activity: ProAir 2 puffs 10-15 minutes before physical activity. - Rescue medications: ProAir 4 puffs every 4-6 hours as needed - Asthma control goals:  * Full participation in all desired activities (may need albuterol before activity) * Albuterol use two time or less a week on average (not counting use with activity) * Cough interfering with sleep two time or less a month * Oral steroids no more than once a year * No hospitalizations  4. Return in about 6 months (around 10/24/2018).  Subjective:   Ruth Shah 42 y70. female presenting today for follow up of  Chief Complaint  Patient presents with  . Allergy Testing    MistSTARKISHA TULLIS a history of the following: Patient Active Problem List   Diagnosis Date Noted  . Mild intermittent asthma, uncomplicated 07/325/85/2778Adverse food reaction 04/24/2018  . Perennial allergic rhinitis 04/24/2018  .  Allergic urticaria 04/24/2018  . Gastroesophageal reflux disease   . Hiatal hernia   . Hematometra 10/02/2017  . Pelvic pain in female 10/02/2017  . Status post vaginal hysterectomy 10/02/2017  . Rectal bleeding 07/26/2017  . Rectocele, female 06/28/2017  . Uterine prolapse 06/28/2017  . Diarrhea 04/26/2017  . Chronic bilateral low back pain without sciatica  02/15/2017  . Dyspepsia 12/21/2016  . Dysphagia 12/21/2016  . Nausea without vomiting 12/21/2016  . Chronic migraine 07/20/2016  . Neck pain 07/20/2016  . 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 H/O Genital warts 08/12/2015    History obtained from: chart review and patient.  [facility] Primary Care Provider is Rory Percy, MD.     Ruth Shah is a 42 y.o. female presenting for a follow up visit. Ruth Shah was last seen in our office in June 2019 as a new patient evaluation.  At that time, she had what sounded like solar urticaria.  She had Artie been worked up for alpha gal, which showed a minimally positive IgE.  However, this blood was drawn fairly early after the suspected tick bite.  In any case, I recommended increasing her cetirizine dosing and adding Singulair.  We also started her on a prednisone taper.  We did obtain labs, which unfortunately do not seem to have been collected.  Since the last visit, she has mostly done well. She has had a couple of reactions since the last time that she was here but overall their severity is much less. She did have an episode when she went to take care of a friend's horses/dog. She had an asthma attack and required the use of her inhaler. But between episodes of needing her inhaler - approximately every two months or so - her asthma is essentially not even on her mind. It is otherwise well controlled. Her activities of daily living have not been limited. She has required no Emergency Department or Urgent Care visits for her asthma. She has required zero courses of systemic steroids for asthma exacerbations since the last visit. ACT score today is 23, indicating excellent asthma symptom control.   She has been avoiding meat since the last visit. She is eating a lot of fish/chicken. She has otherwise not changed her diet at all. Overall her pruritis and rash have markedly improved. She is very pleased with how well she is doing, but when she stopped her antihistamines  a few days ago for possible skin testing, she did start to have some more itching at that time. She has had needed to use her epinephrine at all. She has remained on her antihistamines and montelukast (aside from stopping them for this visit).    Otherwise, there have been no changes to her past medical history, surgical history, family history, or social history.   Review of Systems: a 14-point review of systems is pertinent for what is mentioned in HPI.  Otherwise, all other systems were negative. Constitutional: negative other than that listed in the HPI Eyes: negative other than that listed in the HPI Ears, nose, mouth, throat, and face: negative other than that listed in the HPI Respiratory: negative other than that listed in the HPI Cardiovascular: negative other than that listed in the HPI Gastrointestinal: negative other than that listed in the HPI Genitourinary: negative other than that listed in the HPI Integument: negative other than that listed in the HPI Hematologic: negative other than that listed in the HPI Musculoskeletal: negative other than that listed in the HPI Neurological: negative other  than that listed in the HPI Allergy/Immunologic: negative other than that listed in the HPI    Objective:   Blood pressure (!) 150/80, pulse 88, temperature 99 F (37.2 C), temperature source Oral, resp. rate 20, SpO2 95 %. There is no height or weight on file to calculate BMI.   Physical Exam:  General: Alert, interactive, in no acute distress. Smiling and pleasant female.  Eyes: No conjunctival injection bilaterally, no discharge on the right, no discharge on the left and no Horner-Trantas dots present. PERRL bilaterally. EOMI without pain. No photophobia.  Ears: Right TM pearly gray with normal light reflex, Left TM pearly gray with normal light reflex, Right TM intact without perforation and Left TM intact without perforation.  Nose/Throat: External nose within normal limits  and septum midline. Turbinates edematous and pale with clear discharge. Posterior oropharynx erythematous with cobblestoning in the posterior oropharynx. Tonsils 2+ without exudates.  Tongue without thrush. Lungs: Clear to auscultation without wheezing, rhonchi or rales. No increased work of breathing. CV: Normal S1/S2. No murmurs. Capillary refill <2 seconds.  Skin: Warm and dry, without lesions or rashes. Some mild excoriations present, but otherwise she looks much better than the last time that I saw her.  Neuro:   Grossly intact. No focal deficits appreciated. Responsive to questions.  Diagnostic studies:   Spirometry: results normal (FEV1: 2.73/109%, FVC: 3.26/100%, FEV1/FVC: 83%).    Spirometry consistent with normal pattern.  Allergy Studies:   Indoor/Outdoor Percutaneous Adult Environmental Panel: positive to elm, pecan pollen, Df mite, Dp mites and dog. Otherwise negative with adequate controls.  Most Common Foods Panel (peanut, cashew, soy, fish mix, shellfish mix, wheat, milk, egg): negative to all with adequate controls  Allergy testing results were read and interpreted by myself, documented by clinical staff.      Salvatore Marvel, MD  Allergy and [facility] [place]

## 2018-04-23 NOTE — Patient Instructions (Addendum)
1. 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 - I would continue to avoid red meats (we can retest in 6-12 months to see where you IgE level to alpha gal is trending)  - Continue with all of your medications.  - You can use an extra dose of the antihistamine, if needed, for breakthrough symptoms.  - Consider allergy shots as a means of long-term control. - Allergy shots "re-train" and "reset" the immune system to ignore environmental allergens and decrease the resulting immune response to those allergens (sneezing, itchy watery eyes, runny nose, nasal congestion, etc).    - Allergy shots improve symptoms in 75-85% of patients.  - We can discuss more at the next appointment if the medications are not working for you. - Your history does not have any "red flags" such as fevers, joint pains, or permanent skin changes that would be concerning for a more serious cause of hives, but we will get some labs to rule out serious causes of hives: tryptase level, CMP, ESR, and CRP. - Chronic hives are often times a self limited process and will "burn themselves out" over 6-12 months, although this is not always the case.  - In the meantime, start suppressive dosing of antihistamines:   - Morning: Zyrtec (cetirizine) 10-52m (one or twotablets)  - Evening: Zyrtec (cetirizine) 10-215m(one or two tablets) + Singulair (montelukast) 1026m You can change this dosing at home, decreasing the dose as needed or increasing the dosing as needed.   2. Adverse food reaction - with negative testing to the most common foods - EpiPen prescription up to date. - Anaphylaxis management plan provided. - Testing to the most common foods was negative (peanut, sesame, cashew, soy, fish mix, shellfish mix, wheat, milk, casein, egg) - There is a the low positive predictive value of food allergy testing and hence the high possibility of  false positives. - In contrast, food allergy testing has a high negative predictive value, therefore if testing is negative we can be relatively assured that they are indeed negative.   3. Mild persistent asthma, uncomplicated - Lung testing looked great today.  - We will not add on a daily controller medication. - Daily controller medication(s): Singulair 66m31mily - Prior to physical activity: ProAir 2 puffs 10-15 minutes before physical activity. - Rescue medications: ProAir 4 puffs every 4-6 hours as needed - Asthma control goals:  * Full participation in all desired activities (may need albuterol before activity) * Albuterol use two time or less a week on average (not counting use with activity) * Cough interfering with sleep two time or less a month * Oral steroids no more than once a year * No hospitalizations  4. Return in about 6 months (around 10/24/2018).   Please inform us [facility] Emergency Department visits, hospitalizations, or changes in symptoms. Call us [place] going to the ED for breathing or allergy symptoms since we might be able to fit you in for a sick visit. Feel free to contact us [place] with any questions, problems, or concerns.  It was a pleasure to see you and your family again today!  Websites that have reliable patient information: 1. American Academy of Asthma, Allergy, and Immunology: www.aaaai.org 2. Food Allergy Research and Education (FARE): foodallergy.org 3. Mothers of Asthmatics: http://www.asthmacommunitynetwork.org 4. American College of Allergy, Asthma, and Immunology: www.MonthlyElectricBill.co.ukake sure you are registered to vote!  Reducing Pollen Exposure  The American Academy of Allergy, Asthma and Immunology suggests the following steps to reduce your exposure to pollen during allergy seasons.    1. Do not hang sheets or clothing out to dry; pollen may collect on these items. 2. Do not mow lawns or spend time around freshly cut grass; mowing  stirs up pollen. 3. Keep windows closed at night.  Keep car windows closed while driving. 4. Minimize morning activities outdoors, a time when pollen counts are usually at their highest. 5. Stay indoors as much as possible when pollen counts or humidity is high and on windy days when pollen tends to remain in the air longer. 6. Use air conditioning when possible.  Many air conditioners have filters that trap the pollen spores. 7. Use a HEPA room air filter to remove pollen form the indoor air you breathe.  Control of Dog or Cat Allergen  Avoidance is the best way to manage a dog or cat allergy. If you have a dog or cat and are allergic to dog or cats, consider removing the dog or cat from the home. If you have a dog or cat but don't want to find it a new home, or if your family wants a pet even though someone in the household is allergic, here are some strategies that may help keep symptoms at bay:  1. Keep the pet out of your bedroom and restrict it to only a few rooms. Be advised that keeping the dog or cat in only one room will not limit the allergens to that room. 2. Don't pet, hug or kiss the dog or cat; if you do, wash your hands with soap and water. 3. High-efficiency particulate air (HEPA) cleaners run continuously in a bedroom or living room can reduce allergen levels over time. 4. Regular use of a high-efficiency vacuum cleaner or a central vacuum can reduce allergen levels. 5. Giving your dog or cat a bath at least once a week can reduce airborne allergen.  Control of House Dust Mite Allergen    House dust mites play a major role in allergic asthma and rhinitis.  They occur in environments with high humidity wherever human skin, the food for dust mites is found. High levels have been detected in dust obtained from mattresses, pillows, carpets, upholstered furniture, bed covers, clothes and soft toys.  The principal allergen of the house dust mite is found in its feces.  A gram of  dust may contain 1,000 mites and 250,000 fecal particles.  Mite antigen is easily measured in the air during house cleaning activities.    1. Encase mattresses, including the box spring, and pillow, in an air tight cover.  Seal the zipper end of the encased mattresses with wide adhesive tape. 2. Wash the bedding in water of 130 degrees Farenheit weekly.  Avoid cotton comforters/quilts and flannel bedding: the most ideal bed covering is the dacron comforter. 3. Remove all upholstered furniture from the bedroom. 4. Remove carpets, carpet padding, rugs, and non-washable window drapes from the bedroom.  Wash drapes weekly or use plastic window coverings. 5. Remove all non-washable stuffed toys from the bedroom.  Wash stuffed toys weekly. 6. Have the room cleaned frequently with a vacuum cleaner and a damp dust-mop.  The patient should not be in a room which is being cleaned and should wait 1 hour after cleaning before going into the room. 7. Close and seal all heating outlets in the bedroom.  Otherwise, the room will  become filled with dust-laden air.  An electric heater can be used to heat the room. 8. Reduce indoor humidity to less than 50%.  Do not use a humidifier.  Allergy Shots   Allergies are the result of a chain reaction that starts in the immune system. Your immune system controls how your body defends itself. For instance, if you have an allergy to pollen, your immune system identifies pollen as an invader or allergen. Your immune system overreacts by producing antibodies called Immunoglobulin E (IgE). These antibodies travel to cells that release chemicals, causing an allergic reaction.  The concept behind allergy immunotherapy, whether it is received in the form of shots or tablets, is that the immune system can be desensitized to specific allergens that trigger allergy symptoms. Although it requires time and patience, the payback can be long-term relief.  How Do Allergy Shots  Work?  Allergy shots work much like a vaccine. Your body responds to injected amounts of a particular allergen given in increasing doses, eventually developing a resistance and tolerance to it. Allergy shots can lead to decreased, minimal or no allergy symptoms.  There generally are two phases: build-up and maintenance. Build-up often ranges from three to six months and involves receiving injections with increasing amounts of the allergens. The shots are typically given once or twice a week, though more rapid build-up schedules are sometimes used.  The maintenance phase begins when the most effective dose is reached. This dose is different for each person, depending on how allergic you are and your response to the build-up injections. Once the maintenance dose is reached, there are longer periods between injections, typically two to four weeks.  Occasionally doctors give cortisone-type shots that can temporarily reduce allergy symptoms. These types of shots are different and should not be confused with allergy immunotherapy shots.  Who Can Be Treated with Allergy Shots?  Allergy shots may be a good treatment approach for people with allergic rhinitis (hay fever), allergic asthma, conjunctivitis (eye allergy) or stinging insect allergy.   Before deciding to begin allergy shots, you should consider:  . The length of allergy season and the severity of your symptoms . Whether medications and/or changes to your environment can control your symptoms . Your desire to avoid long-term medication use . Time: allergy immunotherapy requires a major time commitment . Cost: may vary depending on your insurance coverage  Allergy shots for children age 35 and older are effective and often well tolerated. They might prevent the onset of new allergen sensitivities or the progression to asthma.  Allergy shots are not started on patients who are pregnant but can be continued on patients who become pregnant  while receiving them. In some patients with other medical conditions or who take certain common medications, allergy shots may be of risk. It is important to mention other medications you talk to your allergist.   When Will I Feel Better?  Some may experience decreased allergy symptoms during the build-up phase. For others, it may take as long as 12 months on the maintenance dose. If there is no improvement after a year of maintenance, your allergist will discuss other treatment options with you.  If you aren't responding to allergy shots, it may be because there is not enough dose of the allergen in your vaccine or there are missing allergens that were not identified during your allergy testing. Other reasons could be that there are high levels of the allergen in your environment or major exposure to non-allergic triggers  like tobacco smoke.  What Is the Length of Treatment?  Once the maintenance dose is reached, allergy shots are generally continued for three to five years. The decision to stop should be discussed with your allergist at that time. Some people may experience a permanent reduction of allergy symptoms. Others may relapse and a longer course of allergy shots can be considered.  What Are the Possible Reactions?  The two types of adverse reactions that can occur with allergy shots are local and systemic. Common local reactions include very mild redness and swelling at the injection site, which can happen immediately or several hours after. A systemic reaction, which is less common, affects the entire body or a particular body system. They are usually mild and typically respond quickly to medications. Signs include increased allergy symptoms such as sneezing, a stuffy nose or hives.  Rarely, a serious systemic reaction called anaphylaxis can develop. Symptoms include swelling in the throat, wheezing, a feeling of tightness in the chest, nausea or dizziness. Most serious systemic reactions  develop within 30 minutes of allergy shots. This is why it is strongly recommended you wait in your doctor's office for 30 minutes after your injections. Your allergist is trained to watch for reactions, and his or her staff is trained and equipped with the proper medications to identify and treat them.  Who Should Administer Allergy Shots?  The preferred location for receiving shots is your prescribing allergist's office. Injections can sometimes be given at another facility where the physician and staff are trained to recognize and treat reactions, and have received instructions by your prescribing allergist.

## 2018-04-24 ENCOUNTER — Encounter: Payer: Self-pay | Admitting: Allergy & Immunology

## 2018-04-24 DIAGNOSIS — J4520 Mild intermittent asthma, uncomplicated: Secondary | ICD-10-CM | POA: Insufficient documentation

## 2018-04-24 DIAGNOSIS — J3089 Other allergic rhinitis: Secondary | ICD-10-CM | POA: Insufficient documentation

## 2018-04-24 DIAGNOSIS — T781XXA Other adverse food reactions, not elsewhere classified, initial encounter: Secondary | ICD-10-CM | POA: Insufficient documentation

## 2018-04-24 DIAGNOSIS — L500 Allergic urticaria: Secondary | ICD-10-CM | POA: Insufficient documentation

## 2018-04-26 LAB — THYROID ANTIBODIES
Thyroglobulin Antibody: <1 IU/mL (ref 0.0–0.9)
Thyroperoxidase Ab SerPl-aCnc: 14 IU/mL (ref 0–34)

## 2018-04-26 LAB — C-REACTIVE PROTEIN: CRP: 5 mg/L (ref 0–10)

## 2018-04-26 LAB — SEDIMENTATION RATE: Sed Rate: 2 mm/hr (ref 0–32)

## 2018-04-26 LAB — ANA: Anti Nuclear Antibody(ANA): POSITIVE — AB

## 2018-04-26 LAB — TRYPTASE: TRYPTASE: 4.2 ug/L (ref 2.2–13.2)

## 2018-05-16 ENCOUNTER — Encounter: Payer: Self-pay | Admitting: Women's Health

## 2018-05-16 ENCOUNTER — Ambulatory Visit: Payer: Medicaid Other | Admitting: Women's Health

## 2018-05-16 VITALS — BP 130/86 | HR 86 | Ht 63.0 in | Wt 171.6 lb

## 2018-05-16 DIAGNOSIS — N9089 Other specified noninflammatory disorders of vulva and perineum: Secondary | ICD-10-CM | POA: Diagnosis not present

## 2018-05-16 LAB — POCT WET PREP (WET MOUNT)
Clue Cells Wet Prep Whiff POC: NEGATIVE
Trichomonas Wet Prep HPF POC: ABSENT

## 2018-05-16 MED ORDER — NYSTATIN 100000 UNIT/GM EX OINT: 1.0000 "application " | TOPICAL_OINTMENT | Freq: Two times a day (BID) | CUTANEOUS | 0 refills | Status: DC

## 2018-05-16 MED ORDER — TRIAMCINOLONE ACETONIDE 0.1 % EX OINT: 1.0000 "application " | TOPICAL_OINTMENT | Freq: Two times a day (BID) | CUTANEOUS | 0 refills | Status: DC

## 2018-05-16 NOTE — Progress Notes (Signed)
GYN VISIT Patient name: Ruth Shah Shah MRN [id number]  Date of birth: 1975/12/24 Chief Complaint:   burning clitoris area  History of Present Illness:   Ruth Shah Shah is a 42 y.o. [phone] Caucasian female being seen today for report of burning in clitoral area x 1wk after having sex w/ husband. Has happened before, but went away on its own. Some increased discharge, no odor.  Also has alpha-gal syndrome. Has been reading on facebook group that women have noticed vulvovaginal sx and sex if partner consumed meat.   No LMP recorded. Patient has had a hysterectomy. The current method of family planning is status post hysterectomy. Last pap 08/12/15. Results were:  neg w/ -HRHPV Review of Systems:   Pertinent items are noted in HPI Denies fever/chills, dizziness, headaches, visual disturbances, fatigue, shortness of breath, chest pain, abdominal pain, vomiting, abnormal vaginal discharge/itching/odor/irritation, problems with periods, bowel movements, urination, or intercourse unless otherwise stated above.  Pertinent History Reviewed:  Reviewed past medical,surgical, social, obstetrical and family history.  Reviewed problem list, medications and allergies. Physical Assessment:   Vitals:   05/16/18 0844  BP: 130/86  Pulse: 86  Weight: 171 lb 9.6 oz (77.8 kg)  Height: 5\' 3"  (1.6 m)  Body mass index is 30.4 kg/m.       Physical Examination:   General appearance: alert, well appearing, and in no distress  Mental status: alert, oriented to person, place, and time  Skin: warm & dry   Cardiovascular: normal heart rate noted  Respiratory: normal respiratory effort, no distress  Abdomen: soft, non-tender   Pelvic: VULVA: normal appearing vulva with no masses, tenderness or lesions, VAGINA: normal appearing vagina with normal color and scant nonodorous discharge, no lesions  Extremities: no edema   Results for orders placed or performed in visit on 05/16/18 (from the past 24 hour(s))    POCT Wet Prep [name])   Collection Time: 05/16/18  9:13 AM  Result Value Ref Range   Source Wet Prep POC vaginal    WBC, Wet Prep HPF POC few    Bacteria Wet Prep HPF POC Few Few   BACTERIA WET PREP MORPHOLOGY POC     Clue Cells Wet Prep HPF POC None None   Clue Cells Wet Prep Whiff POC Negative Whiff    Yeast Wet Prep HPF POC None None   KOH Wet Prep POC     Trichomonas Wet Prep HPF POC Absent Absent    Assessment & Plan:  1) Clitoral irritation> neg wet prep, will send gc/ct, Rx mycolog as 2 separate ointments (triamcinolone 0.1% ointment and nystatin 100,000 units ointment). Discussed its possible could be r/t alpha-gal.    Meds:  Meds ordered this encounter  Medications  . triamcinolone ointment (KENALOG) 0.1 %    Sig: Apply 1 application topically 2 (two) times daily.    Dispense:  30 g    Refill:  0    Order Specific Question:   Supervising Provider    Answer:   Despina Hidden, LUTHER H [[id number]]  . nystatin ointment (MYCOSTATIN)    Sig: Apply 1 application topically 2 (two) times daily.    Dispense:  30 g    Refill:  0    Order Specific Question:   Supervising Provider    Answer:   Duane Lope H [[id number]]    Orders Placed This Encounter  Procedures  . GC/Chlamydia Probe Amp  . POCT Wet Prep [employer])    Return in about 1 year (  around 05/17/2019) for Physical.  Cheral MarkerKimberly R Booker CNM, [facility]NP-BC 05/16/2018 9:40 AM

## 2018-05-16 NOTE — Patient Instructions (Signed)
Mix small dot of each cream together then apply twice daily

## 2018-05-18 LAB — GC/CHLAMYDIA PROBE AMP
Chlamydia trachomatis, NAA: NEGATIVE
Neisseria gonorrhoeae by PCR: NEGATIVE

## 2018-05-21 ENCOUNTER — Encounter (INDEPENDENT_AMBULATORY_CARE_PROVIDER_SITE_OTHER): Payer: Self-pay | Admitting: Orthopaedic Surgery

## 2018-05-21 ENCOUNTER — Ambulatory Visit (INDEPENDENT_AMBULATORY_CARE_PROVIDER_SITE_OTHER): Payer: Medicaid Other | Admitting: Orthopaedic Surgery

## 2018-05-21 VITALS — BP 127/85 | HR 53 | Ht 63.0 in | Wt 172.0 lb

## 2018-05-21 DIAGNOSIS — M7711 Lateral epicondylitis, right elbow: Secondary | ICD-10-CM

## 2018-05-21 DIAGNOSIS — M5030 Other cervical disc degeneration, unspecified cervical region: Secondary | ICD-10-CM

## 2018-05-21 NOTE — Progress Notes (Signed)
Office Visit Note   Patient: Ruth Shah           Date of Birth: Mar 26, 1976           MRN: [id number] Visit Date: 05/21/2018              Requested by: Selinda Flavin, MD [address] [place], [place] [address] PCP: Selinda Flavin, MD   Assessment & Plan: Visit Diagnoses:  1. Right tennis elbow   2. Other cervical disc degeneration, unspecified cervical region     Plan: Tennis elbow splint applied for the right arm.  Pathophysiology discussed.  Currently her neck symptoms come and go sometimes it bothers her in the morning and can can give her a migraine.  If she has increased radicular symptoms she will call let us know.  Follow-Up Instructions: No follow-ups on file.   Orders:  No orders of the defined types were placed in this encounter.  No orders of the defined types were placed in this encounter.     Procedures: No procedures performed   Clinical Data: No additional findings.   Subjective: Chief Complaint  Patient presents with  . 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  She is used ibuprofen and Tylenol without relief.  She is had previous MRI scan cervical spine which showed some mild foraminal narrowing mild disc bulging without cord compression at C5-6 C6-7.  Review of Systems 14 pt  point review of systems updated unchanged from last office visit other than as mentioned in HPI.   Objective: Vital Signs: BP 127/85   Pulse (!) 53   Ht 5\' 3"  (1.6 m)   Wt 172 lb (78 kg)   BMI 30.47 kg/m   Physical Exam  Constitutional: She is oriented to person, place, and time. She appears well-developed.  HENT:  Head: Normocephalic.  Right Ear: External ear normal.  Left Ear: External ear normal.  Eyes: Pupils are equal, round, and reactive to light.  Neck:  No tracheal deviation present. No thyromegaly present.  Cardiovascular: Normal rate.  Pulmonary/Chest: Effort normal.  Abdominal: Soft.  Neurological: She is alert and oriented to person, place, and time.  Skin: Skin is warm and dry.  Psychiatric: She has a normal mood and affect. Her behavior is normal.    Ortho Exam . Mild brachial plexus tenderness right and left mild positive Spurling.  Upper extremity reflexes are 2+ and symmetrical.  Tenderness over the right lateral epicondyle pain with resisted wrist extension on the right negative on the left.  Elbow reaches full extension collateral ligaments are stable no elbow effusion.  Toe gait.  Specialty Comments:  No specialty comments available.  Imaging: No results found.   PMFS History: Patient Active Problem List   Diagnosis Date Noted  . Mild intermittent asthma, uncomplicated 04/24/2018  . Adverse food reaction 04/24/2018  . Perennial allergic rhinitis 04/24/2018  . Allergic urticaria 04/24/2018  . Gastroesophageal reflux disease   . Hiatal hernia   . Hematometra 10/02/2017  . Pelvic pain in female 10/02/2017  . Status post vaginal hysterectomy 10/02/2017  . Rectal bleeding 07/26/2017  . Rectocele, female 06/28/2017  . Uterine prolapse 06/28/2017  . Diarrhea 04/26/2017  . Chronic bilateral low back pain without sciatica 02/15/2017  . Dyspepsia 12/21/2016  .  Dysphagia 12/21/2016  . Nausea without vomiting 12/21/2016  . Chronic migraine 07/20/2016  . Neck pain 07/20/2016  . 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 H/O Genital warts 08/12/2015   Past Medical History:  Diagnosis Date  . ADD (attention deficit disorder)   . Arthritis   . Asthma    mild seasonal reactions to mold and mildew  . Back pain   . Depression   . Fibromyalgia   . GERD (gastroesophageal reflux disease)   . Herpes simplex disease   . History of kidney stones   . Migraines   . Paresthesia of both hands   . PONV (postoperative nausea and vomiting)     . Urticaria     Family History  Problem Relation Age of Onset  . Cancer Mother        skin  . Depression Mother   . Hypertension Mother   . Hyperlipidemia Mother   . Diabetes Mother   . Cancer Father        skin  . Arthritis Father   . Hypertension Father   . Hyperlipidemia Father   . Asthma Brother   . Depression Maternal Grandmother   . Hypertension Maternal Grandmother   . Mental illness Maternal Grandmother   . Cancer Maternal Grandfather        kidney  . Hypertension Maternal Grandfather   . Kidney disease Maternal Grandfather   . Emphysema Paternal Grandmother   . Alzheimer's disease Paternal Grandmother   . Parkinson's disease Paternal Grandfather   . Colon cancer Neg Hx   . Colon polyps Neg Hx   . Allergic rhinitis Neg Hx   . Eczema Neg Hx   . Urticaria Neg Hx     Past Surgical History:  Procedure Laterality Date  . ENDOMETRIAL ABLATION    . ESOPHAGEAL MANOMETRY N/A 10/15/2017   Procedure: ESOPHAGEAL MANOMETRY (EM);  Surgeon: Napoleon FormNandigam, Kavitha V, MD;  Location: [facility];  Service: Endoscopy;  Laterality: N/A;  . ESOPHAGOGASTRODUODENOSCOPY (EGD) WITH PROPOFOL N/A 12/26/2016   Dr. Darrick PennaFields: one benign-appearing, intrinsic stenosis that was widely patent and traversed. Small hiatal hernia, few small sessile polyps. Chronic gastritis. Normal duodenum, negative celiac sprue.   Marland Kitchen. HERNIA REPAIR     inguinal as a baby   . left ovarian removal due to cyst    . PILONIDAL CYST / SINUS EXCISION  2009  . RECTOCELE REPAIR N/A 10/02/2017   Procedure: POSTERIOR REPAIR (RECTOCELE);  Surgeon: Tilda BurrowFerguson, John V, MD;  Location: [facility];  Service: Gynecology;  Laterality: N/A;  . SINOSCOPY    . TUBAL LIGATION    . VAGINAL HYSTERECTOMY N/A 10/02/2017   Procedure: HYSTERECTOMY VAGINAL;  Surgeon: Tilda BurrowFerguson, John V, MD;  Location: [facility];  Service: Gynecology;  Laterality: N/A;   Social History   Occupational History  . Not on file  Tobacco Use  . Smoking status: Never Smoker  .  Smokeless tobacco: Never Used  Substance and Sexual Activity  . Alcohol use: No    Alcohol/week: 0.0 standard drinks  . Drug use: No  . Sexual activity: Yes    Birth control/protection: Surgical    Comment: tubal

## 2018-05-23 ENCOUNTER — Other Ambulatory Visit: Payer: Self-pay | Admitting: Women's Health

## 2018-05-23 MED ORDER — VALACYCLOVIR HCL 1 G PO TABS: 1000.0000 mg | ORAL_TABLET | Freq: Every day | ORAL | 11 refills | Status: DC

## 2018-05-27 ENCOUNTER — Encounter: Payer: Self-pay | Admitting: Allergy & Immunology

## 2018-05-30 MED ORDER — LEVOCETIRIZINE DIHYDROCHLORIDE 5 MG PO TABS: 5.0000 mg | ORAL_TABLET | Freq: Two times a day (BID) | ORAL | 3 refills | Status: DC

## 2018-06-06 ENCOUNTER — Encounter (HOSPITAL_COMMUNITY): Payer: Self-pay

## 2018-06-06 ENCOUNTER — Emergency Department (HOSPITAL_COMMUNITY): Payer: Medicaid Other

## 2018-06-06 ENCOUNTER — Other Ambulatory Visit: Payer: Self-pay

## 2018-06-06 ENCOUNTER — Emergency Department (HOSPITAL_COMMUNITY)
Admission: EM | Admit: 2018-06-06 | Discharge: 2018-06-06 | Disposition: A | Discharge status: Home / Self Care | Payer: Medicaid Other | Attending: Emergency Medicine | Admitting: Emergency Medicine

## 2018-06-06 DIAGNOSIS — J45909 Unspecified asthma, uncomplicated: Secondary | ICD-10-CM | POA: Insufficient documentation

## 2018-06-06 DIAGNOSIS — Z79899 Other long term (current) drug therapy: Secondary | ICD-10-CM | POA: Diagnosis not present

## 2018-06-06 DIAGNOSIS — N83201 Unspecified ovarian cyst, right side: Secondary | ICD-10-CM

## 2018-06-06 DIAGNOSIS — R109 Unspecified abdominal pain: Secondary | ICD-10-CM

## 2018-06-06 DIAGNOSIS — N83291 Other ovarian cyst, right side: Secondary | ICD-10-CM | POA: Diagnosis not present

## 2018-06-06 DIAGNOSIS — R1011 Right upper quadrant pain: Secondary | ICD-10-CM | POA: Diagnosis present

## 2018-06-06 LAB — URINALYSIS, ROUTINE W REFLEX MICROSCOPIC
Bilirubin Urine: NEGATIVE
Glucose, UA: NEGATIVE mg/dL
Ketones, ur: NEGATIVE mg/dL
LEUKOCYTES UA: NEGATIVE
Nitrite: NEGATIVE
PH: 7 (ref 5.0–8.0)
Protein, ur: NEGATIVE mg/dL
RBC / HPF: >50 RBC/hpf — ABNORMAL HIGH (ref 0–5)
SPECIFIC GRAVITY, URINE: 1.019 (ref 1.005–1.030)

## 2018-06-06 MED ORDER — ONDANSETRON 8 MG PO TBDP
ORAL_TABLET | ORAL | Status: AC
  Administered 2018-06-06: 8 mg
  Filled 2018-06-06: qty 1

## 2018-06-06 MED ORDER — OXYCODONE-ACETAMINOPHEN 5-325 MG PO TABS
ORAL_TABLET | ORAL | Status: AC
  Administered 2018-06-06: 1
  Filled 2018-06-06: qty 1

## 2018-06-06 NOTE — ED Provider Notes (Signed)
[facility] EMERGENCY DEPARTMENT Provider Note   CSN: [id number] Arrival date & time: 06/06/18  0055     History   Chief Complaint No chief complaint on file.   HPI Ruth Shah is a 42 y.o. female.  Patient is a 42 year old female presenting with complaints of right flank pain.  She states that this is been present for the past several weeks and is worsening.  She is having difficulty sleeping secondary to her pain.  She denies any fevers or chills.  She denies any bowel or urinary complaints.  She denies any aggravating factors.  She tells me she has not taken anything for her pain.  The history is provided by the patient.    Past Medical History:  Diagnosis Date  . ADD (attention deficit disorder)   . Arthritis   . Asthma    mild seasonal reactions to mold and mildew  . Back pain   . Depression   . Fibromyalgia   . GERD (gastroesophageal reflux disease)   . Herpes simplex disease   . History of kidney stones   . Migraines   . Paresthesia of both hands   . PONV (postoperative nausea and vomiting)   . Urticaria     Patient Active Problem List   Diagnosis Date Noted  . Mild intermittent asthma, uncomplicated 04/24/2018  . Adverse food reaction 04/24/2018  . Perennial allergic rhinitis 04/24/2018  . Allergic urticaria 04/24/2018  . Gastroesophageal reflux disease   . Hiatal hernia   . Hematometra 10/02/2017  . Pelvic pain in female 10/02/2017  . Status post vaginal hysterectomy 10/02/2017  . Rectal bleeding 07/26/2017  . Rectocele, female 06/28/2017  . Uterine prolapse 06/28/2017  . Diarrhea 04/26/2017  . Chronic bilateral low back pain without sciatica 02/15/2017  . Dyspepsia 12/21/2016  . Dysphagia 12/21/2016  . Nausea without vomiting 12/21/2016  . Chronic migraine 07/20/2016  . Neck pain 07/20/2016  . 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 H/O Genital warts 08/12/2015    Past Surgical History:  Procedure Laterality Date  . ENDOMETRIAL ABLATION    .  ESOPHAGEAL MANOMETRY N/A 10/15/2017   Procedure: ESOPHAGEAL MANOMETRY (EM);  Surgeon: Napoleon Form, MD;  Location: [facility];  Service: Endoscopy;  Laterality: N/A;  . ESOPHAGOGASTRODUODENOSCOPY (EGD) WITH PROPOFOL N/A 12/26/2016   Dr. Darrick Penna: one benign-appearing, intrinsic stenosis that was widely patent and traversed. Small hiatal hernia, few small sessile polyps. Chronic gastritis. Normal duodenum, negative celiac sprue.   Marland Kitchen HERNIA REPAIR     inguinal as a baby   . left ovarian removal due to cyst    . PILONIDAL CYST / SINUS EXCISION  2009  . RECTOCELE REPAIR N/A 10/02/2017   Procedure: POSTERIOR REPAIR (RECTOCELE);  Surgeon: Tilda Burrow, MD;  Location: [facility];  Service: Gynecology;  Laterality: N/A;  . SINOSCOPY    . TUBAL LIGATION    . VAGINAL HYSTERECTOMY N/A 10/02/2017   Procedure: HYSTERECTOMY VAGINAL;  Surgeon: Tilda Burrow, MD;  Location: [facility];  Service: Gynecology;  Laterality: N/A;     OB History    Gravida  5   Para  4   Term      Preterm      AB  1   Living  4     SAB  1   TAB      Ectopic      Multiple      Live Births  Home Medications    Prior to Admission medications   Medication Sig Start Date End Date Taking? Authorizing Provider  ADDERALL XR 15 MG 24 hr capsule Take by mouth every morning. 04/26/18   [provider]  cetirizine (ZYRTEC) 10 MG tablet Take 1-2 tablets (10-20 mg total) by mouth 2 (two) times daily. 02/26/18   Alfonse Spruce, MD  levocetirizine (XYZAL) 5 MG tablet Take 1 tablet (5 mg total) by mouth 2 (two) times daily. 05/30/18 06/29/18  Alfonse Spruce, MD  montelukast (SINGULAIR) 10 MG tablet Take 1 tablet (10 mg total) by mouth at bedtime. 02/26/18   Alfonse Spruce, MD  nystatin ointment (MYCOSTATIN) Apply 1 application topically 2 (two) times daily. 05/16/18   Cheral Marker, CNM  pantoprazole (PROTONIX) 40 MG tablet Take 1 tablet (40 mg total) by mouth 2 (two) times daily  before a meal. 12/06/17   Gelene Mink, NP  propranolol ER (INDERAL LA) 60 MG 24 hr capsule Take 60 mg by mouth at bedtime. 12/13/17   [provider]  sertraline (ZOLOFT) 50 MG tablet Take 50 mg by mouth daily.    [provider]  SUMAtriptan (IMITREX) 50 MG tablet Take by mouth as needed.     [provider]  triamcinolone ointment (KENALOG) 0.1 % Apply 1 application topically 2 (two) times daily. 05/16/18   Cheral Marker, CNM  valACYclovir (VALTREX) 1000 MG tablet Take 1 tablet (1,000 mg total) by mouth daily. 05/23/18   Cheral Marker, CNM    Family History Family History  Problem Relation Age of Onset  . Cancer Mother        skin  . Depression Mother   . Hypertension Mother   . Hyperlipidemia Mother   . Diabetes Mother   . Cancer Father        skin  . Arthritis Father   . Hypertension Father   . Hyperlipidemia Father   . Asthma Brother   . Depression Maternal Grandmother   . Hypertension Maternal Grandmother   . Mental illness Maternal Grandmother   . Cancer Maternal Grandfather        kidney  . Hypertension Maternal Grandfather   . Kidney disease Maternal Grandfather   . Emphysema Paternal Grandmother   . Alzheimer's disease Paternal Grandmother   . Parkinson's disease Paternal Grandfather   . Colon cancer Neg Hx   . Colon polyps Neg Hx   . Allergic rhinitis Neg Hx   . Eczema Neg Hx   . Urticaria Neg Hx     Social History Social History   Tobacco Use  . Smoking status: Never Smoker  . Smokeless tobacco: Never Used  Substance Use Topics  . Alcohol use: No    Alcohol/week: 0.0 standard drinks  . Drug use: No     Allergies   Beef-derived products; Dilaudid [hydromorphone hcl]; Other; Sulfa antibiotics; Toradol [ketorolac tromethamine]; and Vicodin [hydrocodone-acetaminophen]   Review of Systems Review of Systems  All other systems reviewed and are negative.    Physical Exam Updated Vital Signs There were no vitals  taken for this visit.  Physical Exam  Constitutional: She is oriented to person, place, and time. She appears well-developed and well-nourished. No distress.  HENT:  Head: Normocephalic and atraumatic.  Neck: Normal range of motion. Neck supple.  Cardiovascular: Normal rate and regular rhythm. Exam reveals no gallop and no friction rub.  No murmur heard. Pulmonary/Chest: Effort normal and breath sounds normal. No respiratory distress. She  has no wheezes.  Abdominal: Soft. Bowel sounds are normal. She exhibits no distension. There is tenderness.  There is mild tenderness in the right flank.  Musculoskeletal: Normal range of motion.  Neurological: She is alert and oriented to person, place, and time.  Skin: Skin is warm and dry. She is not diaphoretic.  Nursing note and vitals reviewed.    ED Treatments / Results  Labs (all labs ordered are listed, but only abnormal results are displayed) Labs Reviewed  URINALYSIS, ROUTINE W REFLEX MICROSCOPIC - Abnormal; Notable for the following components:      Result Value   Hgb urine dipstick MODERATE (*)    RBC / HPF >50 (*)    Bacteria, UA RARE (*)    All other components within normal limits    EKG None  Radiology No results found.  Procedures Procedures (including critical care time)  Medications Ordered in ED Medications  oxyCODONE-acetaminophen (PERCOCET/ROXICET) 5-325 MG per tablet (has no administration in time range)  ondansetron (ZOFRAN-ODT) 8 MG disintegrating tablet (has no administration in time range)     Initial Impression / Assessment and Plan / ED Course  I have reviewed the triage vital signs and the nursing notes.  Pertinent labs & imaging results that were available during my care of the patient were reviewed by me and considered in my medical decision making (see chart for details).  Urinalysis shows microscopic hematuria, however CT scan fails to reveal a kidney stone.  It does show a cystic ovary.  As this  is the only abnormal finding otherwise, I suspcect this is the cause of her pain.  She will be discharged with anti-inflammatories, pain medicine, and follow-up as needed.  Final Clinical Impressions(s) / ED Diagnoses   Final diagnoses:  None    ED Discharge Orders    None       Geoffery Lyonselo, Shyheem Whitham, MD 06/06/18 [phone]

## 2018-06-06 NOTE — ED Notes (Signed)
Pt returned from CT Scan 

## 2018-06-06 NOTE — ED Notes (Signed)
Patient transported to CT 

## 2018-06-06 NOTE — ED Triage Notes (Deleted)
Pt arrived from home c/o right side flank pain. Pt states Hx of renal calculi.

## 2018-06-06 NOTE — ED Triage Notes (Signed)
Pt arrived from home via POV c/o right side flank pain. Pt states a Hx of renal calculi.

## 2018-06-06 NOTE — ED Notes (Signed)
ED Provider at bedside. 

## 2018-06-13 ENCOUNTER — Ambulatory Visit: Payer: Medicaid Other | Admitting: Gastroenterology

## 2018-06-25 ENCOUNTER — Emergency Department (HOSPITAL_COMMUNITY)
Admission: EM | Admit: 2018-06-25 | Discharge: 2018-06-26 | Disposition: A | Discharge status: Home / Self Care | Payer: Medicaid Other | Attending: Emergency Medicine | Admitting: Emergency Medicine

## 2018-06-25 ENCOUNTER — Encounter (HOSPITAL_COMMUNITY): Payer: Self-pay | Admitting: Emergency Medicine

## 2018-06-25 ENCOUNTER — Other Ambulatory Visit: Payer: Self-pay

## 2018-06-25 DIAGNOSIS — Z79899 Other long term (current) drug therapy: Secondary | ICD-10-CM | POA: Insufficient documentation

## 2018-06-25 DIAGNOSIS — N83201 Unspecified ovarian cyst, right side: Secondary | ICD-10-CM | POA: Insufficient documentation

## 2018-06-25 DIAGNOSIS — R109 Unspecified abdominal pain: Secondary | ICD-10-CM

## 2018-06-25 DIAGNOSIS — J4520 Mild intermittent asthma, uncomplicated: Secondary | ICD-10-CM | POA: Diagnosis not present

## 2018-06-25 DIAGNOSIS — R1031 Right lower quadrant pain: Secondary | ICD-10-CM | POA: Diagnosis present

## 2018-06-25 NOTE — ED Triage Notes (Signed)
Pt c/o right flank pain intermittent for several mos with nausea, denies v/d/fever

## 2018-06-26 ENCOUNTER — Emergency Department (HOSPITAL_COMMUNITY): Payer: Medicaid Other

## 2018-06-26 LAB — COMPREHENSIVE METABOLIC PANEL
ALK PHOS: 69 U/L (ref 38–126)
ALT: 19 U/L (ref 0–44)
ANION GAP: 7 (ref 5–15)
AST: 15 U/L (ref 15–41)
Albumin: 4 g/dL (ref 3.5–5.0)
BUN: 14 mg/dL (ref 6–20)
CALCIUM: 9 mg/dL (ref 8.9–10.3)
CO2: 23 mmol/L (ref 22–32)
Chloride: 109 mmol/L (ref 98–111)
Creatinine, Ser: 0.92 mg/dL (ref 0.44–1.00)
GFR calc Af Amer: >60 mL/min (ref >60)
GFR calc non Af Amer: >60 mL/min (ref >60)
Glucose, Bld: 102 mg/dL — ABNORMAL HIGH (ref 70–99)
Potassium: 3.8 mmol/L (ref 3.5–5.1)
SODIUM: 139 mmol/L (ref 135–145)
TOTAL PROTEIN: 7.1 g/dL (ref 6.5–8.1)
Total Bilirubin: 0.9 mg/dL (ref 0.3–1.2)

## 2018-06-26 LAB — CBC WITH DIFFERENTIAL/PLATELET
BASOS ABS: 0 10*3/uL (ref 0.0–0.1)
BASOS PCT: 0 %
Eosinophils Absolute: 0.2 10*3/uL (ref 0.0–0.7)
Eosinophils Relative: 2 %
HEMATOCRIT: 40.2 % (ref 36.0–46.0)
HEMOGLOBIN: 13.6 g/dL (ref 12.0–15.0)
Lymphocytes Relative: 32 %
Lymphs Abs: 3.2 10*3/uL (ref 0.7–4.0)
MCH: 31.1 pg (ref 26.0–34.0)
MCHC: 33.8 g/dL (ref 30.0–36.0)
MCV: 92 fL (ref 78.0–100.0)
MONOS PCT: 7 %
Monocytes Absolute: 0.7 10*3/uL (ref 0.1–1.0)
NEUTROS ABS: 5.9 10*3/uL (ref 1.7–7.7)
NEUTROS PCT: 59 %
Platelets: 299 10*3/uL (ref 150–400)
RBC: 4.37 MIL/uL (ref 3.87–5.11)
RDW: 12.8 % (ref 11.5–15.5)
WBC: 10 10*3/uL (ref 4.0–10.5)

## 2018-06-26 LAB — URINALYSIS, ROUTINE W REFLEX MICROSCOPIC
BACTERIA UA: NONE SEEN
BILIRUBIN URINE: NEGATIVE
Glucose, UA: NEGATIVE mg/dL
KETONES UR: NEGATIVE mg/dL
LEUKOCYTES UA: NEGATIVE
NITRITE: NEGATIVE
Protein, ur: NEGATIVE mg/dL
SPECIFIC GRAVITY, URINE: 1.019 (ref 1.005–1.030)
pH: 6 (ref 5.0–8.0)

## 2018-06-26 MED ORDER — HYDROMORPHONE HCL 1 MG/ML IJ SOLN
0.5000 mg | Freq: Once | INTRAMUSCULAR | Status: AC
  Administered 2018-06-26: 0.5 mg via INTRAMUSCULAR
  Filled 2018-06-26: qty 1

## 2018-06-26 MED ORDER — OXYCODONE-ACETAMINOPHEN 5-325 MG PO TABS
1.0000 | ORAL_TABLET | Freq: Once | ORAL | Status: AC
  Administered 2018-06-26: 1 via ORAL
  Filled 2018-06-26: qty 1

## 2018-06-26 MED ORDER — ONDANSETRON 8 MG PO TBDP
8.0000 mg | ORAL_TABLET | Freq: Once | ORAL | Status: AC
  Administered 2018-06-26: 8 mg via ORAL
  Filled 2018-06-26: qty 1

## 2018-06-26 NOTE — ED Provider Notes (Signed)
[facility] EMERGENCY DEPARTMENT Provider Note   CSN: [id number] Arrival date & time: 06/25/18  2244     History   Chief Complaint Chief Complaint  Patient presents with  . Flank Pain    HPI Ruth Shah is a 42 y.o. female.  HPI   Ruth Shah is a 42 y.o. female who presents to the Emergency Department complaining of persistent right sided flank pain for three months.  She describes the pain is nonradiating and worse with movement.  She was seen here last month for same and pain is not improved since her previous visit.  She states pain is associated with nausea, but denies vomiting, fever, diarrhea, hematuria or dysuria.  Has history of kidney stones but states pain is different.  She has not taken any over-the-counter pain relievers.  No pain, numbness, or weakness to the lower extremities.     Past Medical History:  Diagnosis Date  . ADD (attention deficit disorder)   . Arthritis   . Asthma    mild seasonal reactions to mold and mildew  . Back pain   . Depression   . Fibromyalgia   . GERD (gastroesophageal reflux disease)   . Herpes simplex disease   . History of kidney stones   . Migraines   . Paresthesia of both hands   . PONV (postoperative nausea and vomiting)   . Urticaria     Patient Active Problem List   Diagnosis Date Noted  . Mild intermittent asthma, uncomplicated 04/24/2018  . Adverse food reaction 04/24/2018  . Perennial allergic rhinitis 04/24/2018  . Allergic urticaria 04/24/2018  . Gastroesophageal reflux disease   . Hiatal hernia   . Hematometra 10/02/2017  . Pelvic pain in female 10/02/2017  . Status post vaginal hysterectomy 10/02/2017  . Rectal bleeding 07/26/2017  . Rectocele, female 06/28/2017  . Uterine prolapse 06/28/2017  . Diarrhea 04/26/2017  . Chronic bilateral low back pain without sciatica 02/15/2017  . Dyspepsia 12/21/2016  . Dysphagia 12/21/2016  . Nausea without vomiting 12/21/2016  . Chronic migraine 07/20/2016    . Neck pain 07/20/2016  . 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 H/O Genital warts 08/12/2015    Past Surgical History:  Procedure Laterality Date  . ENDOMETRIAL ABLATION    . ESOPHAGEAL MANOMETRY N/A 10/15/2017   Procedure: ESOPHAGEAL MANOMETRY (EM);  Surgeon: Napoleon Form, MD;  Location: [facility];  Service: Endoscopy;  Laterality: N/A;  . ESOPHAGOGASTRODUODENOSCOPY (EGD) WITH PROPOFOL N/A 12/26/2016   Dr. Darrick Penna: one benign-appearing, intrinsic stenosis that was widely patent and traversed. Small hiatal hernia, few small sessile polyps. Chronic gastritis. Normal duodenum, negative celiac sprue.   Marland Kitchen HERNIA REPAIR     inguinal as a baby   . left ovarian removal due to cyst    . PILONIDAL CYST / SINUS EXCISION  2009  . RECTOCELE REPAIR N/A 10/02/2017   Procedure: POSTERIOR REPAIR (RECTOCELE);  Surgeon: Tilda Burrow, MD;  Location: [facility];  Service: Gynecology;  Laterality: N/A;  . SINOSCOPY    . TUBAL LIGATION    . VAGINAL HYSTERECTOMY N/A 10/02/2017   Procedure: HYSTERECTOMY VAGINAL;  Surgeon: Tilda Burrow, MD;  Location: [facility];  Service: Gynecology;  Laterality: N/A;     OB History    Gravida  5   Para  4   Term      Preterm      AB  1   Living  4     SAB  1   TAB  Ectopic      Multiple      Live Births               Home Medications    Prior to Admission medications   Medication Sig Start Date End Date Taking? Authorizing Provider  ADDERALL XR 15 MG 24 hr capsule Take by mouth every morning. 04/26/18   [provider]  cetirizine (ZYRTEC) 10 MG tablet Take 1-2 tablets (10-20 mg total) by mouth 2 (two) times daily. 02/26/18   Alfonse Spruce, MD  levocetirizine (XYZAL) 5 MG tablet Take 1 tablet (5 mg total) by mouth 2 (two) times daily. 05/30/18 06/29/18  Alfonse Spruce, MD  montelukast (SINGULAIR) 10 MG tablet Take 1 tablet (10 mg total) by mouth at bedtime. 02/26/18   Alfonse Spruce, MD  nystatin ointment (MYCOSTATIN)  Apply 1 application topically 2 (two) times daily. 05/16/18   Cheral Marker, CNM  pantoprazole (PROTONIX) 40 MG tablet Take 1 tablet (40 mg total) by mouth 2 (two) times daily before a meal. 12/06/17   Gelene Mink, NP  propranolol ER (INDERAL LA) 60 MG 24 hr capsule Take 60 mg by mouth at bedtime. 12/13/17   [provider]  sertraline (ZOLOFT) 50 MG tablet Take 50 mg by mouth daily.    [provider]  SUMAtriptan (IMITREX) 50 MG tablet Take by mouth as needed.     [provider]  triamcinolone ointment (KENALOG) 0.1 % Apply 1 application topically 2 (two) times daily. 05/16/18   Cheral Marker, CNM  valACYclovir (VALTREX) 1000 MG tablet Take 1 tablet (1,000 mg total) by mouth daily. 05/23/18   Cheral Marker, CNM    Family History Family History  Problem Relation Age of Onset  . Cancer Mother        skin  . Depression Mother   . Hypertension Mother   . Hyperlipidemia Mother   . Diabetes Mother   . Cancer Father        skin  . Arthritis Father   . Hypertension Father   . Hyperlipidemia Father   . Asthma Brother   . Depression Maternal Grandmother   . Hypertension Maternal Grandmother   . Mental illness Maternal Grandmother   . Cancer Maternal Grandfather        kidney  . Hypertension Maternal Grandfather   . Kidney disease Maternal Grandfather   . Emphysema Paternal Grandmother   . Alzheimer's disease Paternal Grandmother   . Parkinson's disease Paternal Grandfather   . Colon cancer Neg Hx   . Colon polyps Neg Hx   . Allergic rhinitis Neg Hx   . Eczema Neg Hx   . Urticaria Neg Hx     Social History Social History   Tobacco Use  . Smoking status: Never Smoker  . Smokeless tobacco: Never Used  Substance Use Topics  . Alcohol use: No    Alcohol/week: 0.0 standard drinks  . Drug use: No     Allergies   Beef-derived products; Dilaudid [hydromorphone hcl]; Other; Sulfa antibiotics; Toradol [ketorolac tromethamine]; and Vicodin  [hydrocodone-acetaminophen]   Review of Systems Review of Systems  Constitutional: Negative for appetite change, chills and fever.  Respiratory: Negative for shortness of breath.   Cardiovascular: Negative for chest pain.  Gastrointestinal: Positive for nausea. Negative for abdominal pain, constipation and vomiting.  Genitourinary: Positive for flank pain. Negative for decreased urine volume, difficulty urinating, dysuria, hematuria, pelvic pain, vaginal bleeding and vaginal discharge.  Musculoskeletal: Negative for  back pain.  Skin: Negative for rash.  Neurological: Negative for weakness and numbness.     Physical Exam Updated Vital Signs BP (!) 148/106 (BP Location: Right Arm)   Pulse 74   Temp 98.4 F (36.9 C) (Oral)   Resp 18   Ht 5\' 3"  (1.6 m)   Wt 76.7 kg   SpO2 98%   BMI 29.94 kg/m   Physical Exam  Constitutional: She appears well-developed and well-nourished. No distress.  HENT:  Mouth/Throat: Oropharynx is clear and moist.  Cardiovascular: Normal rate, regular rhythm and intact distal pulses.  Pulmonary/Chest: Effort normal and breath sounds normal. No respiratory distress.  Abdominal: Soft. She exhibits no distension and no mass. There is no tenderness. There is no guarding.  Mild ttp of the right flank. No guarding, abd is soft.    Musculoskeletal: Normal range of motion.  Right flank pain reproduced with palpation and internal/ external rotation of the right hip.   Neurological: She is alert. No sensory deficit.  Skin: Skin is warm. Capillary refill takes less than 2 seconds.  Psychiatric: She has a normal mood and affect.  Nursing note and vitals reviewed.    ED Treatments / Results  Labs (all labs ordered are listed, but only abnormal results are displayed) Labs Reviewed  URINALYSIS, ROUTINE W REFLEX MICROSCOPIC - Abnormal; Notable for the following components:      Result Value   Hgb urine dipstick MODERATE (*)    All other components within normal  limits  COMPREHENSIVE METABOLIC PANEL - Abnormal; Notable for the following components:   Glucose, Bld 102 (*)    All other components within normal limits  URINE CULTURE  CBC WITH DIFFERENTIAL/PLATELET    EKG None  Radiology Ct Renal Stone Study  Result Date: 06/26/2018 CLINICAL DATA:  42 y/o F; right flank pain intermittent for several months with nausea. Denies vomiting, diarrhea, fever. EXAM: CT ABDOMEN AND PELVIS WITHOUT CONTRAST TECHNIQUE: Multidetector CT imaging of the abdomen and pelvis was performed following the standard protocol without IV contrast. COMPARISON:  06/06/2018 CT abdomen and pelvis. FINDINGS: Lower chest: Stable small hiatal hernia. Hepatobiliary: Stable subcentimeter cysts within liver segment 4a. No additional focal liver lesion. Few areas of geographic hypoattenuation compatible with steatosis. Normal gallbladder. No biliary ductal dilatation. Pancreas: Unremarkable. No pancreatic ductal dilatation or surrounding inflammatory changes. Spleen: The spleen measures 12.4 x 4.8 x 8.1 cm (volume = 250 cm^3). Adrenals/Urinary Tract: Adrenal glands are unremarkable. Kidneys are normal, without renal calculi, focal lesion, or hydronephrosis. Bladder is unremarkable. Stomach/Bowel: Stomach is within normal limits. Appendix appears normal. No evidence of bowel wall thickening, distention, or inflammatory changes. Vascular/Lymphatic: No significant vascular findings are present. No enlarged abdominal or pelvic lymph nodes. Reproductive: Status post hysterectomy. No adnexal masses. Multiple right ovarian cysts measuring up to 14 mm. Other: No abdominal wall hernia or abnormality. No abdominopelvic ascites. Musculoskeletal: No fracture is seen. Mild lower lumbar spine degenerative changes. IMPRESSION: 1. No acute process identified as explanation for abdominal pain. 2. Stable small hiatal hernia. 3. Stable liver cyst and patchy hepatic steatosis. Electronically Signed   By: Mitzi Hansen M.D.   On: 06/26/2018 00:52    Procedures Procedures (including critical care time)  Medications Ordered in ED Medications  ondansetron (ZOFRAN-ODT) disintegrating tablet 8 mg (has no administration in time range)  oxyCODONE-acetaminophen (PERCOCET/ROXICET) 5-325 MG per tablet 1 tablet (1 tablet Oral Given 06/26/18 0043)     Initial Impression / Assessment and Plan / ED Course  I have reviewed the triage vital signs and the nursing notes.  Pertinent labs & imaging results that were available during my care of the patient were reviewed by me and considered in my medical decision making (see chart for details).     Pt is well appearing.  Three month hx of localized right flank pain.  Seen here last month for same.  No fever, vomiting or abdominal pain.  I do not feel that sx's are related to torsion or infectious process.  She continues to have some hematuria and persistent cystic ovary which may be source of her pain.  I have discussed the need for GYN f/u.  She agrees to plan and has pain medication and antiemetic at home.    She appears appropriate for d/c home, hypertensive, but has not taken her propranolol tonight, agrees to take her medication when she returns home. Denies chest pain, SOB, headache or visual changes.  Return precautions were discussed.  Final Clinical Impressions(s) / ED Diagnoses   Final diagnoses:  Right flank pain  Right ovarian cyst    ED Discharge Orders    None       Pauline Aus, PA-C 06/26/18 0151    Glynn Octave, MD 06/26/18 [phone]

## 2018-06-26 NOTE — Discharge Instructions (Addendum)
Call your gynecologist tomorrow to arrange a follow-up appointment.  Try taking your nausea medicine 15 to 20 minutes before taking pain medication.

## 2018-06-27 LAB — URINE CULTURE

## 2018-07-03 ENCOUNTER — Encounter (INDEPENDENT_AMBULATORY_CARE_PROVIDER_SITE_OTHER): Payer: Self-pay | Admitting: Orthopaedic Surgery

## 2018-07-03 ENCOUNTER — Ambulatory Visit (INDEPENDENT_AMBULATORY_CARE_PROVIDER_SITE_OTHER): Payer: Medicaid Other | Admitting: Orthopaedic Surgery

## 2018-07-03 VITALS — BP 145/94 | HR 78 | Ht 63.0 in | Wt 165.0 lb

## 2018-07-03 DIAGNOSIS — M25551 Pain in right hip: Secondary | ICD-10-CM | POA: Diagnosis not present

## 2018-07-03 DIAGNOSIS — M5126 Other intervertebral disc displacement, lumbar region: Secondary | ICD-10-CM | POA: Diagnosis not present

## 2018-07-03 NOTE — Progress Notes (Signed)
Office Visit Note/orthopedic consultation requested by Maylon Cos, rheumatology   Patient: Ruth Shah           Date of Birth: 08/18/1976           MRN: [id number] Visit Date: 07/03/2018              Requested by: Selinda Flavin, MD [address] [place], [place] [address] PCP: Selinda Flavin, MD   Assessment & Plan: Visit Diagnoses:  1. Pain in right hip   2. Protrusion of lumbar intervertebral disc         Right L5-S1 by 2018 lumbar MRI  Plan: Patient symptoms may be related to her right L5-S1 disc protrusion that was noted on MRI July 2018.  She may have some mild hip tendinitis but more likely her symptoms may be related to her lumbar spine.  Currently her symptoms are not severe enough to consider further intervention.  We discussed a wellness program with some exercising starting with using the treadmill and then work on some core strengthening.  If she has increase in her symptoms she can return we can consider repeating her MRI scan.  Currently she remains neurologically intact.  Thank you the opportunity to see her in consultation.  Follow-Up Instructions: Return if symptoms worsen or fail to improve.   Orders:  No orders of the defined types were placed in this encounter.  No orders of the defined types were placed in this encounter.     Procedures: No procedures performed   Clinical Data: No additional findings.   Subjective: Chief Complaint  Patient presents with  . Left Hip - Pain    HPI 42 year old female seen with left anterior hip pain.  She had previous x-rays obtained which showed left os acetabuli, she is been seen by rheumatology.  She is also had some mild right flank pain been to the emergency room x2 with CT scans which she states were negative.  She denies associated bowel or bladder symptoms no rash or fever.  X-ray showed no fracture , no degenerative changes in the hip.  Patient did have positive ANA.  She has had a history of kidney stones.  CT  scan showed some cystic ovary changes.  Incidental liver cyst with patchy hepatic steatosis.  Patient is taken anti-inflammatories, Flexeril without relief.  She is used Topamax and Imitrex for chronic migraine problems.  04/03/2017 lumbar MRI showed shallow right eccentric disc protrusion at L5-S1 and mild facet hypertrophy at L4-5 otherwise negative.  Review of Systems 14 point review of system positive for fatigue with activities.  CV respiratory negative.  GU positive for history of kidney stones.  Hip pain intermittent and rated at moderate at times.  No dysuria hematuria.  Otherwise negative as it pertains HPI.   Objective: Vital Signs: BP (!) 145/94   Pulse 78   Ht 5\' 3"  (1.6 m)   Wt 165 lb (74.8 kg)   BMI 29.23 kg/m   Physical Exam  Constitutional: She is oriented to person, place, and time. She appears well-developed.  HENT:  Head: Normocephalic.  Right Ear: External ear normal.  Left Ear: External ear normal.  Eyes: Pupils are equal, round, and reactive to light.  Neck: No tracheal deviation present. No thyromegaly present.  Cardiovascular: Normal rate.  Pulmonary/Chest: Effort normal.  Abdominal: Soft.  Neurological: She is alert and oriented to person, place, and time.  Skin: Skin is warm and dry.  Psychiatric: She has a normal mood  and affect. Her behavior is normal.    Ortho Exam patient has mild discomfort with internal rotation right hip which is symmetrical with the left.  No hip flexion weakness quads are strong knee and ankle jerk are intact heel and toe walking is normal.  Negative Trendelenburg gait.  Distal pulses are 2+ and symmetrical no plantar foot lesions.  Mild trochanteric bursal tenderness no sciatic notch tenderness negative straight leg raising 90 degrees.  Specialty Comments:  No specialty comments available.  Imaging: No results found.   PMFS History: Patient Active Problem List   Diagnosis Date Noted  . Mild intermittent asthma, uncomplicated  04/24/2018  . Adverse food reaction 04/24/2018  . Perennial allergic rhinitis 04/24/2018  . Allergic urticaria 04/24/2018  . Gastroesophageal reflux disease   . Hiatal hernia   . Hematometra 10/02/2017  . Pelvic pain in female 10/02/2017  . Status post vaginal hysterectomy 10/02/2017  . Rectal bleeding 07/26/2017  . Rectocele, female 06/28/2017  . Uterine prolapse 06/28/2017  . Diarrhea 04/26/2017  . Chronic bilateral low back pain without sciatica 02/15/2017  . Dyspepsia 12/21/2016  . Dysphagia 12/21/2016  . Nausea without vomiting 12/21/2016  . Chronic migraine 07/20/2016  . Neck pain 07/20/2016  . 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 H/O Genital warts 08/12/2015   Past Medical History:  Diagnosis Date  . ADD (attention deficit disorder)   . Arthritis   . Asthma    mild seasonal reactions to mold and mildew  . Back pain   . Depression   . Fibromyalgia   . GERD (gastroesophageal reflux disease)   . Herpes simplex disease   . History of kidney stones   . Migraines   . Paresthesia of both hands   . PONV (postoperative nausea and vomiting)   . Urticaria     Family History  Problem Relation Age of Onset  . Cancer Mother        skin  . Depression Mother   . Hypertension Mother   . Hyperlipidemia Mother   . Diabetes Mother   . Cancer Father        skin  . Arthritis Father   . Hypertension Father   . Hyperlipidemia Father   . Asthma Brother   . Depression Maternal Grandmother   . Hypertension Maternal Grandmother   . Mental illness Maternal Grandmother   . Cancer Maternal Grandfather        kidney  . Hypertension Maternal Grandfather   . Kidney disease Maternal Grandfather   . Emphysema Paternal Grandmother   . Alzheimer's disease Paternal Grandmother   . Parkinson's disease Paternal Grandfather   . Colon cancer Neg Hx   . Colon polyps Neg Hx   . Allergic rhinitis Neg Hx   . Eczema Neg Hx   . Urticaria Neg Hx     Past Surgical History:  Procedure Laterality  Date  . ENDOMETRIAL ABLATION    . ESOPHAGEAL MANOMETRY N/A 10/15/2017   Procedure: ESOPHAGEAL MANOMETRY (EM);  Surgeon: Napoleon Form, MD;  Location: [facility];  Service: Endoscopy;  Laterality: N/A;  . ESOPHAGOGASTRODUODENOSCOPY (EGD) WITH PROPOFOL N/A 12/26/2016   Dr. Darrick Penna: one benign-appearing, intrinsic stenosis that was widely patent and traversed. Small hiatal hernia, few small sessile polyps. Chronic gastritis. Normal duodenum, negative celiac sprue.   Marland Kitchen HERNIA REPAIR     inguinal as a baby   . left ovarian removal due to cyst    . PILONIDAL CYST / SINUS EXCISION  2009  . RECTOCELE REPAIR N/A  10/02/2017   Procedure: POSTERIOR REPAIR (RECTOCELE);  Surgeon: Tilda Burrow, MD;  Location: [facility];  Service: Gynecology;  Laterality: N/A;  . SINOSCOPY    . TUBAL LIGATION    . VAGINAL HYSTERECTOMY N/A 10/02/2017   Procedure: HYSTERECTOMY VAGINAL;  Surgeon: Tilda Burrow, MD;  Location: [facility];  Service: Gynecology;  Laterality: N/A;   Social History   Occupational History  . Not on file  Tobacco Use  . Smoking status: Never Smoker  . Smokeless tobacco: Never Used  Substance and Sexual Activity  . Alcohol use: No    Alcohol/week: 0.0 standard drinks  . Drug use: No  . Sexual activity: Yes    Birth control/protection: Surgical    Comment: tubal

## 2018-07-05 ENCOUNTER — Encounter (INDEPENDENT_AMBULATORY_CARE_PROVIDER_SITE_OTHER): Payer: Self-pay | Admitting: Orthopaedic Surgery

## 2018-07-09 ENCOUNTER — Ambulatory Visit: Payer: Medicaid Other | Admitting: Obstetrics and Gynecology

## 2018-07-18 ENCOUNTER — Encounter: Payer: Self-pay | Admitting: Obstetrics and Gynecology

## 2018-07-18 ENCOUNTER — Ambulatory Visit: Payer: Medicaid Other | Admitting: Obstetrics and Gynecology

## 2018-07-18 VITALS — BP 120/80 | HR 69 | Ht 63.0 in | Wt 168.4 lb

## 2018-07-18 DIAGNOSIS — R319 Hematuria, unspecified: Secondary | ICD-10-CM

## 2018-07-18 LAB — POCT URINALYSIS DIPSTICK OB
GLUCOSE, UA: NEGATIVE
KETONES UA: NEGATIVE
Leukocytes, UA: NEGATIVE
Nitrite, UA: NEGATIVE
POC,PROTEIN,UA: NEGATIVE
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
## 2018-09-03 ENCOUNTER — Encounter: Payer: Self-pay | Admitting: Nurse Practitioner

## 2018-09-03 ENCOUNTER — Ambulatory Visit: Payer: Medicaid Other | Admitting: Nurse Practitioner

## 2018-09-03 VITALS — BP 135/91 | HR 80 | Temp 97.9°F | Ht 63.0 in | Wt 166.8 lb

## 2018-09-03 DIAGNOSIS — K219 Gastro-esophageal reflux disease without esophagitis: Secondary | ICD-10-CM | POA: Diagnosis not present

## 2018-09-03 DIAGNOSIS — K6289 Other specified diseases of anus and rectum: Secondary | ICD-10-CM | POA: Insufficient documentation

## 2018-09-03 DIAGNOSIS — R109 Unspecified abdominal pain: Secondary | ICD-10-CM | POA: Diagnosis not present

## 2018-09-03 MED ORDER — PANTOPRAZOLE SODIUM 40 MG PO TBEC: 40.0000 mg | DELAYED_RELEASE_TABLET | Freq: Two times a day (BID) | ORAL | 3 refills | Status: DC

## 2018-09-03 NOTE — Patient Instructions (Signed)
1. Have your blood test completed when you are able to. 2. We will help schedule your ultrasound for you. 3. I have increased your Protonix to twice a day again. 4. Follow-up in 2 months. 5. Call us if you have any questions or concerns.  At [facility] we value your feedback. You may receive a survey about your visit today. Please share your experience as we strive to create trusting relationships with our patients to provide genuine, compassionate, quality care.  We appreciate your understanding and patience as we review any laboratory studies, imaging, and other diagnostic tests that are ordered as we care for you. Our office policy is 5 business days for review of these results, and any emergent or urgent results are addressed in a timely manner for your best interest. If you do not hear from our office in 1 week, please contact us.   We also encourage the use of MyChart, which contains your medical information for your review as well. If you are not enrolled in this feature, an access code is on this after visit summary for your convenience. Thank you for allowing us to be involved in your care.  It was great to see you today!  I hope you have a Merry Christmas!!

## 2018-09-03 NOTE — Assessment & Plan Note (Signed)
The patient has been seen by multiple physicians in the emergency room including imaging with CT x2 for right sided flank pain.  No significant etiology found.  Has a significant family history of biliary disorders.  HIDA scan in 2018 was essentially normal.  This point we will check a right upper quadrant ultrasound for change in known liver cyst.  Also check for changes in gallbladder, common bile duct, possible cholelithiasis.  Will check CBC and CMP as well.  Possible overlay with GERD symptoms with right upper quadrant pain.  We will increase her Protonix to twice a day as she was previously on.  Follow-up in 2 months.

## 2018-09-03 NOTE — Patient Instructions (Signed)
US abd RUQ approved via [employer] website. PA# [id number], 09/03/18-10/03/18.

## 2018-09-03 NOTE — Progress Notes (Signed)
cc'ed to pcp °

## 2018-09-03 NOTE — Progress Notes (Signed)
Referring Provider: Selinda FlavinHoward, Kevin, MD Primary Care Physician:  Selinda FlavinHoward, Kevin, MD Primary GI:  Dr. Darrick PennaFields  Chief Complaint  Patient presents with  . Gastroesophageal Reflux    f/u. PCP changed protonix to QD x 2 months and feels she needs to go back to BID     HPI:   Ruth Shah is a 42 y.o. female who presents for follow-up on GERD.  Patient was last seen in our office 12/06/2017.  Noted history of GERD, dysphasia, abdominal pain, diarrhea.  History of EGD with a single benign-appearing intrinsic stenosis that was widely patent and traversed.  Small hiatal hernia, few small sessile polyps.  Chronic gastritis.  HIDA scan normal.  History of esophageal dysmotility.  At her last visit she states she sometimes forgets to take Protonix.  Is prescribed twice daily.  No diarrhea unless she eats something greasy.  No abdominal pain.  Scant blood on 2 occasions in the remote past.  Recommended call if any further bleeding or concerns.  Follow-up in 6 to 8 months otherwise.  Today she states she's doing ok overall. Her Protonix was changed to once daily in error by PCP (meant to change Topamax to once daily). GERD symptoms worse on once daily PPI. Symptoms include acid reflux/regurgitation, bitter taste. Occasional swallowing issues first thing in the morning and then none later in the day; typically only has issues if she eats too fast and doesn't chew well enough. Has been having right flank pain for 4 months, has been evaluated by ED and "every doctor she sees". Has had two CTs with no significant problems. Does have history of biliary disease in her family. When she has her pain, will often have diarrhea with that. Will at times have rectal pain which is rare. Denies any hematochezia, melena. Denies N/V. Denies chest pain, dyspnea, dizziness, lightheadedness, syncope, near syncope. Denies any other upper or lower GI symptoms.  Past Medical History:  Diagnosis Date  . ADD (attention deficit  disorder)   . Arthritis   . Asthma    mild seasonal reactions to mold and mildew  . Back pain   . Depression   . Fibromyalgia   . GERD (gastroesophageal reflux disease)   . Herpes simplex disease   . History of kidney stones   . Migraines   . Paresthesia of both hands   . PONV (postoperative nausea and vomiting)   . Urticaria     Past Surgical History:  Procedure Laterality Date  . ENDOMETRIAL ABLATION    . ESOPHAGEAL MANOMETRY N/A 10/15/2017   Procedure: ESOPHAGEAL MANOMETRY (EM);  Surgeon: Napoleon FormNandigam, Kavitha V, MD;  Location: [facility];  Service: Endoscopy;  Laterality: N/A;  . ESOPHAGOGASTRODUODENOSCOPY (EGD) WITH PROPOFOL N/A 12/26/2016   Dr. Darrick PennaFields: one benign-appearing, intrinsic stenosis that was widely patent and traversed. Small hiatal hernia, few small sessile polyps. Chronic gastritis. Normal duodenum, negative celiac sprue.   Marland Kitchen. HERNIA REPAIR     inguinal as a baby   . left ovarian removal due to cyst    . PILONIDAL CYST / SINUS EXCISION  2009  . RECTOCELE REPAIR N/A 10/02/2017   Procedure: POSTERIOR REPAIR (RECTOCELE);  Surgeon: Tilda BurrowFerguson, John V, MD;  Location: [facility];  Service: Gynecology;  Laterality: N/A;  . SINOSCOPY    . TUBAL LIGATION    . VAGINAL HYSTERECTOMY N/A 10/02/2017   Procedure: HYSTERECTOMY VAGINAL;  Surgeon: Tilda BurrowFerguson, John V, MD;  Location: [facility];  Service: Gynecology;  Laterality: N/A;  Current Outpatient Medications  Medication Sig Dispense Refill  . ADDERALL XR 15 MG 24 hr capsule Take 20 mg by mouth every morning.   0  . Cholecalciferol (VITAMIN D-3) 125 MCG (5000 UT) TABS Take by mouth daily.    . pantoprazole (PROTONIX) 40 MG tablet Take 1 tablet (40 mg total) by mouth 2 (two) times daily before a meal. 180 tablet 3  . propranolol ER (INDERAL LA) 60 MG 24 hr capsule Take 60 mg by mouth at bedtime.  2  . sertraline (ZOLOFT) 50 MG tablet Take 50 mg by mouth daily.    . SUMAtriptan (IMITREX) 50 MG tablet Take by mouth as needed.     .  topiramate (TOPAMAX) 50 MG tablet Take 50 mg by mouth daily.     No current facility-administered medications for this visit.     Allergies as of 09/03/2018 - Review Complete 09/03/2018  Allergen Reaction Noted  . Beef-derived products  03/24/2018  . Dilaudid [hydromorphone hcl] Nausea And Vomiting 10/10/2017  . Duloxetine Other (See Comments) 06/13/2018  . Other Hives 05/16/2018  . Sulfa antibiotics Nausea Only 07/20/2016  . Toradol [ketorolac tromethamine] Nausea Only 07/20/2016  . Vicodin [hydrocodone-acetaminophen] Itching 08/12/2015    Family History  Problem Relation Age of Onset  . Cancer Mother        skin  . Depression Mother   . Hypertension Mother   . Hyperlipidemia Mother   . Diabetes Mother   . Cancer Father        skin  . Arthritis Father   . Hypertension Father   . Hyperlipidemia Father   . Asthma Brother   . Depression Maternal Grandmother   . Hypertension Maternal Grandmother   . Mental illness Maternal Grandmother   . Cancer Maternal Grandfather        kidney  . Hypertension Maternal Grandfather   . Kidney disease Maternal Grandfather   . Emphysema Paternal Grandmother   . Alzheimer's disease Paternal Grandmother   . Parkinson's disease Paternal Grandfather   . Colon cancer Neg Hx   . Colon polyps Neg Hx   . Allergic rhinitis Neg Hx   . Eczema Neg Hx   . Urticaria Neg Hx     Social History   Socioeconomic History  . Marital status: Married    Spouse name: Not on file  . Number of children: 4  . Years of education: Some College  . Highest education level: Not on file  Occupational History  . Not on file  Social Needs  . Financial resource strain: Not on file  . Food insecurity:    Worry: Not on file    Inability: Not on file  . Transportation needs:    Medical: Not on file    Non-medical: Not on file  Tobacco Use  . Smoking status: Never Smoker  . Smokeless tobacco: Never Used  Substance and Sexual Activity  . Alcohol use: No     Alcohol/week: 0.0 standard drinks  . Drug use: No  . Sexual activity: Yes    Birth control/protection: Surgical    Comment: hyst  Lifestyle  . Physical activity:    Days per week: Not on file    Minutes per session: Not on file  . Stress: Not on file  Relationships  . Social connections:    Talks on phone: Not on file    Gets together: Not on file    Attends religious service: Not on file    Active member of  club or organization: Not on file    Attends meetings of clubs or organizations: Not on file    Relationship status: Not on file  Other Topics Concern  . Not on file  Social History Narrative   Lives at home with husband and children.   Right-handed.   4 cups caffeine per week.    Review of Systems: Complete ROS negative except as per HPI.   Physical Exam: BP (!) 135/91   Pulse 80   Temp 97.9 F (36.6 C) (Oral)   Ht 5\' 3"  (1.6 m)   Wt 166 lb 12.8 oz (75.7 kg)   BMI 29.55 kg/m  General:   Alert and oriented. Pleasant and cooperative. Well-nourished and well-developed.  Eyes:  Without icterus, sclera clear and conjunctiva pink.  Ears:  Normal auditory acuity. Cardiovascular:  S1, S2 present without murmurs appreciated. Extremities without clubbing or edema. Respiratory:  Clear to auscultation bilaterally. No wheezes, rales, or rhonchi. No distress.  Gastrointestinal:  +BS, soft, and non-distended. Mild to moderate lower abdominal TTP, epigastric TTP, RUQ TTP. No HSM noted. No guarding or rebound. No masses appreciated.  Rectal:  Deferred  Musculoskalatal:  Symmetrical without gross deformities. Neurologic:  Alert and oriented x4;  grossly normal neurologically. Psych:  Alert and cooperative. Normal mood and affect. Heme/Lymph/Immune: No excessive bruising noted.    09/03/2018 11:40 AM   Disclaimer: This note was dictated with voice recognition software. Similar sounding words can inadvertently be transcribed and may not be corrected upon review.

## 2018-09-03 NOTE — Assessment & Plan Note (Signed)
Worsening GERD symptoms with change in PPI to once daily. Recommended change back to twice daily. Follow-up in 2 months.

## 2018-09-03 NOTE — Assessment & Plan Note (Signed)
Described intermittent, occasional rectal pain. No hematochezia. Symptoms are not frequent. Recommend monitor symptoms and follow-up in 2 months. Will check CBC, CMP.

## 2018-09-04 LAB — CBC WITH DIFFERENTIAL/PLATELET
BASOS ABS: 41 {cells}/uL (ref 0–200)
Basophils Relative: 0.5 %
EOS ABS: 130 {cells}/uL (ref 15–500)
Eosinophils Relative: 1.6 %
HEMATOCRIT: 39.7 % (ref 35.0–45.0)
Hemoglobin: 13.3 g/dL (ref 11.7–15.5)
LYMPHS ABS: 2049 {cells}/uL (ref 850–3900)
MCH: 30.2 pg (ref 27.0–33.0)
MCHC: 33.5 g/dL (ref 32.0–36.0)
MCV: 90.2 fL (ref 80.0–100.0)
MPV: 10.4 fL (ref 7.5–12.5)
Monocytes Relative: 7.3 %
NEUTROS PCT: 65.3 %
Neutro Abs: 5289 cells/uL (ref 1500–7800)
Platelets: 311 10*3/uL (ref 140–400)
RBC: 4.4 10*6/uL (ref 3.80–5.10)
RDW: 11.7 % (ref 11.0–15.0)
Total Lymphocyte: 25.3 %
WBC: 8.1 10*3/uL (ref 3.8–10.8)
WBCMIX: 591 {cells}/uL (ref 200–950)

## 2018-09-04 LAB — COMPREHENSIVE METABOLIC PANEL
AG Ratio: 1.6 (calc) (ref 1.0–2.5)
ALT: 11 U/L (ref 6–29)
AST: 10 U/L (ref 10–30)
Albumin: 4.1 g/dL (ref 3.6–5.1)
Alkaline phosphatase (APISO): 70 U/L (ref 33–115)
BUN: 12 mg/dL (ref 7–25)
CO2: 27 mmol/L (ref 20–32)
Calcium: 9.2 mg/dL (ref 8.6–10.2)
Chloride: 104 mmol/L (ref 98–110)
Creat: 0.81 mg/dL (ref 0.50–1.10)
GLUCOSE: 83 mg/dL (ref 65–139)
Globulin: 2.5 g/dL (calc) (ref 1.9–3.7)
POTASSIUM: 4.1 mmol/L (ref 3.5–5.3)
Sodium: 139 mmol/L (ref 135–146)
Total Bilirubin: 0.7 mg/dL (ref 0.2–1.2)
Total Protein: 6.6 g/dL (ref 6.1–8.1)

## 2018-09-10 ENCOUNTER — Ambulatory Visit: Payer: Medicaid Other | Admitting: Nurse Practitioner

## 2018-09-10 ENCOUNTER — Ambulatory Visit (HOSPITAL_COMMUNITY): Payer: Medicaid Other

## 2018-09-26 ENCOUNTER — Ambulatory Visit (HOSPITAL_COMMUNITY)
Admission: RE | Admit: 2018-09-26 | Discharge: 2018-09-26 | Disposition: A | Discharge status: Home / Self Care | Payer: Medicaid Other | Source: Ambulatory Visit | Attending: Nurse Practitioner | Admitting: Nurse Practitioner

## 2018-09-26 DIAGNOSIS — K219 Gastro-esophageal reflux disease without esophagitis: Secondary | ICD-10-CM

## 2018-09-26 DIAGNOSIS — K6289 Other specified diseases of anus and rectum: Secondary | ICD-10-CM

## 2018-09-26 DIAGNOSIS — R109 Unspecified abdominal pain: Secondary | ICD-10-CM

## 2018-10-07 NOTE — Progress Notes (Signed)
She said her problem has been resolving and is better. However, she was just diagnosed with flu this morning. She will let us know if she has other questions or concerns for [place] to address.

## 2018-10-07 NOTE — Progress Notes (Signed)
To EG 

## 2018-10-22 ENCOUNTER — Ambulatory Visit: Payer: Medicaid Other | Admitting: Allergy & Immunology

## 2018-10-30 ENCOUNTER — Ambulatory Visit: Payer: Medicaid Other | Admitting: Allergy & Immunology

## 2018-11-21 ENCOUNTER — Ambulatory Visit: Payer: Medicaid Other | Admitting: Nurse Practitioner

## 2018-11-21 ENCOUNTER — Encounter: Payer: Self-pay | Admitting: Nurse Practitioner

## 2018-11-21 VITALS — BP 151/101 | HR 81 | Temp 98.2°F | Ht 63.0 in | Wt 173.2 lb

## 2018-11-21 DIAGNOSIS — K219 Gastro-esophageal reflux disease without esophagitis: Secondary | ICD-10-CM

## 2018-11-21 DIAGNOSIS — R109 Unspecified abdominal pain: Secondary | ICD-10-CM | POA: Diagnosis not present

## 2018-11-21 NOTE — Progress Notes (Signed)
Referring Provider: Selinda Flavin, MD Primary Care Physician:  Selinda Flavin, MD Primary GI:  Dr. Darrick Penna  Chief Complaint  Patient presents with  . Gastroesophageal Reflux    f/u, doing ok    HPI:   Ruth Shah is a 43 y.o. female who presents for follow-up on GERD.  Patient was last seen in our office 09/03/2018 for the same as well as abdominal pain and rectal pain.  Noted history of GERD, dysphagia, abdominal pain, diarrhea.  EGD on file for benign-appearing intrinsic stenosis that was widely patent and traversed.  Normal HIDA scan on file.  At her last visit she notes her primary care changed her Protonix to once daily and symptoms became worse.  Dysphagia symptoms worsening in the morning and only if she eats too fast and does not show enough.  Right flank pain for 4 months extensively evaluated with multiple providers and 2 CTs without significant problems.  With her pain comes diarrhea.  Rare rectal pain.  No other GI symptoms.  Recommended follow-up lab work, right upper quadrant ultrasound, increase Protonix to twice a day, follow-up in 2 months.  CBC and CMP completed 09/03/2018 normal.  Right upper quadrant ultrasound completed 09/26/2018 without acute findings, normal gallbladder, no CBD dilation.  Noted hepatic steatosis.  Today she states she's doing better on bid PPI. Denies breakthrough symptoms. Side pain has resolved. Does have some nausea depending on diet (especially high-fat dairy). Denies abdominal pain, vomiting, hematochezia, melena. Denies chest pain, dyspnea, dizziness, lightheadedness, syncope, near syncope. Denies any other upper or lower GI symptoms.  Past Medical History:  Diagnosis Date  . ADD (attention deficit disorder)   . Arthritis   . Asthma    mild seasonal reactions to mold and mildew  . Back pain   . Depression   . Fibromyalgia   . GERD (gastroesophageal reflux disease)   . Herpes simplex disease   . History of kidney stones   . Migraines     . Paresthesia of both hands   . PONV (postoperative nausea and vomiting)   . Urticaria     Past Surgical History:  Procedure Laterality Date  . ENDOMETRIAL ABLATION    . ESOPHAGEAL MANOMETRY N/A 10/15/2017   Procedure: ESOPHAGEAL MANOMETRY (EM);  Surgeon: Napoleon Form, MD;  Location: [facility];  Service: Endoscopy;  Laterality: N/A;  . ESOPHAGOGASTRODUODENOSCOPY (EGD) WITH PROPOFOL N/A 12/26/2016   Dr. Darrick Penna: one benign-appearing, intrinsic stenosis that was widely patent and traversed. Small hiatal hernia, few small sessile polyps. Chronic gastritis. Normal duodenum, negative celiac sprue.   Marland Kitchen HERNIA REPAIR     inguinal as a baby   . left ovarian removal due to cyst    . PILONIDAL CYST / SINUS EXCISION  2009  . RECTOCELE REPAIR N/A 10/02/2017   Procedure: POSTERIOR REPAIR (RECTOCELE);  Surgeon: Tilda Burrow, MD;  Location: [facility];  Service: Gynecology;  Laterality: N/A;  . SINOSCOPY    . TUBAL LIGATION    . VAGINAL HYSTERECTOMY N/A 10/02/2017   Procedure: HYSTERECTOMY VAGINAL;  Surgeon: Tilda Burrow, MD;  Location: [facility];  Service: Gynecology;  Laterality: N/A;    Current Outpatient Medications  Medication Sig Dispense Refill  . ADDERALL XR 15 MG 24 hr capsule Take 20 mg by mouth every morning.   0  . Cholecalciferol (VITAMIN D-3) 125 MCG (5000 UT) TABS Take by mouth daily.    Marland Kitchen FLUoxetine (PROZAC) 20 MG capsule Take 20 mg by mouth daily.    Marland Kitchen  pantoprazole (PROTONIX) 40 MG tablet Take 1 tablet (40 mg total) by mouth 2 (two) times daily before a meal. 180 tablet 3  . SUMAtriptan (IMITREX) 50 MG tablet Take by mouth as needed.      No current facility-administered medications for this visit.     Allergies as of 11/21/2018 - Review Complete 11/21/2018  Allergen Reaction Noted  . Beef-derived products  03/24/2018  . Dilaudid [hydromorphone hcl] Nausea And Vomiting 10/10/2017  . Duloxetine Other (See Comments) 06/13/2018  . Other Hives 05/16/2018  . Sulfa  antibiotics Nausea Only 07/20/2016  . Toradol [ketorolac tromethamine] Nausea Only 07/20/2016  . Vicodin [hydrocodone-acetaminophen] Itching 08/12/2015    Family History  Problem Relation Age of Onset  . Cancer Mother        skin  . Depression Mother   . Hypertension Mother   . Hyperlipidemia Mother   . Diabetes Mother   . Cancer Father        skin  . Arthritis Father   . Hypertension Father   . Hyperlipidemia Father   . Asthma Brother   . Depression Maternal Grandmother   . Hypertension Maternal Grandmother   . Mental illness Maternal Grandmother   . Cancer Maternal Grandfather        kidney  . Hypertension Maternal Grandfather   . Kidney disease Maternal Grandfather   . Emphysema Paternal Grandmother   . Alzheimer's disease Paternal Grandmother   . Parkinson's disease Paternal Grandfather   . Colon cancer Neg Hx   . Colon polyps Neg Hx   . Allergic rhinitis Neg Hx   . Eczema Neg Hx   . Urticaria Neg Hx     Social History   Socioeconomic History  . Marital status: Married    Spouse name: Not on file  . Number of children: 4  . Years of education: Some College  . Highest education level: Not on file  Occupational History  . Not on file  Social Needs  . Financial resource strain: Not on file  . Food insecurity:    Worry: Not on file    Inability: Not on file  . Transportation needs:    Medical: Not on file    Non-medical: Not on file  Tobacco Use  . Smoking status: Never Smoker  . Smokeless tobacco: Never Used  Substance and Sexual Activity  . Alcohol use: No    Alcohol/week: 0.0 standard drinks  . Drug use: No  . Sexual activity: Yes    Birth control/protection: Surgical    Comment: hyst  Lifestyle  . Physical activity:    Days per week: Not on file    Minutes per session: Not on file  . Stress: Not on file  Relationships  . Social connections:    Talks on phone: Not on file    Gets together: Not on file    Attends religious service: Not on  file    Active member of club or organization: Not on file    Attends meetings of clubs or organizations: Not on file    Relationship status: Not on file  Other Topics Concern  . Not on file  Social History Narrative   Lives at home with husband and children.   Right-handed.   4 cups caffeine per week.    Review of Systems: Complete ROS negative except as per HPI.   Physical Exam: BP (!) 151/101   Pulse 81   Temp 98.2 F (36.8 C) (Oral)   Ht  (  1.6 m)   Wt 173 lb 3.2 oz (78.6 kg)   BMI 30.68 kg/m  General:   Alert and oriented. Pleasant and cooperative. Well-nourished and well-developed.  Eyes:  Without icterus, sclera clear and conjunctiva pink.  Ears:  Normal auditory acuity. Cardiovascular:  S1, S2 present without murmurs appreciated. Extremities without clubbing or edema. Respiratory:  Clear to auscultation bilaterally. No wheezes, rales, or rhonchi. No distress.  Gastrointestinal:  +BS, soft, non-tender and non-distended. No HSM noted. No guarding or rebound. No masses appreciated.  Rectal:  Deferred  Musculoskalatal:  Symmetrical without gross deformities. Neurologic:  Alert and oriented x4;  grossly normal neurologically. Psych:  Alert and cooperative. Normal mood and affect. Heme/Lymph/Immune: No excessive bruising noted.    11/21/2018 8:23 AM   Disclaimer: This note was dictated with voice recognition software. Similar sounding words can inadvertently be transcribed and may not be corrected upon review.

## 2018-11-21 NOTE — Patient Instructions (Signed)
Your health issues we discussed today were:   GERD (heartburn): 1. Continue taking your current medications.  Specifically take Protonix twice a day 2. Discussed a bone density scan with your primary care doctor. 3. You can consider taking calcium supplement as well 4. Call us for any severe or worsening symptoms.  Abdominal pain: 1. I am glad your abdominal pain is better 2. Keep an eye out for returning symptoms  Overall I recommend:  1. Follow-up in 6 months 2. Call us if you have any questions or concerns  At [facility] we value your feedback. You may receive a survey about your visit today. Please share your experience as we strive to create trusting relationships with our patients to provide genuine, compassionate, quality care.  We appreciate your understanding and patience as we review any laboratory studies, imaging, and other diagnostic tests that are ordered as we care for you. Our office policy is 5 business days for review of these results, and any emergent or urgent results are addressed in a timely manner for your best interest. If you do not hear from our office in 1 week, please contact us.   We also encourage the use of MyChart, which contains your medical information for your review as well. If you are not enrolled in this feature, an access code is on this after visit summary for your convenience. Thank you for allowing [place] to be involved in your care.  It was great to see you today!  I hope you have a great day!!

## 2018-11-21 NOTE — Assessment & Plan Note (Signed)
The right sided side pain/abdominal pain that she has been having has resolved.  Her primary care theorized that she had a cracked rib.  Continue to monitor, follow-up as needed.

## 2018-11-21 NOTE — Progress Notes (Signed)
cc'ed to pcp °

## 2018-11-21 NOTE — Assessment & Plan Note (Signed)
GERD symptoms are significantly improved on PPI twice a day.  Recommend she continue this for the time being.  She expresses no interest in surgery (Nissen fundoplication) at this time.  Recommend she have a bone density scan regularly.  Can consider starting calcium supplementation as well.

## 2019-02-10 ENCOUNTER — Other Ambulatory Visit: Payer: Self-pay

## 2019-02-10 ENCOUNTER — Ambulatory Visit (INDEPENDENT_AMBULATORY_CARE_PROVIDER_SITE_OTHER): Payer: Medicaid Other | Admitting: Nurse Practitioner

## 2019-02-10 ENCOUNTER — Encounter: Payer: Self-pay | Admitting: Nurse Practitioner

## 2019-02-10 DIAGNOSIS — R1310 Dysphagia, unspecified: Secondary | ICD-10-CM

## 2019-02-10 DIAGNOSIS — K219 Gastro-esophageal reflux disease without esophagitis: Secondary | ICD-10-CM

## 2019-02-10 NOTE — Patient Instructions (Signed)
Your health issues we discussed today were:   GERD (reflux/heartburn): 1. I am glad you are still doing well 2. Continue taking your medications 3. Have a bone density scan based on the recommendations of your primary care and rheumatologist 4. Call if you have any worsening or severe symptoms  Overall I recommend:  1. Continue your other medications 2. Return for follow-up in 1 year 3. Call us if you have any questions or concerns   Because of recent events of COVID-19 ("Coronavirus"), follow CDC recommendations:  1. Wash your hand frequently 2. Avoid touching your face 3. Stay away from people who are sick 4. If you have symptoms such as fever, cough, shortness of breath then call your healthcare provider for further guidance 5. If you are sick, STAY AT HOME unless otherwise directed by your healthcare provider. 6. Follow directions from state and national officials regarding staying safe   At [facility] we value your feedback. You may receive a survey about your visit today. Please share your experience as we strive to create trusting relationships with our patients to provide genuine, compassionate, quality care.  We appreciate your understanding and patience as we review any laboratory studies, imaging, and other diagnostic tests that are ordered as we care for you. Our office policy is 5 business days for review of these results, and any emergent or urgent results are addressed in a timely manner for your best interest. If you do not hear from our office in 1 week, please contact us.   We also encourage the use of MyChart, which contains your medical information for your review as well. If you are not enrolled in this feature, an access code is on this after visit summary for your convenience. Thank you for allowing [place] to be involved in your care.  It was great to see you today!  I hope you have a great day!!

## 2019-02-10 NOTE — Assessment & Plan Note (Signed)
GERD symptoms doing well.  She typically gets quite symptomatic when her PPI is reduced to once a day.  Recommend she continue twice a day dosing.  Based on my previous recommendation she discussed bone density scan with rheumatologist who recommended she continue vitamin D3, calcium, hold off until little bit later.  Recommend she follow-up with rheumatology and primary care based on her recommendations.  Return for follow-up in 1 year here.  Call if any worsening.

## 2019-02-10 NOTE — Assessment & Plan Note (Signed)
No further dysphasia.  Continue to monitor and follow-up as needed.

## 2019-02-10 NOTE — Progress Notes (Signed)
Referring Provider: Selinda FlavinHoward, Kevin, MD Primary Care Physician:  Selinda FlavinHoward, Kevin, MD Primary GI:  Dr. Darrick PennaFields  NOTE: Service was provided via telemedicine and was requested by the patient due to COVID-19 pandemic.  Method of visit: [email or web address]  [facility]  Provider Location: Office  Reason for Phone Visit: Follow-up  The patient was consented to phone follow-up via telephone encounter including billing of the encounter (yes/no): Yes  Persons present on the phone encounter, with roles: None  Total time (minutes) spent on medical discussion: 15 minutes  Chief Complaint  Patient presents with  . Gastroesophageal Reflux    f/u, doing ok    HPI:   Ruth Shah is a 43 y.o. female who presents for virtual visit regarding: Follow-up on GERD and abdominal pain.  The patient was last seen in our office 11/21/2018 for abdominal pain and GERD.  Noted history of GERD, dysphagia, abdominal pain, diarrhea.  EGD on file for benign-appearing intrinsic stenosis that was widely patent and traversed.  Normal HIDA scan on file.  She previously had worsening symptoms when Protonix was changed to once daily by her PCP.  History of significant flank pain associated with diarrhea with evaluation by multiple providers and 2 CT scans without obvious etiology.  CBC, CMP end of 2019 normal.  Right upper quadrant ultrasound normal other than hepatic steatosis.  At her last visit she was doing better on twice daily PPI, no breakthrough symptoms.  Side pain is resolved.  Some nausea depending on diet.  No other GI complaints.  Recommend she continue her current medications, discussed bone density screening recommendations to bring back to her primary care, can consider calcium supplementation, follow-up in 6 months.  Today she states she's doing well overall. GERD better managed on twice daily Protonix. Denies breakthrough GERD symptoms. Denies abdominal pain, N/V, fever, chills, unintentional  weight loss. States every now and then (rarely) has a brief "IBS moment" that is brief and not particularly bothersome. Denies URI and flu-like symptoms. Denies loss of taste/smell. Denies chest pain, dyspnea, dizziness, lightheadedness, syncope, near syncope. Denies any other upper or lower GI symptoms.   Past Medical History:  Diagnosis Date  . ADD (attention deficit disorder)   . Arthritis   . Asthma    mild seasonal reactions to mold and mildew  . Back pain   . Depression   . Fibromyalgia   . GERD (gastroesophageal reflux disease)   . Herpes simplex disease   . History of kidney stones   . Migraines   . Paresthesia of both hands   . PONV (postoperative nausea and vomiting)   . Urticaria     Past Surgical History:  Procedure Laterality Date  . ENDOMETRIAL ABLATION    . ESOPHAGEAL MANOMETRY N/A 10/15/2017   Procedure: ESOPHAGEAL MANOMETRY (EM);  Surgeon: Napoleon FormNandigam, Kavitha V, MD;  Location: [facility];  Service: Endoscopy;  Laterality: N/A;  . ESOPHAGOGASTRODUODENOSCOPY (EGD) WITH PROPOFOL N/A 12/26/2016   Dr. Darrick PennaFields: one benign-appearing, intrinsic stenosis that was widely patent and traversed. Small hiatal hernia, few small sessile polyps. Chronic gastritis. Normal duodenum, negative celiac sprue.   Marland Kitchen. HERNIA REPAIR     inguinal as a baby   . left ovarian removal due to cyst    . PILONIDAL CYST / SINUS EXCISION  2009  . RECTOCELE REPAIR N/A 10/02/2017   Procedure: POSTERIOR REPAIR (RECTOCELE);  Surgeon: Tilda BurrowFerguson, John V, MD;  Location: [facility];  Service: Gynecology;  Laterality: N/A;  . SINOSCOPY    .  TUBAL LIGATION    . VAGINAL HYSTERECTOMY N/A 10/02/2017   Procedure: HYSTERECTOMY VAGINAL;  Surgeon: Tilda Burrow, MD;  Location: [facility];  Service: Gynecology;  Laterality: N/A;    Current Outpatient Medications  Medication Sig Dispense Refill  . ADDERALL XR 15 MG 24 hr capsule Take 20 mg by mouth every morning.   0  . FLUoxetine (PROZAC) 20 MG capsule Take 20 mg by mouth  daily.    . pantoprazole (PROTONIX) 40 MG tablet Take 1 tablet (40 mg total) by mouth 2 (two) times daily before a meal. 180 tablet 3  . propranolol ER (INDERAL LA) 60 MG 24 hr capsule Take 60 mg by mouth at bedtime.    . SUMAtriptan (IMITREX) 50 MG tablet Take by mouth as needed.      No current facility-administered medications for this visit.     Allergies as of 02/10/2019 - Review Complete 02/10/2019  Allergen Reaction Noted  . Beef-derived products  03/24/2018  . Dilaudid [hydromorphone hcl] Nausea And Vomiting 10/10/2017  . Duloxetine Other (See Comments) 06/13/2018  . Other Hives 05/16/2018  . Sulfa antibiotics Nausea Only 07/20/2016  . Toradol [ketorolac tromethamine] Nausea Only 07/20/2016  . Vicodin [hydrocodone-acetaminophen] Itching 08/12/2015    Family History  Problem Relation Age of Onset  . Cancer Mother        skin  . Depression Mother   . Hypertension Mother   . Hyperlipidemia Mother   . Diabetes Mother   . Cancer Father        skin  . Arthritis Father   . Hypertension Father   . Hyperlipidemia Father   . Asthma Brother   . Depression Maternal Grandmother   . Hypertension Maternal Grandmother   . Mental illness Maternal Grandmother   . Cancer Maternal Grandfather        kidney  . Hypertension Maternal Grandfather   . Kidney disease Maternal Grandfather   . Emphysema Paternal Grandmother   . Alzheimer's disease Paternal Grandmother   . Parkinson's disease Paternal Grandfather   . Colon cancer Neg Hx   . Colon polyps Neg Hx   . Allergic rhinitis Neg Hx   . Eczema Neg Hx   . Urticaria Neg Hx     Social History   Socioeconomic History  . Marital status: Married    Spouse name: Not on file  . Number of children: 4  . Years of education: Some College  . Highest education level: Not on file  Occupational History  . Not on file  Social Needs  . Financial resource strain: Not on file  . Food insecurity:    Worry: Not on file    Inability: Not  on file  . Transportation needs:    Medical: Not on file    Non-medical: Not on file  Tobacco Use  . Smoking status: Never Smoker  . Smokeless tobacco: Never Used  Substance and Sexual Activity  . Alcohol use: No    Alcohol/week: 0.0 standard drinks  . Drug use: No  . Sexual activity: Yes    Birth control/protection: Surgical    Comment: hyst  Lifestyle  . Physical activity:    Days per week: Not on file    Minutes per session: Not on file  . Stress: Not on file  Relationships  . Social connections:    Talks on phone: Not on file    Gets together: Not on file    Attends religious service: Not on file  Active member of club or organization: Not on file    Attends meetings of clubs or organizations: Not on file    Relationship status: Not on file  Other Topics Concern  . Not on file  Social History Narrative   Lives at home with husband and children.   Right-handed.   4 cups caffeine per week.    Review of Systems: Complete ROS negative except as per HPI.  Physical Exam: Note: limited exam due to virtual visit General:   Alert and oriented. Pleasant and cooperative. Well-nourished and well-developed.  Head:  Normocephalic and atraumatic. Eyes:  Without icterus, sclera clear and conjunctiva pink.  Ears:  Normal auditory acuity. Skin:  Intact without facial significant lesions or rashes. Neurologic:  Alert and oriented x4;  grossly normal neurologically. Psych:  Alert and cooperative. Normal mood and affect. Heme/Lymph/Immune: No excessive bruising noted.

## 2019-02-11 NOTE — Progress Notes (Signed)
CC'ED TO PCP 

## 2019-05-22 ENCOUNTER — Ambulatory Visit: Payer: Medicaid Other | Admitting: Nurse Practitioner

## 2019-06-17 ENCOUNTER — Encounter: Payer: Self-pay | Admitting: Nurse Practitioner

## 2019-06-17 ENCOUNTER — Ambulatory Visit (INDEPENDENT_AMBULATORY_CARE_PROVIDER_SITE_OTHER): Payer: Medicaid Other | Admitting: Nurse Practitioner

## 2019-06-17 ENCOUNTER — Encounter: Payer: Self-pay | Admitting: Gastroenterology

## 2019-06-17 DIAGNOSIS — R109 Unspecified abdominal pain: Secondary | ICD-10-CM | POA: Diagnosis not present

## 2019-06-17 DIAGNOSIS — R159 Full incontinence of feces: Secondary | ICD-10-CM

## 2019-06-17 MED ORDER — DICYCLOMINE HCL 10 MG PO CAPS: 10.0000 mg | ORAL_CAPSULE | Freq: Four times a day (QID) | ORAL | 2 refills | Status: DC | PRN

## 2019-06-17 NOTE — Progress Notes (Signed)
Referring Provider: Selinda Flavin, MD Primary Care Physician:  Selinda Flavin, MD Primary GI:  Dr. Darrick Penna  NOTE: Service was provided via telemedicine and was requested by the patient due to COVID-19 pandemic.  Method of visit: Telephone (failed Doxy.Me)  Patient Location: Home  Provider Location: Office  Reason for Phone Visit: Follow-up  The patient was consented to phone follow-up via telephone encounter including billing of the encounter (yes/no): Yes  Persons present on the phone encounter, with roles: N/A  Total time (minutes) spent on medical discussion: 22 minutes  Chief Complaint  Patient presents with  . Abdominal Pain    right side pain after eating, under rib area  . bowel leakage    HPI:   Ruth Shah is a 43 y.o. female who presents for virtual visit regarding: follow-up.  Patient was last seen in our office 02/10/2019 for GERD and dysphagia.  The patient's last office visit was a virtual visit due to coronavirus/COVID-19 pandemic.  Noted history of GERD, dysphagia, abdominal pain, diarrhea.  EGD on file for benign appearing intrinsic stenosis that was widely patent and transversed.  Normal HIDA scan on file.  History of significant flank pain associated with diarrhea that was evaluated by multiple providers 2 CT scans without obvious etiology.  Right upper quadrant ultrasound normal other than hepatic steatosis.  Her last visit her GERD was better managed on twice daily Protonix and denies breakthrough symptoms.  Denies abdominal pain, nausea or vomiting.  Intermittent (rare) brief "IBS moment" but is not particularly bothersome.  No other GI complaints.  Recommended continue current medications, plan for bone density scan based on PCP recommendations and rheumatology recommendations, follow-up in 1 year.  Today she states she's doing ok overall. Having right side pain, under her ribs. Pain is described as an aching feeling, intermittent, post-prandial, typically  lasts up to an hour but self-resolves. Feels like "a stitch in your side." HIDA completed 2018 which had GB EF 40% (normal is 33%). When this happens, if she has a bowel movement it will be a yellowish/pale color. Is also having anal leakage. Her regular bowel movements are like soft-serve iced cream. Has had nausea a couple times, but not bad or often. Denies vomiting. Denies hematochezia, melena, fever, chills, unintentional weight loss. Denies URI or flu-like symptoms. Denies loss of sense of taste or smell. Denies chest pain, dyspnea, dizziness, lightheadedness, syncope, near syncope. Denies any other upper or lower GI symptoms.  Past Medical History:  Diagnosis Date  . ADD (attention deficit disorder)   . Arthritis   . Asthma    mild seasonal reactions to mold and mildew  . Back pain   . Depression   . Fibromyalgia   . GERD (gastroesophageal reflux disease)   . Herpes simplex disease   . History of kidney stones   . Migraines   . Paresthesia of both hands   . PONV (postoperative nausea and vomiting)   . Urticaria     Past Surgical History:  Procedure Laterality Date  . ENDOMETRIAL ABLATION    . ESOPHAGEAL MANOMETRY N/A 10/15/2017   Procedure: ESOPHAGEAL MANOMETRY (EM);  Surgeon: Napoleon Form, MD;  Location: [facility];  Service: Endoscopy;  Laterality: N/A;  . ESOPHAGOGASTRODUODENOSCOPY (EGD) WITH PROPOFOL N/A 12/26/2016   Dr. Darrick Penna: one benign-appearing, intrinsic stenosis that was widely patent and traversed. Small hiatal hernia, few small sessile polyps. Chronic gastritis. Normal duodenum, negative celiac sprue.   Marland Kitchen HERNIA REPAIR     inguinal as  a baby   . left ovarian removal due to cyst    . PILONIDAL CYST / SINUS EXCISION  2009  . RECTOCELE REPAIR N/A 10/02/2017   Procedure: POSTERIOR REPAIR (RECTOCELE);  Surgeon: Jonnie Kind, MD;  Location: [facility];  Service: Gynecology;  Laterality: N/A;  . SINOSCOPY    . TUBAL LIGATION    . VAGINAL HYSTERECTOMY N/A 10/02/2017    Procedure: HYSTERECTOMY VAGINAL;  Surgeon: Jonnie Kind, MD;  Location: [facility];  Service: Gynecology;  Laterality: N/A;    Current Outpatient Medications  Medication Sig Dispense Refill  . ADDERALL XR 15 MG 24 hr capsule Take 20 mg by mouth every morning.   0  . FLUoxetine (PROZAC) 20 MG capsule Take 20 mg by mouth daily.    . pantoprazole (PROTONIX) 40 MG tablet Take 1 tablet (40 mg total) by mouth 2 (two) times daily before a meal. (Patient taking differently: Take 40 mg by mouth daily. ) 180 tablet 3  . propranolol ER (INDERAL LA) 60 MG 24 hr capsule Take 80 mg by mouth at bedtime.     . SUMAtriptan (IMITREX) 50 MG tablet Take by mouth as needed.      No current facility-administered medications for this visit.     Allergies as of 06/17/2019 - Review Complete 06/17/2019  Allergen Reaction Noted  . Beef-derived products  03/24/2018  . Dilaudid [hydromorphone hcl] Nausea And Vomiting 10/10/2017  . Duloxetine Other (See Comments) 06/13/2018  . Other Hives 05/16/2018  . Sulfa antibiotics Nausea Only 07/20/2016  . Toradol [ketorolac tromethamine] Nausea Only 07/20/2016  . Vicodin [hydrocodone-acetaminophen] Itching 08/12/2015    Family History  Problem Relation Age of Onset  . Cancer Mother        skin  . Depression Mother   . Hypertension Mother   . Hyperlipidemia Mother   . Diabetes Mother   . Cancer Father        skin  . Arthritis Father   . Hypertension Father   . Hyperlipidemia Father   . Asthma Brother   . Depression Maternal Grandmother   . Hypertension Maternal Grandmother   . Mental illness Maternal Grandmother   . Cancer Maternal Grandfather        kidney  . Hypertension Maternal Grandfather   . Kidney disease Maternal Grandfather   . Emphysema Paternal Grandmother   . Alzheimer's disease Paternal Grandmother   . Parkinson's disease Paternal Grandfather   . Colon cancer Neg Hx   . Colon polyps Neg Hx   . Allergic rhinitis Neg Hx   . Eczema Neg Hx   .  Urticaria Neg Hx     Social History   Socioeconomic History  . Marital status: Married    Spouse name: Not on file  . Number of children: 4  . Years of education: Some College  . Highest education level: Not on file  Occupational History  . Not on file  Social Needs  . Financial resource strain: Not on file  . Food insecurity    Worry: Not on file    Inability: Not on file  . Transportation needs    Medical: Not on file    Non-medical: Not on file  Tobacco Use  . Smoking status: Never Smoker  . Smokeless tobacco: Never Used  Substance and Sexual Activity  . Alcohol use: No    Alcohol/week: 0.0 standard drinks  . Drug use: No  . Sexual activity: Yes    Birth control/protection: Surgical  Comment: hyst  Lifestyle  . Physical activity    Days per week: Not on file    Minutes per session: Not on file  . Stress: Not on file  Relationships  . Social Musician on phone: Not on file    Gets together: Not on file    Attends religious service: Not on file    Active member of club or organization: Not on file    Attends meetings of clubs or organizations: Not on file    Relationship status: Not on file  Other Topics Concern  . Not on file  Social History Narrative   Lives at home with husband and children.   Right-handed.   4 cups caffeine per week.    Review of Systems: Complete ROS negative except as per HPI.  Physical Exam: Note: limited exam due to virtual visit General:   Alert and oriented. Pleasant and cooperative. Ears:  Normal auditory acuity. Neurologic:  Alert and oriented x4 Psych:  Alert and cooperative. Normal mood and affect.

## 2019-06-17 NOTE — Assessment & Plan Note (Signed)
The patient has noted a couple episodes of rectal leakage.  Her stools are normally consistent with [place] 5-6.  Query for sphincter muscle tone or dyssynergia.  Will be see her back in 4 to 6 weeks if she is still having symptoms we can consider referral to [facility] for rectal manometry.  We are going to try to hold her on Bentyl for her abdominal pain and this could help solidify her stools a bit and a lot better control.  Further recommendations to follow her clinical response.  Follow-up in 4 to 6 weeks.

## 2019-06-17 NOTE — Assessment & Plan Note (Signed)
Abdominal pain described as more on her upper right side, less abdominal more side pain "like if you put your hands on your hips but moves them up higher."  Pain is described as "a stitch" that is dull and crampy.  It occurs postprandially and typically self resolves after about an hour.  Her stools changed colors when she is having symptoms to be yellowish or pale.  Her stools are normally [place] 5 to [place] 6.  I will trial her on Bentyl 10 mg 4 times a day as needed to see if this helps as a component of IBS type symptoms.  I have asked for a progress report in 1 to 2 weeks.  If she is having persistent symptoms we can consider a HIDA scan (last HIDA scan in 2017 or 2018).  Follow-up in 4 to 6 weeks.

## 2019-06-17 NOTE — Patient Instructions (Signed)
Your health issues we discussed today were:   Right side pain: 1. Because you are having "softer than normal" stools and right side pain, I will trial you on Bentyl 10 mg. 2. You can take this up to 4 times a day as needed for side pain or loose/soft stools 3. Call us in 1 to 2 weeks and let us know if it is helping your symptoms 4. If you become constipated stop taking the medication and call to let us know 5. Further recommendations will follow based on how you respond to the medication  Rectal leakage: 1. Bentyl may help solidify your stools more and subsequently help with the rectal leakage 2. When you follow-up we can discuss further and if you are still having symptoms we can refer you for specialized testing 3. Let us know if you have any worsening or severe symptoms  Overall I recommend:  1. Continue your other current medications 2. Return for follow-up in 4 to 6 weeks 3. Call us if you have any questions or concerns   Because of recent events of COVID-19 ("Coronavirus"), follow CDC recommendations:  1. Wash your hand frequently 2. Avoid touching your face 3. Stay away from people who are sick 4. If you have symptoms such as fever, cough, shortness of breath then call your healthcare provider for further guidance 5. If you are sick, STAY AT HOME unless otherwise directed by your healthcare provider. 6. Follow directions from state and national officials regarding staying safe   At [facility] we value your feedback. You may receive a survey about your visit today. Please share your experience as we strive to create trusting relationships with our patients to provide genuine, compassionate, quality care.  We appreciate your understanding and patience as we review any laboratory studies, imaging, and other diagnostic tests that are ordered as we care for you. Our office policy is 5 business days for review of these results, and any emergent or urgent results are  addressed in a timely manner for your best interest. If you do not hear from our office in 1 week, please contact us.   We also encourage the use of MyChart, which contains your medical information for your review as well. If you are not enrolled in this feature, an access code is on this after visit summary for your convenience. Thank you for allowing [place] to be involved in your care.  It was great to see you today!  I hope you have a great Fall!!

## 2019-07-17 ENCOUNTER — Telehealth: Payer: Self-pay | Admitting: *Deleted

## 2019-07-17 NOTE — Telephone Encounter (Signed)
If no trauma or focal new weakness then can OV and repeat last injection if insurance ok, last seen by Dr. Lorin Mercy in October.

## 2019-07-23 NOTE — Telephone Encounter (Signed)
Pt is scheduled 08/12/2019 with driver and no BTs.

## 2019-08-05 ENCOUNTER — Encounter: Payer: Self-pay | Admitting: Nurse Practitioner

## 2019-08-05 ENCOUNTER — Ambulatory Visit: Payer: Medicaid Other | Admitting: Nurse Practitioner

## 2019-08-05 ENCOUNTER — Other Ambulatory Visit: Payer: Self-pay

## 2019-08-05 VITALS — BP 133/94 | HR 70 | Temp 97.0°F | Ht 63.0 in | Wt 183.6 lb

## 2019-08-05 DIAGNOSIS — K6289 Other specified diseases of anus and rectum: Secondary | ICD-10-CM | POA: Diagnosis not present

## 2019-08-05 DIAGNOSIS — R109 Unspecified abdominal pain: Secondary | ICD-10-CM

## 2019-08-05 DIAGNOSIS — R197 Diarrhea, unspecified: Secondary | ICD-10-CM

## 2019-08-05 DIAGNOSIS — R159 Full incontinence of feces: Secondary | ICD-10-CM

## 2019-08-05 MED ORDER — DILTIAZEM GEL 2 %: 1.0000 "application " | Freq: Two times a day (BID) | CUTANEOUS | 1 refills | Status: DC | PRN

## 2019-08-05 NOTE — Assessment & Plan Note (Signed)
The patient describes persistent soft stools consistent with Bristol 5-6.  Bentyl caused constipation.  Imodium has not worked.  At this point I will have her try probiotics to see if this makes a difference.  Otherwise we can try other antidiarrheal medications.  Follow-up in 2 months.

## 2019-08-05 NOTE — Assessment & Plan Note (Signed)
Persistent rectal leakage.  Query sphincter weakness/dysfunction.  At this point she is agreeable to go to [facility] for anorectal manometry.  We will refer her.  Follow-up in 2 months.

## 2019-08-05 NOTE — Progress Notes (Signed)
Referring Provider: Selinda FlavinHoward, Kevin, MD Primary Care Physician:  Selinda FlavinHoward, Kevin, MD Primary GI:  Dr. Darrick PennaFields  Chief Complaint  Patient presents with  . Abdominal Pain    right side comes/goes  . change in bowels    stools are very mooshy. c/o rectal leakage, rectal spams that comes/goes that makes her feel very uncomfortable    HPI:   Ruth Shah is a 43 y.o. female who presents for follow-up on abdominal pain.  Patient was last seen in our office 06/17/2019 for abdominal pain and fecal incontinence.  The patient's most recent visit was a virtual office visit due to COVID-19/coronavirus pandemic.  Noted history of GERD, dysphagia, abdominal pain, diarrhea.  EGD on file for benign appearing intrinsic stenosis that was widely patent and transversed.  Normal HIDA scan on file.  History of significant flank pain associated with diarrhea that was evaluated by multiple providers and 2 CT scans without obvious etiology.  Right upper quadrant ultrasound on file that was unremarkable other than hepatic steatosis.  At her last office visit she noted right-sided pain under her ribs described as an aching and is postprandial like "a stitch in your side".  HIDA in 2018 which had a gallbladder EF of 40% (normal is 33% or greater).  When she has her pain if she has a bowel movement her stool be yellowish/pale color.  Also noted anal leakage.  Her bowel movements consistent with [place] 5.  Rare nausea.  No other overt GI symptoms.  Recommended trial of Bentyl 10 mg up to 4 times a day as needed, follow-up in 4 to 6 weeks.  Today she states she's doing ok overall. She's going back to school in the Spring at [employer] for [employer] Administration. Right sided discomfort not as bad, intermittent. Notices more if she's exerting herself. Still with soft stool consistent with [place] 5-6. Denies hematochezia. Pain typically doesn't get better with bowel movement. Bentyl caused her to be constipated. Has tried Imodium which  didn't help. Still with anal leakage. Is thinking about probiotics. Leakage happens about 3-4 times a week. Also having intermittent rectal spasms which are very painful, pressure pain, lasts about 2-3 minutes or as long as 5-10 minutes but then self resolved. Can happen as often as 2-3 times a day or as little as every 2 weeks. Denies N/V, melena, fever, chills, unintentional weight loss. Denies URI or flu-like symptoms. Denies loss of sense of taste or smell. Denies chest pain, dyspnea, dizziness, lightheadedness, syncope, near syncope. Denies any other upper or lower GI symptoms.  GERD doing well on protonix, can tell if she misses a dose.  Past Medical History:  Diagnosis Date  . ADD (attention deficit disorder)   . Arthritis   . Asthma    mild seasonal reactions to mold and mildew  . Back pain   . Depression   . Fibromyalgia   . GERD (gastroesophageal reflux disease)   . Herpes simplex disease   . History of kidney stones   . Migraines   . Paresthesia of both hands   . PONV (postoperative nausea and vomiting)   . Urticaria     Past Surgical History:  Procedure Laterality Date  . ENDOMETRIAL ABLATION    . ESOPHAGEAL MANOMETRY N/A 10/15/2017   Procedure: ESOPHAGEAL MANOMETRY (EM);  Surgeon: Napoleon FormNandigam, Kavitha V, MD;  Location: [facility];  Service: Endoscopy;  Laterality: N/A;  . ESOPHAGOGASTRODUODENOSCOPY (EGD) WITH PROPOFOL N/A 12/26/2016   Dr. Darrick PennaFields: one benign-appearing, intrinsic stenosis that  was widely patent and traversed. Small hiatal hernia, few small sessile polyps. Chronic gastritis. Normal duodenum, negative celiac sprue.   Marland Kitchen HERNIA REPAIR     inguinal as a baby   . left ovarian removal due to cyst    . PILONIDAL CYST / SINUS EXCISION  2009  . RECTOCELE REPAIR N/A 10/02/2017   Procedure: POSTERIOR REPAIR (RECTOCELE);  Surgeon: Tilda Burrow, MD;  Location: [facility];  Service: Gynecology;  Laterality: N/A;  . SINOSCOPY    . TUBAL LIGATION    . VAGINAL HYSTERECTOMY  N/A 10/02/2017   Procedure: HYSTERECTOMY VAGINAL;  Surgeon: Tilda Burrow, MD;  Location: [facility];  Service: Gynecology;  Laterality: N/A;    Current Outpatient Medications  Medication Sig Dispense Refill  . ADDERALL XR 15 MG 24 hr capsule Take 20 mg by mouth every morning.   0  . dicyclomine (BENTYL) 10 MG capsule Take 1 capsule (10 mg total) by mouth 4 (four) times daily as needed for spasms (abdominal pain or soft/loose stools). 90 capsule 2  . FLUoxetine (PROZAC) 20 MG capsule Take 20 mg by mouth daily.    . pantoprazole (PROTONIX) 40 MG tablet Take 1 tablet (40 mg total) by mouth 2 (two) times daily before a meal. (Patient taking differently: Take 40 mg by mouth daily. ) 180 tablet 3  . propranolol ER (INDERAL LA) 60 MG 24 hr capsule Take 80 mg by mouth at bedtime.     . SUMAtriptan (IMITREX) 50 MG tablet Take by mouth as needed.      No current facility-administered medications for this visit.     Allergies as of 08/05/2019 - Review Complete 08/05/2019  Allergen Reaction Noted  . Beef-derived products  03/24/2018  . Dilaudid [hydromorphone hcl] Nausea And Vomiting 10/10/2017  . Duloxetine Other (See Comments) 06/13/2018  . Other Hives 05/16/2018  . Sulfa antibiotics Nausea Only 07/20/2016  . Toradol [ketorolac tromethamine] Nausea Only 07/20/2016  . Vicodin [hydrocodone-acetaminophen] Itching 08/12/2015    Family History  Problem Relation Age of Onset  . Cancer Mother        skin  . Depression Mother   . Hypertension Mother   . Hyperlipidemia Mother   . Diabetes Mother   . Cancer Father        skin  . Arthritis Father   . Hypertension Father   . Hyperlipidemia Father   . Asthma Brother   . Depression Maternal Grandmother   . Hypertension Maternal Grandmother   . Mental illness Maternal Grandmother   . Cancer Maternal Grandfather        kidney  . Hypertension Maternal Grandfather   . Kidney disease Maternal Grandfather   . Emphysema Paternal Grandmother   .  Alzheimer's disease Paternal Grandmother   . Parkinson's disease Paternal Grandfather   . Colon cancer Neg Hx   . Colon polyps Neg Hx   . Allergic rhinitis Neg Hx   . Eczema Neg Hx   . Urticaria Neg Hx     Social History   Socioeconomic History  . Marital status: Married    Spouse name: Not on file  . Number of children: 4  . Years of education: Some College  . Highest education level: Not on file  Occupational History  . Not on file  Social Needs  . Financial resource strain: Not on file  . Food insecurity    Worry: Not on file    Inability: Not on file  . Transportation needs  Medical: Not on file    Non-medical: Not on file  Tobacco Use  . Smoking status: Never Smoker  . Smokeless tobacco: Never Used  Substance and Sexual Activity  . Alcohol use: No    Alcohol/week: 0.0 standard drinks  . Drug use: No  . Sexual activity: Yes    Birth control/protection: Surgical    Comment: hyst  Lifestyle  . Physical activity    Days per week: Not on file    Minutes per session: Not on file  . Stress: Not on file  Relationships  . Social Herbalist on phone: Not on file    Gets together: Not on file    Attends religious service: Not on file    Active member of club or organization: Not on file    Attends meetings of clubs or organizations: Not on file    Relationship status: Not on file  Other Topics Concern  . Not on file  Social History Narrative   Lives at home with husband and children.   Right-handed.   4 cups caffeine per week.    Review of Systems: General: Negative for anorexia, weight loss, fever, chills, fatigue, weakness. ENT: Negative for hoarseness, difficulty swallowing. CV: Negative for chest pain, angina, palpitations, peripheral edema.  Respiratory: Negative for dyspnea at rest, cough, sputum, wheezing.  GI: See history of present illness. Endo: Negative for unusual weight change.  Heme: Negative for bruising or bleeding. Allergy:  Negative for rash or hives.   Physical Exam: BP (!) 133/94   Pulse 70   Temp (!) 97 F (36.1 C) (Oral)   Ht 5\' 3"  (1.6 m)   Wt 183 lb 9.6 oz (83.3 kg)   BMI 32.52 kg/m  General:   Alert and oriented. Pleasant and cooperative. Well-nourished and well-developed.  Eyes:  Without icterus, sclera clear and conjunctiva pink.  Ears:  Normal auditory acuity. Cardiovascular:  S1, S2 present without murmurs appreciated. Extremities without clubbing or edema. Respiratory:  Clear to auscultation bilaterally. No wheezes, rales, or rhonchi. No distress.  Gastrointestinal:  +BS, soft, non-tender and non-distended. No HSM noted. No guarding or rebound. No masses appreciated.  Rectal:  Deferred  Musculoskalatal:  Symmetrical without gross deformities. Neurologic:  Alert and oriented x4;  grossly normal neurologically. Psych:  Alert and cooperative. Normal mood and affect. Heme/Lymph/Immune: No excessive bruising noted.    08/05/2019 9:04 AM   Disclaimer: This note was dictated with voice recognition software. Similar sounding words can inadvertently be transcribed and may not be corrected upon review.

## 2019-08-05 NOTE — Patient Instructions (Signed)
Your health issues we discussed today were:   Abdominal discomfort: 1. Continue to monitor your symptoms 2. Let us know if you have any worsening or severe symptoms  Soft stools: 1. Start taking probiotics daily.  Her multiple options with varying doses.  Follow the directions on the container for the option of select 2. Call us in 1 month if no improvement in your soft stools 3. Call us for any worsening or severe symptoms  Anal leakage: 1. We will refer you to [facility] for rectal manometry to test the strength of your sphincter muscle and see if this is causing your leakage 2. Further recommendations to follow  Rectal spasms: 1. I have sent a prescription for Cardizem gel to your pharmacy 2. You can apply this at the onset of your symptoms as often as twice a day for up to 10 days at a time 3. Call us if you have any worsening or severe symptoms  Overall I recommend:  1. Given your symptoms, we may need to discuss colonoscopy at your next visit 2. Continue your other current medications 3. Call us if you have any questions or concerns 4. Follow-up in 2 months   Because of recent events of COVID-19 ("Coronavirus"), follow CDC recommendations:  1. Wash your hand frequently 2. Avoid touching your face 3. Stay away from people who are sick 4. If you have symptoms such as fever, cough, shortness of breath then call your healthcare provider for further guidance 5. If you are sick, STAY AT HOME unless otherwise directed by your healthcare provider. 6. Follow directions from state and national officials regarding staying safe  At [facility] we value your feedback. You may receive a survey about your visit today. Please share your experience as we strive to create trusting relationships with our patients to provide genuine, compassionate, quality care.  We appreciate your understanding and patience as we review any laboratory studies, imaging, and other diagnostic  tests that are ordered as we care for you. Our office policy is 5 business days for review of these results, and any emergent or urgent results are addressed in a timely manner for your best interest. If you do not hear from our office in 1 week, please contact us.   We also encourage the use of MyChart, which contains your medical information for your review as well. If you are not enrolled in this feature, an access code is on this after visit summary for your convenience. Thank you for allowing [place] to be involved in your care.  It was great to see you today!  I hope you have a Happy Thanksgiving!!

## 2019-08-05 NOTE — Assessment & Plan Note (Signed)
The patient describes rectal spasming that will sometimes occur twice a day but sometimes as infrequently as every 2 weeks.  Query proctalgia fugax.  I will trial her on nifedipine gel that she can apply at the start of her symptoms to see if this helps.  Follow-up in 2 months.  Previously declined colonoscopy.  We may need to pursue if she continues to have symptoms.

## 2019-08-05 NOTE — Assessment & Plan Note (Signed)
Abdominal pain/right flank pain is improved, but persistent.  HIDA scan 2018 essentially normal.  I do not feel an updated HIDA scan will offer much.  Query musculoskeletal in nature.  Follow-up in 2 months.  If she continues to have persistent symptoms we can consider an updated HIDA scan.  CT scans have been negative thus far.

## 2019-08-05 NOTE — Progress Notes (Signed)
cc'ed to pcp °

## 2019-08-06 ENCOUNTER — Ambulatory Visit: Payer: Medicaid Other | Admitting: Nurse Practitioner

## 2019-08-12 ENCOUNTER — Encounter: Payer: Medicaid Other | Admitting: Physical Medicine and Rehabilitation

## 2019-08-25 ENCOUNTER — Telehealth: Payer: Self-pay | Admitting: *Deleted

## 2019-08-25 NOTE — Telephone Encounter (Signed)
Pt said she was seen a few weeks ago. Pt says food is getting stuck in her esophagus and it is causing her to vomit.  [phone]

## 2019-08-27 ENCOUNTER — Other Ambulatory Visit: Payer: Self-pay | Admitting: Nurse Practitioner

## 2019-08-27 DIAGNOSIS — R1310 Dysphagia, unspecified: Secondary | ICD-10-CM

## 2019-08-27 NOTE — Telephone Encounter (Signed)
Pt called and we were talking, then she could not hear. Will await a call back.

## 2019-08-27 NOTE — Telephone Encounter (Signed)
I'd like to repeat a BPE (last done in 2018). Can we get her scheduled with an OV with any provider. She'll likely need an EGD but can determine based on BPE and symptoms.  If something gets stuck and will not go forward or backward, proceed to the ER.  Please change to a Dysphagia III diet (can mail to her home) in the meantime. I will put this in a separate note to be easer to print.

## 2019-08-27 NOTE — Telephone Encounter (Signed)
Called, mailbox full and cannot leave a message.

## 2019-08-27 NOTE — Telephone Encounter (Addendum)
I called pt and she said for the last couple of weeks she has problems with food going down. It feels like it gets stuck down near her sternum. Sometimes liquids help and sometimes it all comes back up. Said she has had episodes and had to pull the car over on the road to vomit. Randall Hiss, please advise!

## 2019-08-27 NOTE — Progress Notes (Signed)
Order for BPE

## 2019-08-27 NOTE — Patient Instructions (Signed)
Dysphagia Eating Plan, Bite Size Food °This diet plan is for people with moderate swallowing problems who have transitioned from pureed and minced foods. Bite size foods are soft and cut into small chunks so that they can be swallowed safely. On this eating plan, you may be instructed to drink liquids that are thickened. °Work with your health care provider and your diet and nutrition specialist (dietitian) to make sure that you are following the diet safely and getting all the nutrients you need. °What are tips for following this plan? °General guidelines for foods ° °· You may eat foods that are tender, soft, and moist. °· Always test food texture before taking a bite. Poke food with a fork or spoon to make sure it is tender. °· Food should be easy to cut and shew. Avoid large pieces of food that require a lot of chewing. °· Take small bites. Each bite should be smaller than your thumb nail (about 15mm by 15 mm). °· If you were on pureed and minced food diet plans, you may eat any of the foods included in those diets. °· Avoid foods that are very dry, hard, sticky, chewy, coarse, or crunchy. °· If instructed by your health care provider, thicken liquids. Follow your health care provider's instructions for what products to use, how to do this, and to what thickness. °? Your health care provider may recommend using a commercial thickener, rice cereal, or potato flakes. Ask your health care provider to recommend thickeners. °? Thickened liquids are usually a “pudding-like” consistency, or they may be as thick as honey or thick enough to eat with a spoon. °Cooking °· To moisten foods, you may add liquids while you are blending, mashing, or grinding your foods to the right consistency. These liquids include gravies, sauces, vegetable or fruit juice, milk, half and half, or water. °· Strain extra liquid from foods before eating. °· Reheat foods slowly to prevent a tough crust from forming. °· Prepare foods in  advance. °Meal planning °· Eat a variety of foods to get all the nutrients you need. °· Some foods may be tolerated better than others. Work with your dietitian to identify which foods are safest for you to eat. °· Follow your meal plan as told by your dietitian. °What foods are allowed? °Grains °Moist breads without nuts or seeds. Biscuits, muffins, pancakes, and waffles that are well-moistened with syrup, jelly, margarine, or butter. Cooked cereals. Moist bread stuffing. Moist rice. Well-moistened cold cereal with small chunks. Well-cooked pasta, noodles, rice, and bread dressing in small pieces and thick sauce. Soft dumplings or spaetzle in small pieces and butter or gravy. °Vegetables °Soft, well-cooked vegetables in small pieces. Soft-cooked, mashed potatoes. Thickened vegetable juice. °Fruits °Canned or cooked fruits that are soft or moist and do not have skin or seeds. Fresh, soft bananas. Thickened fruit juices. °Meat and other protein foods °Tender, moist meats or poultry in small pieces. Moist meatballs or meatloaf. Fish without bones. Eggs or egg substitutes in small pieces. Tofu. Tempeh and meat alternatives in small pieces. Well-cooked, tender beans, peas, baked beans, and other legumes. °Dairy °Thickened milk. Cream cheese. Yogurt. Cottage cheese. Sour cream. Small pieces of soft cheese. °Fats and oils °Butter. Oils. Margarine. Mayonnaise. Gravy. Spreads. °Sweets and desserts °Soft, smooth, moist desserts. Pudding. Custard. Moist cakes. Jam. Jelly. Honey. Preserves. Ask your health care provider whether you can have frozen desserts. °Seasoning and other foods °All seasonings and sweeteners. All sauces with small chunks. Prepared tuna, egg, or chicken   salad without raw fruits or vegetables. Moist casseroles with small, tender pieces of meat. Soups with tender meat. °What foods are not allowed? °Grains °Coarse or dry cereals. Dry breads. Toast. Crackers. Tough, crusty breads, such as French bread and  baguettes. Dry pancakes, waffles, and muffins. Sticky rice. Dry bread stuffing. Granola. Popcorn. Chips. °Vegetables °All raw vegetables. Cooked corn. Rubbery or stiff cooked vegetables. Stringy vegetables, such as celery. Tough, crisp fried potatoes. Potato skins. °Fruits °Hard, crunchy, stringy, high-pulp, and juicy raw fruits such as apples, pineapple, papaya, and watermelon. Small, round fruits, such as grapes. Dried fruit and fruit leather. °Meat and other protein foods °Large pieces of meat. Dry, tough meats, such as bacon, sausage, and hot dogs. Chicken, turkey, or fish with skin and bones. Crunchy peanut butter. Nuts. Seeds. Nut and seed butters. °Dairy °Yogurt with nuts, seeds, or large chunks. Large chunks of cheese. Frozen desserts and milk consistency not allowed by your dietitian. °Sweets and desserts °Dry cakes. Chewy or dry cookies. Any desserts with nuts, seeds, dry fruits, coconut, pineapple, or anything dry, sticky, or hard. Chewy caramel. Licorice. Taffy-type candies. Ask your health care provider whether you can have frozen desserts. °Seasoning and other foods °Soups with tough or large chunks of meats, poultry, or vegetables. Corn or clam chowder. Smoothies with large chunks of fruit. °Summary °· Bite size foods can be helpful for people with moderate swallowing problems. °· On the dysphagia eating plan, you may eat foods that are soft, moist, and cut into pieces smaller than 15mm by 15mm. °· You may be instructed to thicken liquids. Follow your health care provider's instructions about how to do this and to what consistency. °This information is not intended to replace advice given to you by your health care provider. Make sure you discuss any questions you have with your health care provider. °Document Released: 09/11/2005 Document Revised: 01/02/2019 Document Reviewed: 12/22/2016 °Elsevier Patient Education © 2020 [employer] Inc. ° °

## 2019-08-28 NOTE — Telephone Encounter (Signed)
Pt is scheduled OV for Tues 09/02/2019.  She is aware of the handout and will pick up when she comes in.

## 2019-09-01 NOTE — Progress Notes (Addendum)
REVIEWED-NO ADDITIONAL RECOMMENDATIONS.      Primary Care Physician:  Selinda Flavin, MD  Primary GI: Dr. Darrick Penna    Chief Complaint  Patient presents with  . Dysphagia    worse x past few week, mostly worse with solids and 1st thing in the AM    HPI:   Ruth Shah is a 43 y.o. female presenting today with a history of GERD, dysphagia, diarrhea, abdominal pain, with last EGD in April 2018.  BPE Nov 2018 with small partial Schatzki's ring without obstruction and an episode of dysmotility. Manometry in 2019 with normal relaxation of EG junction, no significant esophageal peristaltic abnormality.   Has had worsening dysphagia. Had to throw up 4 times yesterday on way to work. Trying to eat breakfast on way to work. Gets stuck and takes a drink then the drink just stays in esophagus. Over the past month has worsened. Protonix once daily.   Has rectal spasms at times. Has used diltiazem once. Chronic intermittent loose stool. No rectal bleeding. No prior colonoscopy. Diltiazem helped but sometimes does not have with her to use. Was prescribed last month. Stool is chronically soft, "self-serve".     Past Medical History:  Diagnosis Date  . ADD (attention deficit disorder)   . Arthritis   . Asthma    mild seasonal reactions to mold and mildew  . Back pain   . Depression   . Fibromyalgia   . GERD (gastroesophageal reflux disease)   . Herpes simplex disease   . History of kidney stones   . Migraines   . Paresthesia of both hands   . PONV (postoperative nausea and vomiting)   . Urticaria     Past Surgical History:  Procedure Laterality Date  . ENDOMETRIAL ABLATION    . ESOPHAGEAL MANOMETRY N/A 10/15/2017   Procedure: ESOPHAGEAL MANOMETRY (EM);  Surgeon: Napoleon Form, MD;  Location: [facility];  Service: Endoscopy;  Laterality: N/A;  . ESOPHAGOGASTRODUODENOSCOPY (EGD) WITH PROPOFOL N/A 12/26/2016   Dr. Darrick Penna: one benign-appearing, intrinsic stenosis that was widely  patent and traversed. Small hiatal hernia, few small sessile polyps. Chronic gastritis. Normal duodenum, negative celiac sprue.   Marland Kitchen HERNIA REPAIR     inguinal as a baby   . left ovarian removal due to cyst    . PILONIDAL CYST / SINUS EXCISION  2009  . RECTOCELE REPAIR N/A 10/02/2017   Procedure: POSTERIOR REPAIR (RECTOCELE);  Surgeon: Tilda Burrow, MD;  Location: [facility];  Service: Gynecology;  Laterality: N/A;  . SINOSCOPY    . TUBAL LIGATION    . VAGINAL HYSTERECTOMY N/A 10/02/2017   Procedure: HYSTERECTOMY VAGINAL;  Surgeon: Tilda Burrow, MD;  Location: [facility];  Service: Gynecology;  Laterality: N/A;    Current Outpatient Medications  Medication Sig Dispense Refill  . dicyclomine (BENTYL) 10 MG capsule Take 1 capsule (10 mg total) by mouth 4 (four) times daily as needed for spasms (abdominal pain or soft/loose stools). 90 capsule 2  . diltiazem 2 % GEL Apply 1 application topically 2 (two) times daily as needed (rectal spasm. Max twice daily for up to 10 days at a time). 30 g 1  . FLUoxetine (PROZAC) 20 MG capsule Take 20 mg by mouth daily.    . pantoprazole (PROTONIX) 40 MG tablet Take 1 tablet (40 mg total) by mouth 2 (two) times daily before a meal. (Patient taking differently: Take 40 mg by mouth daily. ) 180 tablet 3  . propranolol ER (INDERAL LA) 60  MG 24 hr capsule Take 80 mg by mouth at bedtime.     . SUMAtriptan (IMITREX) 50 MG tablet Take by mouth as needed.      No current facility-administered medications for this visit.     Allergies as of 09/02/2019 - Review Complete 09/02/2019  Allergen Reaction Noted  . Beef-derived products  03/24/2018  . Dilaudid [hydromorphone hcl] Nausea And Vomiting 10/10/2017  . Duloxetine Other (See Comments) 06/13/2018  . Other Hives 05/16/2018  . Sulfa antibiotics Nausea Only 07/20/2016  . Toradol [ketorolac tromethamine] Nausea Only 07/20/2016  . Vicodin [hydrocodone-acetaminophen] Itching 08/12/2015    Family History  Problem  Relation Age of Onset  . Cancer Mother        skin  . Depression Mother   . Hypertension Mother   . Hyperlipidemia Mother   . Diabetes Mother   . Cancer Father        skin  . Arthritis Father   . Hypertension Father   . Hyperlipidemia Father   . Asthma Brother   . Depression Maternal Grandmother   . Hypertension Maternal Grandmother   . Mental illness Maternal Grandmother   . Cancer Maternal Grandfather        kidney  . Hypertension Maternal Grandfather   . Kidney disease Maternal Grandfather   . Emphysema Paternal Grandmother   . Alzheimer's disease Paternal Grandmother   . Parkinson's disease Paternal Grandfather   . Colon cancer Neg Hx   . Colon polyps Neg Hx   . Allergic rhinitis Neg Hx   . Eczema Neg Hx   . Urticaria Neg Hx     Social History   Socioeconomic History  . Marital status: Married    Spouse name: Not on file  . Number of children: 4  . Years of education: Some College  . Highest education level: Not on file  Occupational History  . Not on file  Social Shah  . Financial resource strain: Not on file  . Food insecurity    Worry: Not on file    Inability: Not on file  . Transportation Shah    Medical: Not on file    Non-medical: Not on file  Tobacco Use  . Smoking status: Never Smoker  . Smokeless tobacco: Never Used  Substance and Sexual Activity  . Alcohol use: No    Alcohol/week: 0.0 standard drinks  . Drug use: No  . Sexual activity: Yes    Birth control/protection: Surgical    Comment: hyst  Lifestyle  . Physical activity    Days per week: Not on file    Minutes per session: Not on file  . Stress: Not on file  Relationships  . Social Musicianconnections    Talks on phone: Not on file    Gets together: Not on file    Attends religious service: Not on file    Active member of club or organization: Not on file    Attends meetings of clubs or organizations: Not on file    Relationship status: Not on file  Other Topics Concern  . Not on  file  Social History Narrative   Lives at home with husband and children.   Right-handed.   4 cups caffeine per week.    Review of Systems: Gen: Denies fever, chills, anorexia. Denies fatigue, weakness, weight loss.  CV: Denies chest pain, palpitations, syncope, peripheral edema, and claudication. Resp: Denies dyspnea at rest, cough, wheezing, coughing up blood, and pleurisy. GI: see HPI Derm: Denies rash, itching,  dry skin Psych: Denies depression, anxiety, memory loss, confusion. No homicidal or suicidal ideation.  Heme: Denies bruising, bleeding, and enlarged lymph nodes.  Physical Exam: BP (!) 141/99   Pulse 80   Temp (!) 97 F (36.1 C) (Oral)   Ht 5\' 3"  (1.6 m)   Wt 185 lb (83.9 kg)   BMI 32.77 kg/m  General:   Alert and oriented. No distress noted. Pleasant and cooperative.  Head:  Normocephalic and atraumatic. Eyes:  Conjuctiva clear without scleral icterus. Lungs: clear bilaterally Cardiac: S1 S2 present without murmurs Abdomen:  +BS, soft, mild TTP LUQ/epigastric and non-distended. No rebound or guarding. No HSM or masses noted. Msk:  Symmetrical without gross deformities. Normal posture. Extremities:  Without edema. Neurologic:  Alert and  oriented x4 Psych:  Alert and cooperative. Normal mood and affect.  ASSESSMENT: HAMDA KLUTTS is a 43 y.o. female presenting today with history of dysphagia in the past, last EGD in 2018, now with recurrent worsening dysphagia and regurgitation of food at times. Continues with Protonix daily. Will need EGD/dilation in the near future. As of note, prior manometry on file last year and BPE 2 years ago. Manometry unrevealing.   Proctalgia fugax: intermittent. Some improvement with diltiazem prescribed at last appt by another provider. No rectal bleeding. She will need an elective colonoscopy once  upper GI symptoms are addressed   PLAN:  Proceed with upper endoscopy/dilation in the near future with Dr. Oneida Alar. The risks, benefits,  and alternatives have been discussed in detail with patient. They have stated understanding and desire to proceed. PROPOFOL due to polypharmacy  Continue Protonix daily  Diet/behavior modifications with chewing well, sitting upright, taking small bites  Follow-up afterward EGD/dilation   Ruth Needs, PhD, ANP-BC [facility]

## 2019-09-02 ENCOUNTER — Ambulatory Visit (INDEPENDENT_AMBULATORY_CARE_PROVIDER_SITE_OTHER): Payer: Medicaid Other | Admitting: Gastroenterology

## 2019-09-02 ENCOUNTER — Telehealth: Payer: Self-pay

## 2019-09-02 ENCOUNTER — Other Ambulatory Visit: Payer: Self-pay

## 2019-09-02 ENCOUNTER — Encounter: Payer: Self-pay | Admitting: Gastroenterology

## 2019-09-02 VITALS — BP 141/99 | HR 80 | Temp 97.0°F | Ht 63.0 in | Wt 185.0 lb

## 2019-09-02 DIAGNOSIS — R1310 Dysphagia, unspecified: Secondary | ICD-10-CM

## 2019-09-02 NOTE — Patient Instructions (Signed)
We are arranging an upper endoscopy with dilation in the near future.  Follow soft foods, chew well, sit upright while eating.   We will see you in follow-up thereafter!   I enjoyed seeing you again today! As you know, I value our relationship and want to provide genuine, compassionate, and quality care. I welcome your feedback. If you receive a survey regarding your visit,  I greatly appreciate you taking time to fill this out. See you next time!  Annitta Needs, PhD, ANP-BC [facility]

## 2019-09-02 NOTE — Telephone Encounter (Signed)
Called pt, EGD/DIL w/Propofol w/SLF scheduled for 09/23/19 at 11:00am (per CM). Pt states she has appt to get steroid shot in her back 09/22/19. Orders entered.  FYI to AB.

## 2019-09-03 NOTE — Telephone Encounter (Signed)
COVID test 09/20/19, pre-op appt 09/22/19. Letter mailed with procedure instructions.

## 2019-09-10 ENCOUNTER — Ambulatory Visit (HOSPITAL_COMMUNITY)
Admission: RE | Admit: 2019-09-10 | Discharge: 2019-09-10 | Disposition: A | Discharge status: Home / Self Care | Payer: Medicaid Other | Source: Ambulatory Visit | Attending: Nurse Practitioner | Admitting: Nurse Practitioner

## 2019-09-10 ENCOUNTER — Other Ambulatory Visit: Payer: Self-pay

## 2019-09-10 DIAGNOSIS — R1310 Dysphagia, unspecified: Secondary | ICD-10-CM | POA: Insufficient documentation

## 2019-09-11 ENCOUNTER — Telehealth: Payer: Self-pay

## 2019-09-11 NOTE — Telephone Encounter (Signed)
Received a referral from [place] for an esophageal manometry with a diagnosis of dysphagia.  Called to the patient's listed phone number. No answer. Left a message to call back.

## 2019-09-20 ENCOUNTER — Other Ambulatory Visit: Payer: Self-pay

## 2019-09-20 ENCOUNTER — Other Ambulatory Visit (HOSPITAL_COMMUNITY)
Admission: RE | Admit: 2019-09-20 | Discharge: 2019-09-20 | Disposition: A | Discharge status: Home / Self Care | Payer: Medicaid Other | Source: Ambulatory Visit | Attending: Gastroenterology | Admitting: Gastroenterology

## 2019-09-20 DIAGNOSIS — Z20828 Contact with and (suspected) exposure to other viral communicable diseases: Secondary | ICD-10-CM | POA: Insufficient documentation

## 2019-09-20 DIAGNOSIS — Z01812 Encounter for preprocedural laboratory examination: Secondary | ICD-10-CM | POA: Diagnosis not present

## 2019-09-20 LAB — SARS CORONAVIRUS 2 (TAT 6-24 HRS): SARS Coronavirus 2: NEGATIVE

## 2019-09-22 ENCOUNTER — Encounter (HOSPITAL_COMMUNITY)
Admission: RE | Admit: 2019-09-22 | Discharge: 2019-09-22 | Disposition: A | Discharge status: Home / Self Care | Payer: Medicaid Other | Source: Ambulatory Visit | Attending: Gastroenterology | Admitting: Gastroenterology

## 2019-09-22 ENCOUNTER — Encounter: Payer: Medicaid Other | Admitting: Physical Medicine and Rehabilitation

## 2019-09-22 ENCOUNTER — Other Ambulatory Visit: Payer: Self-pay

## 2019-09-23 ENCOUNTER — Ambulatory Visit (HOSPITAL_COMMUNITY)
Admission: RE | Admit: 2019-09-23 | Discharge: 2019-09-23 | Disposition: A | Discharge status: Home / Self Care | Payer: Medicaid Other | Attending: Gastroenterology | Admitting: Gastroenterology

## 2019-09-23 ENCOUNTER — Encounter (HOSPITAL_COMMUNITY)
Admission: RE | Disposition: A | Discharge status: Home / Self Care | Payer: Self-pay | Source: Home / Self Care | Attending: Gastroenterology

## 2019-09-23 ENCOUNTER — Ambulatory Visit (HOSPITAL_COMMUNITY): Payer: Medicaid Other | Admitting: Anesthesiology

## 2019-09-23 ENCOUNTER — Encounter (HOSPITAL_COMMUNITY): Payer: Self-pay | Admitting: Gastroenterology

## 2019-09-23 DIAGNOSIS — K219 Gastro-esophageal reflux disease without esophagitis: Secondary | ICD-10-CM | POA: Diagnosis not present

## 2019-09-23 DIAGNOSIS — K222 Esophageal obstruction: Secondary | ICD-10-CM

## 2019-09-23 DIAGNOSIS — R1310 Dysphagia, unspecified: Secondary | ICD-10-CM | POA: Diagnosis not present

## 2019-09-23 DIAGNOSIS — Z79899 Other long term (current) drug therapy: Secondary | ICD-10-CM | POA: Diagnosis not present

## 2019-09-23 DIAGNOSIS — F329 Major depressive disorder, single episode, unspecified: Secondary | ICD-10-CM | POA: Diagnosis not present

## 2019-09-23 DIAGNOSIS — M1990 Unspecified osteoarthritis, unspecified site: Secondary | ICD-10-CM | POA: Diagnosis not present

## 2019-09-23 DIAGNOSIS — M797 Fibromyalgia: Secondary | ICD-10-CM | POA: Diagnosis not present

## 2019-09-23 HISTORY — PX: SAVORY DILATION: SHX5439

## 2019-09-23 HISTORY — PX: ESOPHAGOGASTRODUODENOSCOPY (EGD) WITH PROPOFOL: SHX5813

## 2019-09-23 HISTORY — PX: BIOPSY: SHX5522

## 2019-09-23 SURGERY — ESOPHAGOGASTRODUODENOSCOPY (EGD) WITH PROPOFOL
Anesthesia: General

## 2019-09-23 MED ORDER — LIDOCAINE VISCOUS HCL 2 % MT SOLN: 5.0000 mL | Freq: Once | OROMUCOSAL | Status: DC

## 2019-09-23 MED ORDER — LACTATED RINGERS IV SOLN: INTRAVENOUS | Status: DC | PRN

## 2019-09-23 MED ORDER — LIDOCAINE HCL (CARDIAC) PF 50 MG/5ML IV SOSY
PREFILLED_SYRINGE | INTRAVENOUS | Status: DC | PRN
  Administered 2019-09-23: 50 mg via INTRAVENOUS

## 2019-09-23 MED ORDER — LIDOCAINE VISCOUS HCL 2 % MT SOLN
OROMUCOSAL | Status: DC | PRN
  Administered 2019-09-23: 5 mL via OROMUCOSAL

## 2019-09-23 MED ORDER — CHLORHEXIDINE GLUCONATE CLOTH 2 % EX PADS: 6.0000 | MEDICATED_PAD | Freq: Once | CUTANEOUS | Status: DC

## 2019-09-23 MED ORDER — KETAMINE HCL 10 MG/ML IJ SOLN
INTRAMUSCULAR | Status: DC | PRN
  Administered 2019-09-23: 20 mg via INTRAVENOUS
  Administered 2019-09-23: 10 mg via INTRAVENOUS

## 2019-09-23 MED ORDER — PROPOFOL 500 MG/50ML IV EMUL
INTRAVENOUS | Status: DC | PRN
  Administered 2019-09-23: 200 ug/kg/min via INTRAVENOUS

## 2019-09-23 MED ORDER — MINERAL OIL PO OIL
TOPICAL_OIL | ORAL | Status: AC
  Filled 2019-09-23: qty 30

## 2019-09-23 MED ORDER — KETAMINE HCL 50 MG/5ML IJ SOSY
PREFILLED_SYRINGE | INTRAMUSCULAR | Status: AC
  Filled 2019-09-23: qty 5

## 2019-09-23 MED ORDER — GLYCOPYRROLATE 0.2 MG/ML IJ SOLN
INTRAMUSCULAR | Status: DC | PRN
  Administered 2019-09-23: .2 mg via INTRAVENOUS

## 2019-09-23 MED ORDER — PROPOFOL 10 MG/ML IV BOLUS
INTRAVENOUS | Status: DC | PRN
  Administered 2019-09-23: 40 mg via INTRAVENOUS

## 2019-09-23 MED ORDER — LIDOCAINE VISCOUS HCL 2 % MT SOLN
OROMUCOSAL | Status: AC
  Filled 2019-09-23: qty 15

## 2019-09-23 MED ORDER — LACTATED RINGERS IV SOLN
Freq: Once | INTRAVENOUS | Status: AC
  Administered 2019-09-23: 1000 mL via INTRAVENOUS

## 2019-09-23 NOTE — Op Note (Signed)
[facility] Patient Name: Ruth Shah Procedure Date: 09/23/2019 10:09 AM MRN: [id number] Date of Birth: 1976-02-20 Attending MD: Jonette Eva MD, MD CSN: [id number] Age: 43 Admit Type: Outpatient Procedure:                Upper GI endoscopy WITH COLD FORCEPS                            BIOPSY/ESOPHAGEAL DILATION Indications:              Dysphagia Providers:                Jonette Eva MD, MD, Loma Messing B. Patsy Lager, RN,                            Burke Keels, Technician Referring MD:             Selinda Flavin, MD Medicines:                Propofol per Anesthesia Complications:            No immediate complications. Estimated Blood Loss:     Estimated blood loss was minimal. Procedure:                Pre-Anesthesia Assessment:                           - Prior to the procedure, a History and Physical                            was performed, and patient medications and                            allergies were reviewed. The patient's tolerance of                            previous anesthesia was also reviewed. The risks                            and benefits of the procedure and the sedation                            options and risks were discussed with the patient.                            All questions were answered, and informed consent                            was obtained. Prior Anticoagulants: The patient has                            taken no previous anticoagulant or antiplatelet                            agents. ASA Grade Assessment: III - A patient with  severe systemic disease. After reviewing the risks                            and benefits, the patient was deemed in                            satisfactory condition to undergo the procedure.                            After obtaining informed consent, the endoscope was                            passed under direct vision. Throughout the                            procedure, the  patient's blood pressure, pulse, and                            oxygen saturations were monitored continuously. The                            GIF-H190 ([id number] scope was introduced through the                            mouth, and advanced to the second part of duodenum.                            The upper GI endoscopy was accomplished without                            difficulty. The patient tolerated the procedure                            well. Scope In: 10:48:17 AM Scope Out: 10:59:20 AM Total Procedure Duration: 0 hours 11 minutes 3 seconds  Findings:      One benign-appearing, intrinsic moderate (circumferential scarring or       stenosis; an endoscope may pass) stenosis was found AT THE EGJ 35 CM       FROM THE INCISORS. This stenosis measured 1.3 cm (inner diameter). The       stenosis was traversed. A guidewire was placed and the scope was       withdrawn. Dilation was performed with a Savary dilator with moderate       resistance at 14 mm, 15 mm and 16 mm. Biopsies were obtained from the       proximal(15 CM FROM THE INCISORS) and distal esophagus (30 CM FROM THE       INCISORS) with cold forceps for histology of suspected eosinophilic       esophagitis.      The entire examined stomach was normal.      The examined duodenum was normal. Impression:               - DYSPHAGIA DUE TO Benign-appearing esophageal  STRICTURE IN SETTING OF UNCONTROLLED GERD. Moderate Sedation:      Per Anesthesia Care Recommendation:           - Patient has a contact number available for                            emergencies. The signs and symptoms of potential                            delayed complications were discussed with the                            patient. Return to normal activities tomorrow.                            Written discharge instructions were provided to the                            patient.                           - AVOID REFLUX  TRIGGERS. Low fat diet. LOSE WEIGHT                            TO BMI < 30 FOLLOWING RECOMMENDATIONS FROM DR. MARK                            HYMAN.                           - Continue present medications. PROTONIX BID.                           - Await pathology results.                           - Return to GI office in 4 months. Procedure Code(s):        --- Professional ---                           [phone], Esophagogastroduodenoscopy, flexible,                            transoral; with insertion of guide wire followed by                            passage of dilator(s) through esophagus over guide                            wire                           51884, 59, Esophagogastroduodenoscopy, flexible,                            transoral; with biopsy, single or multiple Diagnosis Code(s):        ---  Professional ---                           K22.2, Esophageal obstruction                           R13.10, Dysphagia, unspecified CPT copyright 2019 American Medical Association. All rights reserved. The codes documented in this report are preliminary and upon coder review may  be revised to meet current compliance requirements. Jonette Eva, MD Jonette Eva MD, MD 09/23/2019 11:21:18 AM This report has been signed electronically. Number of Addenda: 0

## 2019-09-23 NOTE — Discharge Instructions (Signed)
I dilated your esophagus. You have a stricture near the base of your esophagus LIKELY DUE TO UNCONTROLLED REFLUX. I biopsied your ESOPHAGUS.   CONTINUE YOUR WEIGHT LOSS EFFORTS.  FOLLOW RECOMMENDATION OF DR. MARK HYMAN, "10 DAY DETOX DIET". WHILE I DO NOT WANT TO ALARM YOU, YOUR BODY MASS INDEX IS OVER 30 WHICH MEANS YOU ARE OBESE. OBESITY IS ASSOCIATED WITH AN INCREASED RISK FOR CIRRHOSIS AND ALL CANCERS, INCLUDING ESOPHAGEAL AND COLON CANCER. A WEIGHT OF 165 LBS OR LESS WILL KEEP YOUR BODY MASS INDEX(BMI) UNDER 30.  TO BUILD MUSCLE AND STRENGTH, PRACTICE CHAIR YOGA FOR 15-30 MINS 3 OR 4 TIMES A WEEK AND ADVANCE TO HATHA YOGA AS YOU GAIN MORE STRENGTH.  DRINK WATER TO KEEP YOUR URINE LIGHT YELLOW.  AVOID REFLUX TRIGGERS. SEE INFO BELOW.  STRICTLY FOLLOW A LOW FAT DIET. SEE INFO BELOW.   CONTINUE PROTONIX. TAKE 30 MINUTES PRIOR TO MEALS TWICE DAILY.   YOUR BIOPSY RESULTS WILL BE BACK IN 5 BUSINESS DAYS.  FOLLOW UP IN 4 MOS.   UPPER ENDOSCOPY AFTER CARE Read the instructions outlined below and refer to this sheet in the next week. These discharge instructions provide you with general information on caring for yourself after you leave the hospital. While your treatment has been planned according to the most current medical practices available, unavoidable complications occasionally occur. If you have any problems or questions after discharge, call DR. Aerilyn Slee, [phone].  ACTIVITY  You may resume your regular activity, but move at a slower pace for the next 24 hours.   Take frequent rest periods for the next 24 hours.   Walking will help get rid of the air and reduce the bloated feeling in your belly (abdomen).   No driving for 24 hours (because of the medicine (anesthesia) used during the test).   You may shower.   Do not sign any important legal documents or operate any machinery for 24 hours (because of the anesthesia used during the test).    NUTRITION  Drink plenty of  fluids.   You may resume your normal diet as instructed by your doctor.   Begin with a light meal and progress to your normal diet. Heavy or fried foods are harder to digest and may make you feel sick to your stomach (nauseated).   Avoid alcoholic beverages for 24 hours or as instructed.    MEDICATIONS  You may resume your normal medications.   WHAT YOU CAN EXPECT TODAY  Some feelings of bloating in the abdomen.   Passage of more gas than usual.    IF YOU HAD A BIOPSY TAKEN DURING THE UPPER ENDOSCOPY:  Eat a soft diet IF YOU HAVE NAUSEA, BLOATING, ABDOMINAL PAIN, OR VOMITING.    FINDING OUT THE RESULTS OF YOUR TEST Not all test results are available during your visit. DR. Oneida Alar WILL CALL YOU WITHIN 14 DAYS OF YOUR PROCEDUE WITH YOUR RESULTS. Do not assume everything is normal if you have not heard from DR. Briselda Naval, CALL HER OFFICE AT [phone].  SEEK IMMEDIATE MEDICAL ATTENTION AND CALL THE OFFICE: [phone] IF:  You have more than a spotting of blood in your stool.   Your belly is swollen (abdominal distention).   You are nauseated or vomiting.   You have a temperature over 101F.   You have abdominal pain or discomfort that is severe or gets worse throughout the day.    ESOPHAGEAL STRICTURE  Esophageal strictures can be caused by stomach acid backing up into the tube  that carries food from the mouth down to the stomach (lower esophagus).  TREATMENT There are a number of medicines used to treat reflux/stricture, including: Antacids.  Proton-pump inhibitors: PROTONIX PEPCID OR TAGAMET      Lifestyle and home remedies TO CONTROL REFLUX/HEARTBURN You may eliminate or reduce the frequency of heartburn by making the following lifestyle changes:  . Control your weight. Being overweight is a major risk factor for heartburn and GERD. Excess pounds put pressure on your abdomen, pushing up your stomach and causing acid to back up into your esophagus.   . Eat  smaller meals. 4 TO 6 MEALS A DAY. This reduces pressure on the lower esophageal sphincter, helping to prevent the valve from opening and acid from washing back into your esophagus.   Allena Earing your belt. Clothes that fit tightly around your waist put pressure on your abdomen and the lower esophageal sphincter.   . Eliminate heartburn triggers. Everyone has specific triggers. Common triggers such as fatty or fried foods, spicy food, tomato sauce, carbonated beverages, alcohol, chocolate, mint, garlic, onion, caffeine and nicotine may make heartburn worse.   Marland Kitchen Avoid stooping or bending. Tying your shoes is OK. Bending over for longer periods to weed your garden isn't, especially soon after eating.   . Don't lie down after a meal. Wait at least three to four hours after eating before going to bed, and don't lie down right after eating.    Alternative medicine . Several home remedies exist for treating GERD, but they provide only temporary relief. They include drinking baking soda (sodium bicarbonate) added to water or drinking other fluids such as baking soda mixed with cream of tartar and water.  . Although these liquids create temporary relief by neutralizing, washing away or buffering acids, eventually they aggravate the situation by adding gas and fluid to your stomach, increasing pressure and causing more acid reflux. Further, adding more sodium to your diet may increase your blood pressure and add stress to your heart, and excessive bicarbonate ingestion can alter the acid-base balance in your body.

## 2019-09-23 NOTE — H&P (Signed)
Primary Care Physician:  Rory Percy, MD Primary Gastroenterologist:  Dr. Oneida Alar  Pre-Procedure History & Physical: HPI:  Ruth Shah is a 43 y.o. female here for Gas.  Past Medical History:  Diagnosis Date  . ADD (attention deficit disorder)   . Arthritis   . Asthma    mild seasonal reactions to mold and mildew  . Back pain   . Depression   . Fibromyalgia   . GERD (gastroesophageal reflux disease)   . Herpes simplex disease   . History of kidney stones   . Migraines   . Paresthesia of both hands   . PONV (postoperative nausea and vomiting)   . Urticaria     Past Surgical History:  Procedure Laterality Date  . ENDOMETRIAL ABLATION    . ESOPHAGEAL MANOMETRY N/A 10/15/2017   Procedure: ESOPHAGEAL MANOMETRY (EM);  Surgeon: Mauri Pole, MD;  Location: [facility];  Service: Endoscopy;  Laterality: N/A;  . ESOPHAGOGASTRODUODENOSCOPY (EGD) WITH PROPOFOL N/A 12/26/2016   Dr. Oneida Alar: one benign-appearing, intrinsic stenosis that was widely patent and traversed. Small hiatal hernia, few small sessile polyps. Chronic gastritis. Normal duodenum, negative celiac sprue.   Marland Kitchen HERNIA REPAIR     inguinal as a baby   . left ovarian removal due to cyst    . PILONIDAL CYST / SINUS EXCISION  2009  . RECTOCELE REPAIR N/A 10/02/2017   Procedure: POSTERIOR REPAIR (RECTOCELE);  Surgeon: Jonnie Kind, MD;  Location: [facility];  Service: Gynecology;  Laterality: N/A;  . SINOSCOPY    . TUBAL LIGATION    . VAGINAL HYSTERECTOMY N/A 10/02/2017   Procedure: HYSTERECTOMY VAGINAL;  Surgeon: Jonnie Kind, MD;  Location: [facility];  Service: Gynecology;  Laterality: N/A;    Prior to Admission medications   Medication Sig Start Date End Date Taking? Authorizing Provider  b complex vitamins tablet Take 1 tablet by mouth daily.   Yes [provider]  Cholecalciferol (VITAMIN D) 125 MCG (5000 UT) CAPS Take 5,000 Units by mouth daily.   Yes [provider]  FLUoxetine  (PROZAC) 20 MG capsule Take 20 mg by mouth daily. 11/07/18  Yes [provider]  methylphenidate 18 MG PO CR tablet Take 18 mg by mouth daily.   Yes [provider]  pantoprazole (PROTONIX) 40 MG tablet Take 1 tablet (40 mg total) by mouth 2 (two) times daily before a meal. 09/03/18  Yes Carlis Stable, NP  propranolol ER (INDERAL LA) 80 MG 24 hr capsule Take 80 mg by mouth at bedtime.  01/13/19  Yes [provider]  dicyclomine (BENTYL) 10 MG capsule Take 1 capsule (10 mg total) by mouth 4 (four) times daily as needed for spasms (abdominal pain or soft/loose stools). Patient not taking: Reported on 09/10/2019 06/17/19   Carlis Stable, NP  diltiazem 2 % GEL Apply 1 application topically 2 (two) times daily as needed (rectal spasm. Max twice daily for up to 10 days at a time). Patient not taking: Reported on 09/10/2019 08/05/19   Carlis Stable, NP  SUMAtriptan (IMITREX) 50 MG tablet Take 50 mg by mouth every 2 (two) hours as needed for migraine.     [provider]    Allergies as of 09/02/2019 - Review Complete 09/02/2019  Allergen Reaction Noted  . Beef-derived products  03/24/2018  . Dilaudid [hydromorphone hcl] Nausea And Vomiting 10/10/2017  . Duloxetine Other (See Comments) 06/13/2018  . Other Hives 05/16/2018  . Sulfa antibiotics Nausea Only 07/20/2016  .  Toradol [ketorolac tromethamine] Nausea Only 07/20/2016  . Vicodin [hydrocodone-acetaminophen] Itching 08/12/2015    Family History  Problem Relation Age of Onset  . Cancer Mother        skin  . Depression Mother   . Hypertension Mother   . Hyperlipidemia Mother   . Diabetes Mother   . Cancer Father        skin  . Arthritis Father   . Hypertension Father   . Hyperlipidemia Father   . Asthma Brother   . Depression Maternal Grandmother   . Hypertension Maternal Grandmother   . Mental illness Maternal Grandmother   . Cancer Maternal Grandfather        kidney  . Hypertension Maternal  Grandfather   . Kidney disease Maternal Grandfather   . Emphysema Paternal Grandmother   . Alzheimer's disease Paternal Grandmother   . Parkinson's disease Paternal Grandfather   . Colon cancer Neg Hx   . Colon polyps Neg Hx   . Allergic rhinitis Neg Hx   . Eczema Neg Hx   . Urticaria Neg Hx     Social History   Socioeconomic History  . Marital status: Married    Spouse name: Not on file  . Number of children: 4  . Years of education: Some College  . Highest education level: Not on file  Occupational History  . Not on file  Tobacco Use  . Smoking status: Never Smoker  . Smokeless tobacco: Never Used  Substance and Sexual Activity  . Alcohol use: No    Alcohol/week: 0.0 standard drinks  . Drug use: No  . Sexual activity: Yes    Birth control/protection: Surgical    Comment: hyst  Other Topics Concern  . Not on file  Social History Narrative   Lives at home with husband and children.   Right-handed.   4 cups caffeine per week.   Social Determinants of Health   Financial Resource Strain:   . Difficulty of Paying Living Expenses: Not on file  Food Insecurity:   . Worried About Programme researcher, broadcasting/film/videounning Out of Food in the Last Year: Not on file  . Ran Out of Food in the Last Year: Not on file  Transportation Needs:   . Lack of Transportation (Medical): Not on file  . Lack of Transportation (Non-Medical): Not on file  Physical Activity:   . Days of Exercise per Week: Not on file  . Minutes of Exercise per Session: Not on file  Stress:   . Feeling of Stress : Not on file  Social Connections:   . Frequency of Communication with Friends and Family: Not on file  . Frequency of Social Gatherings with Friends and Family: Not on file  . Attends Religious Services: Not on file  . Active Member of Clubs or Organizations: Not on file  . Attends BankerClub or Organization Meetings: Not on file  . Marital Status: Not on file  Intimate Partner Violence:   . Fear of Current or Ex-Partner: Not on  file  . Emotionally Abused: Not on file  . Physically Abused: Not on file  . Sexually Abused: Not on file    Review of Systems: See HPI, otherwise negative ROS   Physical Exam: BP (!) 158/98   Pulse 76   Temp 98.3 F (36.8 C) (Oral)   Resp 20   SpO2 98%  General:   Alert,  pleasant and cooperative in NAD Head:  Normocephalic and atraumatic. Neck:  Supple; Lungs:  Clear throughout to auscultation.  Heart:  Regular rate and rhythm. Abdomen:  Soft, nontender and nondistended. Normal bowel sounds, without guarding, and without rebound.   Neurologic:  Alert and  oriented x4;  grossly normal neurologically.  Impression/Plan:     DYSPHAGIA  PLAN:  EGD/DIL TODAY.  DISCUSSED PROCEDURE, BENEFITS, & RISKS: < 1% chance of medication reaction, bleeding, perforation, or ASPIRATION.

## 2019-09-23 NOTE — Anesthesia Preprocedure Evaluation (Signed)
Anesthesia Evaluation  Patient identified by MRN, date of birth, ID band Patient awake    Reviewed: Allergy & Precautions, NPO status , Patient's Chart, lab work & pertinent test results  History of Anesthesia Complications (+) PONV and history of anesthetic complications  Airway Mallampati: II  TM Distance: >3 FB Neck ROM: Full    Dental no notable dental hx.    Pulmonary asthma ,    Pulmonary exam normal breath sounds clear to auscultation       Cardiovascular Exercise Tolerance: Good  Rhythm:Regular Rate:Normal     Neuro/Psych  Headaches, PSYCHIATRIC DISORDERS Depression  Neuromuscular disease    GI/Hepatic hiatal hernia, GERD  Medicated and Poorly Controlled,  Endo/Other    Renal/GU      Musculoskeletal  (+) Arthritis , Fibromyalgia -  Abdominal   Peds  Hematology   Anesthesia Other Findings ADHD  Reproductive/Obstetrics                             Anesthesia Physical Anesthesia Plan  ASA: II  Anesthesia Plan: General   Post-op Pain Management:    Induction: Intravenous  PONV Risk Score and Plan: 0 and TIVA  Airway Management Planned: Nasal Cannula, Natural Airway and Simple Face Mask  Additional Equipment:   Intra-op Plan:   Post-operative Plan:   Informed Consent: I have reviewed the patients History and Physical, chart, labs and discussed the procedure including the risks, benefits and alternatives for the proposed anesthesia with the patient or authorized representative who has indicated his/her understanding and acceptance.     Dental advisory given  Plan Discussed with: CRNA  Anesthesia Plan Comments: (Possible GA with ETT was discussed)        Anesthesia Quick Evaluation

## 2019-09-23 NOTE — Transfer of Care (Signed)
Immediate Anesthesia Transfer of Care Note  Patient: Ruth Shah  Procedure(s) Performed: ESOPHAGOGASTRODUODENOSCOPY (EGD) WITH PROPOFOL (N/A ) SAVORY DILATION (N/A ) BIOPSY  Patient Location: PACU  Anesthesia Type:General  Level of Consciousness: awake and patient cooperative  Airway & Oxygen Therapy: Patient Spontanous Breathing  Post-op Assessment: Report given to RN and Post -op Vital signs reviewed and stable  Post vital signs: Reviewed and stable  Last Vitals:  Vitals Value Taken Time  BP    Temp    Pulse 90 09/23/19 1108  Resp    SpO2 97 % 09/23/19 1108  Vitals shown include unvalidated device data.  Last Pain:  Vitals:   09/23/19 1042  TempSrc:   PainSc: 0-No pain      Patients Stated Pain Goal: 9 (45/03/88 8280)  Complications: No apparent anesthesia complications  SEE PACU FLOW SHEET FOR VITAL SIGNS

## 2019-09-23 NOTE — Anesthesia Procedure Notes (Signed)
Date/Time: 09/23/2019 10:38 AM Performed by: Vista Deck, CRNA Pre-anesthesia Checklist: Patient identified, Emergency Drugs available, Suction available, Timeout performed and Patient being monitored Patient Re-evaluated:Patient Re-evaluated prior to induction Oxygen Delivery Method: Nasal Cannula

## 2019-09-23 NOTE — Anesthesia Postprocedure Evaluation (Signed)
Anesthesia Post Note  Patient: Ruth Shah  Procedure(s) Performed: ESOPHAGOGASTRODUODENOSCOPY (EGD) WITH PROPOFOL (N/A ) SAVORY DILATION (N/A ) BIOPSY  Patient location during evaluation: PACU Anesthesia Type: General Level of consciousness: awake and alert and patient cooperative Pain management: satisfactory to patient Vital Signs Assessment: post-procedure vital signs reviewed and stable Respiratory status: spontaneous breathing Cardiovascular status: stable Postop Assessment: no apparent nausea or vomiting Anesthetic complications: no     Last Vitals:  Vitals:   09/23/19 1109 09/23/19 1115  BP: (!) 145/94 (!) 144/107  Pulse: 88 84  Resp: 15 16  Temp:    SpO2: 96% 95%    Last Pain:  Vitals:   09/23/19 1106  TempSrc:   PainSc: (P) 0-No pain                 Deetya Drouillard

## 2019-09-23 NOTE — Telephone Encounter (Signed)
Left message to return my call.  

## 2019-09-24 ENCOUNTER — Other Ambulatory Visit: Payer: Self-pay

## 2019-09-24 ENCOUNTER — Encounter: Payer: Self-pay | Admitting: Gastroenterology

## 2019-09-24 LAB — SURGICAL PATHOLOGY

## 2019-09-24 MED ORDER — PANTOPRAZOLE SODIUM 40 MG PO TBEC: 40.0000 mg | DELAYED_RELEASE_TABLET | Freq: Two times a day (BID) | ORAL | 1 refills | Status: DC

## 2019-09-24 NOTE — Progress Notes (Signed)
Patient scheduled and letter sent  °

## 2019-10-01 ENCOUNTER — Encounter: Payer: Medicaid Other | Admitting: Physical Medicine and Rehabilitation

## 2019-10-06 NOTE — Telephone Encounter (Signed)
Due to the current COVID pandemic, elective procedures cannot be scheduled.  Patient is aware of this.

## 2019-10-08 ENCOUNTER — Ambulatory Visit: Payer: Medicaid Other | Admitting: Nurse Practitioner

## 2019-10-14 ENCOUNTER — Encounter: Payer: Medicaid Other | Admitting: Physical Medicine and Rehabilitation

## 2019-10-16 ENCOUNTER — Other Ambulatory Visit: Payer: Self-pay

## 2019-10-16 ENCOUNTER — Ambulatory Visit (INDEPENDENT_AMBULATORY_CARE_PROVIDER_SITE_OTHER): Payer: Medicaid Other | Admitting: Physical Medicine and Rehabilitation

## 2019-10-16 ENCOUNTER — Encounter: Payer: Self-pay | Admitting: Physical Medicine and Rehabilitation

## 2019-10-16 ENCOUNTER — Ambulatory Visit: Payer: Self-pay

## 2019-10-16 VITALS — BP 135/87 | HR 77

## 2019-10-16 DIAGNOSIS — M5416 Radiculopathy, lumbar region: Secondary | ICD-10-CM | POA: Diagnosis not present

## 2019-10-16 DIAGNOSIS — M25551 Pain in right hip: Secondary | ICD-10-CM

## 2019-10-16 DIAGNOSIS — M5116 Intervertebral disc disorders with radiculopathy, lumbar region: Secondary | ICD-10-CM | POA: Insufficient documentation

## 2019-10-16 DIAGNOSIS — R202 Paresthesia of skin: Secondary | ICD-10-CM | POA: Diagnosis not present

## 2019-10-16 MED ORDER — METHYLPREDNISOLONE ACETATE 80 MG/ML IJ SUSP: 40.0000 mg | Freq: Once | INTRAMUSCULAR | Status: DC

## 2019-10-16 NOTE — Progress Notes (Signed)
Pt states pain in the middle of the lower back and sometimes radiates into the right leg all the way down with numbness in right leg and right foot. Pt states last injection in 2019 helped, but did not last that long. Standing and bending makes pain worse, nothing helps with pain.   .Numeric Pain Rating Scale and Functional Assessment Average Pain 5   In the last MONTH (on 0-10 scale) has pain interfered with the following?  1. General activity like being  able to carry out your everyday physical activities such as walking, climbing stairs, carrying groceries, or moving a chair?  Rating(7)   +Driver, -BT, -Dye Allergies.

## 2019-10-22 ENCOUNTER — Encounter (HOSPITAL_COMMUNITY): Payer: Self-pay

## 2019-10-22 ENCOUNTER — Ambulatory Visit (HOSPITAL_COMMUNITY): Admit: 2019-10-22 | Payer: Medicaid Other | Admitting: Gastroenterology

## 2019-10-22 SURGERY — MANOMETRY, ESOPHAGUS
Anesthesia: Choice

## 2019-11-06 ENCOUNTER — Telehealth: Payer: Self-pay

## 2019-11-06 NOTE — Telephone Encounter (Signed)
Contacted the patient about her manometry referral.  She states she is doing much better. She declines referral for amnometry.

## 2020-01-06 ENCOUNTER — Other Ambulatory Visit: Payer: Self-pay

## 2020-01-06 ENCOUNTER — Encounter: Payer: Self-pay | Admitting: Nurse Practitioner

## 2020-01-06 ENCOUNTER — Ambulatory Visit: Payer: Medicaid Other | Admitting: Nurse Practitioner

## 2020-01-06 VITALS — BP 145/89 | HR 96 | Temp 97.5°F | Ht 63.0 in | Wt 191.4 lb

## 2020-01-06 DIAGNOSIS — E663 Overweight: Secondary | ICD-10-CM

## 2020-01-06 DIAGNOSIS — R109 Unspecified abdominal pain: Secondary | ICD-10-CM | POA: Diagnosis not present

## 2020-01-06 DIAGNOSIS — K6289 Other specified diseases of anus and rectum: Secondary | ICD-10-CM | POA: Diagnosis not present

## 2020-01-06 DIAGNOSIS — K219 Gastro-esophageal reflux disease without esophagitis: Secondary | ICD-10-CM | POA: Diagnosis not present

## 2020-01-06 DIAGNOSIS — R1310 Dysphagia, unspecified: Secondary | ICD-10-CM | POA: Diagnosis not present

## 2020-01-06 MED ORDER — PANTOPRAZOLE SODIUM 40 MG PO TBEC: 40.0000 mg | DELAYED_RELEASE_TABLET | Freq: Two times a day (BID) | ORAL | 1 refills | Status: DC

## 2020-01-06 NOTE — Assessment & Plan Note (Signed)
Having some recurrent dysphagia symptoms which occur about once a day.  Not as bad as it was previously.  No regurgitation or vomiting.  EGD just recently completed with dilation about 4 months ago.  Esophageal biopsies unremarkable.  At this point I will proceed with a BPE to see if she has recurrent Schatzki's ring versus functional disorder.  Further recommendations for possible EGD to follow BPE results.  Of note, she does admit to eating too fast, although she does chew adequately.  Further recommendations to follow.

## 2020-01-06 NOTE — Assessment & Plan Note (Signed)
GERD symptoms currently well managed.  I have refilled her PPI.  Continue current medications and follow-up in 3 months.

## 2020-01-06 NOTE — Assessment & Plan Note (Signed)
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  Topical nitroglycerin is on hand, but difficulty use because she cannot anticipate onset of symptoms and they are typically brief reducing the likelihood that she can use the medication.  She will eventually need endoscopic evaluation with a colonoscopy, however being that she may need an upper endoscopy with dilation we will wait to see if these can be combined.  Further recommendations to follow in 3 months.

## 2020-01-06 NOTE — Progress Notes (Signed)
Referring Provider: Selinda Flavin, MD Primary Care Physician:  Selinda Flavin, MD Primary GI:  Dr. Darrick Penna  Chief Complaint  Patient presents with  . Abdominal Pain    improved  . Diarrhea  . Dysphagia    not as bad as prior to esophagus being stretched    HPI:   Ruth Shah is a 44 y.o. female who presents for follow-up on abdominal pain.  The patient was last seen in our office 09/02/2019 for dysphagia.  At that time noted history of GERD, dysphagia, diarrhea, abdominal pain with recent EGD in April 2018.  BPE on November 2018 with small partial Schatzki's ring without obstruction and noted episode of dysmotility.  Manometry in 2019 with normal relaxation of the EG junction without significant esophageal peristaltic abnormality.  At her last visit she noted worsening dysphagia with vomiting 4 times yesterday, food gets stuck at which point she takes a drink and drink she states in her esophagus this is worsened over the past month.  Protonix daily.  Also rectal spasms and is used diltiazem once, notes chronic intermittent loose stool with no rectal bleeding and no prior colonoscopy.  Stools chronically "soft serve".  Recommended EGD with dilation, continue Protonix, chewing/swallowing precautions.  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  Recommended elective colonoscopy once UTI symptoms addressed.  Of note, at a previous visit in November 2020 she complained of abdominal pain and constipation.  Normal HIDA scan.  Notes bowel movement with her abdominal pain and generally Bristol 5.  Previously trialed on Bentyl.  In November her abdominal pain was significantly improved, some flare if she exerts herself.  Bentyl caused constipation.  Has tried Imodium which did not help, still with anal leakage about 3-4 times a week.  Intermittent rectal spasms which typically self resolved.  At that point recommended probiotics with follow-up in 1 month if no  improvement, rectal manometry for anal leakage, Cardizem topical for rectal spasms.  Today she states she's doing ok overall. Abdominal pain (more "side pain") only really occurs if she over-exerts herself and generally improves with stretching. She has fibromyalgia, and has struggling to lose weight. If she "does too much I hurt, do too little and I hurt." All she drinks is milk. Thinks she is having some progressive dysphagia, today feels a globus type sensation. Has solid food dysphagia once daily (typically breads, meats); following chewing precautions but is an admittedly fast eater. Still with soft stools, which is chronic for her and she states this is tolerable for her and not needed any real treatment at this time. Also with rectal spasms which occurs about 2-5 minutes; has tried Cardizem but generally doesn't have it with her/able to use quick enough to be effective. Denies abdominal pain, N/V, hematochezia, melena, fever, chills, unintentional weight loss. Denies URI or flu-like symptoms. Denies loss of sense of taste or smell. Has not had a COVID-19 vaccine and isn't wanting information on it just yet. Denies chest pain, dyspnea, dizziness, lightheadedness, syncope, near syncope. Denies any other upper or lower GI symptoms.  Needs a refill of Protonix, works well for her.  Past Medical History:  Diagnosis Date  . ADD (attention deficit disorder)   . Arthritis   . Asthma    mild seasonal reactions to mold and mildew  . Back pain   . Depression   . Fibromyalgia   . GERD (gastroesophageal reflux disease)   . Herpes simplex disease   . History of  kidney stones   . Migraines   . Paresthesia of both hands   . PONV (postoperative nausea and vomiting)   . Urticaria     Past Surgical History:  Procedure Laterality Date  . BIOPSY  09/23/2019   Procedure: BIOPSY;  Surgeon: West Bali, MD;  Location: [facility];  Service: Endoscopy;;  esophagus  . ENDOMETRIAL ABLATION    .  ESOPHAGEAL MANOMETRY N/A 10/15/2017   Procedure: ESOPHAGEAL MANOMETRY (EM);  Surgeon: Napoleon Form, MD;  Location: [facility];  Service: Endoscopy;  Laterality: N/A;  . ESOPHAGOGASTRODUODENOSCOPY (EGD) WITH PROPOFOL N/A 12/26/2016   Dr. Darrick Penna: one benign-appearing, intrinsic stenosis that was widely patent and traversed. Small hiatal hernia, few small sessile polyps. Chronic gastritis. Normal duodenum, negative celiac sprue.   . ESOPHAGOGASTRODUODENOSCOPY (EGD) WITH PROPOFOL N/A 09/23/2019   Procedure: ESOPHAGOGASTRODUODENOSCOPY (EGD) WITH PROPOFOL;  Surgeon: West Bali, MD;  Location: [facility];  Service: Endoscopy;  Laterality: N/A;  11:00am  . HERNIA REPAIR     inguinal as a baby   . left ovarian removal due to cyst    . PILONIDAL CYST / SINUS EXCISION  2009  . RECTOCELE REPAIR N/A 10/02/2017   Procedure: POSTERIOR REPAIR (RECTOCELE);  Surgeon: Tilda Burrow, MD;  Location: [facility];  Service: Gynecology;  Laterality: N/A;  . SAVORY DILATION N/A 09/23/2019   Procedure: SAVORY DILATION;  Surgeon: West Bali, MD;  Location: [facility];  Service: Endoscopy;  Laterality: N/A;  . SINOSCOPY    . TUBAL LIGATION    . VAGINAL HYSTERECTOMY N/A 10/02/2017   Procedure: HYSTERECTOMY VAGINAL;  Surgeon: Tilda Burrow, MD;  Location: [facility];  Service: Gynecology;  Laterality: N/A;    Current Outpatient Medications  Medication Sig Dispense Refill  . FLUoxetine (PROZAC) 20 MG capsule Take 20 mg by mouth daily.    . methylphenidate 18 MG PO CR tablet Take 18 mg by mouth daily.    . pantoprazole (PROTONIX) 40 MG tablet Take 1 tablet (40 mg total) by mouth 2 (two) times daily. 180 tablet 1  . propranolol ER (INDERAL LA) 80 MG 24 hr capsule Take 80 mg by mouth at bedtime.     . SUMAtriptan (IMITREX) 50 MG tablet Take 50 mg by mouth every 2 (two) hours as needed for migraine.      Current Facility-Administered Medications  Medication Dose Route Frequency Provider Last Rate Last Admin   . methylPREDNISolone acetate (DEPO-MEDROL) injection 40 mg  40 mg Other Once Tyrell Antonio, MD        Allergies as of 01/06/2020 - Review Complete 01/06/2020  Allergen Reaction Noted  . Beef-derived products  03/24/2018  . Dilaudid [hydromorphone hcl] Nausea And Vomiting 10/10/2017  . Duloxetine Other (See Comments) 06/13/2018  . Other Hives 05/16/2018  . Sulfa antibiotics Nausea Only 07/20/2016  . Toradol [ketorolac tromethamine] Nausea Only 07/20/2016  . Vicodin [hydrocodone-acetaminophen] Itching 08/12/2015    Family History  Problem Relation Age of Onset  . Cancer Mother        skin  . Depression Mother   . Hypertension Mother   . Hyperlipidemia Mother   . Diabetes Mother   . Cancer Father        skin  . Arthritis Father   . Hypertension Father   . Hyperlipidemia Father   . Asthma Brother   . Depression Maternal Grandmother   . Hypertension Maternal Grandmother   . Mental illness Maternal Grandmother   . Cancer Maternal Grandfather  kidney  . Hypertension Maternal Grandfather   . Kidney disease Maternal Grandfather   . Emphysema Paternal Grandmother   . Alzheimer's disease Paternal Grandmother   . Parkinson's disease Paternal Grandfather   . Colon cancer Neg Hx   . Colon polyps Neg Hx   . Allergic rhinitis Neg Hx   . Eczema Neg Hx   . Urticaria Neg Hx     Social History   Socioeconomic History  . Marital status: Married    Spouse name: Not on file  . Number of children: 4  . Years of education: Some College  . Highest education level: Not on file  Occupational History  . Not on file  Tobacco Use  . Smoking status: Never Smoker  . Smokeless tobacco: Never Used  Substance and Sexual Activity  . Alcohol use: No    Alcohol/week: 0.0 standard drinks  . Drug use: No  . Sexual activity: Yes    Birth control/protection: Surgical    Comment: hyst  Other Topics Concern  . Not on file  Social History Narrative   Lives at home with husband and  children.   Right-handed.   4 cups caffeine per week.   Social Determinants of Health   Financial Resource Strain:   . Difficulty of Paying Living Expenses:   Food Insecurity:   . Worried About Charity fundraiser in the Last Year:   . Arboriculturist in the Last Year:   Transportation Needs:   . Film/video editor (Medical):   Marland Kitchen Lack of Transportation (Non-Medical):   Physical Activity:   . Days of Exercise per Week:   . Minutes of Exercise per Session:   Stress:   . Feeling of Stress :   Social Connections:   . Frequency of Communication with Friends and Family:   . Frequency of Social Gatherings with Friends and Family:   . Attends Religious Services:   . Active Member of Clubs or Organizations:   . Attends Archivist Meetings:   Marland Kitchen Marital Status:     Subjective: Review of Systems  Constitutional: Negative for chills, fever, malaise/fatigue and weight loss.       Weight gain  HENT: Negative for congestion and sore throat.   Respiratory: Negative for cough and shortness of breath.   Cardiovascular: Negative for chest pain and palpitations.  Gastrointestinal: Positive for abdominal pain (More "side pain" likely musculoskelatal) and diarrhea (Bristol 5-6; tolerable, no treatment wanted). Negative for blood in stool, heartburn (on PPI), melena, nausea and vomiting.       +dysphagia  Musculoskeletal: Negative for joint pain and myalgias.  Skin: Negative for rash.  Neurological: Negative for dizziness and weakness.  Endo/Heme/Allergies: Does not bruise/bleed easily.  Psychiatric/Behavioral: Negative for depression. The patient is not nervous/anxious.        Increased stress  All other systems reviewed and are negative.    Objective: BP (!) 145/89   Pulse 96   Temp (!) 97.5 F (36.4 C) (Oral)   Ht 5\' 3"  (1.6 m)   Wt 191 lb 6.4 oz (86.8 kg)   BMI 33.90 kg/m  Physical Exam Vitals and nursing note reviewed.  Constitutional:      General: She is not in  acute distress.    Appearance: Normal appearance. She is well-developed. She is obese. She is not ill-appearing, toxic-appearing or diaphoretic.  HENT:     Head: Normocephalic and atraumatic.     Nose: No congestion or rhinorrhea.  Eyes:  General: No scleral icterus. Cardiovascular:     Rate and Rhythm: Normal rate and regular rhythm.     Heart sounds: Normal heart sounds.  Pulmonary:     Effort: Pulmonary effort is normal. No respiratory distress.     Breath sounds: Normal breath sounds.  Abdominal:     General: Bowel sounds are normal.     Palpations: Abdomen is soft. There is no hepatomegaly, splenomegaly or mass.     Tenderness: There is no abdominal tenderness. There is no guarding or rebound.     Hernia: No hernia is present.  Skin:    General: Skin is warm and dry.     Coloration: Skin is not jaundiced.     Findings: No rash.  Neurological:     General: No focal deficit present.     Mental Status: She is alert and oriented to person, place, and time.  Psychiatric:        Attention and Perception: Attention normal.        Mood and Affect: Mood normal.        Speech: Speech normal.        Behavior: Behavior normal.        Thought Content: Thought content normal.        Cognition and Memory: Cognition and memory normal.       01/06/2020 11:16 AM   Disclaimer: This note was dictated with voice recognition software. Similar sounding words can inadvertently be transcribed and may not be corrected upon review.

## 2020-01-06 NOTE — Patient Instructions (Signed)
Your health issues we discussed today were:   GERD (reflux/heartburn) with dysphagia (swallowing difficulties): 1. We will help schedule your swallowing test for you 2. Continue taking your current acid blockers 3. Call us if you have any worsening reflux symptoms 4. If something gets stuck when eating and will not go forward or backward, last more than 2 hours, then proceed to the emergency department 5. Try to take your time eating slowly.  Make a conscious effort to do so  Rectal pain: 1. We will likely need to complete a colonoscopy to further evaluate your rectal pain 2. It is not uncommon for patients to not be able to use the topical creams because of the scarcity of the episodes as well as the unpredictability 3. Further recommendations will follow.  Overall I recommend:  1. Continue your current medications otherwise 2. Return for follow-up in 3 months 3. Call us if you have any questions or concerns.   At [facility] we value your feedback. You may receive a survey about your visit today. Please share your experience as we strive to create trusting relationships with our patients to provide genuine, compassionate, quality care.  We appreciate your understanding and patience as we review any laboratory studies, imaging, and other diagnostic tests that are ordered as we care for you. Our office policy is 5 business days for review of these results, and any emergent or urgent results are addressed in a timely manner for your best interest. If you do not hear from our office in 1 week, please contact us.   We also encourage the use of MyChart, which contains your medical information for your review as well. If you are not enrolled in this feature, an access code is on this after visit summary for your convenience. Thank you for allowing [place] to be involved in your care.  It was great to see you today!  I hope you have a great Summer!!

## 2020-01-06 NOTE — Assessment & Plan Note (Signed)
"  Abdominal pain" more consistent with right side pain.  She states this generally occurs if she is exerting herself and is typically relieved with stretching.  More likely musculoskeletal in nature such as muscle spasm.  Recommend she continue to monitor and follow-up with primary care as needed.

## 2020-01-14 ENCOUNTER — Ambulatory Visit (HOSPITAL_COMMUNITY): Admission: RE | Admit: 2020-01-14 | Payer: Medicaid Other | Source: Ambulatory Visit

## 2020-01-29 ENCOUNTER — Other Ambulatory Visit: Payer: Self-pay

## 2020-01-29 ENCOUNTER — Ambulatory Visit (HOSPITAL_COMMUNITY)
Admission: RE | Admit: 2020-01-29 | Discharge: 2020-01-29 | Disposition: A | Discharge status: Home / Self Care | Payer: Medicaid Other | Source: Ambulatory Visit | Attending: Nurse Practitioner | Admitting: Nurse Practitioner

## 2020-01-29 DIAGNOSIS — R109 Unspecified abdominal pain: Secondary | ICD-10-CM | POA: Diagnosis present

## 2020-01-29 DIAGNOSIS — K219 Gastro-esophageal reflux disease without esophagitis: Secondary | ICD-10-CM | POA: Insufficient documentation

## 2020-01-29 DIAGNOSIS — R1310 Dysphagia, unspecified: Secondary | ICD-10-CM | POA: Insufficient documentation

## 2020-01-29 DIAGNOSIS — K6289 Other specified diseases of anus and rectum: Secondary | ICD-10-CM | POA: Diagnosis present

## 2020-02-03 ENCOUNTER — Encounter: Payer: Self-pay | Admitting: Physical Medicine and Rehabilitation

## 2020-02-03 NOTE — Procedures (Signed)
Lumbar Epidural Steroid Injection - Interlaminar Approach with Fluoroscopic Guidance  Patient: Ruth Shah      Date of Birth: July 25, 1976 MRN: [id number] PCP: Selinda Flavin, MD      Visit Date: 10/16/2019   Universal Protocol:     Consent Given By: the patient  Position: PRONE  Additional Comments: Vital signs were monitored before and after the procedure. Patient was prepped and draped in the usual sterile fashion. The correct patient, procedure, and site was verified.   Injection Procedure Details:  Procedure Site One Meds Administered:  Meds ordered this encounter  Medications  . methylPREDNISolone acetate (DEPO-MEDROL) injection 40 mg     Laterality: Right  Location/Site:  L5-S1  Needle size: 20 G  Needle type: Tuohy  Needle Placement: Paramedian epidural  Findings:   -Comments: Excellent flow of contrast into the epidural space.  Procedure Details: Using a paramedian approach from the side mentioned above, the region overlying the inferior lamina was localized under fluoroscopic visualization and the soft tissues overlying this structure were infiltrated with 4 ml. of 1% Lidocaine without Epinephrine. The Tuohy needle was inserted into the epidural space using a paramedian approach.   The epidural space was localized using loss of resistance along with lateral and bi-planar fluoroscopic views.  After negative aspirate for air, blood, and CSF, a 2 ml. volume of Isovue-250 was injected into the epidural space and the flow of contrast was observed. Radiographs were obtained for documentation purposes.    The injectate was administered into the level noted above.   Additional Comments:  The patient tolerated the procedure well Dressing: 2 x 2 sterile gauze and Band-Aid    Post-procedure details: Patient was observed during the procedure. Post-procedure instructions were reviewed.  Patient left the clinic in stable condition.

## 2020-02-03 NOTE — Progress Notes (Signed)
Ruth Shah - 44 y.o. female MRN [id number]  Date of birth: [birth year]  Office Visit Note: Visit Date: 10/16/2019 PCP: Selinda Flavin, MD Referred by: Selinda Flavin, MD  Subjective: Chief Complaint  Patient presents with  . Lower Back - Pain  . Right Leg - Pain, Numbness  . Right Foot - Numbness   HPI: Ruth Shah is a 44 y.o. female who comes in today For evaluation and management of low back pain with more right hip and leg pain with paresthesia.  She is followed by Dr. Annell Greening in our office from an orthopedic spine standpoint.  She has multiple medical issues ongoing and multiple drug intolerances.  She does have small protrusion at L5-S1 on prior MRI of the lumbar spine.  She has had 2 prior epidural injections with the first injection in 2018 giving her some decent relief.  The last injection 2019 was not as beneficial although it did help quite a bit just not as long.  She has not had any focal weakness or trauma.  She has not seen Dr. Ophelia Charter recently.  She rates her pain as a 5 out of 10 but does limit her daily activities.  She reports that standing and bending makes it really a lot worse.  She says really nothing is help with the pain.  It is more back pain than leg pain but she does get this paresthesia on the right.  Somewhat of an L5 distribution but also somewhat nondermatomal.  Case is complicated by history of fibromyalgia.  Review of Systems  Musculoskeletal: Positive for back pain and joint pain.  Neurological: Positive for tingling.  All other systems reviewed and are negative.  Otherwise per HPI.  Assessment & Plan: Visit Diagnoses:  1. Lumbar radiculopathy   2. Radiculopathy due to lumbar intervertebral disc disorder   3. Pain in right hip   4. Paresthesia of skin     Plan: Findings:  Chronic worsening severe at times but with average 5 out of 10 pain but debilitating at times of low back and right hip and leg with paresthesia.  Prior MRI with small  protrusion at L5-S1.  Continued follow-up needed with Dr. Annell Greening from an orthopedic standpoint but no red flag symptoms today.  Decided to repeat the injection 1 time and see how she does.  She may need updated MRI given the paresthesias are more prominent.  Continue with current medication and other treatment.  Unfortunately I do think the fibromyalgia is an underlying issue that does really cloud the picture of severity in terms of her spine and pain complaints. Standard treatment for fibromyalgia includes sleep management and hygiene as well has maintaining good exercise schedule along with neurotransmitter stabilizing medications including Lyrica and duloxetine and pain psychology treatment for cognitive behavioral therapy and coping strategies.     Meds & Orders:  Meds ordered this encounter  Medications  . methylPREDNISolone acetate (DEPO-MEDROL) injection 40 mg    Orders Placed This Encounter  Procedures  . XR C-ARM NO REPORT  . Epidural Steroid injection    Follow-up: Return for visit to requesting physician as needed.   Procedures: No procedures performed  Lumbar Epidural Steroid Injection - Interlaminar Approach with Fluoroscopic Guidance  Patient: Ruth Shah      Date of Birth: 08/19/1976 MRN: [id number] PCP: Selinda Flavin, MD      Visit Date: 10/16/2019   Universal Protocol:     Consent Given By: the patient  Position: PRONE  Additional Comments: Vital signs were monitored before and after the procedure. Patient was prepped and draped in the usual sterile fashion. The correct patient, procedure, and site was verified.   Injection Procedure Details:  Procedure Site One Meds Administered:  Meds ordered this encounter  Medications  . methylPREDNISolone acetate (DEPO-MEDROL) injection 40 mg     Laterality: Right  Location/Site:  L5-S1  Needle size: 20 G  Needle type: Tuohy  Needle Placement: Paramedian epidural  Findings:   -Comments:  Excellent flow of contrast into the epidural space.  Procedure Details: Using a paramedian approach from the side mentioned above, the region overlying the inferior lamina was localized under fluoroscopic visualization and the soft tissues overlying this structure were infiltrated with 4 ml. of 1% Lidocaine without Epinephrine. The Tuohy needle was inserted into the epidural space using a paramedian approach.   The epidural space was localized using loss of resistance along with lateral and bi-planar fluoroscopic views.  After negative aspirate for air, blood, and CSF, a 2 ml. volume of Isovue-250 was injected into the epidural space and the flow of contrast was observed. Radiographs were obtained for documentation purposes.    The injectate was administered into the level noted above.   Additional Comments:  The patient tolerated the procedure well Dressing: 2 x 2 sterile gauze and Band-Aid    Post-procedure details: Patient was observed during the procedure. Post-procedure instructions were reviewed.  Patient left the clinic in stable condition.    Clinical History: MRI LUMBAR SPINE WITHOUT CONTRAST  TECHNIQUE: Multiplanar, multisequence MR imaging of the lumbar spine was performed. No intravenous contrast was administered.  COMPARISON:  Previous MRI from 02/09/2015.  FINDINGS: Segmentation: Normal segmentation. Lowest well-formed disc is labeled the L5-S1 level.  Alignment: Vertebral bodies normally aligned with preservation of the normal lumbar lordosis. No listhesis.  Vertebrae: Vertebral body heights are well maintained. No evidence for acute or chronic fracture. Signal intensity within the vertebral body bone marrow is within normal limits. No discrete or worrisome osseous lesions. No abnormal marrow edema.  Conus medullaris: Extends to the T12 level and appears normal.  Paraspinal and other soft tissues: Paraspinous soft tissues within normal limits.  Visualized visceral structures are normal.  Disc levels:  No significant degenerative changes are seen through the L3-4 level.  L4-5: Normal interspace. Mild facet hypertrophy. No canal or foraminal stenosis.  L5-S1: Shallow central disc protrusion, a eccentric to the right. Protruding disc contacts the descending right S1 nerve root without frank impingement. No canal stenosis. Foramina are widely patent.  IMPRESSION: 1. Shallow right eccentric disc protrusion at L5-S1, contacting and potentially irritating the descending right S1 nerve root, similar to previous. 2. Mild facet hypertrophy at L4-5. 3. Otherwise unremarkable MRI of the lumbar spine. No significant stenosis or evidence for impingement.   Electronically Signed   By: Jeannine Boga M.D.   On: 04/03/2017 08:05   She reports that she has never smoked. She has never used smokeless tobacco. No results for input(s): HGBA1C, LABURIC in the last 8760 hours.  Objective:  VS:  HT:    WT:   BMI:     BP:135/87  HR:77bpm  TEMP: ( )  RESP:  Physical Exam Constitutional:      General: She is not in acute distress.    Appearance: Normal appearance. She is not ill-appearing.  HENT:     Head: Normocephalic and atraumatic.     Right Ear: External ear normal.     Left Ear: External  ear normal.  Eyes:     Extraocular Movements: Extraocular movements intact.  Cardiovascular:     Rate and Rhythm: Normal rate.     Pulses: Normal pulses.  Musculoskeletal:     Right lower leg: No edema.     Left lower leg: No edema.     Comments: Patient has good distal strength with no pain over the greater trochanters.  There are tender points across the lower back and PSIS and mildly over the greater trochanters. no clonus or focal weakness.  No pain with hip rotation.  Skin:    Findings: No erythema, lesion or rash.  Neurological:     General: No focal deficit present.     Mental Status: She is alert and oriented to  person, place, and time.     Sensory: No sensory deficit.     Motor: No weakness or abnormal muscle tone.     Coordination: Coordination normal.  Psychiatric:        Mood and Affect: Mood normal.        Behavior: Behavior normal.     Ortho Exam  Imaging: No results found.  Past Medical/Family/Surgical/Social History: Medications & Allergies reviewed per EMR, new medications updated. Patient Active Problem List   Diagnosis Date Noted  . Radiculopathy due to lumbar intervertebral disc disorder 10/16/2019  . Rectal leakage 06/17/2019  . Abdominal pain 09/03/2018  . Rectal pain 09/03/2018  . Mild intermittent asthma, uncomplicated 04/24/2018  . Adverse food reaction 04/24/2018  . Perennial allergic rhinitis 04/24/2018  . Allergic urticaria 04/24/2018  . Gastroesophageal reflux disease   . Hiatal hernia   . Hematometra 10/02/2017  . Pelvic pain in female 10/02/2017  . Status post vaginal hysterectomy 10/02/2017  . Rectal bleeding 07/26/2017  . Rectocele, female 06/28/2017  . Uterine prolapse 06/28/2017  . Diarrhea 04/26/2017  . Chronic bilateral low back pain without sciatica 02/15/2017  . Dyspepsia 12/21/2016  . Dysphagia 12/21/2016  . Nausea without vomiting 12/21/2016  . Chronic migraine 07/20/2016  . Neck pain 07/20/2016  . 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 H/O Genital warts 08/12/2015   Past Medical History:  Diagnosis Date  . ADD (attention deficit disorder)   . Arthritis   . Asthma    mild seasonal reactions to mold and mildew  . Back pain   . Depression   . Fibromyalgia   . GERD (gastroesophageal reflux disease)   . Herpes simplex disease   . History of kidney stones   . Migraines   . Paresthesia of both hands   . PONV (postoperative nausea and vomiting)   . Urticaria    Family History  Problem Relation Age of Onset  . Cancer Mother        skin  . Depression Mother   . Hypertension Mother   . Hyperlipidemia Mother   . Diabetes Mother   . Cancer  Father        skin  . Arthritis Father   . Hypertension Father   . Hyperlipidemia Father   . Asthma Brother   . Depression Maternal Grandmother   . Hypertension Maternal Grandmother   . Mental illness Maternal Grandmother   . Cancer Maternal Grandfather        kidney  . Hypertension Maternal Grandfather   . Kidney disease Maternal Grandfather   . Emphysema Paternal Grandmother   . Alzheimer's disease Paternal Grandmother   . Parkinson's disease Paternal Grandfather   . Colon cancer Neg Hx   . Colon polyps Neg  Hx   . Allergic rhinitis Neg Hx   . Eczema Neg Hx   . Urticaria Neg Hx    Past Surgical History:  Procedure Laterality Date  . BIOPSY  09/23/2019   Procedure: BIOPSY;  Surgeon: West Bali, MD;  Location: [facility];  Service: Endoscopy;;  esophagus  . ENDOMETRIAL ABLATION    . ESOPHAGEAL MANOMETRY N/A 10/15/2017   Procedure: ESOPHAGEAL MANOMETRY (EM);  Surgeon: Napoleon Form, MD;  Location: [facility];  Service: Endoscopy;  Laterality: N/A;  . ESOPHAGOGASTRODUODENOSCOPY (EGD) WITH PROPOFOL N/A 12/26/2016   Dr. Darrick Penna: one benign-appearing, intrinsic stenosis that was widely patent and traversed. Small hiatal hernia, few small sessile polyps. Chronic gastritis. Normal duodenum, negative celiac sprue.   . ESOPHAGOGASTRODUODENOSCOPY (EGD) WITH PROPOFOL N/A 09/23/2019   Procedure: ESOPHAGOGASTRODUODENOSCOPY (EGD) WITH PROPOFOL;  Surgeon: West Bali, MD;  Location: [facility];  Service: Endoscopy;  Laterality: N/A;  11:00am  . HERNIA REPAIR     inguinal as a baby   . left ovarian removal due to cyst    . PILONIDAL CYST / SINUS EXCISION  2009  . RECTOCELE REPAIR N/A 10/02/2017   Procedure: POSTERIOR REPAIR (RECTOCELE);  Surgeon: Tilda Burrow, MD;  Location: [facility];  Service: Gynecology;  Laterality: N/A;  . SAVORY DILATION N/A 09/23/2019   Procedure: SAVORY DILATION;  Surgeon: West Bali, MD;  Location: [facility];  Service: Endoscopy;   Laterality: N/A;  . SINOSCOPY    . TUBAL LIGATION    . VAGINAL HYSTERECTOMY N/A 10/02/2017   Procedure: HYSTERECTOMY VAGINAL;  Surgeon: Tilda Burrow, MD;  Location: [facility];  Service: Gynecology;  Laterality: N/A;   Social History   Occupational History  . Not on file  Tobacco Use  . Smoking status: Never Smoker  . Smokeless tobacco: Never Used  Substance and Sexual Activity  . Alcohol use: No    Alcohol/week: 0.0 standard drinks  . Drug use: No  . Sexual activity: Yes    Birth control/protection: Surgical    Comment: hyst

## 2020-02-04 ENCOUNTER — Other Ambulatory Visit: Payer: Self-pay

## 2020-02-04 ENCOUNTER — Encounter (HOSPITAL_COMMUNITY): Payer: Self-pay | Admitting: Emergency Medicine

## 2020-02-04 DIAGNOSIS — R1011 Right upper quadrant pain: Secondary | ICD-10-CM | POA: Diagnosis not present

## 2020-02-04 DIAGNOSIS — J45909 Unspecified asthma, uncomplicated: Secondary | ICD-10-CM | POA: Diagnosis not present

## 2020-02-04 DIAGNOSIS — Z79899 Other long term (current) drug therapy: Secondary | ICD-10-CM | POA: Insufficient documentation

## 2020-02-04 DIAGNOSIS — R1031 Right lower quadrant pain: Secondary | ICD-10-CM | POA: Diagnosis not present

## 2020-02-04 LAB — CBC
HCT: 43.6 % (ref 36.0–46.0)
Hemoglobin: 13.7 g/dL (ref 12.0–15.0)
MCH: 29.5 pg (ref 26.0–34.0)
MCHC: 31.4 g/dL (ref 30.0–36.0)
MCV: 93.8 fL (ref 80.0–100.0)
Platelets: 321 10*3/uL (ref 150–400)
RBC: 4.65 MIL/uL (ref 3.87–5.11)
RDW: 12.6 % (ref 11.5–15.5)
WBC: 10.8 10*3/uL — ABNORMAL HIGH (ref 4.0–10.5)
nRBC: 0 % (ref 0.0–0.2)

## 2020-02-04 LAB — COMPREHENSIVE METABOLIC PANEL
ALT: 23 U/L (ref 0–44)
AST: 13 U/L — ABNORMAL LOW (ref 15–41)
Albumin: 3.8 g/dL (ref 3.5–5.0)
Alkaline Phosphatase: 65 U/L (ref 38–126)
Anion gap: 7 (ref 5–15)
BUN: 17 mg/dL (ref 6–20)
CO2: 27 mmol/L (ref 22–32)
Calcium: 8.9 mg/dL (ref 8.9–10.3)
Chloride: 105 mmol/L (ref 98–111)
Creatinine, Ser: 0.84 mg/dL (ref 0.44–1.00)
GFR calc Af Amer: >60 mL/min (ref >60)
GFR calc non Af Amer: >60 mL/min (ref >60)
Glucose, Bld: 99 mg/dL (ref 70–99)
Potassium: 3.7 mmol/L (ref 3.5–5.1)
Sodium: 139 mmol/L (ref 135–145)
Total Bilirubin: 0.6 mg/dL (ref 0.3–1.2)
Total Protein: 7 g/dL (ref 6.5–8.1)

## 2020-02-04 LAB — LIPASE, BLOOD: Lipase: 34 U/L (ref 11–51)

## 2020-02-04 NOTE — ED Triage Notes (Signed)
Patient states right flank pain that has been ongoing x 1 year pain is worse tonight. Patient states nausea.

## 2020-02-05 ENCOUNTER — Emergency Department (HOSPITAL_COMMUNITY)
Admission: EM | Admit: 2020-02-05 | Discharge: 2020-02-05 | Disposition: A | Discharge status: Home / Self Care | Payer: Medicaid Other | Attending: Emergency Medicine | Admitting: Emergency Medicine

## 2020-02-05 ENCOUNTER — Telehealth: Payer: Self-pay | Admitting: Gastroenterology

## 2020-02-05 DIAGNOSIS — R1011 Right upper quadrant pain: Secondary | ICD-10-CM

## 2020-02-05 DIAGNOSIS — R109 Unspecified abdominal pain: Secondary | ICD-10-CM

## 2020-02-05 NOTE — Addendum Note (Signed)
Addended by: Corrie Mckusick on: 02/05/2020 03:27 PM   Modules accepted: Orders

## 2020-02-05 NOTE — Telephone Encounter (Signed)
Pt returned call. Pt was seen at the ED this morning. Rt side pain directly under pts ribcage. This has been present for 1- 1 1/2 year. Pain comes and goes and can get to a 7 with 10 being the highest. Pain started last night and lasted for a few hours. Pts pain is at a 1 now. Pt notices the pain more if she has diary, fatty/greasy foods. Per ED note, pt needs to be seen ASAP. There are currently no immediate openings on any providers schedule. Please advise.

## 2020-02-05 NOTE — Telephone Encounter (Signed)
Referral sent to surgery via Epic. °

## 2020-02-05 NOTE — Telephone Encounter (Signed)
Lmom, waiting on a return call.  

## 2020-02-05 NOTE — Telephone Encounter (Signed)
Added patient to a cancellation list

## 2020-02-05 NOTE — Telephone Encounter (Signed)
Pt returned call. Pt is aware that she is on the cancellation list. Pt is in agreement with referral to surgeon for possible removal of gallbladder. Pt is also in agreement with HIDA scan. On Mondays, Wednesdays, Fridays pt can go late afternoons and any times on Tuesday and Thursday.

## 2020-02-05 NOTE — Telephone Encounter (Signed)
Noted. Please place on cancellation list. Previous HIDA some times ago which was low/normal. I will put in for another HIDA scan. Can we also go ahead and get a referral to surgeon of patient's choice (if she's agreeable) to discuss possible cholecystectomy.

## 2020-02-05 NOTE — Telephone Encounter (Signed)
[phone]  Please call patient, she was in the er and was told to call here because she needed a hida scan?  I told her I would have the scheduler call her, did not know if we could do that

## 2020-02-05 NOTE — Addendum Note (Signed)
Addended by: Delane Ginger, Michele Judy A on: 02/05/2020 01:30 PM   Modules accepted: Orders

## 2020-02-05 NOTE — Telephone Encounter (Signed)
Fowarding to RMR nurse as we don't just schedule HIDA's w/o orders from our providers 1st

## 2020-02-05 NOTE — ED Provider Notes (Signed)
[facility] EMERGENCY DEPARTMENT Provider Note   CSN: [id number] Arrival date & time: 02/04/20  2208     History Chief Complaint  Patient presents with  . Flank Pain    right    Ruth Shah is a 44 y.o. female.  Longstanding history of right upper quadrant/right flank pain sometimes in the right side of her back.  She had multiple studies done in the past look into it.  Usually made worse with certain foods but not always with those foods.  Tonight she had [place] food which caused her to have severe pain.  It is improved significantly at this point.   Flank Pain This is a chronic problem. The current episode started 3 to 5 hours ago. The problem occurs constantly. The problem has been gradually improving. Associated symptoms include abdominal pain (ruq). Exacerbated by: eating fatty food. She has tried nothing for the symptoms.       Past Medical History:  Diagnosis Date  . ADD (attention deficit disorder)   . Arthritis   . Asthma    mild seasonal reactions to mold and mildew  . Back pain   . Depression   . Fibromyalgia   . GERD (gastroesophageal reflux disease)   . Herpes simplex disease   . History of kidney stones   . Migraines   . Paresthesia of both hands   . PONV (postoperative nausea and vomiting)   . Urticaria     Patient Active Problem List   Diagnosis Date Noted  . Radiculopathy due to lumbar intervertebral disc disorder 10/16/2019  . Rectal leakage 06/17/2019  . Abdominal pain 09/03/2018  . Rectal pain 09/03/2018  . Mild intermittent asthma, uncomplicated 04/24/2018  . Adverse food reaction 04/24/2018  . Perennial allergic rhinitis 04/24/2018  . Allergic urticaria 04/24/2018  . Gastroesophageal reflux disease   . Hiatal hernia   . Hematometra 10/02/2017  . Pelvic pain in female 10/02/2017  . Status post vaginal hysterectomy 10/02/2017  . Rectal bleeding 07/26/2017  . Rectocele, female 06/28/2017  . Uterine prolapse 06/28/2017  . Diarrhea  04/26/2017  . Chronic bilateral low back pain without sciatica 02/15/2017  . Dyspepsia 12/21/2016  . Dysphagia 12/21/2016  . Nausea without vomiting 12/21/2016  . Chronic migraine 07/20/2016  . Neck pain 07/20/2016  . 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 H/O Genital warts 08/12/2015    Past Surgical History:  Procedure Laterality Date  . BIOPSY  09/23/2019   Procedure: BIOPSY;  Surgeon: West Bali, MD;  [facility]: [facility];  Service: Endoscopy;;  esophagus  . ENDOMETRIAL ABLATION    . ESOPHAGEAL MANOMETRY N/A 10/15/2017   Procedure: ESOPHAGEAL MANOMETRY (EM);  Surgeon: Napoleon Form, MD;  Location: [facility];  Service: Endoscopy;  Laterality: N/A;  . ESOPHAGOGASTRODUODENOSCOPY (EGD) WITH PROPOFOL N/A 12/26/2016   Dr. Darrick Penna: one benign-appearing, intrinsic stenosis that was widely patent and traversed. Small hiatal hernia, few small sessile polyps. Chronic gastritis. Normal duodenum, negative celiac sprue.   . ESOPHAGOGASTRODUODENOSCOPY (EGD) WITH PROPOFOL N/A 09/23/2019   Procedure: ESOPHAGOGASTRODUODENOSCOPY (EGD) WITH PROPOFOL;  Surgeon: West Bali, MD;  Location: [facility];  Service: Endoscopy;  Laterality: N/A;  11:00am  . HERNIA REPAIR     inguinal as a baby   . left ovarian removal due to cyst    . PILONIDAL CYST / SINUS EXCISION  2009  . RECTOCELE REPAIR N/A 10/02/2017   Procedure: POSTERIOR REPAIR (RECTOCELE);  Surgeon: Tilda Burrow, MD;  Location: [facility];  Service: Gynecology;  Laterality: N/A;  . SAVORY DILATION N/A 09/23/2019   Procedure: SAVORY DILATION;  Surgeon: Danie Binder, MD;  Location: [facility];  Service: Endoscopy;  Laterality: N/A;  . SINOSCOPY    . TUBAL LIGATION    . VAGINAL HYSTERECTOMY N/A 10/02/2017   Procedure: HYSTERECTOMY VAGINAL;  Surgeon: Jonnie Kind, MD;  Location: [facility];  Service: Gynecology;  Laterality: N/A;     OB History    Gravida  5   Para  4   Term  2   Preterm  2   AB  1   Living  4     SAB  1     TAB      Ectopic      Multiple  1   Live Births  4           Family History  Problem Relation Age of Onset  . Cancer Mother        skin  . Depression Mother   . Hypertension Mother   . Hyperlipidemia Mother   . Diabetes Mother   . Cancer Father        skin  . Arthritis Father   . Hypertension Father   . Hyperlipidemia Father   . Asthma Brother   . Depression Maternal Grandmother   . Hypertension Maternal Grandmother   . Mental illness Maternal Grandmother   . Cancer Maternal Grandfather        kidney  . Hypertension Maternal Grandfather   . Kidney disease Maternal Grandfather   . Emphysema Paternal Grandmother   . Alzheimer's disease Paternal Grandmother   . Parkinson's disease Paternal Grandfather   . Colon cancer Neg Hx   . Colon polyps Neg Hx   . Allergic rhinitis Neg Hx   . Eczema Neg Hx   . Urticaria Neg Hx     Social History   Tobacco Use  . Smoking status: Never Smoker  . Smokeless tobacco: Never Used  Substance Use Topics  . Alcohol use: No    Alcohol/week: 0.0 standard drinks  . Drug use: No    Home Medications Prior to Admission medications   Medication Sig Start Date End Date Taking? Authorizing Provider  FLUoxetine (PROZAC) 20 MG capsule Take 20 mg by mouth daily. 11/07/18   [provider]  methylphenidate 18 MG PO CR tablet Take 18 mg by mouth daily.    [provider]  pantoprazole (PROTONIX) 40 MG tablet Take 1 tablet (40 mg total) by mouth 2 (two) times daily before a meal. 01/06/20   Carlis Stable, NP  propranolol ER (INDERAL LA) 80 MG 24 hr capsule Take 80 mg by mouth at bedtime.  01/13/19   [provider]  SUMAtriptan (IMITREX) 50 MG tablet Take 50 mg by mouth every 2 (two) hours as needed for migraine.     [provider]    Allergies    Beef-derived products, Dilaudid [hydromorphone hcl], Duloxetine, Other, Sulfa antibiotics, Toradol [ketorolac tromethamine], and Vicodin  [hydrocodone-acetaminophen]  Review of Systems   Review of Systems  Gastrointestinal: Positive for abdominal pain (ruq).  Genitourinary: Positive for flank pain.  All other systems reviewed and are negative.   Physical Exam Updated Vital Signs BP (!) 157/101 (BP Location: Right Arm)   Pulse 80   Temp 98.2 F (36.8 C) (Oral)   Resp 18   Ht 5\' 3"  (1.6 m)   Wt 83.9 kg   SpO2 100%   BMI 32.77 kg/m  Physical Exam Vitals and nursing note reviewed.  Constitutional:      Appearance: She is well-developed.  HENT:     Head: Normocephalic and atraumatic.     Nose: No congestion or rhinorrhea.     Mouth/Throat:     Mouth: Mucous membranes are dry.     Pharynx: Oropharynx is clear.  Eyes:     Conjunctiva/sclera: Conjunctivae normal.  Cardiovascular:     Rate and Rhythm: Normal rate and regular rhythm.     Heart sounds: No murmur.  Pulmonary:     Effort: No respiratory distress.     Breath sounds: No stridor.  Abdominal:     General: There is no distension.     Tenderness: There is abdominal tenderness (mild ruq, neg murphy).  Musculoskeletal:        General: No swelling or tenderness. Normal range of motion.     Cervical back: Normal range of motion.  Skin:    General: Skin is warm and dry.  Neurological:     General: No focal deficit present.     Mental Status: She is alert.     ED Results / Procedures / Treatments   Labs (all labs ordered are listed, but only abnormal results are displayed) Labs Reviewed  COMPREHENSIVE METABOLIC PANEL - Abnormal; Notable for the following components:      Result Value   AST 13 (*)    All other components within normal limits  CBC - Abnormal; Notable for the following components:   WBC 10.8 (*)    All other components within normal limits  LIPASE, BLOOD    EKG None  Radiology No results found.  Procedures Procedures (including critical care time)  Medications Ordered in ED Medications - No data to display  ED  Course  I have reviewed the triage vital signs and the nursing notes.  Pertinent labs & imaging results that were available during my care of the patient were reviewed by me and considered in my medical decision making (see chart for details).    MDM Rules/Calculators/A&P                      Here with what sounds like biliary colic.  Her symptoms are significantly improved at this time.  Her LFTs are normal.  She she had an ultrasound in the last couple years that was normal but she had HIDA scan in 2018 that showed a ejection of 40%.  Low suspicion for acute cholecystitis or any other emergent cause for symptoms at this time.  She probably needs another HIDA scan.  Discussed this with her keeping with a low-fat diet for now though she can follow-up with her gastroenterologist.  Message sent to her gastroenterologist APP in the epic to try to schedule sooner follow-up.  She knows return here for persistent or worsening symptoms. Final Clinical Impression(s) / ED Diagnoses Final diagnoses:  Flank pain    Rx / DC Orders ED Discharge Orders    None       Jeriel Vivanco, Barbara Cower, MD 02/05/20 0101

## 2020-02-06 NOTE — Telephone Encounter (Signed)
HIDA scheduled for 02/10/20 at 8:00am, arrive at 7:45am. NPO after midnight prior to to test and no pain medication.  Called and informed pt of appt.

## 2020-02-10 ENCOUNTER — Encounter (HOSPITAL_COMMUNITY): Payer: Self-pay

## 2020-02-10 ENCOUNTER — Encounter (HOSPITAL_COMMUNITY)
Admission: RE | Admit: 2020-02-10 | Discharge: 2020-02-10 | Disposition: A | Discharge status: Home / Self Care | Payer: Medicaid Other | Source: Ambulatory Visit | Attending: Nurse Practitioner | Admitting: Nurse Practitioner

## 2020-02-10 ENCOUNTER — Other Ambulatory Visit: Payer: Self-pay

## 2020-02-10 DIAGNOSIS — R1011 Right upper quadrant pain: Secondary | ICD-10-CM | POA: Diagnosis present

## 2020-02-10 HISTORY — DX: Essential (primary) hypertension: I10

## 2020-02-10 MED ORDER — TECHNETIUM TC 99M MEBROFENIN IV KIT
5.0000 | PACK | Freq: Once | INTRAVENOUS | Status: AC | PRN
  Administered 2020-02-10: 5.3 via INTRAVENOUS

## 2020-02-17 ENCOUNTER — Other Ambulatory Visit: Payer: Self-pay

## 2020-02-17 ENCOUNTER — Encounter: Payer: Self-pay | Admitting: General Surgery

## 2020-02-17 ENCOUNTER — Ambulatory Visit (INDEPENDENT_AMBULATORY_CARE_PROVIDER_SITE_OTHER): Payer: Medicaid Other | Admitting: General Surgery

## 2020-02-17 VITALS — BP 138/83 | HR 86 | Temp 96.6°F | Resp 15 | Ht 63.0 in | Wt 191.0 lb

## 2020-02-17 DIAGNOSIS — K8050 Calculus of bile duct without cholangitis or cholecystitis without obstruction: Secondary | ICD-10-CM

## 2020-02-17 NOTE — Patient Instructions (Signed)
Laparoscopic Cholecystectomy Laparoscopic cholecystectomy is surgery to remove the gallbladder. The gallbladder is a pear-shaped organ that lies beneath the liver on the right side of the body. The gallbladder stores bile, which is a fluid that helps the body to digest fats. Cholecystectomy is often done for inflammation of the gallbladder (cholecystitis). This condition is usually caused by a buildup of gallstones (cholelithiasis) in the gallbladder. Gallstones can block the flow of bile, which can result in inflammation and pain. In severe cases, emergency surgery may be required. This procedure is done though small incisions in your abdomen (laparoscopic surgery). A thin scope with a camera (laparoscope) is inserted through one incision. Thin surgical instruments are inserted through the other incisions. In some cases, a laparoscopic procedure may be turned into a type of surgery that is done through a larger incision (open surgery). Tell a health care provider about:  Any allergies you have.  All medicines you are taking, including vitamins, herbs, eye drops, creams, and over-the-counter medicines.  Any problems you or family members have had with anesthetic medicines.  Any blood disorders you have.  Any surgeries you have had.  Any medical conditions you have.  Whether you are pregnant or may be pregnant. What are the risks? Generally, this is a safe procedure. However, problems may occur, including:  Infection.  Bleeding.  Allergic reactions to medicines.  Damage to other structures or organs.  A stone remaining in the common bile duct. The common bile duct carries bile from the gallbladder into the small intestine.  A bile leak from the cyst duct that is clipped when your gallbladder is removed. What happens before the procedure?   Medicines  Ask your health care provider about: ? Changing or stopping your regular medicines. This is especially important if you are taking  diabetes medicines or blood thinners. ? Taking medicines such as aspirin and ibuprofen. These medicines can thin your blood. Do not take these medicines before your procedure if your health care provider instructs you not to.  You may be given antibiotic medicine to help prevent infection. General instructions  Let your health care provider know if you develop a cold or an infection before surgery.  Plan to have someone take you home from the hospital or clinic.  Ask your health care provider how your surgical site will be marked or identified. What happens during the procedure?   To reduce your risk of infection: ? Your health care team will wash or sanitize their hands. ? Your skin will be washed with soap. ? Hair may be removed from the surgical area.  An IV tube may be inserted into one of your veins.  You will be given one or more of the following: ? A medicine to help you relax (sedative). ? A medicine to make you fall asleep (general anesthetic).  A breathing tube will be placed in your mouth.  Your surgeon will make several small cuts (incisions) in your abdomen.  The laparoscope will be inserted through one of the small incisions. The camera on the laparoscope will send images to a TV screen (monitor) in the operating room. This lets your surgeon see inside your abdomen.  Air-like gas will be pumped into your abdomen. This will expand your abdomen to give the surgeon more room to perform the surgery.  Other tools that are needed for the procedure will be inserted through the other incisions. The gallbladder will be removed through one of the incisions.  Your common bile duct   may be examined. If stones are found in the common bile duct, they may be removed.  After your gallbladder has been removed, the incisions will be closed with stitches (sutures), staples, or skin glue.  Your incisions may be covered with a bandage (dressing). The procedure may vary among health  care providers and hospitals. What happens after the procedure?  Your blood pressure, heart rate, breathing rate, and blood oxygen level will be monitored until the medicines you were given have worn off.  You will be given medicines as needed to control your pain.  Do not drive for 24 hours if you were given a sedative. This information is not intended to replace advice given to you by your health care provider. Make sure you discuss any questions you have with your health care provider. Document Revised: 08/24/2017 Document Reviewed: 02/28/2016 Elsevier Patient Education  2020 [employer]  

## 2020-02-19 NOTE — Progress Notes (Signed)
Ruth Shah; [id number]; May 19, 1976   HPI Patient is a 44 year old white female who was referred to my care by Selinda Flavin and Wynne Dust for evaluation treatment of persistent right upper quadrant abdominal pain.  She has had this present intermittently for the past 3 years, but recently it has increased in frequency and intensity.  It seems to occur after fatty meals.  She denies any fever, chills, or jaundice.  Ultrasound the gallbladder is negative.  Hepatobiliary scan shows a normal ejection fraction but she did have reproducible symptoms with the fatty meal.  The pain seems to radiate to the right flank.  She currently has 0 out of 10 abdominal pain.  She does watch what she eats.  She has had an extensive work-up which has been unremarkable for the abdominal pain. Past Medical History:  Diagnosis Date  . ADD (attention deficit disorder)   . Arthritis   . Asthma    mild seasonal reactions to mold and mildew  . Back pain   . Depression   . Fibromyalgia   . GERD (gastroesophageal reflux disease)   . Herpes simplex disease   . History of kidney stones   . Hypertension   . Migraines   . Paresthesia of both hands   . PONV (postoperative nausea and vomiting)   . Urticaria     Past Surgical History:  Procedure Laterality Date  . BIOPSY  09/23/2019   Procedure: BIOPSY;  Surgeon: West Bali, MD;  Location: [facility];  Service: Endoscopy;;  esophagus  . ENDOMETRIAL ABLATION    . ESOPHAGEAL MANOMETRY N/A 10/15/2017   Procedure: ESOPHAGEAL MANOMETRY (EM);  Surgeon: Napoleon Form, MD;  Location: [facility];  Service: Endoscopy;  Laterality: N/A;  . ESOPHAGOGASTRODUODENOSCOPY (EGD) WITH PROPOFOL N/A 12/26/2016   Dr. Darrick Penna: one benign-appearing, intrinsic stenosis that was widely patent and traversed. Small hiatal hernia, few small sessile polyps. Chronic gastritis. Normal duodenum, negative celiac sprue.   . ESOPHAGOGASTRODUODENOSCOPY (EGD) WITH PROPOFOL N/A 09/23/2019    Procedure: ESOPHAGOGASTRODUODENOSCOPY (EGD) WITH PROPOFOL;  Surgeon: West Bali, MD;  Location: [facility];  Service: Endoscopy;  Laterality: N/A;  11:00am  . HERNIA REPAIR     inguinal as a baby   . left ovarian removal due to cyst    . PILONIDAL CYST / SINUS EXCISION  2009  . RECTOCELE REPAIR N/A 10/02/2017   Procedure: POSTERIOR REPAIR (RECTOCELE);  Surgeon: Tilda Burrow, MD;  Location: [facility];  Service: Gynecology;  Laterality: N/A;  . SAVORY DILATION N/A 09/23/2019   Procedure: SAVORY DILATION;  Surgeon: West Bali, MD;  Location: [facility];  Service: Endoscopy;  Laterality: N/A;  . SINOSCOPY    . TUBAL LIGATION    . VAGINAL HYSTERECTOMY N/A 10/02/2017   Procedure: HYSTERECTOMY VAGINAL;  Surgeon: Tilda Burrow, MD;  Location: [facility];  Service: Gynecology;  Laterality: N/A;    Family History  Problem Relation Age of Onset  . Cancer Mother        skin  . Depression Mother   . Hypertension Mother   . Hyperlipidemia Mother   . Diabetes Mother   . Cancer Father        skin  . Arthritis Father   . Hypertension Father   . Hyperlipidemia Father   . Asthma Brother   . Depression Maternal Grandmother   . Hypertension Maternal Grandmother   . Mental illness Maternal Grandmother   . Cancer Maternal Grandfather  kidney  . Hypertension Maternal Grandfather   . Kidney disease Maternal Grandfather   . Emphysema Paternal Grandmother   . Alzheimer's disease Paternal Grandmother   . Parkinson's disease Paternal Grandfather   . Colon cancer Neg Hx   . Colon polyps Neg Hx   . Allergic rhinitis Neg Hx   . Eczema Neg Hx   . Urticaria Neg Hx     Current Outpatient Medications on File Prior to Visit  Medication Sig Dispense Refill  . FLUoxetine (PROZAC) 20 MG capsule Take 20 mg by mouth daily.    . methylphenidate 18 MG PO CR tablet Take 18 mg by mouth daily.    . pantoprazole (PROTONIX) 40 MG tablet Take 1 tablet (40 mg total) by mouth 2 (two) times daily  before a meal. 180 tablet 1  . propranolol ER (INDERAL LA) 80 MG 24 hr capsule Take 80 mg by mouth at bedtime.     . SUMAtriptan (IMITREX) 50 MG tablet Take 50 mg by mouth every 2 (two) hours as needed for migraine.      Current Facility-Administered Medications on File Prior to Visit  Medication Dose Route Frequency Provider Last Rate Last Admin  . methylPREDNISolone acetate (DEPO-MEDROL) injection 40 mg  40 mg Other Once Tyrell Antonio, MD        Allergies  Allergen Reactions  . Beef-Derived Products     Tick bite-Alpha  - RESOLVED  . Dilaudid [Hydromorphone Hcl] Nausea And Vomiting  . Duloxetine Other (See Comments)    Felt like she was floating and "didn't feel right" on it  . Other Hives    Alpha Gal cause break out hives  . Sulfa Antibiotics Nausea Only  . Toradol [Ketorolac Tromethamine] Nausea Only  . Vicodin [Hydrocodone-Acetaminophen] Itching    Social History   Substance and Sexual Activity  Alcohol Use No  . Alcohol/week: 0.0 standard drinks    Social History   Tobacco Use  Smoking Status Never Smoker  Smokeless Tobacco Never Used    Review of Systems  Constitutional: Positive for malaise/fatigue.  HENT: Negative.   Eyes: Positive for pain.  Respiratory: Positive for shortness of breath.   Cardiovascular: Negative.   Gastrointestinal: Positive for abdominal pain, heartburn, nausea and vomiting.  Genitourinary: Negative.   Musculoskeletal: Positive for back pain, joint pain and neck pain.  Skin: Negative.   Neurological: Negative.   Endo/Heme/Allergies: Negative.   Psychiatric/Behavioral: Negative.     Objective   Vitals:   02/17/20 1310  BP: 138/83  Pulse: 86  Resp: 15  Temp: (!) 96.6 F (35.9 C)  SpO2: 96%    Physical Exam Vitals reviewed.  Constitutional:      Appearance: Normal appearance. She is not ill-appearing.  HENT:     Head: Normocephalic and atraumatic.  Eyes:     General: No scleral icterus. Cardiovascular:     Rate  and Rhythm: Normal rate and regular rhythm.     Heart sounds: Normal heart sounds. No murmur. No friction rub. No gallop.   Pulmonary:     Effort: Pulmonary effort is normal. No respiratory distress.     Breath sounds: Normal breath sounds. No stridor. No wheezing, rhonchi or rales.  Abdominal:     General: Bowel sounds are normal. There is no distension.     Palpations: Abdomen is soft. There is no mass.     Tenderness: There is no abdominal tenderness. There is no guarding or rebound.     Hernia: No hernia is present.  Skin:    General: Skin is warm and dry.  Neurological:     Mental Status: She is alert and oriented to person, place, and time.    GI notes reviewed Assessment  Abdominal pain consistent with biliary colic.  She has other issues that may be contributing to her biliary colic.  She understands that by taking out her gallbladder, this will limit eliminate any further issues concerning her gallbladder. Plan   Patient is scheduled for laparoscopic cholecystectomy on 04/09/2020.  The risks and benefits of the procedure including bleeding, infection, hepatobiliary injury, recurrence of her symptoms, and the possibility of an open procedure were fully explained to the patient, who gave informed consent.

## 2020-03-15 NOTE — H&P (Signed)
Ruth Shah; [id number]; 08/02/1976   HPI Patient is a 43-year-old white female who was referred to my care by Kevin Howard and Eric Gill for evaluation treatment of persistent right upper quadrant abdominal pain.  She has had this present intermittently for the past 3 years, but recently it has increased in frequency and intensity.  It seems to occur after fatty meals.  She denies any fever, chills, or jaundice.  Ultrasound the gallbladder is negative.  Hepatobiliary scan shows a normal ejection fraction but she did have reproducible symptoms with the fatty meal.  The pain seems to radiate to the right flank.  She currently has 0 out of 10 abdominal pain.  She does watch what she eats.  She has had an extensive work-up which has been unremarkable for the abdominal pain. Past Medical History:  Diagnosis Date  . ADD (attention deficit disorder)   . Arthritis   . Asthma    mild seasonal reactions to mold and mildew  . Back pain   . Depression   . Fibromyalgia   . GERD (gastroesophageal reflux disease)   . Herpes simplex disease   . History of kidney stones   . Hypertension   . Migraines   . Paresthesia of both hands   . PONV (postoperative nausea and vomiting)   . Urticaria     Past Surgical History:  Procedure Laterality Date  . BIOPSY  09/23/2019   Procedure: BIOPSY;  Surgeon: Fields, Sandi L, MD;  [facility]: [facility];  Service: Endoscopy;;  esophagus  . ENDOMETRIAL ABLATION    . ESOPHAGEAL MANOMETRY N/A 10/15/2017   Procedure: ESOPHAGEAL MANOMETRY (EM);  Surgeon: Nandigam, Kavitha V, MD;  Location: [facility];  Service: Endoscopy;  Laterality: N/A;  . ESOPHAGOGASTRODUODENOSCOPY (EGD) WITH PROPOFOL N/A 12/26/2016   Dr. Fields: one benign-appearing, intrinsic stenosis that was widely patent and traversed. Small hiatal hernia, few small sessile polyps. Chronic gastritis. Normal duodenum, negative celiac sprue.   . ESOPHAGOGASTRODUODENOSCOPY (EGD) WITH PROPOFOL N/A 09/23/2019    Procedure: ESOPHAGOGASTRODUODENOSCOPY (EGD) WITH PROPOFOL;  Surgeon: Fields, Sandi L, MD;  [facility]: [facility];  Service: Endoscopy;  Laterality: N/A;  11:00am  . HERNIA REPAIR     inguinal as a baby   . left ovarian removal due to cyst    . PILONIDAL CYST / SINUS EXCISION  2009  . RECTOCELE REPAIR N/A 10/02/2017   Procedure: POSTERIOR REPAIR (RECTOCELE);  Surgeon: Ferguson, John V, MD;  Location: [facility];  Service: Gynecology;  Laterality: N/A;  . SAVORY DILATION N/A 09/23/2019   Procedure: SAVORY DILATION;  Surgeon: Fields, Sandi L, MD;  [facility]: [facility];  Service: Endoscopy;  Laterality: N/A;  . SINOSCOPY    . TUBAL LIGATION    . VAGINAL HYSTERECTOMY N/A 10/02/2017   Procedure: HYSTERECTOMY VAGINAL;  Surgeon: Ferguson, John V, MD;  Location: [facility];  Service: Gynecology;  Laterality: N/A;    Family History  Problem Relation Age of Onset  . Cancer Mother        skin  . Depression Mother   . Hypertension Mother   . Hyperlipidemia Mother   . Diabetes Mother   . Cancer Father        skin  . Arthritis Father   . Hypertension Father   . Hyperlipidemia Father   . Asthma Brother   . Depression Maternal Grandmother   . Hypertension Maternal Grandmother   . Mental illness Maternal Grandmother   . Cancer Maternal Grandfather          kidney  . Hypertension Maternal Grandfather   . Kidney disease Maternal Grandfather   . Emphysema Paternal Grandmother   . Alzheimer's disease Paternal Grandmother   . Parkinson's disease Paternal Grandfather   . Colon cancer Neg Hx   . Colon polyps Neg Hx   . Allergic rhinitis Neg Hx   . Eczema Neg Hx   . Urticaria Neg Hx     Current Outpatient Medications on File Prior to Visit  Medication Sig Dispense Refill  . FLUoxetine (PROZAC) 20 MG capsule Take 20 mg by mouth daily.    . methylphenidate 18 MG PO CR tablet Take 18 mg by mouth daily.    . pantoprazole (PROTONIX) 40 MG tablet Take 1 tablet (40 mg total) by mouth 2 (two) times daily  before a meal. 180 tablet 1  . propranolol ER (INDERAL LA) 80 MG 24 hr capsule Take 80 mg by mouth at bedtime.     . SUMAtriptan (IMITREX) 50 MG tablet Take 50 mg by mouth every 2 (two) hours as needed for migraine.      Current Facility-Administered Medications on File Prior to Visit  Medication Dose Route Frequency Provider Last Rate Last Admin  . methylPREDNISolone acetate (DEPO-MEDROL) injection 40 mg  40 mg Other Once Tyrell Antonio, MD        Allergies  Allergen Reactions  . Beef-Derived Products     Tick bite-Alpha  - RESOLVED  . Dilaudid [Hydromorphone Hcl] Nausea And Vomiting  . Duloxetine Other (See Comments)    Felt like she was floating and "didn't feel right" on it  . Other Hives    Alpha Gal cause break out hives  . Sulfa Antibiotics Nausea Only  . Toradol [Ketorolac Tromethamine] Nausea Only  . Vicodin [Hydrocodone-Acetaminophen] Itching    Social History   Substance and Sexual Activity  Alcohol Use No  . Alcohol/week: 0.0 standard drinks    Social History   Tobacco Use  Smoking Status Never Smoker  Smokeless Tobacco Never Used    Review of Systems  Constitutional: Positive for malaise/fatigue.  HENT: Negative.   Eyes: Positive for pain.  Respiratory: Positive for shortness of breath.   Cardiovascular: Negative.   Gastrointestinal: Positive for abdominal pain, heartburn, nausea and vomiting.  Genitourinary: Negative.   Musculoskeletal: Positive for back pain, joint pain and neck pain.  Skin: Negative.   Neurological: Negative.   Endo/Heme/Allergies: Negative.   Psychiatric/Behavioral: Negative.     Objective   Vitals:   02/17/20 1310  BP: 138/83  Pulse: 86  Resp: 15  Temp: (!) 96.6 F (35.9 C)  SpO2: 96%    Physical Exam Vitals reviewed.  Constitutional:      Appearance: Normal appearance. She is not ill-appearing.  HENT:     Head: Normocephalic and atraumatic.  Eyes:     General: No scleral icterus. Cardiovascular:     Rate  and Rhythm: Normal rate and regular rhythm.     Heart sounds: Normal heart sounds. No murmur. No friction rub. No gallop.   Pulmonary:     Effort: Pulmonary effort is normal. No respiratory distress.     Breath sounds: Normal breath sounds. No stridor. No wheezing, rhonchi or rales.  Abdominal:     General: Bowel sounds are normal. There is no distension.     Palpations: Abdomen is soft. There is no mass.     Tenderness: There is no abdominal tenderness. There is no guarding or rebound.     Hernia: No hernia is present.  Skin:    General: Skin is warm and dry.  Neurological:     Mental Status: She is alert and oriented to person, place, and time.    GI notes reviewed Assessment  Abdominal pain consistent with biliary colic.  She has other issues that may be contributing to her biliary colic.  She understands that by taking out her gallbladder, this will limit eliminate any further issues concerning her gallbladder. Plan   Patient is scheduled for laparoscopic cholecystectomy on 04/09/2020.  The risks and benefits of the procedure including bleeding, infection, hepatobiliary injury, recurrence of her symptoms, and the possibility of an open procedure were fully explained to the patient, who gave informed consent. 

## 2020-03-25 ENCOUNTER — Ambulatory Visit: Payer: Medicaid Other | Admitting: Orthopaedic Surgery

## 2020-03-25 ENCOUNTER — Other Ambulatory Visit: Payer: Self-pay

## 2020-04-06 ENCOUNTER — Encounter: Payer: Self-pay | Admitting: Nurse Practitioner

## 2020-04-06 ENCOUNTER — Encounter (HOSPITAL_COMMUNITY): Payer: Self-pay

## 2020-04-06 ENCOUNTER — Other Ambulatory Visit (HOSPITAL_COMMUNITY)
Admission: RE | Admit: 2020-04-06 | Discharge: 2020-04-06 | Disposition: A | Discharge status: Home / Self Care | Payer: Medicaid Other | Source: Ambulatory Visit | Attending: General Surgery | Admitting: General Surgery

## 2020-04-06 ENCOUNTER — Encounter (HOSPITAL_COMMUNITY)
Admission: RE | Admit: 2020-04-06 | Discharge: 2020-04-06 | Disposition: A | Discharge status: Home / Self Care | Payer: Medicaid Other | Source: Ambulatory Visit | Attending: General Surgery | Admitting: General Surgery

## 2020-04-06 ENCOUNTER — Other Ambulatory Visit: Payer: Self-pay

## 2020-04-06 ENCOUNTER — Ambulatory Visit (INDEPENDENT_AMBULATORY_CARE_PROVIDER_SITE_OTHER): Payer: Medicaid Other | Admitting: Nurse Practitioner

## 2020-04-06 VITALS — BP 141/97 | HR 74 | Temp 97.2°F | Ht 63.0 in | Wt 194.2 lb

## 2020-04-06 DIAGNOSIS — K219 Gastro-esophageal reflux disease without esophagitis: Secondary | ICD-10-CM | POA: Diagnosis not present

## 2020-04-06 DIAGNOSIS — Z20822 Contact with and (suspected) exposure to covid-19: Secondary | ICD-10-CM | POA: Diagnosis not present

## 2020-04-06 DIAGNOSIS — R109 Unspecified abdominal pain: Secondary | ICD-10-CM | POA: Diagnosis not present

## 2020-04-06 DIAGNOSIS — R197 Diarrhea, unspecified: Secondary | ICD-10-CM | POA: Diagnosis not present

## 2020-04-06 LAB — SARS CORONAVIRUS 2 (TAT 6-24 HRS): SARS Coronavirus 2: NEGATIVE

## 2020-04-06 NOTE — Patient Instructions (Addendum)
Your health issues we discussed today were:   Abdominal pain: 1. Best of luck with your gallbladder removal! 2. We can evaluate for any persistent pain at your follow-up visit  GERD (reflux/heartburn): 1. We got your medication is controlling your symptoms 2. Continue taking your medication (Protonix 40 mg twice daily) and let us know if you have any worsening or severe symptoms  Loose stools/soft stools: 1. After your gallbladder removal, stools will often change temporarily and then settle into a regular pattern 2. We will see you in follow-up and to further address your stools once they settle into their "new normal" 3. Call us for any worsening or severe symptoms  Overall I recommend:  1. Continue your other current medications 2. Return for follow-up 4 months 3. Call us for any questions or concerns   At [facility] we value your feedback. You may receive a survey about your visit today. Please share your experience as we strive to create trusting relationships with our patients to provide genuine, compassionate, quality care.  We appreciate your understanding and patience as we review any laboratory studies, imaging, and other diagnostic tests that are ordered as we care for you. Our office policy is 5 business days for review of these results, and any emergent or urgent results are addressed in a timely manner for your best interest. If you do not hear from our office in 1 week, please contact us.   We also encourage the use of MyChart, which contains your medical information for your review as well. If you are not enrolled in this feature, an access code is on this after visit summary for your convenience. Thank you for allowing [place] to be involved in your care.  It was great to see you today!  I hope you have a great Summer!!

## 2020-04-06 NOTE — Patient Instructions (Signed)
Ruth Shah  04/06/2020     @PREFPERIOPPHARMACY @   Your procedure is scheduled on 04/08/2020.  Report to 04/10/2020 at 6:15 A.M.  Call this number if you have problems the morning of surgery:  [phone]   Remember:  Do not eat or drink after midnight.   Take these medicines the morning of surgery with A SIP OF WATER: Prozac, Protonix and Imitrex if needed   Do not wear jewelry, make-up or nail polish.  Do not wear lotions, powders, or perfumes, or deodorant.  Do not shave 48 hours prior to surgery.  Men may shave face and neck.  Do not bring valuables to the hospital.  [facility] is not responsible for any belongings or valuables.  Contacts, dentures or bridgework may not be worn into surgery.  Leave your suitcase in the car.  After surgery it may be brought to your room.  For patients admitted to the hospital, discharge time will be determined by your treatment team.  Patients discharged the day of surgery will not be allowed to drive home.   Name and phone number of your driver:   family Special instructions:  n/a  Please read over the following fact sheets that you were given. Care and Recovery After Surgery    Laparoscopic Cholecystectomy Laparoscopic cholecystectomy is surgery to remove the gallbladder. The gallbladder is a pear-shaped organ that lies beneath the liver on the right side of the body. The gallbladder stores bile, which is a fluid that helps the body to digest fats. Cholecystectomy is often done for inflammation of the gallbladder (cholecystitis). This condition is usually caused by a buildup of gallstones (cholelithiasis) in the gallbladder. Gallstones can block the flow of bile, which can result in inflammation and pain. In severe cases, emergency surgery may be required. This procedure is done though small incisions in your abdomen (laparoscopic surgery). A thin scope with a camera (laparoscope) is inserted through one incision. Thin surgical  instruments are inserted through the other incisions. In some cases, a laparoscopic procedure may be turned into a type of surgery that is done through a larger incision (open surgery). Tell a health care provider about:  Any allergies you have.  All medicines you are taking, including vitamins, herbs, eye drops, creams, and over-the-counter medicines.  Any problems you or family members have had with anesthetic medicines.  Any blood disorders you have.  Any surgeries you have had.  Any medical conditions you have.  Whether you are pregnant or may be pregnant. What are the risks? Generally, this is a safe procedure. However, problems may occur, including:  Infection.  Bleeding.  Allergic reactions to medicines.  Damage to other structures or organs.  A stone remaining in the common bile duct. The common bile duct carries bile from the gallbladder into the small intestine.  A bile leak from the cyst duct that is clipped when your gallbladder is removed. What happens before the procedure? Staying hydrated Follow instructions from your health care provider about hydration, which may include:  Up to 2 hours before the procedure - you may continue to drink clear liquids, such as water, clear fruit juice, black coffee, and plain tea. Eating and drinking restrictions Follow instructions from your health care provider about eating and drinking, which may include:  8 hours before the procedure - stop eating heavy meals or foods such as meat, fried foods, or fatty foods.  6 hours before the procedure - stop eating light meals or foods,  such as toast or cereal.  6 hours before the procedure - stop drinking milk or drinks that contain milk.  2 hours before the procedure - stop drinking clear liquids. Medicines  Ask your health care provider about: ? Changing or stopping your regular medicines. This is especially important if you are taking diabetes medicines or blood  thinners. ? Taking medicines such as aspirin and ibuprofen. These medicines can thin your blood. Do not take these medicines before your procedure if your health care provider instructs you not to.  You may be given antibiotic medicine to help prevent infection. General instructions  Let your health care provider know if you develop a cold or an infection before surgery.  Plan to have someone take you home from the hospital or clinic.  Ask your health care provider how your surgical site will be marked or identified. What happens during the procedure?   To reduce your risk of infection: ? Your health care team will wash or sanitize their hands. ? Your skin will be washed with soap. ? Hair may be removed from the surgical area.  An IV tube may be inserted into one of your veins.  You will be given one or more of the following: ? A medicine to help you relax (sedative). ? A medicine to make you fall asleep (general anesthetic).  A breathing tube will be placed in your mouth.  Your surgeon will make several small cuts (incisions) in your abdomen.  The laparoscope will be inserted through one of the small incisions. The camera on the laparoscope will send images to a TV screen (monitor) in the operating room. This lets your surgeon see inside your abdomen.  Air-like gas will be pumped into your abdomen. This will expand your abdomen to give the surgeon more room to perform the surgery.  Other tools that are needed for the procedure will be inserted through the other incisions. The gallbladder will be removed through one of the incisions.  Your common bile duct may be examined. If stones are found in the common bile duct, they may be removed.  After your gallbladder has been removed, the incisions will be closed with stitches (sutures), staples, or skin glue.  Your incisions may be covered with a bandage (dressing). The procedure may vary among health care providers and  hospitals. What happens after the procedure?  Your blood pressure, heart rate, breathing rate, and blood oxygen level will be monitored until the medicines you were given have worn off.  You will be given medicines as needed to control your pain.  Do not drive for 24 hours if you were given a sedative. This information is not intended to replace advice given to you by your health care provider. Make sure you discuss any questions you have with your health care provider. Document Revised: 08/24/2017 Document Reviewed: 02/28/2016 Elsevier Patient Education  2020 [employer].   How to Use Chlorhexidine for Bathing Chlorhexidine gluconate (CHG) is a germ-killing (antiseptic) solution that is used to clean the skin. It can get rid of the bacteria that normally live on the skin and can keep them away for about 24 hours. To clean your skin with CHG, you may be given:  A CHG solution to use in the shower or as part of a sponge bath.  A prepackaged cloth that contains CHG. Cleaning your skin with CHG may help lower the risk for infection:  While you are staying in the intensive care unit of the hospital.  If you have a vascular access, such as a central line, to provide short-term or long-term access to your veins.  If you have a catheter to drain urine from your bladder.  If you are on a ventilator. A ventilator is a machine that helps you breathe by moving air in and out of your lungs.  After surgery. What are the risks? Risks of using CHG include:  A skin reaction.  Hearing loss, if CHG gets in your ears.  Eye injury, if CHG gets in your eyes and is not rinsed out.  The CHG product catching fire. Make sure that you avoid smoking and flames after applying CHG to your skin. Do not use CHG:  If you have a chlorhexidine allergy or have previously reacted to chlorhexidine.  On babies younger than 382 months of age. How to use CHG solution  Use CHG only as told by your health care  provider, and follow the instructions on the label.  Use the full amount of CHG as directed. Usually, this is one bottle. During a shower Follow these steps when using CHG solution during a shower (unless your health care provider gives you different instructions): 1. Start the shower. 2. Use your normal soap and shampoo to wash your face and hair. 3. Turn off the shower or move out of the shower stream. 4. Pour the CHG onto a clean washcloth. Do not use any type of brush or rough-edged sponge. 5. Starting at your neck, lather your body down to your toes. Make sure you follow these instructions: ? If you will be having surgery, pay special attention to the part of your body where you will be having surgery. Scrub this area for at least 1 minute. ? Do not use CHG on your head or face. If the solution gets into your ears or eyes, rinse them well with water. ? Avoid your genital area. ? Avoid any areas of skin that have broken skin, cuts, or scrapes. ? Scrub your back and under your arms. Make sure to wash skin folds. 6. Let the lather sit on your skin for 1-2 minutes or as long as told by your health care provider. 7. Thoroughly rinse your entire body in the shower. Make sure that all body creases and crevices are rinsed well. 8. Dry off with a clean towel. Do not put any substances on your body afterward--such as powder, lotion, or perfume--unless you are told to do so by your health care provider. Only use lotions that are recommended by the manufacturer. 9. Put on clean clothes or pajamas. 10. If it is the night before your surgery, sleep in clean sheets.  During a sponge bath Follow these steps when using CHG solution during a sponge bath (unless your health care provider gives you different instructions): 1. Use your normal soap and shampoo to wash your face and hair. 2. Pour the CHG onto a clean washcloth. 3. Starting at your neck, lather your body down to your toes. Make sure you follow  these instructions: ? If you will be having surgery, pay special attention to the part of your body where you will be having surgery. Scrub this area for at least 1 minute. ? Do not use CHG on your head or face. If the solution gets into your ears or eyes, rinse them well with water. ? Avoid your genital area. ? Avoid any areas of skin that have broken skin, cuts, or scrapes. ? Scrub your back and under your arms.  Make sure to wash skin folds. 4. Let the lather sit on your skin for 1-2 minutes or as long as told by your health care provider. 5. Using a different clean, wet washcloth, thoroughly rinse your entire body. Make sure that all body creases and crevices are rinsed well. 6. Dry off with a clean towel. Do not put any substances on your body afterward--such as powder, lotion, or perfume--unless you are told to do so by your health care provider. Only use lotions that are recommended by the manufacturer. 7. Put on clean clothes or pajamas. 8. If it is the night before your surgery, sleep in clean sheets. How to use CHG prepackaged cloths  Only use CHG cloths as told by your health care provider, and follow the instructions on the label.  Use the CHG cloth on clean, dry skin.  Do not use the CHG cloth on your head or face unless your health care provider tells you to.  When washing with the CHG cloth: ? Avoid your genital area. ? Avoid any areas of skin that have broken skin, cuts, or scrapes. Before surgery Follow these steps when using a CHG cloth to clean before surgery (unless your health care provider gives you different instructions): 1. Using the CHG cloth, vigorously scrub the part of your body where you will be having surgery. Scrub using a back-and-forth motion for 3 minutes. The area on your body should be completely wet with CHG when you are done scrubbing. 2. Do not rinse. Discard the cloth and let the area air-dry. Do not put any substances on the area afterward, such as  powder, lotion, or perfume. 3. Put on clean clothes or pajamas. 4. If it is the night before your surgery, sleep in clean sheets.  For general bathing Follow these steps when using CHG cloths for general bathing (unless your health care provider gives you different instructions). 1. Use a separate CHG cloth for each area of your body. Make sure you wash between any folds of skin and between your fingers and toes. Wash your body in the following order, switching to a new cloth after each step: ? The front of your neck, shoulders, and chest. ? Both of your arms, under your arms, and your hands. ? Your stomach and groin area, avoiding the genitals. ? Your right leg and foot. ? Your left leg and foot. ? The back of your neck, your back, and your buttocks. 2. Do not rinse. Discard the cloth and let the area air-dry. Do not put any substances on your body afterward--such as powder, lotion, or perfume--unless you are told to do so by your health care provider. Only use lotions that are recommended by the manufacturer. 3. Put on clean clothes or pajamas. Contact a health care provider if:  Your skin gets irritated after scrubbing.  You have questions about using your solution or cloth. Get help right away if:  Your eyes become very red or swollen.  Your eyes itch badly.  Your skin itches badly and is red or swollen.  Your hearing changes.  You have trouble seeing.  You have swelling or tingling in your mouth or throat.  You have trouble breathing.  You swallow any chlorhexidine. Summary  Chlorhexidine gluconate (CHG) is a germ-killing (antiseptic) solution that is used to clean the skin. Cleaning your skin with CHG may help to lower your risk for infection.  You may be given CHG to use for bathing. It may be in a bottle  or in a prepackaged cloth to use on your skin. Carefully follow your health care provider's instructions and the instructions on the product label.  Do not use CHG  if you have a chlorhexidine allergy.  Contact your health care provider if your skin gets irritated after scrubbing. This information is not intended to replace advice given to you by your health care provider. Make sure you discuss any questions you have with your health care provider. Document Revised: 11/28/2018 Document Reviewed: 08/09/2017 Elsevier Patient Education  2020 [employer].

## 2020-04-06 NOTE — Progress Notes (Signed)
Referring Provider: Selinda Flavin, MD Primary Care Physician:  Selinda Flavin, MD Primary GI:  Dr. Jena Gauss (in the absence of Dr. Darrick Penna); pending Dr. Marletta Lor  Chief Complaint  Patient presents with  . Abdominal Pain    having gallbladder removed 04/08/20    HPI:   Ruth Shah is a 44 y.o. female who presents for follow-up.  The patient was last seen in our office 01/06/2020 for dysphagia, GERD, rectal pain, abdominal pain.  Noted history of GERD, dysphagia, diarrhea, and abdominal pain with EGD in April 2018 on file, BPE November 2018 with small partial Schatzki's ring without obstruction and episode of dysmotility, manometry in 2019 with normal relaxation of the EG junction without significant esophageal peristaltic abnormality.  Previous HIDA scan 2018 was normal, this was checked due to her abdominal pain.  At her last visit Ruth Shah stated her abdominal pain is more in her side and only occurs if Ruth Shah overexerts, has diagnosed fibromyalgia and struggling to lose weight.  Feels Ruth Shah her dysphagia is becoming somewhat progressive and notes a globus sensation; symptoms about once daily, following chewing precautions but admittedly a fast eater.  Soft stools which is chronic for her and noted to be tolerable and not requesting treatment.  Rectal spasms that last 2 to 5 minutes tried and failed Cardizem due to rapid onset and typically does not have her medication with her.  Protonix controls GERD well.  No other overt GI complaints.  Recommended BPE, continue PPI (refilled), ER precautions given, colonoscopy to evaluate rectal pain, follow-up in 3 months.  Ruth Shah was in the emergency department 02/05/2020 for flank pain and recommended a HIDA scan.  Pain was right upper quadrant just under the rib cage with previous 1 to 1-1/2 years, intermittent; noted worsening with dairy or fatty/greasy foods.  HIDA scan repeated 02/10/2020 which found patent biliary tree with normal EF at 94% following fatty meal  although was symptomatic with Ensure ingestion.  Ruth Shah was referred to a surgeon to discuss.  Surgical eval 02/17/2020, planned laparoscopic cholecystectomy this week.  Today Ruth Shah states Ruth Shah is doing well overall. Ruth Shah is having her gallbladder removed this week. No pain today. If Ruth Shah takes her PPI, GERD and dysphagia symptoms are well controlled. Denies N/V, hematochezia, melena, fever, chills, unintentional weight loss. Still with loose stools, but wants to wait until after surgery to discuss. Denies URI or flu-like symptoms. Denies loss of sense of taste or smell. The patient has not received COVID-19 vaccination(s). They are not interested in scheduling information at this time. Denies chest pain, dyspnea, dizziness, lightheadedness, syncope, near syncope. Denies any other upper or lower GI symptoms.  Past Medical History:  Diagnosis Date  . ADD (attention deficit disorder)   . Arthritis   . Asthma    mild seasonal reactions to mold and mildew  . Back pain   . Depression   . Fibromyalgia   . GERD (gastroesophageal reflux disease)   . Herpes simplex disease   . History of kidney stones   . Hypertension   . Migraines   . Paresthesia of both hands   . PONV (postoperative nausea and vomiting)   . Urticaria     Past Surgical History:  Procedure Laterality Date  . BIOPSY  09/23/2019   Procedure: BIOPSY;  Surgeon: West Bali, MD;  Location: [facility];  Service: Endoscopy;;  esophagus  . ENDOMETRIAL ABLATION    . ESOPHAGEAL MANOMETRY N/A 10/15/2017   Procedure: ESOPHAGEAL MANOMETRY (EM);  Surgeon:  Napoleon FormNandigam, Kavitha V, MD;  [facility];  Service: Endoscopy;  Laterality: N/A;  . ESOPHAGOGASTRODUODENOSCOPY (EGD) WITH PROPOFOL N/A 12/26/2016   Dr. Darrick PennaFields: one benign-appearing, intrinsic stenosis that was widely patent and traversed. Small hiatal hernia, few small sessile polyps. Chronic gastritis. Normal duodenum, negative celiac sprue.   . ESOPHAGOGASTRODUODENOSCOPY (EGD) WITH  PROPOFOL N/A 09/23/2019   Procedure: ESOPHAGOGASTRODUODENOSCOPY (EGD) WITH PROPOFOL;  Surgeon: West BaliFields, Sandi L, MD;  Location: [facility];  Service: Endoscopy;  Laterality: N/A;  11:00am  . HERNIA REPAIR     inguinal as a baby   . left ovarian removal due to cyst    . PILONIDAL CYST / SINUS EXCISION  2009  . RECTOCELE REPAIR N/A 10/02/2017   Procedure: POSTERIOR REPAIR (RECTOCELE);  Surgeon: Tilda BurrowFerguson, John V, MD;  Location: [facility];  Service: Gynecology;  Laterality: N/A;  . SAVORY DILATION N/A 09/23/2019   Procedure: SAVORY DILATION;  Surgeon: West BaliFields, Sandi L, MD;  Location: [facility];  Service: Endoscopy;  Laterality: N/A;  . SINOSCOPY    . TUBAL LIGATION    . VAGINAL HYSTERECTOMY N/A 10/02/2017   Procedure: HYSTERECTOMY VAGINAL;  Surgeon: Tilda BurrowFerguson, John V, MD;  Location: [facility];  Service: Gynecology;  Laterality: N/A;    Current Outpatient Medications  Medication Sig Dispense Refill  . ibuprofen (ADVIL) 200 MG tablet Take 400 mg by mouth every 6 (six) hours as needed for headache or moderate pain.    . methylphenidate 18 MG PO CR tablet Take 18 mg by mouth daily.    . pantoprazole (PROTONIX) 40 MG tablet Take 1 tablet (40 mg total) by mouth 2 (two) times daily before a meal. 180 tablet 1  . propranolol ER (INDERAL LA) 80 MG 24 hr capsule Take 80 mg by mouth at bedtime.     . SUMAtriptan (IMITREX) 50 MG tablet Take 50 mg by mouth every 2 (two) hours as needed for migraine.      Current Facility-Administered Medications  Medication Dose Route Frequency Provider Last Rate Last Admin  . methylPREDNISolone acetate (DEPO-MEDROL) injection 40 mg  40 mg Other Once Tyrell AntonioNewton, Frederic, MD        Allergies as of 04/06/2020 - Review Complete 04/06/2020  Allergen Reaction Noted  . Dilaudid [hydromorphone hcl] Nausea And Vomiting 10/10/2017  . Duloxetine Other (See Comments) 06/13/2018  . Sulfa antibiotics Nausea Only 07/20/2016  . Toradol [ketorolac tromethamine] Nausea Only 07/20/2016  .  Vicodin [hydrocodone-acetaminophen] Itching 08/12/2015    Family History  Problem Relation Age of Onset  . Cancer Mother        skin  . Depression Mother   . Hypertension Mother   . Hyperlipidemia Mother   . Diabetes Mother   . Cancer Father        skin  . Arthritis Father   . Hypertension Father   . Hyperlipidemia Father   . Asthma Brother   . Depression Maternal Grandmother   . Hypertension Maternal Grandmother   . Mental illness Maternal Grandmother   . Cancer Maternal Grandfather        kidney  . Hypertension Maternal Grandfather   . Kidney disease Maternal Grandfather   . Emphysema Paternal Grandmother   . Alzheimer's disease Paternal Grandmother   . Parkinson's disease Paternal Grandfather   . Colon cancer Neg Hx   . Colon polyps Neg Hx   . Allergic rhinitis Neg Hx   . Eczema Neg Hx   . Urticaria Neg Hx     Social History  Socioeconomic History  . Marital status: Married    Spouse name: Not on file  . Number of children: 4  . Years of education: Some College  . Highest education level: Not on file  Occupational History  . Not on file  Tobacco Use  . Smoking status: Never Smoker  . Smokeless tobacco: Never Used  Vaping Use  . Vaping Use: Never used  Substance and Sexual Activity  . Alcohol use: No    Alcohol/week: 0.0 standard drinks  . Drug use: No  . Sexual activity: Yes    Birth control/protection: Surgical    Comment: hyst  Other Topics Concern  . Not on file  Social History Narrative   Lives at home with husband and children.   Right-handed.   4 cups caffeine per week.   Social Determinants of Health   Financial Resource Strain:   . Difficulty of Paying Living Expenses:   Food Insecurity:   . Worried About Programme researcher, broadcasting/film/video in the Last Year:   . Barista in the Last Year:   Transportation Needs:   . Freight forwarder (Medical):   Marland Kitchen Lack of Transportation (Non-Medical):   Physical Activity:   . Days of Exercise per  Week:   . Minutes of Exercise per Session:   Stress:   . Feeling of Stress :   Social Connections:   . Frequency of Communication with Friends and Family:   . Frequency of Social Gatherings with Friends and Family:   . Attends Religious Services:   . Active Member of Clubs or Organizations:   . Attends Banker Meetings:   Marland Kitchen Marital Status:     Subjective: Review of Systems  Constitutional: Negative for chills, fever, malaise/fatigue and weight loss.  HENT: Negative for congestion and sore throat.   Respiratory: Negative for cough and shortness of breath.   Cardiovascular: Negative for chest pain and palpitations.  Gastrointestinal: Positive for abdominal pain and diarrhea. Negative for blood in stool, melena, nausea and vomiting.  Musculoskeletal: Negative for joint pain and myalgias.  Skin: Negative for rash.  Neurological: Negative for dizziness and weakness.  Endo/Heme/Allergies: Does not bruise/bleed easily.  Psychiatric/Behavioral: Negative for depression. The patient is not nervous/anxious.   All other systems reviewed and are negative.    Objective: BP (!) 141/97   Pulse 74   Temp (!) 97.2 F (36.2 C) (Oral)   Ht 5\' 3"  (1.6 m)   Wt 194 lb 3.2 oz (88.1 kg)   BMI 34.40 kg/m  Physical Exam Vitals and nursing note reviewed.  Constitutional:      General: Ruth Shah is not in acute distress.    Appearance: Normal appearance. Ruth Shah is well-developed. Ruth Shah is not ill-appearing, toxic-appearing or diaphoretic.  HENT:     Head: Normocephalic and atraumatic.     Nose: No congestion or rhinorrhea.  Eyes:     General: No scleral icterus. Cardiovascular:     Rate and Rhythm: Normal rate and regular rhythm.     Heart sounds: Normal heart sounds.  Pulmonary:     Effort: Pulmonary effort is normal. No respiratory distress.     Breath sounds: Normal breath sounds.  Abdominal:     General: Bowel sounds are normal.     Palpations: Abdomen is soft. There is no  hepatomegaly, splenomegaly or mass.     Tenderness: There is abdominal tenderness in the right upper quadrant. There is no guarding or rebound.     Hernia: No  hernia is present.     Comments: Mild TTP RUQ  Skin:    General: Skin is warm and dry.     Coloration: Skin is not jaundiced.     Findings: No rash.  Neurological:     General: No focal deficit present.     Mental Status: Ruth Shah is alert and oriented to person, place, and time.  Psychiatric:        Attention and Perception: Attention normal.        Mood and Affect: Mood normal.        Speech: Speech normal.        Behavior: Behavior normal.        Thought Content: Thought content normal.        Cognition and Memory: Cognition and memory normal.       04/06/2020 9:00 AM   Disclaimer: This note was dictated with voice recognition software. Similar sounding words can inadvertently be transcribed and may not be corrected upon review.

## 2020-04-06 NOTE — Assessment & Plan Note (Addendum)
Noted abdominal pain likely due to biliary colic.  Is scheduled for laparoscopic cholecystectomy this week.  Follow-up with surgeon for procedure and follow-up in our office in 4 months.Marland Kitchen

## 2020-04-06 NOTE — Assessment & Plan Note (Signed)
Well managed on PPI. Symptoms stay away as long she takes her medication. Recommend continue current medications and follow-up in 4 months.

## 2020-04-06 NOTE — Assessment & Plan Note (Signed)
Stools are variable, still having pretty regular soft/loose stools. She is wanting to proceed with her upcoming cholecystectomy this week and see if this makes any changes. We will not plan to add or change any medications at this time. I will see her in follow-up 4 months after surgery to give time for any skin changes to occur status post cholecystectomy at which point we can address her symptoms further.

## 2020-04-07 ENCOUNTER — Other Ambulatory Visit (HOSPITAL_COMMUNITY): Payer: Medicaid Other

## 2020-04-08 ENCOUNTER — Encounter (HOSPITAL_COMMUNITY): Payer: Self-pay | Admitting: General Surgery

## 2020-04-08 ENCOUNTER — Ambulatory Visit (HOSPITAL_COMMUNITY): Payer: Medicaid Other | Admitting: Certified Registered"

## 2020-04-08 ENCOUNTER — Ambulatory Visit (HOSPITAL_COMMUNITY)
Admission: RE | Admit: 2020-04-08 | Discharge: 2020-04-08 | Disposition: A | Discharge status: Home / Self Care | Payer: Medicaid Other | Attending: General Surgery | Admitting: General Surgery

## 2020-04-08 ENCOUNTER — Encounter (HOSPITAL_COMMUNITY)
Admission: RE | Disposition: A | Discharge status: Home / Self Care | Payer: Self-pay | Source: Home / Self Care | Attending: General Surgery

## 2020-04-08 DIAGNOSIS — Z79899 Other long term (current) drug therapy: Secondary | ICD-10-CM | POA: Diagnosis not present

## 2020-04-08 DIAGNOSIS — F329 Major depressive disorder, single episode, unspecified: Secondary | ICD-10-CM | POA: Insufficient documentation

## 2020-04-08 DIAGNOSIS — J45909 Unspecified asthma, uncomplicated: Secondary | ICD-10-CM | POA: Insufficient documentation

## 2020-04-08 DIAGNOSIS — K219 Gastro-esophageal reflux disease without esophagitis: Secondary | ICD-10-CM | POA: Diagnosis not present

## 2020-04-08 DIAGNOSIS — M797 Fibromyalgia: Secondary | ICD-10-CM | POA: Insufficient documentation

## 2020-04-08 DIAGNOSIS — F988 Other specified behavioral and emotional disorders with onset usually occurring in childhood and adolescence: Secondary | ICD-10-CM | POA: Insufficient documentation

## 2020-04-08 DIAGNOSIS — M1990 Unspecified osteoarthritis, unspecified site: Secondary | ICD-10-CM | POA: Diagnosis not present

## 2020-04-08 DIAGNOSIS — K8044 Calculus of bile duct with chronic cholecystitis without obstruction: Secondary | ICD-10-CM | POA: Diagnosis present

## 2020-04-08 DIAGNOSIS — K2950 Unspecified chronic gastritis without bleeding: Secondary | ICD-10-CM | POA: Insufficient documentation

## 2020-04-08 DIAGNOSIS — Z87442 Personal history of urinary calculi: Secondary | ICD-10-CM | POA: Diagnosis not present

## 2020-04-08 DIAGNOSIS — K811 Chronic cholecystitis: Secondary | ICD-10-CM | POA: Diagnosis not present

## 2020-04-08 DIAGNOSIS — I10 Essential (primary) hypertension: Secondary | ICD-10-CM | POA: Diagnosis not present

## 2020-04-08 HISTORY — PX: CHOLECYSTECTOMY: SHX55

## 2020-04-08 SURGERY — LAPAROSCOPIC CHOLECYSTECTOMY
Anesthesia: General

## 2020-04-08 MED ORDER — METOCLOPRAMIDE HCL 5 MG/ML IJ SOLN
INTRAMUSCULAR | Status: DC | PRN
  Administered 2020-04-08: 10 mg via INTRAVENOUS

## 2020-04-08 MED ORDER — PROPOFOL 10 MG/ML IV BOLUS
INTRAVENOUS | Status: AC
  Filled 2020-04-08: qty 20

## 2020-04-08 MED ORDER — ONDANSETRON HCL 4 MG/2ML IJ SOLN
INTRAMUSCULAR | Status: DC | PRN
  Administered 2020-04-08: 4 mg via INTRAVENOUS

## 2020-04-08 MED ORDER — PHENYLEPHRINE 40 MCG/ML (10ML) SYRINGE FOR IV PUSH (FOR BLOOD PRESSURE SUPPORT)
PREFILLED_SYRINGE | INTRAVENOUS | Status: AC
  Filled 2020-04-08: qty 10

## 2020-04-08 MED ORDER — BUPIVACAINE LIPOSOME 1.3 % IJ SUSP
INTRAMUSCULAR | Status: AC
  Filled 2020-04-08: qty 20

## 2020-04-08 MED ORDER — ONDANSETRON HCL 4 MG PO TABS: 4.0000 mg | ORAL_TABLET | Freq: Three times a day (TID) | ORAL | 0 refills | Status: DC | PRN

## 2020-04-08 MED ORDER — FENTANYL CITRATE (PF) 100 MCG/2ML IJ SOLN
25.0000 ug | INTRAMUSCULAR | Status: DC | PRN
  Administered 2020-04-08: 50 ug via INTRAVENOUS
  Filled 2020-04-08: qty 2

## 2020-04-08 MED ORDER — SUCCINYLCHOLINE CHLORIDE 20 MG/ML IJ SOLN
INTRAMUSCULAR | Status: DC | PRN
  Administered 2020-04-08: 160 mg via INTRAVENOUS

## 2020-04-08 MED ORDER — PROMETHAZINE HCL 25 MG/ML IJ SOLN
12.5000 mg | Freq: Once | INTRAMUSCULAR | Status: AC
  Administered 2020-04-08: 12.5 mg via INTRAVENOUS

## 2020-04-08 MED ORDER — HEMOSTATIC AGENTS (NO CHARGE) OPTIME
TOPICAL | Status: DC | PRN
  Administered 2020-04-08: 1 via TOPICAL

## 2020-04-08 MED ORDER — PHENYLEPHRINE HCL (PRESSORS) 10 MG/ML IV SOLN
INTRAVENOUS | Status: DC | PRN
  Administered 2020-04-08: 100 ug via INTRAVENOUS

## 2020-04-08 MED ORDER — DEXAMETHASONE SODIUM PHOSPHATE 10 MG/ML IJ SOLN
INTRAMUSCULAR | Status: DC | PRN
  Administered 2020-04-08: 4 mg via INTRAVENOUS

## 2020-04-08 MED ORDER — ONDANSETRON HCL 4 MG/2ML IJ SOLN
4.0000 mg | Freq: Once | INTRAMUSCULAR | Status: AC | PRN
  Administered 2020-04-08: 4 mg via INTRAVENOUS
  Filled 2020-04-08: qty 2

## 2020-04-08 MED ORDER — PROPOFOL 10 MG/ML IV BOLUS
INTRAVENOUS | Status: DC | PRN
  Administered 2020-04-08: 180 mg via INTRAVENOUS
  Administered 2020-04-08: 25 ug/kg/min via INTRAVENOUS

## 2020-04-08 MED ORDER — MIDAZOLAM HCL 2 MG/2ML IJ SOLN
INTRAMUSCULAR | Status: AC
  Filled 2020-04-08: qty 2

## 2020-04-08 MED ORDER — SUGAMMADEX SODIUM 500 MG/5ML IV SOLN
INTRAVENOUS | Status: DC | PRN
  Administered 2020-04-08: 200 mg via INTRAVENOUS

## 2020-04-08 MED ORDER — CHLORHEXIDINE GLUCONATE 0.12 % MT SOLN
15.0000 mL | Freq: Once | OROMUCOSAL | Status: AC
  Administered 2020-04-08: 15 mL via OROMUCOSAL

## 2020-04-08 MED ORDER — CHLORHEXIDINE GLUCONATE CLOTH 2 % EX PADS: 6.0000 | MEDICATED_PAD | Freq: Once | CUTANEOUS | Status: DC

## 2020-04-08 MED ORDER — LIDOCAINE 2% (20 MG/ML) 5 ML SYRINGE
INTRAMUSCULAR | Status: AC
  Filled 2020-04-08: qty 5

## 2020-04-08 MED ORDER — ROCURONIUM BROMIDE 10 MG/ML (PF) SYRINGE
PREFILLED_SYRINGE | INTRAVENOUS | Status: AC
  Filled 2020-04-08: qty 10

## 2020-04-08 MED ORDER — HYDRALAZINE HCL 20 MG/ML IJ SOLN
INTRAMUSCULAR | Status: AC
  Filled 2020-04-08: qty 1

## 2020-04-08 MED ORDER — ONDANSETRON HCL 4 MG/2ML IJ SOLN
INTRAMUSCULAR | Status: AC
  Filled 2020-04-08: qty 2

## 2020-04-08 MED ORDER — LIDOCAINE HCL (CARDIAC) PF 50 MG/5ML IV SOSY
PREFILLED_SYRINGE | INTRAVENOUS | Status: DC | PRN
  Administered 2020-04-08: 100 mg via INTRAVENOUS

## 2020-04-08 MED ORDER — MIDAZOLAM HCL 2 MG/2ML IJ SOLN
INTRAMUSCULAR | Status: DC | PRN
  Administered 2020-04-08: 2 mg via INTRAVENOUS

## 2020-04-08 MED ORDER — METOCLOPRAMIDE HCL 5 MG/ML IJ SOLN
INTRAMUSCULAR | Status: AC
  Filled 2020-04-08: qty 2

## 2020-04-08 MED ORDER — SCOPOLAMINE 1 MG/3DAYS TD PT72
MEDICATED_PATCH | TRANSDERMAL | Status: AC
  Filled 2020-04-08: qty 1

## 2020-04-08 MED ORDER — FENTANYL CITRATE (PF) 100 MCG/2ML IJ SOLN
INTRAMUSCULAR | Status: DC | PRN
  Administered 2020-04-08 (×2): 50 ug via INTRAVENOUS
  Administered 2020-04-08: 150 ug via INTRAVENOUS

## 2020-04-08 MED ORDER — BUPIVACAINE LIPOSOME 1.3 % IJ SUSP
INTRAMUSCULAR | Status: DC | PRN
  Administered 2020-04-08: 20 mL

## 2020-04-08 MED ORDER — CIPROFLOXACIN IN D5W 400 MG/200ML IV SOLN
400.0000 mg | INTRAVENOUS | Status: AC
  Administered 2020-04-08: 400 mg via INTRAVENOUS
  Filled 2020-04-08: qty 200

## 2020-04-08 MED ORDER — LACTATED RINGERS IV SOLN
INTRAVENOUS | Status: DC
  Administered 2020-04-08: 1000 mL via INTRAVENOUS

## 2020-04-08 MED ORDER — FENTANYL CITRATE (PF) 250 MCG/5ML IJ SOLN
INTRAMUSCULAR | Status: AC
  Filled 2020-04-08: qty 5

## 2020-04-08 MED ORDER — SODIUM CHLORIDE 0.9 % IR SOLN
Status: DC | PRN
  Administered 2020-04-08: 1000 mL

## 2020-04-08 MED ORDER — SUCCINYLCHOLINE CHLORIDE 200 MG/10ML IV SOSY
PREFILLED_SYRINGE | INTRAVENOUS | Status: AC
  Filled 2020-04-08: qty 10

## 2020-04-08 MED ORDER — SCOPOLAMINE 1 MG/3DAYS TD PT72
1.0000 | MEDICATED_PATCH | Freq: Once | TRANSDERMAL | Status: AC
  Administered 2020-04-08: 1 via TRANSDERMAL

## 2020-04-08 MED ORDER — CHLORHEXIDINE GLUCONATE 0.12 % MT SOLN
OROMUCOSAL | Status: AC
  Filled 2020-04-08: qty 15

## 2020-04-08 MED ORDER — SODIUM CHLORIDE FLUSH 0.9 % IV SOLN
INTRAVENOUS | Status: AC
  Filled 2020-04-08: qty 10

## 2020-04-08 MED ORDER — ORAL CARE MOUTH RINSE: 15.0000 mL | Freq: Once | OROMUCOSAL | Status: AC

## 2020-04-08 MED ORDER — DEXAMETHASONE SODIUM PHOSPHATE 10 MG/ML IJ SOLN
INTRAMUSCULAR | Status: AC
  Filled 2020-04-08: qty 1

## 2020-04-08 MED ORDER — TRAMADOL HCL 50 MG PO TABS: 50.0000 mg | ORAL_TABLET | Freq: Four times a day (QID) | ORAL | 0 refills | Status: DC | PRN

## 2020-04-08 MED ORDER — ROCURONIUM BROMIDE 100 MG/10ML IV SOLN
INTRAVENOUS | Status: DC | PRN
  Administered 2020-04-08: 40 mg via INTRAVENOUS

## 2020-04-08 MED ORDER — PROMETHAZINE HCL 25 MG/ML IJ SOLN
INTRAMUSCULAR | Status: AC
  Filled 2020-04-08: qty 1

## 2020-04-08 SURGICAL SUPPLY — 42 items
APPLICATOR ARISTA FLEXITIP XL (MISCELLANEOUS) IMPLANT
APPLIER CLIP ROT 10 11.4 M/L (STAPLE) ×3
BAG RETRIEVAL 10 (BASKET) ×1
BAG RETRIEVAL 10MM (BASKET) ×1
CHLORAPREP W/TINT 26 (MISCELLANEOUS) IMPLANT
CLIP APPLIE ROT 10 11.4 M/L (STAPLE) ×1 IMPLANT
CLOTH BEACON ORANGE TIMEOUT ST (SAFETY) ×3 IMPLANT
COVER LIGHT HANDLE STERIS (MISCELLANEOUS) ×6 IMPLANT
COVER WAND RF STERILE (DRAPES) ×3 IMPLANT
DERMABOND ADVANCED (GAUZE/BANDAGES/DRESSINGS) ×6
DERMABOND ADVANCED .7 DNX12 (GAUZE/BANDAGES/DRESSINGS) ×3 IMPLANT
ELECT REM PT RETURN 9FT ADLT (ELECTROSURGICAL) ×3
ELECTRODE REM PT RTRN 9FT ADLT (ELECTROSURGICAL) ×1 IMPLANT
GLOVE BIOGEL PI IND STRL 7.0 (GLOVE) ×2 IMPLANT
GLOVE BIOGEL PI INDICATOR 7.0 (GLOVE) ×4
GLOVE SURG SS PI 7.5 STRL IVOR (GLOVE) ×3 IMPLANT
GOWN STRL REUS W/TWL LRG LVL3 (GOWN DISPOSABLE) ×9 IMPLANT
HEMOSTAT ARISTA ABSORB 3G PWDR (HEMOSTASIS) IMPLANT
HEMOSTAT SNOW SURGICEL 2X4 (HEMOSTASIS) ×3 IMPLANT
INST SET LAPROSCOPIC AP (KITS) ×3 IMPLANT
KIT TURNOVER KIT A (KITS) ×3 IMPLANT
MANIFOLD NEPTUNE II (INSTRUMENTS) ×3 IMPLANT
NEEDLE HYPO 18GX1.5 BLUNT FILL (NEEDLE) ×3 IMPLANT
NEEDLE HYPO 22GX1.5 SAFETY (NEEDLE) ×3 IMPLANT
NEEDLE INSUFFLATION 14GA 120MM (NEEDLE) ×3 IMPLANT
NS IRRIG 1000ML POUR BTL (IV SOLUTION) ×3 IMPLANT
PACK LAP CHOLE LZT030E (CUSTOM PROCEDURE TRAY) ×3 IMPLANT
PAD ARMBOARD 7.5X6 YLW CONV (MISCELLANEOUS) ×3 IMPLANT
SET BASIN LINEN APH (SET/KITS/TRAYS/PACK) ×3 IMPLANT
SET TUBE SMOKE EVAC HIGH FLOW (TUBING) ×3 IMPLANT
SLEEVE ENDOPATH XCEL 5M (ENDOMECHANICALS) ×3 IMPLANT
SUT MNCRL AB 4-0 PS2 18 (SUTURE) ×6 IMPLANT
SUT VICRYL 0 UR6 27IN ABS (SUTURE) ×3 IMPLANT
SYR 20ML LL LF (SYRINGE) ×6 IMPLANT
SYS BAG RETRIEVAL 10MM (BASKET) ×1
SYSTEM BAG RETRIEVAL 10MM (BASKET) ×1 IMPLANT
TROCAR ENDO BLADELESS 11MM (ENDOMECHANICALS) ×3 IMPLANT
TROCAR XCEL NON-BLD 5MMX100MML (ENDOMECHANICALS) ×3 IMPLANT
TROCAR XCEL UNIV SLVE 11M 100M (ENDOMECHANICALS) ×3 IMPLANT
TUBE CONNECTING 12'X1/4 (SUCTIONS) ×1
TUBE CONNECTING 12X1/4 (SUCTIONS) ×2 IMPLANT
WARMER LAPAROSCOPE (MISCELLANEOUS) ×3 IMPLANT

## 2020-04-08 NOTE — Anesthesia Procedure Notes (Signed)
Procedure Name: Intubation Performed by: Burel Kahre L, CRNA Pre-anesthesia Checklist: Patient identified, Emergency Drugs available, Suction available, Patient being monitored and Timeout performed Patient Re-evaluated:Patient Re-evaluated prior to induction Oxygen Delivery Method: Circle system utilized Preoxygenation: Pre-oxygenation with 100% oxygen Induction Type: IV induction Laryngoscope Size: Miller and 2 Grade View: Grade II Tube size: 7.0 mm Number of attempts: 1 Airway Equipment and Method: Stylet Placement Confirmation: ETT inserted through vocal cords under direct vision,  positive ETCO2,  CO2 detector and breath sounds checked- equal and bilateral Secured at: 21 cm Tube secured with: Tape Dental Injury: Teeth and Oropharynx as per pre-operative assessment        

## 2020-04-08 NOTE — Progress Notes (Signed)
   04/08/20 0923  Note  Observations DBP 123mmHg/continued nausea

## 2020-04-08 NOTE — Op Note (Signed)
Patient:  Ruth Shah  DOB:  1975-10-12  MRN:  [id number]   Preop Diagnosis: Chronic cholecystitis  Postop Diagnosis: Same  Procedure: Laparoscopic cholecystectomy  Surgeon: Franky Macho, MD  Anes: General endotracheal  Indications: Patient is a 44 year old white female who presents with biliary colic secondary to chronic cholecystitis.  The risks and benefits of the procedure including bleeding, infection, hepatobiliary injury, and the possibility of recurrence of her symptoms, and the possibility of an open procedure were fully explained to the patient, who gave informed consent.  Procedure note: The patient was placed in the supine position.  After induction of general endotracheal anesthesia, the abdomen was prepped and draped using usual sterile technique with ChloraPrep.  Surgical site confirmation was performed.  A supraumbilical incision was made down to the fascia.  A Veress needle was introduced into the abdominal cavity and confirmation of placement was done using the saline drop test.  The abdomen was then insufflated to 15 mmHg pressure.  An 11 mmHG trocar was introduced into the abdominal cavity under direct visualization without difficulty.  The patient was placed in reverse Trendelenburg position and an additional 1 mm trocar was placed in the epigastric region and 5 mm trochars were placed the right upper quadrant and right flank regions.  The liver was inspected and noted within normal limits.  The gallbladder was retracted in a dynamic fashion in order to provide a critical view of the triangle of Calot.  The cystic duct was first identified.  Its juncture to the infundibulum was fully identified.  Endoclips were placed proximally and distally on the cystic duct, and the cystic duct was divided.  This was likewise done to the cystic artery.  The gallbladder was freed away from the gallbladder fossa using Bovie electrocautery.  The gallbladder was delivered through the  epigastric trocar site using an Endo Catch bag.  The gallbladder fossa was inspected and no abnormal bleeding or bile leakage was noted.  Surgicel was placed in the gallbladder fossa.  All fluid and air were then evacuated from the abdominal cavity prior to removal of the trochars.  All wounds were irrigated with normal saline.  All wounds were injected with Exparel.  All incisions were closed using a 4-0 Monocryl subcuticular suture.  Dermabond was applied.  All tape and needle counts were correct at the end of the procedure.  The patient was extubated in the operating room and transferred to the PACU in stable condition.  Complications: None  EBL: Minimal  Specimen: Gallbladder

## 2020-04-08 NOTE — Discharge Instructions (Signed)
Please follow up with your medical doctor regarding a blood pressure check up   [facility] THE Ruth Shah UNTIL Monday April 12, 2020.  DO NOT USE ANY FURTHER NUMBING MEDICATIONS WITHOUT CONSULTING A PHYSICIAN. PLEASE REMOVE THE SCOPOLAMINE PATCH BEHIND YOUR EAR ON Sunday April 11, 2020. THIS IS USED TO HELP CONTROL NAUSEA. [facility] HANDS AFTER REMOVAL     Laparoscopic Cholecystectomy, Care After This sheet gives you information about how to care for yourself after your procedure. Your health care provider may also give you more specific instructions. If you have problems or questions, contact your health care provider. What can I expect after the procedure? After the procedure, it is common to have:  Pain at your incision sites. You will be given medicines to control this pain.  Mild nausea or vomiting.  Bloating and possible shoulder pain from the air-like gas that was used during the procedure. Follow these instructions at home: Incision care   Follow instructions from your health care provider about how to take care of your incisions. Make sure you: ? Wash your hands with soap and water before you change your bandage (dressing). If soap and water are not available, use hand sanitizer. ? Change your dressing as told by your health care provider. ? Leave stitches (sutures), skin glue, or adhesive strips in place. These skin closures may need to be in place for 2 weeks or longer. If adhesive strip edges start to loosen and curl up, you may trim the loose edges. Do not remove adhesive strips completely unless your health care provider tells you to do that.  Do not take baths, swim, or use a hot tub until your health care provider approves. Ask your health care provider if you can take showers. You may only be allowed to take sponge baths for bathing.  Check your incision area every day for signs of infection. Check for: ? More redness, swelling, or pain. ? More fluid or  blood. ? Warmth. ? Pus or a bad smell. Activity  Do not drive or use heavy machinery while taking prescription pain medicine.  Do not lift anything that is heavier than 10 lb (4.5 kg) until your health care provider approves.  Do not play contact sports until your health care provider approves.  Do not drive for 24 hours if you were given a medicine to help you relax (sedative).  Rest as needed. Do not return to work or school until your health care provider approves. General instructions  Take over-the-counter and prescription medicines only as told by your health care provider.  To prevent or treat constipation while you are taking prescription pain medicine, your health care provider may recommend that you: ? Drink enough fluid to keep your urine clear or pale yellow. ? Take over-the-counter or prescription medicines. ? Eat foods that are high in fiber, such as fresh fruits and vegetables, whole grains, and beans. ? Limit foods that are high in fat and processed sugars, such as fried and sweet foods. Contact a health care provider if:  You develop a rash.  You have more redness, swelling, or pain around your incisions.  You have more fluid or blood coming from your incisions.  Your incisions feel warm to the touch.  You have pus or a bad smell coming from your incisions.  You have a fever.  One or more of your incisions breaks open. Get help right away if:  You have trouble breathing.  You have chest pain.  You have increasing pain in your shoulders.  You faint or feel dizzy when you stand.  You have severe pain in your abdomen.  You have nausea or vomiting that lasts for more than one day.  You have leg pain. This information is not intended to replace advice given to you by your health care provider. Make sure you discuss any questions you have with your health care provider. Document Revised: 08/24/2017 Document Reviewed: 02/28/2016 Elsevier Patient  Education  2020 [employer]       General Anesthesia, Adult, Care After This sheet gives you information about how to care for yourself after your procedure. Your health care provider may also give you more specific instructions. If you have problems or questions, contact your health care provider. What can I expect after the procedure? After the procedure, the following side effects are common:  Pain or discomfort at the IV site.  Nausea.  Vomiting.  Sore throat.  Trouble concentrating.  Feeling cold or chills.  Weak or tired.  Sleepiness and fatigue.  Soreness and body aches. These side effects can affect parts of the body that were not involved in surgery. Follow these instructions at home:  For at least 24 hours after the procedure:  Have a responsible adult stay with you. It is important to have someone help care for you until you are awake and alert.  Rest as needed.  Do not: ? Participate in activities in which you could fall or become injured. ? Drive. ? Use heavy machinery. ? Drink alcohol. ? Take sleeping pills or medicines that cause drowsiness. ? Make important decisions or sign legal documents. ? Take care of children on your own. Eating and drinking  Follow any instructions from your health care provider about eating or drinking restrictions.  When you feel hungry, start by eating small amounts of foods that are soft and easy to digest (bland), such as toast. Gradually return to your regular diet.  Drink enough fluid to keep your urine pale yellow.  If you vomit, rehydrate by drinking water, juice, or clear broth. General instructions  If you have sleep apnea, surgery and certain medicines can increase your risk for breathing problems. Follow instructions from your health care provider about wearing your sleep device: ? Anytime you are sleeping, including during daytime naps. ? While taking prescription pain medicines, sleeping medicines, or  medicines that make you drowsy.  Return to your normal activities as told by your health care provider. Ask your health care provider what activities are safe for you.  Take over-the-counter and prescription medicines only as told by your health care provider.  If you smoke, do not smoke without supervision.  Keep all follow-up visits as told by your health care provider. This is important. Contact a health care provider if:  You have nausea or vomiting that does not get better with medicine.  You cannot eat or drink without vomiting.  You have pain that does not get better with medicine.  You are unable to pass urine.  You develop a skin rash.  You have a fever.  You have redness around your IV site that gets worse. Get help right away if:  You have difficulty breathing.  You have chest pain.  You have blood in your urine or stool, or you vomit blood. Summary  After the procedure, it is common to have a sore throat or nausea. It is also common to feel tired.  Have a responsible adult stay with you  for the first 24 hours after general anesthesia. It is important to have someone help care for you until you are awake and alert.  When you feel hungry, start by eating small amounts of foods that are soft and easy to digest (bland), such as toast. Gradually return to your regular diet.  Drink enough fluid to keep your urine pale yellow.  Return to your normal activities as told by your health care provider. Ask your health care provider what activities are safe for you. This information is not intended to replace advice given to you by your health care provider. Make sure you discuss any questions you have with your health care provider. Document Revised: 09/14/2017 Document Reviewed: 04/27/2017 Elsevier Patient Education  2020 [employer].

## 2020-04-08 NOTE — Interval H&P Note (Signed)
History and Physical Interval Note:  04/08/2020 7:05 AM  Ruth Shah  has presented today for surgery, with the diagnosis of Biliary Colic.  The various methods of treatment have been discussed with the patient and family. After consideration of risks, benefits and other options for treatment, the patient has consented to  Procedure(s): LAPAROSCOPIC CHOLECYSTECTOMY (N/A) as a surgical intervention.  The patient's history has been reviewed, patient examined, no change in status, stable for surgery.  I have reviewed the patient's chart and labs.  Questions were answered to the patient's satisfaction.     Franky Macho

## 2020-04-08 NOTE — Anesthesia Postprocedure Evaluation (Signed)
Anesthesia Post Note  Patient: ANNAI HEICK  Procedure(s) Performed: LAPAROSCOPIC CHOLECYSTECTOMY (N/A )  Patient location during evaluation: PACU Anesthesia Type: General Level of consciousness: awake, oriented, awake and alert and patient cooperative Pain management: pain level controlled Vital Signs Assessment: post-procedure vital signs reviewed and stable Respiratory status: spontaneous breathing, nonlabored ventilation, patient connected to nasal cannula oxygen and respiratory function stable Cardiovascular status: blood pressure returned to baseline and stable Postop Assessment: no headache and no backache Anesthetic complications: no   No complications documented.   Last Vitals:  Vitals:   04/08/20 0649  BP: (!) 151/94  Pulse: 67  Resp: 14  Temp: 36.8 C  SpO2: 98%    Last Pain:  Vitals:   04/08/20 0649  TempSrc: Oral  PainSc: 3                  Brynda Peon

## 2020-04-08 NOTE — Transfer of Care (Signed)
Immediate Anesthesia Transfer of Care Note  Patient: EVALENE VATH  Procedure(s) Performed: LAPAROSCOPIC CHOLECYSTECTOMY (N/A )  Patient Location: PACU  Anesthesia Type:General  Level of Consciousness: awake, alert , oriented and patient cooperative  Airway & Oxygen Therapy: Patient Spontanous Breathing and Patient connected to nasal cannula oxygen  Post-op Assessment: Report given to RN, Post -op Vital signs reviewed and stable and Patient moving all extremities  Post vital signs: Reviewed and stable  Last Vitals:  Vitals Value Taken Time  BP    Temp    Pulse    Resp    SpO2      Last Pain:  Vitals:   04/08/20 0649  TempSrc: Oral  PainSc: 3       Patients Stated Pain Goal: 7 (04/08/20 0649)  Complications: No complications documented.

## 2020-04-08 NOTE — Anesthesia Preprocedure Evaluation (Signed)
Anesthesia Evaluation  Patient identified by MRN, date of birth, ID band Patient awake    Reviewed: Allergy & Precautions, H&P , NPO status , Patient's Chart, lab work & pertinent test results, reviewed documented beta blocker date and time   History of Anesthesia Complications (+) PONV and history of anesthetic complications  Airway Mallampati: II  TM Distance: >3 FB Neck ROM: full    Dental no notable dental hx. (+) Teeth Intact   Pulmonary asthma ,    Pulmonary exam normal breath sounds clear to auscultation       Cardiovascular Exercise Tolerance: Good hypertension, negative cardio ROS   Rhythm:regular Rate:Normal     Neuro/Psych  Headaches, PSYCHIATRIC DISORDERS Depression  Neuromuscular disease    GI/Hepatic Neg liver ROS, hiatal hernia, GERD  Medicated,  Endo/Other  negative endocrine ROS  Renal/GU negative Renal ROS  negative genitourinary   Musculoskeletal   Abdominal   Peds  Hematology negative hematology ROS (+)   Anesthesia Other Findings   Reproductive/Obstetrics negative OB ROS                             Anesthesia Physical Anesthesia Plan  ASA: II  Anesthesia Plan: General   Post-op Pain Management:    Induction:   PONV Risk Score and Plan: 4 or greater and Ondansetron  Airway Management Planned:   Additional Equipment:   Intra-op Plan:   Post-operative Plan:   Informed Consent: I have reviewed the patients History and Physical, chart, labs and discussed the procedure including the risks, benefits and alternatives for the proposed anesthesia with the patient or authorized representative who has indicated his/her understanding and acceptance.     Dental Advisory Given  Plan Discussed with: CRNA  Anesthesia Plan Comments:         Anesthesia Quick Evaluation

## 2020-04-09 LAB — SURGICAL PATHOLOGY

## 2020-04-12 ENCOUNTER — Encounter (HOSPITAL_COMMUNITY): Payer: Self-pay | Admitting: General Surgery

## 2020-04-15 ENCOUNTER — Other Ambulatory Visit: Payer: Self-pay | Admitting: Orthopedic Surgery

## 2020-04-15 ENCOUNTER — Telehealth (INDEPENDENT_AMBULATORY_CARE_PROVIDER_SITE_OTHER): Payer: Self-pay | Admitting: General Surgery

## 2020-04-15 DIAGNOSIS — G5603 Carpal tunnel syndrome, bilateral upper limbs: Secondary | ICD-10-CM

## 2020-04-15 DIAGNOSIS — M79641 Pain in right hand: Secondary | ICD-10-CM

## 2020-04-15 DIAGNOSIS — Z09 Encounter for follow-up examination after completed treatment for conditions other than malignant neoplasm: Secondary | ICD-10-CM

## 2020-04-15 DIAGNOSIS — M7989 Other specified soft tissue disorders: Secondary | ICD-10-CM

## 2020-04-15 NOTE — Progress Notes (Signed)
Subjective:     Ruth Shah  Telephone postoperative visit performed.  Patient states she is doing well.  One the incision site is a little red but no purulent drainage is noted.  She is keeping it clean.  I suspect this is secondary to the subcuticular suture. Objective:    There were no vitals taken for this visit.  General:  alert, cooperative and no distress       Assessment:    Doing well postoperatively.    Plan:   Follow-up as needed.  Total telephone time 3 minutes.

## 2020-05-04 ENCOUNTER — Ambulatory Visit
Admission: RE | Admit: 2020-05-04 | Discharge: 2020-05-04 | Disposition: A | Discharge status: Home / Self Care | Payer: Medicaid Other | Source: Ambulatory Visit | Attending: Orthopedic Surgery | Admitting: Orthopedic Surgery

## 2020-05-04 ENCOUNTER — Other Ambulatory Visit: Payer: Self-pay

## 2020-05-04 DIAGNOSIS — M7989 Other specified soft tissue disorders: Secondary | ICD-10-CM

## 2020-05-04 DIAGNOSIS — M79641 Pain in right hand: Secondary | ICD-10-CM

## 2020-05-04 DIAGNOSIS — G5603 Carpal tunnel syndrome, bilateral upper limbs: Secondary | ICD-10-CM

## 2020-05-04 MED ORDER — IOPAMIDOL (ISOVUE-M 200) INJECTION 41%
3.0000 mL | Freq: Once | INTRAMUSCULAR | Status: AC
  Administered 2020-05-04: 3 mL via INTRA_ARTICULAR

## 2020-08-10 ENCOUNTER — Ambulatory Visit: Payer: Medicaid Other | Admitting: Nurse Practitioner

## 2020-09-30 ENCOUNTER — Ambulatory Visit: Payer: Medicaid Other | Admitting: Nurse Practitioner

## 2020-09-30 ENCOUNTER — Other Ambulatory Visit: Payer: Self-pay

## 2020-09-30 ENCOUNTER — Encounter: Payer: Self-pay | Admitting: Nurse Practitioner

## 2020-09-30 VITALS — BP 144/95 | HR 69 | Temp 97.7°F | Ht 63.0 in | Wt 191.0 lb

## 2020-09-30 DIAGNOSIS — K219 Gastro-esophageal reflux disease without esophagitis: Secondary | ICD-10-CM

## 2020-09-30 DIAGNOSIS — K580 Irritable bowel syndrome with diarrhea: Secondary | ICD-10-CM

## 2020-09-30 DIAGNOSIS — K760 Fatty (change of) liver, not elsewhere classified: Secondary | ICD-10-CM | POA: Insufficient documentation

## 2020-09-30 DIAGNOSIS — R109 Unspecified abdominal pain: Secondary | ICD-10-CM

## 2020-09-30 DIAGNOSIS — K594 Anal spasm: Secondary | ICD-10-CM

## 2020-09-30 DIAGNOSIS — K589 Irritable bowel syndrome without diarrhea: Secondary | ICD-10-CM | POA: Insufficient documentation

## 2020-09-30 MED ORDER — DICYCLOMINE HCL 10 MG PO CAPS: 10.0000 mg | ORAL_CAPSULE | Freq: Four times a day (QID) | ORAL | 2 refills | Status: DC | PRN

## 2020-09-30 NOTE — Progress Notes (Signed)
CC'ED TO PCP 

## 2020-09-30 NOTE — Progress Notes (Signed)
Referring Provider: Selinda Flavin, MD Primary Care Physician:  Selinda Flavin, MD Primary GI:  Dr. Marletta Lor  Chief Complaint  Patient presents with  . Abdominal Pain    Right side, lower rib area. More when she does more than she is supposed to  . Gastroesophageal Reflux    Doing fine.     HPI:   Ruth Shah is a 45 y.o. female who presents for follow-up on GERD and abdominal pain.  Patient was last seen in our office 04/06/2020 for the same as well as diarrhea.  At that time noted history of GERD, dysphagia, diarrhea, and abdominal pain with EGD in April 2018 on file, BPE November 2018 with small partial Schatzki's ring without obstruction, manometry in 2019 with normal relaxation without significant esophageal peristaltic abnormality.  Previous HIDA scan 2018 normal.  She was in the emergency department in May 2021 for abdominal pain worse with greasy foods and recommended HIDA scan which was completed and found normal EF at 94% although symptomatic with Ensure ingestion.  She was referred for surgical evaluation and planned laparoscopic cholecystectomy.  At her last visit she noted scheduled to have gallbladder removed this week.  She takes her PPI her GERD and dysphagia symptoms are well managed.  No other overt GI complaints.  Recommended proceed with surgical removal of the gallbladder, continue Protonix 40 mg twice daily, notify us of any worsening symptoms, cautioned about possible loose stools after cholecystectomy.  Recommended follow-up in 4 months.  It appears she did undergo laparoscopic cholecystectomy 04/08/2020.  Noted good postoperative course on follow-up.  Today she states she doing okay overall. GERD doing well as long as she takes her medication (Protonix 40 mg bid). Her abdominal pain is doing ok, unless she does more activity than she's supposed to. Still in the same spot, unchanged. Cholecystectomy hasn't helped significantly. She is wondering if it is musculoskeletal  such as costochondritis. She also has fibromyalgia with chronic pain. She has a follow-up in the next couple weeks with her PCP and will bring it in. Denies other abdominal pain, N/V, hematochezia, melena, fever, chills, unintentional weight loss. She does have "soft serve" like stools (Bristol 5-6) and leakage. She has occasional IBS flares with lower abdominal cramping/pain and need to defecate at which point she will have diarrhea. She is not currently on any medication for IBS. She at times takes Probiotics and finds she has better stools with this. Denies URI or flu-like symptoms. Denies loss of sense of taste or smell. The patient has not received COVID-19 vaccination(s). They are not interested in vaccine scheduling information. Denies chest pain, dyspnea, dizziness, lightheadedness, syncope, near syncope. Denies any other upper or lower GI symptoms.  At the end of her visit she noted she at times has rectal spasms. Typically more common if she's standing or sitting for too long. Typically lasts 3-5 minutes. Occurs as frequent as 2-3 times a week and as little as once every 2-3 months.  Past Medical History:  Diagnosis Date  . ADD (attention deficit disorder)   . Arthritis   . Asthma    mild seasonal reactions to mold and mildew  . Back pain   . Depression   . Fibromyalgia   . GERD (gastroesophageal reflux disease)   . Herpes simplex disease   . History of kidney stones   . Hypertension   . Migraines   . Paresthesia of both hands   . PONV (postoperative nausea and vomiting)   .  Urticaria     Past Surgical History:  Procedure Laterality Date  . BIOPSY  09/23/2019   Procedure: BIOPSY;  Surgeon: West Bali, MD;  Location: [facility];  Service: Endoscopy;;  esophagus  . CHOLECYSTECTOMY N/A 04/08/2020   Procedure: LAPAROSCOPIC CHOLECYSTECTOMY;  Surgeon: Franky Macho, MD;  Location: [facility];  Service: General;  Laterality: N/A;  . ENDOMETRIAL ABLATION    . ESOPHAGEAL MANOMETRY  N/A 10/15/2017   Procedure: ESOPHAGEAL MANOMETRY (EM);  Surgeon: Napoleon Form, MD;  Location: [facility];  Service: Endoscopy;  Laterality: N/A;  . ESOPHAGOGASTRODUODENOSCOPY (EGD) WITH PROPOFOL N/A 12/26/2016   Dr. Darrick Penna: one benign-appearing, intrinsic stenosis that was widely patent and traversed. Small hiatal hernia, few small sessile polyps. Chronic gastritis. Normal duodenum, negative celiac sprue.   . ESOPHAGOGASTRODUODENOSCOPY (EGD) WITH PROPOFOL N/A 09/23/2019   Procedure: ESOPHAGOGASTRODUODENOSCOPY (EGD) WITH PROPOFOL;  Surgeon: West Bali, MD;  Location: [facility];  Service: Endoscopy;  Laterality: N/A;  11:00am  . HERNIA REPAIR     inguinal as a baby   . left ovarian removal due to cyst    . PILONIDAL CYST / SINUS EXCISION  2009  . RECTOCELE REPAIR N/A 10/02/2017   Procedure: POSTERIOR REPAIR (RECTOCELE);  Surgeon: Tilda Burrow, MD;  Location: [facility];  Service: Gynecology;  Laterality: N/A;  . SAVORY DILATION N/A 09/23/2019   Procedure: SAVORY DILATION;  Surgeon: West Bali, MD;  Location: [facility];  Service: Endoscopy;  Laterality: N/A;  . SINOSCOPY    . TUBAL LIGATION    . VAGINAL HYSTERECTOMY N/A 10/02/2017   Procedure: HYSTERECTOMY VAGINAL;  Surgeon: Tilda Burrow, MD;  Location: [facility];  Service: Gynecology;  Laterality: N/A;    Current Outpatient Medications  Medication Sig Dispense Refill  . ibuprofen (ADVIL) 200 MG tablet Take 400 mg by mouth every 6 (six) hours as needed for headache or moderate pain.    . methylphenidate 18 MG PO CR tablet Take 18 mg by mouth daily.    . ondansetron (ZOFRAN) 4 MG tablet Take 1 tablet (4 mg total) by mouth every 8 (eight) hours as needed for nausea or vomiting. 20 tablet 0  . pantoprazole (PROTONIX) 40 MG tablet Take 1 tablet (40 mg total) by mouth 2 (two) times daily before a meal. 180 tablet 1  . propranolol ER (INDERAL LA) 80 MG 24 hr capsule Take 80 mg by mouth at bedtime.     . SUMAtriptan (IMITREX) 50  MG tablet Take 50 mg by mouth every 2 (two) hours as needed for migraine.     . traMADol (ULTRAM) 50 MG tablet Take 1 tablet (50 mg total) by mouth every 6 (six) hours as needed. 25 tablet 0   Current Facility-Administered Medications  Medication Dose Route Frequency Provider Last Rate Last Admin  . methylPREDNISolone acetate (DEPO-MEDROL) injection 40 mg  40 mg Other Once Tyrell Antonio, MD        Allergies as of 09/30/2020 - Review Complete 09/30/2020  Allergen Reaction Noted  . Dilaudid [hydromorphone hcl] Nausea And Vomiting 10/10/2017  . Duloxetine Other (See Comments) 06/13/2018  . Sulfa antibiotics Nausea Only 07/20/2016  . Toradol [ketorolac tromethamine] Nausea Only 07/20/2016  . Vicodin [hydrocodone-acetaminophen] Itching 08/12/2015    Family History  Problem Relation Age of Onset  . Cancer Mother        skin  . Depression Mother   . Hypertension Mother   . Hyperlipidemia Mother   . Diabetes Mother   .  Cancer Father        skin  . Arthritis Father   . Hypertension Father   . Hyperlipidemia Father   . Asthma Brother   . Depression Maternal Grandmother   . Hypertension Maternal Grandmother   . Mental illness Maternal Grandmother   . Cancer Maternal Grandfather        kidney  . Hypertension Maternal Grandfather   . Kidney disease Maternal Grandfather   . Emphysema Paternal Grandmother   . Alzheimer's disease Paternal Grandmother   . Parkinson's disease Paternal Grandfather   . Colon cancer Neg Hx   . Colon polyps Neg Hx   . Allergic rhinitis Neg Hx   . Eczema Neg Hx   . Urticaria Neg Hx     Social History   Socioeconomic History  . Marital status: Married    Spouse name: Not on file  . Number of children: 4  . Years of education: Some College  . Highest education level: Not on file  Occupational History  . Not on file  Tobacco Use  . Smoking status: Never Smoker  . Smokeless tobacco: Never Used  Vaping Use  . Vaping Use: Never used  Substance  and Sexual Activity  . Alcohol use: No    Alcohol/week: 0.0 standard drinks  . Drug use: No  . Sexual activity: Yes    Birth control/protection: Surgical    Comment: hyst  Other Topics Concern  . Not on file  Social History Narrative   Lives at home with husband and children.   Right-handed.   4 cups caffeine per week.   Social Determinants of Health   Financial Resource Strain: Not on file  Food Insecurity: Not on file  Transportation Needs: Not on file  Physical Activity: Not on file  Stress: Not on file  Social Connections: Not on file    Subjective: Review of Systems  Constitutional: Negative for chills, fever, malaise/fatigue and weight loss.  HENT: Negative for congestion and sore throat.   Respiratory: Negative for cough and shortness of breath.   Cardiovascular: Negative for chest pain and palpitations.  Gastrointestinal: Positive for abdominal pain. Negative for blood in stool, diarrhea, melena, nausea and vomiting.       Rectal spasms  Musculoskeletal: Negative for joint pain and myalgias.  Skin: Negative for rash.  Neurological: Negative for dizziness and weakness.  Endo/Heme/Allergies: Does not bruise/bleed easily.  Psychiatric/Behavioral: Negative for depression. The patient is not nervous/anxious.   All other systems reviewed and are negative.    Objective: BP (!) 144/95   Pulse 69   Temp 97.7 F (36.5 C)   Ht 5\' 3"  (1.6 m)   Wt 191 lb (86.6 kg)   BMI 33.83 kg/m  Physical Exam Vitals and nursing note reviewed.  Constitutional:      General: She is not in acute distress.    Appearance: Normal appearance. She is well-developed. She is obese. She is not ill-appearing, toxic-appearing or diaphoretic.  HENT:     Head: Normocephalic and atraumatic.     Nose: No congestion or rhinorrhea.  Eyes:     General: No scleral icterus. Cardiovascular:     Rate and Rhythm: Normal rate and regular rhythm.     Heart sounds: Normal heart sounds.  Pulmonary:      Effort: Pulmonary effort is normal. No respiratory distress.     Breath sounds: Normal breath sounds.  Abdominal:     General: Bowel sounds are normal.     Palpations: Abdomen  is soft. There is no hepatomegaly, splenomegaly or mass.     Tenderness: There is no abdominal tenderness. There is no guarding or rebound.     Hernia: No hernia is present.  Skin:    General: Skin is warm and dry.     Coloration: Skin is not jaundiced.     Findings: No rash.  Neurological:     General: No focal deficit present.     Mental Status: She is alert and oriented to person, place, and time.  Psychiatric:        Attention and Perception: Attention normal.        Mood and Affect: Mood normal.        Speech: Speech normal.        Behavior: Behavior normal.        Thought Content: Thought content normal.        Cognition and Memory: Cognition and memory normal.      Assessment:  Pleasant 45 year old female presents to follow-up on GERD and abdominal pain.  No red flag/warning signs or symptoms.  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  GERD: Symptoms currently well managed on PPI.  She misses her medication she will have significant symptoms.  Generally she is treated by taking her medicines.  Recommend continue trigger avoidance, continue PPI  IBS: She does have intermittent IBS flares lower abdominal cramping and diarrhea, not currently any medications.  She has previously taken probiotics which she feels helps and she will likely restart these.  She is asking for further assistance with medication management.  At this point I will start her on Bentyl 10 mg 4 times a day as needed for abdominal pain or constipation.  She is to hold this if she becomes constipated and call our office.  Symptoms do not seem severe at this point.  Rectal spasms/rectal pain: Sounds consistent with proctalgia fugax.  She has not had colonoscopy.  Symptoms are  typically brief lasting just a couple minutes.  Variable in frequency anywhere from once every 2 to 3 weeks to as often as 2-3 times a week.  Generally sitting on the commode and relaxing her rectal sphincter muscles provide relief.  I have offered topical cream such as compounded cream with lidocaine and diltiazem, but she would like to hold off for now.  She will notify us if she desires treatment.  Abdominal pain: Symptoms seem more closely aligned with musculoskeletal etiology.  This is more right side area around the lower rib.  No overt abdominal pain.  It hurts worse if she presses on it, specifically on the lower rib area.  Cholecystectomy has not provided relief as she was hopeful it would.  Query possible costochondritis as well, known history of fibromyalgia and chronic pain from this.  She has an upcoming PCP appointment and will bring it up with them.   Plan: 1. Bentyl 10 mg 4 times daily as needed, hold for constipation 2. Monitor proctalgia and let us know if topical therapy is desired 3. Continue other medications 4. Follow-up primary care related to abdominal pain/rib pain 5. Follow-up in 6 months    Thank you for allowing [place] to participate in the care of Aqsa W Malanowski  Wynne Dust, DNP, AGNP-C Adult & Gerontological Nurse Practitioner [facility]   09/30/2020 11:46 AM   Disclaimer: This note was dictated with voice recognition software. Similar sounding words can inadvertently be transcribed and may  not be corrected upon review.

## 2020-09-30 NOTE — Patient Instructions (Signed)
Your health issues we discussed today were:   GERD (reflux/heartburn): 1. I am glad you are doing well with this! 2. Continue taking your acid blocker as you have been 3. Notify us of any worsening or severe symptoms  Irritable bowel syndrome with abdominal pain and diarrhea: 1. I am sending a prescription for Bentyl (dicyclomine) 10 mg.  You can take this up to 4 times a day as needed for abdominal cramping or diarrhea 2. If you develop constipation stop taking Bentyl 3. Call us for any worsening or severe symptoms  Abdominal pain with fatty liver disease: 1. As we discussed, fatty liver disease can cause a little bit of liver inflammation/swelling which can cause some mild discomfort 2. Based on where your pain is, I think it is likely a musculoskeletal cause that is at least contributing, if not solely responsible 3. As we discussed, follow-up with your primary care about this 4. It is good that we have your reflux under control, we know your gallbladder has been removed which can eliminate these from possible contributors to your pain  Rectal spasms (proctalgia fugax): 1. Continue to monitor your symptoms and let us know if you would like to try topical cream to help 2. Call us for any worsening or severe symptoms  Overall I recommend:  1. Continue other current medications 2. Return for follow-up in 6 months 3. Call us for any questions or concerns   ---------------------------------------------------------------  At [facility] we value your feedback. You may receive a survey about your visit today. Please share your experience as we strive to create trusting relationships with our patients to provide genuine, compassionate, quality care.  We appreciate your understanding and patience as we review any laboratory studies, imaging, and other diagnostic tests that are ordered as we care for you. Our office policy is 5 business days for review of these results, and  any emergent or urgent results are addressed in a timely manner for your best interest. If you do not hear from our office in 1 week, please contact us.   We also encourage the use of MyChart, which contains your medical information for your review as well. If you are not enrolled in this feature, an access code is on this after visit summary for your convenience. Thank you for allowing [place] to be involved in your care.  It was great to see you today!  I hope you have a Happy New Year!!    ---------------------------------------------------------------

## 2021-03-03 ENCOUNTER — Encounter: Payer: Self-pay | Admitting: Internal Medicine

## 2021-03-31 ENCOUNTER — Ambulatory Visit: Payer: Medicaid Other | Admitting: Nurse Practitioner

## 2021-04-01 ENCOUNTER — Ambulatory Visit: Payer: Medicaid Other | Admitting: Gastroenterology

## 2021-04-20 ENCOUNTER — Encounter: Payer: Self-pay | Admitting: Internal Medicine

## 2021-04-20 ENCOUNTER — Ambulatory Visit: Payer: Medicaid Other | Admitting: Internal Medicine

## 2021-06-09 ENCOUNTER — Ambulatory Visit (HOSPITAL_COMMUNITY): Payer: Medicaid Other | Attending: Orthopedic Surgery | Admitting: Occupational Therapy

## 2021-06-09 ENCOUNTER — Encounter (HOSPITAL_COMMUNITY): Payer: Self-pay | Admitting: Occupational Therapy

## 2021-06-09 ENCOUNTER — Other Ambulatory Visit: Payer: Self-pay

## 2021-06-09 DIAGNOSIS — M25511 Pain in right shoulder: Secondary | ICD-10-CM | POA: Insufficient documentation

## 2021-06-09 DIAGNOSIS — R29898 Other symptoms and signs involving the musculoskeletal system: Secondary | ICD-10-CM | POA: Diagnosis present

## 2021-06-09 DIAGNOSIS — G8929 Other chronic pain: Secondary | ICD-10-CM | POA: Insufficient documentation

## 2021-06-09 NOTE — Patient Instructions (Signed)

## 2021-06-09 NOTE — Therapy (Signed)
[facility] [address] [place], [place], [address] Phone: [phone]   Fax:  [phone]  Occupational Therapy Evaluation  Patient Details  Name: Ruth Shah MRN: [id number] Date of Birth: [birth year] Referring Provider (OT): Dr. Duwayne Heck   Encounter Date: 06/09/2021   OT End of Session - 06/09/21 0936     Visit Number 1    Number of Visits 8    Date for OT Re-Evaluation 07/09/21    Authorization Type [employer]    Authorization Time Period Requesting 8 visits    Authorization - Visit Number 0    Authorization - Number of Visits 8    OT Start Time 0901    OT Stop Time 0932    OT Time Calculation (min) 31 min    Activity Tolerance Patient tolerated treatment well    Behavior During Therapy WFL for tasks assessed/performed             Past Medical History:  Diagnosis Date   ADD (attention deficit disorder)    Arthritis    Asthma    mild seasonal reactions to mold and mildew   Back pain    Depression    Fibromyalgia    GERD (gastroesophageal reflux disease)    Herpes simplex disease    History of kidney stones    Hypertension    Migraines    Paresthesia of both hands    PONV (postoperative nausea and vomiting)    Urticaria     Past Surgical History:  Procedure Laterality Date   BIOPSY  09/23/2019   Procedure: BIOPSY;  Surgeon: West Bali, MD;  [facility];  Service: Endoscopy;;  esophagus   CHOLECYSTECTOMY N/A 04/08/2020   Procedure: LAPAROSCOPIC CHOLECYSTECTOMY;  Surgeon: Franky Macho, MD;  Location: [facility];  Service: General;  Laterality: N/A;   ENDOMETRIAL ABLATION     ESOPHAGEAL MANOMETRY N/A 10/15/2017   Procedure: ESOPHAGEAL MANOMETRY (EM);  Surgeon: Napoleon Form, MD;  [facility]: [facility];  Service: Endoscopy;  Laterality: N/A;   ESOPHAGOGASTRODUODENOSCOPY (EGD) WITH PROPOFOL N/A 12/26/2016   Dr. Darrick Penna: one benign-appearing, intrinsic stenosis that was widely patent  and traversed. Small hiatal hernia, few small sessile polyps. Chronic gastritis. Normal duodenum, negative celiac sprue.    ESOPHAGOGASTRODUODENOSCOPY (EGD) WITH PROPOFOL N/A 09/23/2019   Procedure: ESOPHAGOGASTRODUODENOSCOPY (EGD) WITH PROPOFOL;  Surgeon: West Bali, MD;  [facility];  Service: Endoscopy;  Laterality: N/A;  11:00am   HERNIA REPAIR     inguinal as a baby    left ovarian removal due to cyst     PILONIDAL CYST / SINUS EXCISION  2009   RECTOCELE REPAIR N/A 10/02/2017   Procedure: POSTERIOR REPAIR (RECTOCELE);  Surgeon: Tilda Burrow, MD;  [facility]: [facility];  Service: Gynecology;  Laterality: N/A;   SAVORY DILATION N/A 09/23/2019   Procedure: SAVORY DILATION;  Surgeon: West Bali, MD;  [facility];  Service: Endoscopy;  Laterality: N/A;   SINOSCOPY     TUBAL LIGATION     VAGINAL HYSTERECTOMY N/A 10/02/2017   Procedure: HYSTERECTOMY VAGINAL;  Surgeon: Tilda Burrow, MD;  Location: [facility];  Service: Gynecology;  Laterality: N/A;    There were no vitals filed for this visit.   Subjective Assessment - 06/09/21 0933     Subjective  S: I've been trying to get into therapy for a long time.    Pertinent History Pt is a 45 y/o female with right shoulder  pain that began approximately 1 year ago. Pt had cortizone shot and MRI which showed partial tear with possible full thickness component of the supraspinatus. Pt was referred to occupational therapy for evaluation and treatment by Dr. Duwayne Heck.    Patient Stated Goals To have less pain.    Currently in Pain? Yes    Pain Score 4     Pain Location Shoulder    Pain Orientation Right    Pain Descriptors / Indicators Aching;Sore    Pain Type Chronic pain    Pain Radiating Towards hand    Pain Onset More than a month ago    Pain Frequency Intermittent    Aggravating Factors  work, use, overhead reaching    Pain Relieving Factors OTC pain medication    Effect of Pain on Daily Activities mod to max  effect on work tasks    Multiple Pain Sites No               [facility] OT Assessment - 06/09/21 0900       Assessment   Medical Diagnosis right shoulder pain    Referring Provider (OT) Dr. Duwayne Heck    Onset Date/Surgical Date --   approximately 10 months to 1 year ago   Hand Dominance Right    Next MD Visit 06/16/2021    Prior Therapy None      Precautions   Precautions None      Balance Screen   Has the patient fallen in the past 6 months No      Prior Function   Level of Independence Independent    Vocation Full time employment    Vocation Requirements Dog groomer-lifting, holding shouldersup, scissoring, reaching, bathing dogs.    Leisure breeds great danes, golden retrievers, french bulldogs-lots of heavy work with arms      ADL   ADL comments Pt is having difficulty with dressing, bathing, reaching up and back to fix hair, reaching up and into cabinets, driving and turning steering wheel. Pt is having difficulty with sleeping.      Written Expression   Dominant Hand Right      Cognition   Overall Cognitive Status Within Functional Limits for tasks assessed      ROM / Strength   AROM / PROM / Strength AROM;PROM;Strength      Palpation   Palpation comment mod fascial restrictions in right upper arm, anterior shoulder, and trapezius region      AROM   Overall AROM Comments Assessed seated, er/IR adducted    AROM Assessment Site Shoulder    Right/Left Shoulder Right    Right Shoulder Flexion 90 Degrees    Right Shoulder ABduction 130 Degrees    Right Shoulder Internal Rotation 90 Degrees    Right Shoulder External Rotation 65 Degrees      PROM   Overall PROM Comments Assessed supine, er/IR adducted    PROM Assessment Site Shoulder    Right/Left Shoulder Right    Right Shoulder Flexion 145 Degrees    Right Shoulder ABduction 147 Degrees    Right Shoulder Internal Rotation 90 Degrees    Right Shoulder External Rotation 38 Degrees      Strength   Overall  Strength Comments Assessed seated, er/IR adducted    Strength Assessment Site Shoulder    Right/Left Shoulder Right    Right Shoulder Flexion 4/5    Right Shoulder ABduction 4+/5    Right Shoulder Internal Rotation 4+/5    Right Shoulder External Rotation  4/5                              OT Education - 06/09/21 0922     Education Details shoulder AA/ROM    Person(s) Educated Patient    Methods Explanation;Demonstration;Handout    Comprehension Verbalized understanding;Returned demonstration              OT Short Term Goals - 06/09/21 0943       OT SHORT TERM GOAL #1   Title Pt will be provided with and educated on HEP to improve RUE mobility required for ADL completion.    Time 4    Period Weeks    Status New    Target Date 07/09/21      OT SHORT TERM GOAL #2   Title Pt will decrease pain in RUE to 4/10 or less to improve ability to sleep for 3+ consecutive hours without waking due to pain.    Time 4    Period Weeks    Status New      OT SHORT TERM GOAL #3   Title Pt will increase RUE A/ROM to [facility] to improve ability to perform work tasks such as reaching up and over dogs and adjusting grooming noose.    Time 4    Period Weeks    Status New      OT SHORT TERM GOAL #4   Title Pt will improve RUE strength to 5/5 throughout to improve ability to lift heavy dogs at work.    Time 4    Period Weeks    Status New      OT SHORT TERM GOAL #5   Title Pt will decrease RUE fascial restrictions to minimal amounts or less to improve ability to perform functional reaching tasks at home and work.    Time 4    Period Weeks    Status New                      Plan - 06/09/21 0937     Clinical Impression Statement A: Pt is a 45 y/o female presenting with right shoulder pain due to RC tear. Pt is having significant difficulty with ADL and work task completion due to limited mobility and increased pain of RUE. Pt with limited number of visits due  to insurance and is agreeable to trial 4 weeks of therapy and will then follow up with MD if pain and function are not improving.    OT Occupational Profile and History Problem Focused Assessment - Including review of records relating to presenting problem    Occupational performance deficits (Please refer to evaluation for details): ADL's;IADL's;Rest and Sleep;Leisure;Work    Games developer / Function / Physical Skills ADL;Endurance;UE functional use;Fascial restriction;Pain;ROM;IADL;Strength    Rehab Potential Good    Clinical Decision Making Limited treatment options, no task modification necessary    Comorbidities Affecting Occupational Performance: None    Modification or Assistance to Complete Evaluation  No modification of tasks or assist necessary to complete eval    OT Frequency 2x / week    OT Duration 4 weeks    OT Treatment/Interventions Self-care/ADL training;Ultrasound;Patient/family education;Passive range of motion;Cryotherapy;Electrical Stimulation;Moist Heat;Therapeutic exercise;Manual Therapy;Therapeutic activities    Plan P: Pt will benefit from skilled OT services to decrease pain and fascial restrictions, increase RUE ROM, strength, and functional use during ADLs and work tasks. Treatment plan: myofascial release, manual techniques, P/ROM, AA/ROM,  A/ROM, general RUE strengthening, modalities prn    OT Home Exercise Plan 9/15: shoulder AA/ROM    Consulted and Agree with Plan of Care Patient             Patient will benefit from skilled therapeutic intervention in order to improve the following deficits and impairments:   Body Structure / Function / Physical Skills: ADL, Endurance, UE functional use, Fascial restriction, Pain, ROM, IADL, Strength       Visit Diagnosis: Chronic right shoulder pain  Other symptoms and signs involving the musculoskeletal system    Problem List Patient Active Problem List   Diagnosis Date Noted   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    Fatty liver 09/30/2020   IBS (irritable bowel syndrome) 09/30/2020   Chronic cholecystitis    Radiculopathy due to lumbar intervertebral disc disorder 10/16/2019   Rectal leakage 06/17/2019   Abdominal pain 09/03/2018   Rectal pain 09/03/2018   Mild intermittent asthma, uncomplicated 04/24/2018   Adverse food reaction 04/24/2018   Perennial allergic rhinitis 04/24/2018   Allergic urticaria 04/24/2018   Gastroesophageal reflux disease    Hiatal hernia    Hematometra 10/02/2017   Pelvic pain in female 10/02/2017   Status post vaginal hysterectomy 10/02/2017   Rectal bleeding 07/26/2017   Rectocele, female 06/28/2017   Uterine prolapse 06/28/2017   Diarrhea 04/26/2017   Chronic bilateral low back pain without sciatica 02/15/2017   Dyspepsia 12/21/2016   Dysphagia 12/21/2016   Nausea without vomiting 12/21/2016   Chronic migraine 07/20/2016   Neck pain 07/20/2016   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 [place], [place], [address] Phone: [phone]   Fax:  [phone]  Name: Ruth Shah MRN: [id number] Date of Birth: 1976-05-06

## 2021-06-21 ENCOUNTER — Encounter (HOSPITAL_COMMUNITY): Payer: Self-pay

## 2021-06-21 ENCOUNTER — Other Ambulatory Visit: Payer: Self-pay

## 2021-06-21 ENCOUNTER — Ambulatory Visit (HOSPITAL_COMMUNITY): Payer: Medicaid Other

## 2021-06-21 DIAGNOSIS — R29898 Other symptoms and signs involving the musculoskeletal system: Secondary | ICD-10-CM

## 2021-06-21 DIAGNOSIS — G8929 Other chronic pain: Secondary | ICD-10-CM

## 2021-06-21 DIAGNOSIS — M25511 Pain in right shoulder: Secondary | ICD-10-CM | POA: Diagnosis not present

## 2021-06-21 NOTE — Therapy (Signed)
[facility] [address] [place], [place], [address] Phone: [phone]   Fax:  [phone]  Occupational Therapy Treatment  Patient Details  Name: Ruth Shah MRN: [id number] Date of Birth: 1976/01/17 Referring Provider (OT): Dr. Duwayne Heck   Encounter Date: 06/21/2021   OT End of Session - 06/21/21 1618     Visit Number 2    Number of Visits 8    Date for OT Re-Evaluation 07/09/21    Authorization Type [employer]    Authorization Time Period Approved 14 visits (06/10/21-08/09/21)    Authorization - Visit Number 1    Authorization - Number of Visits 14    OT Start Time 1515    OT Stop Time 1553    OT Time Calculation (min) 38 min    Activity Tolerance Patient tolerated treatment well    Behavior During Therapy WFL for tasks assessed/performed             Past Medical History:  Diagnosis Date   ADD (attention deficit disorder)    Arthritis    Asthma    mild seasonal reactions to mold and mildew   Back pain    Depression    Fibromyalgia    GERD (gastroesophageal reflux disease)    Herpes simplex disease    History of kidney stones    Hypertension    Migraines    Paresthesia of both hands    PONV (postoperative nausea and vomiting)    Urticaria     Past Surgical History:  Procedure Laterality Date   BIOPSY  09/23/2019   Procedure: BIOPSY;  Surgeon: West Bali, MD;  [facility];  Service: Endoscopy;;  esophagus   CHOLECYSTECTOMY N/A 04/08/2020   Procedure: LAPAROSCOPIC CHOLECYSTECTOMY;  Surgeon: Franky Macho, MD;  [facility]: [facility];  Service: General;  Laterality: N/A;   ENDOMETRIAL ABLATION     ESOPHAGEAL MANOMETRY N/A 10/15/2017   Procedure: ESOPHAGEAL MANOMETRY (EM);  Surgeon: Napoleon Form, MD;  [facility];  Service: Endoscopy;  Laterality: N/A;   ESOPHAGOGASTRODUODENOSCOPY (EGD) WITH PROPOFOL N/A 12/26/2016   Dr. Darrick Penna: one benign-appearing, intrinsic stenosis that  was widely patent and traversed. Small hiatal hernia, few small sessile polyps. Chronic gastritis. Normal duodenum, negative celiac sprue.    ESOPHAGOGASTRODUODENOSCOPY (EGD) WITH PROPOFOL N/A 09/23/2019   Procedure: ESOPHAGOGASTRODUODENOSCOPY (EGD) WITH PROPOFOL;  Surgeon: West Bali, MD;  [facility];  Service: Endoscopy;  Laterality: N/A;  11:00am   HERNIA REPAIR     inguinal as a baby    left ovarian removal due to cyst     PILONIDAL CYST / SINUS EXCISION  2009   RECTOCELE REPAIR N/A 10/02/2017   Procedure: POSTERIOR REPAIR (RECTOCELE);  Surgeon: Tilda Burrow, MD;  [facility]: [facility];  Service: Gynecology;  Laterality: N/A;   SAVORY DILATION N/A 09/23/2019   Procedure: SAVORY DILATION;  Surgeon: West Bali, MD;  [facility];  Service: Endoscopy;  Laterality: N/A;   SINOSCOPY     TUBAL LIGATION     VAGINAL HYSTERECTOMY N/A 10/02/2017   Procedure: HYSTERECTOMY VAGINAL;  Surgeon: Tilda Burrow, MD;  Location: [facility];  Service: Gynecology;  Laterality: N/A;    There were no vitals filed for this visit.   Subjective Assessment - 06/21/21 1517     Subjective  S: I've noticed my left arm is starting to hurt the same way as the right is.    Currently in Pain? Yes  Pain Score 4     Pain Location Shoulder    Pain Orientation Right    Pain Descriptors / Indicators Aching;Sore    Pain Type Chronic pain    Pain Onset More than a month ago    Pain Frequency Intermittent    Aggravating Factors  work, use, overhead reaching, certain movements.    Pain Relieving Factors OTC pain medication    Effect of Pain on Daily Activities mod to max    Multiple Pain Sites No                [facility] OT Assessment - 06/21/21 1532       Assessment   Medical Diagnosis right shoulder pain      Precautions   Precautions None                      OT Treatments/Exercises (OP) - 06/21/21 1532       Exercises   Exercises Shoulder      Shoulder  Exercises: Supine   Protraction PROM;5 reps    Horizontal ABduction PROM;5 reps    External Rotation PROM;5 reps    Internal Rotation PROM;5 reps    Flexion PROM;5 reps    ABduction PROM;5 reps    Other Supine Exercises Serratus anterior punch; A/ROM. 12X      Shoulder Exercises: Standing   Protraction AAROM;10 reps    Horizontal ABduction AAROM;10 reps    External Rotation AAROM;10 reps    Internal Rotation AAROM;10 reps    Flexion AAROM;10 reps    ABduction AAROM;10 reps    Extension Theraband;12 reps    Theraband Level (Shoulder Extension) Level 2 (Red)    Row Theraband;12 reps    Theraband Level (Shoulder Row) Level 2 (Red)      Shoulder Exercises: ROM/Strengthening   UBE (Upper Arm Bike) Level 2 reverse only 3'   Pace: 6.0   Proximal Shoulder Strengthening, Supine A/ROM 10X no rest breaks      Manual Therapy   Manual Therapy Myofascial release    Manual therapy comments Manual therapy completed prior exercises.    Myofascial Release Myofascial release and manual stretching completed to right upper arm, upper trapezius, and scapularis region to decrease fascial restrictions and increase joint mobility in a pain free zone.                      OT Short Term Goals - 06/21/21 1519       OT SHORT TERM GOAL #1   Title Pt will be provided with and educated on HEP to improve RUE mobility required for ADL completion.    Time 4    Period Weeks    Status On-going    Target Date 07/09/21      OT SHORT TERM GOAL #2   Title Pt will decrease pain in RUE to 4/10 or less to improve ability to sleep for 3+ consecutive hours without waking due to pain.    Time 4    Period Weeks    Status On-going      OT SHORT TERM GOAL #3   Title Pt will increase RUE A/ROM to [facility] to improve ability to perform work tasks such as reaching up and over dogs and adjusting grooming noose.    Time 4    Period Weeks    Status On-going      OT SHORT TERM GOAL #4   Title Pt will improve  RUE  strength to 5/5 throughout to improve ability to lift heavy dogs at work.    Time 4    Period Weeks    Status On-going      OT SHORT TERM GOAL #5   Title Pt will decrease RUE fascial restrictions to minimal amounts or less to improve ability to perform functional reaching tasks at home and work.    Time 4    Period Weeks    Status On-going                      Plan - 06/21/21 1619     Clinical Impression Statement A: Initiated myofascial release to the right upper arm, and upper trapezius region to address moderate fascial restrictions. Focused on scapular mobility and strengthening during session. Modified any exercises to remain within patient's pain tolerance. VC for form and technique were provided. Recommended ice after session for pain management.    Body Structure / Function / Physical Skills ADL;Endurance;UE functional use;Fascial restriction;Pain;ROM;IADL;Strength    Plan P: Complete A/ROM supine if able to tolerate. Add scapular retraction with theraband.    Consulted and Agree with Plan of Care Patient             Patient will benefit from skilled therapeutic intervention in order to improve the following deficits and impairments:   Body Structure / Function / Physical Skills: ADL, Endurance, UE functional use, Fascial restriction, Pain, ROM, IADL, Strength       Visit Diagnosis: Other symptoms and signs involving the musculoskeletal system  Chronic right shoulder pain    Problem List Patient Active Problem List   Diagnosis Date Noted   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   Fatty liver 09/30/2020   IBS (irritable bowel syndrome) 09/30/2020   Chronic cholecystitis    Radiculopathy due to lumbar intervertebral disc disorder 10/16/2019   Rectal leakage 06/17/2019   Abdominal pain 09/03/2018   Rectal pain 09/03/2018   Mild intermittent asthma, uncomplicated 04/24/2018   Adverse food reaction 04/24/2018   Perennial allergic rhinitis 04/24/2018    Allergic urticaria 04/24/2018   Gastroesophageal reflux disease    Hiatal hernia    Hematometra 10/02/2017   Pelvic pain in female 10/02/2017   Status post vaginal hysterectomy 10/02/2017   Rectal bleeding 07/26/2017   Rectocele, female 06/28/2017   Uterine prolapse 06/28/2017   Diarrhea 04/26/2017   Chronic bilateral low back pain without sciatica 02/15/2017   Dyspepsia 12/21/2016   Dysphagia 12/21/2016   Nausea without vomiting 12/21/2016   Chronic migraine 07/20/2016   Neck pain 07/20/2016   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

## 2021-06-23 ENCOUNTER — Encounter (HOSPITAL_COMMUNITY): Payer: Self-pay

## 2021-06-23 ENCOUNTER — Ambulatory Visit (HOSPITAL_COMMUNITY): Payer: Medicaid Other

## 2021-06-23 ENCOUNTER — Other Ambulatory Visit: Payer: Self-pay

## 2021-06-23 DIAGNOSIS — M25511 Pain in right shoulder: Secondary | ICD-10-CM | POA: Diagnosis not present

## 2021-06-23 DIAGNOSIS — R29898 Other symptoms and signs involving the musculoskeletal system: Secondary | ICD-10-CM

## 2021-06-23 DIAGNOSIS — G8929 Other chronic pain: Secondary | ICD-10-CM

## 2021-06-23 NOTE — Patient Instructions (Addendum)
Repeat all exercises 10-15 times, 1-2 times per day.  1) Shoulder Protraction    Begin with elbows by your side, slowly "punch" straight out in front of you.      2) Shoulder Flexion  Standing:         Begin with arms at your side with thumbs pointed up, slowly raise both arms up and forward towards overhead.       3) Horizontal abduction/adduction   Standing:           Begin with arms straight out in front of you, bring out to the side in at "T" shape. Keep arms straight entire time.             4) Internal & External Rotation   Standing:     Stand with elbows at the side and elbows bent 90 degrees. Move your forearms away from your body, then bring back inward toward the body.     5) Shoulder Abduction   Standing:       Begin with your arms flat next to your side. Slowly move your arms out to the side so that they go overhead, in a jumping jack or snow angel movement.       

## 2021-06-26 NOTE — Therapy (Signed)
[facility] [address] [place], [place], [address] Phone: [phone]   Fax:  [phone]  Occupational Therapy Treatment  Patient Details  Name: Ruth Shah MRN: [id number] Date of Birth: [birth year] Referring Provider (OT): Dr. Duwayne Heck   Encounter Date: 06/23/2021    06/23/21 1638  OT Visits / Re-Eval  Visit Number 3  Number of Visits 8  Date for OT Re-Evaluation 07/09/21  Authorization  Authorization Type [employer]  Authorization Time Period Approved 14 visits (06/10/21-08/09/21)  Authorization - Visit Number 2  Authorization - Number of Visits 14  OT Time Calculation  OT Start Time 1600  OT Stop Time 1644  OT Time Calculation (min) 44 min  End of Session  Activity Tolerance Patient tolerated treatment well  Behavior During Therapy WFL for tasks assessed/performed    Past Medical History:  Diagnosis Date   ADD (attention deficit disorder)    Arthritis    Asthma    mild seasonal reactions to mold and mildew   Back pain    Depression    Fibromyalgia    GERD (gastroesophageal reflux disease)    Herpes simplex disease    History of kidney stones    Hypertension    Migraines    Paresthesia of both hands    PONV (postoperative nausea and vomiting)    Urticaria     Past Surgical History:  Procedure Laterality Date   BIOPSY  09/23/2019   Procedure: BIOPSY;  Surgeon: West Bali, MD;  [facility];  Service: Endoscopy;;  esophagus   CHOLECYSTECTOMY N/A 04/08/2020   Procedure: LAPAROSCOPIC CHOLECYSTECTOMY;  Surgeon: Franky Macho, MD;  Location: [facility];  Service: General;  Laterality: N/A;   ENDOMETRIAL ABLATION     ESOPHAGEAL MANOMETRY N/A 10/15/2017   Procedure: ESOPHAGEAL MANOMETRY (EM);  Surgeon: Napoleon Form, MD;  [facility];  Service: Endoscopy;  Laterality: N/A;   ESOPHAGOGASTRODUODENOSCOPY (EGD) WITH PROPOFOL N/A 12/26/2016   Dr. Darrick Penna: one benign-appearing,  intrinsic stenosis that was widely patent and traversed. Small hiatal hernia, few small sessile polyps. Chronic gastritis. Normal duodenum, negative celiac sprue.    ESOPHAGOGASTRODUODENOSCOPY (EGD) WITH PROPOFOL N/A 09/23/2019   Procedure: ESOPHAGOGASTRODUODENOSCOPY (EGD) WITH PROPOFOL;  Surgeon: West Bali, MD;  [facility];  Service: Endoscopy;  Laterality: N/A;  11:00am   HERNIA REPAIR     inguinal as a baby    left ovarian removal due to cyst     PILONIDAL CYST / SINUS EXCISION  2009   RECTOCELE REPAIR N/A 10/02/2017   Procedure: POSTERIOR REPAIR (RECTOCELE);  Surgeon: Tilda Burrow, MD;  Location: [facility];  Service: Gynecology;  Laterality: N/A;   SAVORY DILATION N/A 09/23/2019   Procedure: SAVORY DILATION;  Surgeon: West Bali, MD;  [facility];  Service: Endoscopy;  Laterality: N/A;   SINOSCOPY     TUBAL LIGATION     VAGINAL HYSTERECTOMY N/A 10/02/2017   Procedure: HYSTERECTOMY VAGINAL;  Surgeon: Tilda Burrow, MD;  Location: [facility];  Service: Gynecology;  Laterality: N/A;    There were no vitals filed for this visit.    06/23/21 1604  Symptoms/Limitations  Subjective  S: I was a little sore after last session but it was fine the next morning.  Pain Assessment  Currently in Pain? No/denies      06/23/21 1620  Assessment  Medical Diagnosis right shoulder pain  Precautions  Precautions None     06/23/21 1620  Exercises  Exercises Shoulder  Shoulder Exercises: Supine  Protraction PROM;5 reps;AROM;10 reps  Horizontal ABduction PROM;5 reps;AROM;10 reps  External Rotation PROM;5 reps;AROM;10 reps  Internal Rotation PROM;5 reps;AROM;10 reps  Flexion PROM;5 reps;AROM;10 reps  ABduction PROM;5 reps;AROM;10 reps  Shoulder Exercises: Standing  Protraction AROM;10 reps  Horizontal ABduction AROM;10 reps  External Rotation AROM;10 reps  Internal Rotation AROM;10 reps  Flexion AROM;10 reps  ABduction AROM;10 reps  Shoulder Exercises:  ROM/Strengthening  UBE (Upper Arm Bike) Level 2 reverse only 3' (pace: 6.0)  Other ROM/Strengthening Exercises Proximal shoulder strengthening with washcloth 1' flexion 1' abduction  Manual Therapy  Manual Therapy Myofascial release  Manual therapy comments Manual therapy completed prior exercises.  Myofascial Release Myofascial release and manual stretching completed to right upper arm, upper trapezius, and scapularis region to decrease fascial restrictions and increase joint mobility in a pain free zone.                        OT Education - 06/26/21 1434     Education Details standing A/ROM    Person(s) Educated Patient    Methods Explanation;Demonstration;Handout;Verbal cues    Comprehension Verbalized understanding;Returned demonstration              OT Short Term Goals - 06/21/21 1519       OT SHORT TERM GOAL #1   Title Pt will be provided with and educated on HEP to improve RUE mobility required for ADL completion.    Time 4    Period Weeks    Status On-going    Target Date 07/09/21      OT SHORT TERM GOAL #2   Title Pt will decrease pain in RUE to 4/10 or less to improve ability to sleep for 3+ consecutive hours without waking due to pain.    Time 4    Period Weeks    Status On-going      OT SHORT TERM GOAL #3   Title Pt will increase RUE A/ROM to [facility] to improve ability to perform work tasks such as reaching up and over dogs and adjusting grooming noose.    Time 4    Period Weeks    Status On-going      OT SHORT TERM GOAL #4   Title Pt will improve RUE strength to 5/5 throughout to improve ability to lift heavy dogs at work.    Time 4    Period Weeks    Status On-going      OT SHORT TERM GOAL #5   Title Pt will decrease RUE fascial restrictions to minimal amounts or less to improve ability to perform functional reaching tasks at home and work.    Time 4    Period Weeks    Status On-going                     06/23/21 1639   OT Assessment and Plan  Clinical Impression Statement A: Myofascial release completed to ro right upper arm and upper trapeziud to address fascial restrictions. Completed supine and standing A/ROM exercises. VC for form and technique were provided. HEP was updated.  Pt will benefit from skilled therapeutic intervention in order to improve on the following performance deficits Body Structure / Function / Physical Skills  Body Structure / Function / Physical Skills ADL;Endurance;UE functional use;Fascial restriction;Pain;ROM;IADL;Strength  Plan P: Send updated measurements to MD (appointment is 10/13). D/C passive stretching.  OT Home Exercise Plan 9/15: shoulder AA/ROM  9/29: A/ROM  Consulted and Agree with Plan of Care Patient      Patient will benefit from skilled therapeutic intervention in order to improve the following deficits and impairments:   Body Structure / Function / Physical Skills: ADL, Endurance, UE functional use, Fascial restriction, Pain, ROM, IADL, Strength       Visit Diagnosis: Other symptoms and signs involving the musculoskeletal system  Chronic right shoulder pain    Problem List Patient Active Problem List   Diagnosis Date Noted   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   Fatty liver 09/30/2020   IBS (irritable bowel syndrome) 09/30/2020   Chronic cholecystitis    Radiculopathy due to lumbar intervertebral disc disorder 10/16/2019   Rectal leakage 06/17/2019   Abdominal pain 09/03/2018   Rectal pain 09/03/2018   Mild intermittent asthma, uncomplicated 04/24/2018   Adverse food reaction 04/24/2018   Perennial allergic rhinitis 04/24/2018   Allergic urticaria 04/24/2018   Gastroesophageal reflux disease    Hiatal hernia    Hematometra 10/02/2017   Pelvic pain in female 10/02/2017   Status post vaginal hysterectomy 10/02/2017   Rectal bleeding 07/26/2017   Rectocele, female 06/28/2017   Uterine prolapse 06/28/2017   Diarrhea 04/26/2017   Chronic bilateral low  back pain without sciatica 02/15/2017   Dyspepsia 12/21/2016   Dysphagia 12/21/2016   Nausea without vomiting 12/21/2016   Chronic migraine 07/20/2016   Neck pain 07/20/2016   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

## 2021-06-30 ENCOUNTER — Encounter (HOSPITAL_COMMUNITY): Payer: Medicaid Other | Admitting: Occupational Therapy

## 2021-07-05 ENCOUNTER — Encounter (HOSPITAL_COMMUNITY): Payer: Medicaid Other

## 2021-07-06 ENCOUNTER — Other Ambulatory Visit: Payer: Self-pay

## 2021-07-06 ENCOUNTER — Ambulatory Visit (HOSPITAL_COMMUNITY): Payer: Medicaid Other | Attending: Orthopedic Surgery | Admitting: Occupational Therapy

## 2021-07-06 ENCOUNTER — Encounter (HOSPITAL_COMMUNITY): Payer: Self-pay | Admitting: Occupational Therapy

## 2021-07-06 DIAGNOSIS — R29898 Other symptoms and signs involving the musculoskeletal system: Secondary | ICD-10-CM | POA: Insufficient documentation

## 2021-07-06 DIAGNOSIS — G8929 Other chronic pain: Secondary | ICD-10-CM | POA: Insufficient documentation

## 2021-07-06 DIAGNOSIS — M25511 Pain in right shoulder: Secondary | ICD-10-CM | POA: Diagnosis present

## 2021-07-06 NOTE — Therapy (Signed)
[facility] [address] [place], [place], [address] Phone: [phone]   Fax:  [phone]  Occupational Therapy Treatment  Patient Details  Name: Ruth Shah MRN: [id number] Date of Birth: Apr 08, 1976 Referring Provider (OT): Dr. Duwayne Heck   Encounter Date: 07/06/2021   OT End of Session - 07/06/21 1553     Visit Number 4    Number of Visits 8    Date for OT Re-Evaluation 07/09/21    Authorization Type [employer]    Authorization Time Period Approved 14 visits (06/10/21-08/09/21)    Authorization - Visit Number 3    Authorization - Number of Visits 14    OT Start Time 1509    OT Stop Time 1548    OT Time Calculation (min) 39 min    Activity Tolerance Patient tolerated treatment well    Behavior During Therapy WFL for tasks assessed/performed             Past Medical History:  Diagnosis Date   ADD (attention deficit disorder)    Arthritis    Asthma    mild seasonal reactions to mold and mildew   Back pain    Depression    Fibromyalgia    GERD (gastroesophageal reflux disease)    Herpes simplex disease    History of kidney stones    Hypertension    Migraines    Paresthesia of both hands    PONV (postoperative nausea and vomiting)    Urticaria     Past Surgical History:  Procedure Laterality Date   BIOPSY  09/23/2019   Procedure: BIOPSY;  Surgeon: West Bali, MD;  [facility];  Service: Endoscopy;;  esophagus   CHOLECYSTECTOMY N/A 04/08/2020   Procedure: LAPAROSCOPIC CHOLECYSTECTOMY;  Surgeon: Franky Macho, MD;  Location: [facility];  Service: General;  Laterality: N/A;   ENDOMETRIAL ABLATION     ESOPHAGEAL MANOMETRY N/A 10/15/2017   Procedure: ESOPHAGEAL MANOMETRY (EM);  Surgeon: Napoleon Form, MD;  [facility];  Service: Endoscopy;  Laterality: N/A;   ESOPHAGOGASTRODUODENOSCOPY (EGD) WITH PROPOFOL N/A 12/26/2016   Dr. Darrick Penna: one benign-appearing, intrinsic stenosis that  was widely patent and traversed. Small hiatal hernia, few small sessile polyps. Chronic gastritis. Normal duodenum, negative celiac sprue.    ESOPHAGOGASTRODUODENOSCOPY (EGD) WITH PROPOFOL N/A 09/23/2019   Procedure: ESOPHAGOGASTRODUODENOSCOPY (EGD) WITH PROPOFOL;  Surgeon: West Bali, MD;  [facility];  Service: Endoscopy;  Laterality: N/A;  11:00am   HERNIA REPAIR     inguinal as a baby    left ovarian removal due to cyst     PILONIDAL CYST / SINUS EXCISION  2009   RECTOCELE REPAIR N/A 10/02/2017   Procedure: POSTERIOR REPAIR (RECTOCELE);  Surgeon: Tilda Burrow, MD;  [facility]: [facility];  Service: Gynecology;  Laterality: N/A;   SAVORY DILATION N/A 09/23/2019   Procedure: SAVORY DILATION;  Surgeon: West Bali, MD;  [facility];  Service: Endoscopy;  Laterality: N/A;   SINOSCOPY     TUBAL LIGATION     VAGINAL HYSTERECTOMY N/A 10/02/2017   Procedure: HYSTERECTOMY VAGINAL;  Surgeon: Tilda Burrow, MD;  Location: [facility];  Service: Gynecology;  Laterality: N/A;    There were no vitals filed for this visit.   Subjective Assessment - 07/06/21 1510     Subjective  S: I rejoined the fire department.    Currently in Pain? Yes    Pain Score 4     Pain Location  Shoulder    Pain Orientation Right    Pain Descriptors / Indicators Aching;Sore    Pain Type Chronic pain    Pain Radiating Towards none    Pain Onset More than a month ago    Pain Frequency Intermittent    Aggravating Factors  work, use, overhead reaching, certain movements    Pain Relieving Factors OTC pain medication    Effect of Pain on Daily Activities mod effect    Multiple Pain Sites No                [facility] OT Assessment - 07/06/21 1510       Assessment   Medical Diagnosis right shoulder pain      Precautions   Precautions None      Palpation   Palpation comment min-mod fascial restrictions in right upper arm, anterior shoulder, and trapezius region      AROM   Overall AROM  Comments Assessed seated, er/IR adducted    AROM Assessment Site Shoulder    Right/Left Shoulder Right    Right Shoulder Flexion 165 Degrees   90 previous   Right Shoulder ABduction 175 Degrees   130 previous   Right Shoulder Internal Rotation 90 Degrees   same as previous   Right Shoulder External Rotation 65 Degrees   same as previous     PROM   Overall PROM Comments Assessed supine, er/IR adducted    PROM Assessment Site Shoulder    Right/Left Shoulder Right    Right Shoulder Flexion 166 Degrees   145 previous   Right Shoulder ABduction 160 Degrees   147 previous   Right Shoulder Internal Rotation 90 Degrees   same as previous   Right Shoulder External Rotation 75 Degrees   38 previous     Strength   Overall Strength Comments Assessed seated, er/IR adducted    Strength Assessment Site Shoulder    Right/Left Shoulder Right    Right Shoulder Flexion 4+/5   4/5 previous   Right Shoulder ABduction 4+/5   same as previous   Right Shoulder Internal Rotation 5/5   4+/5 previous   Right Shoulder External Rotation 4+/5   4/5 previous                     OT Treatments/Exercises (OP) - 07/06/21 1511       Exercises   Exercises Shoulder      Shoulder Exercises: Supine   Protraction PROM;5 reps    Horizontal ABduction PROM;5 reps    External Rotation PROM;5 reps    Internal Rotation PROM;5 reps    Flexion PROM;5 reps    ABduction PROM;5 reps      Shoulder Exercises: Standing   Protraction AROM;10 reps    Horizontal ABduction AROM;10 reps    External Rotation AROM;10 reps    Internal Rotation AROM;10 reps    Flexion AROM;10 reps    ABduction AROM;10 reps      Shoulder Exercises: ROM/Strengthening   UBE (Upper Arm Bike) Level 2 3' foward 3' reverse, pace: 6.0    Proximal Shoulder Strengthening, Seated 10X each, no rest breaks    Ball on Wall 1' flexion 1' abduction      Manual Therapy   Manual Therapy Myofascial release    Manual therapy comments Manual  therapy completed prior exercises.    Myofascial Release Myofascial release and manual stretching completed to right upper arm, upper trapezius, and scapularis region to decrease fascial restrictions and increase joint  mobility in a pain free zone.                      OT Short Term Goals - 06/21/21 1519       OT SHORT TERM GOAL #1   Title Pt will be provided with and educated on HEP to improve RUE mobility required for ADL completion.    Time 4    Period Weeks    Status On-going    Target Date 07/09/21      OT SHORT TERM GOAL #2   Title Pt will decrease pain in RUE to 4/10 or less to improve ability to sleep for 3+ consecutive hours without waking due to pain.    Time 4    Period Weeks    Status On-going      OT SHORT TERM GOAL #3   Title Pt will increase RUE A/ROM to [facility] to improve ability to perform work tasks such as reaching up and over dogs and adjusting grooming noose.    Time 4    Period Weeks    Status On-going      OT SHORT TERM GOAL #4   Title Pt will improve RUE strength to 5/5 throughout to improve ability to lift heavy dogs at work.    Time 4    Period Weeks    Status On-going      OT SHORT TERM GOAL #5   Title Pt will decrease RUE fascial restrictions to minimal amounts or less to improve ability to perform functional reaching tasks at home and work.    Time 4    Period Weeks    Status On-going                      Plan - 07/06/21 1531     Clinical Impression Statement A: Pt reports she is completing HEP and doing shoulder stretches at work when her shoulder is bothering her. Continued with myofascial release to address fascial restrictions, passive stretching. Measurements taken for MD appt tomorrow. Pt completing standing A/ROM, scapular theraband. Added ball on wall and continued UBE. Verbal cuing for form and technique.    Body Structure / Function / Physical Skills ADL;Endurance;UE functional use;Fascial  restriction;Pain;ROM;IADL;Strength    Plan P: follow up on MD appt, discharge passive stretching, continue with scapular theraband and add to HEP. Proximal shoulder strengthening    OT Home Exercise Plan 9/15: shoulder AA/ROM 9/29: A/ROM    Consulted and Agree with Plan of Care Patient             Patient will benefit from skilled therapeutic intervention in order to improve the following deficits and impairments:   Body Structure / Function / Physical Skills: ADL, Endurance, UE functional use, Fascial restriction, Pain, ROM, IADL, Strength       Visit Diagnosis: Other symptoms and signs involving the musculoskeletal system  Chronic right shoulder pain    Problem List Patient Active Problem List   Diagnosis Date Noted   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   Fatty liver 09/30/2020   IBS (irritable bowel syndrome) 09/30/2020   Chronic cholecystitis    Radiculopathy due to lumbar intervertebral disc disorder 10/16/2019   Rectal leakage 06/17/2019   Abdominal pain 09/03/2018   Rectal pain 09/03/2018   Mild intermittent asthma, uncomplicated 04/24/2018   Adverse food reaction 04/24/2018   Perennial allergic rhinitis 04/24/2018   Allergic urticaria 04/24/2018   Gastroesophageal reflux disease    Hiatal hernia  Hematometra 10/02/2017   Pelvic pain in female 10/02/2017   Status post vaginal hysterectomy 10/02/2017   Rectal bleeding 07/26/2017   Rectocele, female 06/28/2017   Uterine prolapse 06/28/2017   Diarrhea 04/26/2017   Chronic bilateral low back pain without sciatica 02/15/2017   Dyspepsia 12/21/2016   Dysphagia 12/21/2016   Nausea without vomiting 12/21/2016   Chronic migraine 07/20/2016   Neck pain 07/20/2016   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

## 2021-07-07 ENCOUNTER — Encounter (HOSPITAL_COMMUNITY): Payer: Medicaid Other

## 2021-07-07 ENCOUNTER — Ambulatory Visit (HOSPITAL_COMMUNITY): Payer: Medicaid Other | Admitting: Occupational Therapy

## 2021-07-07 ENCOUNTER — Encounter (HOSPITAL_COMMUNITY): Payer: Self-pay | Admitting: Occupational Therapy

## 2021-07-07 DIAGNOSIS — R29898 Other symptoms and signs involving the musculoskeletal system: Secondary | ICD-10-CM | POA: Diagnosis not present

## 2021-07-07 DIAGNOSIS — G8929 Other chronic pain: Secondary | ICD-10-CM

## 2021-07-07 NOTE — Patient Instructions (Signed)
1) (Clinic) Extension / Flexion (Assist)   Face anchor, pull arms back, keeping elbow straight, and squeze shoulder blades together. Repeat 10-15 times. 1-3 times/day.   Copyright  VHI. All rights reserved.   2) (Home) Retraction: Row - Bilateral (Anchor)   Facing anchor, arms reaching forward, pull hands toward stomach, keeping elbows bent and at your sides and pinching shoulder blades together. Repeat 10-15 times. 1-3 times/day.   Copyright  VHI. All rights reserved.   

## 2021-07-07 NOTE — Therapy (Signed)
[place], [place], [address] Phone: [phone]   Fax:  [phone]  Occupational Therapy Treatment & Discharge Summary  Patient Details  Name: Ruth Shah MRN: [id number] Date of Birth: 01/25/1976 Referring Provider (OT): Dr. Victorino December   Encounter Date: 07/07/2021   OT End of Session - 07/07/21 1421     Visit Number 5    Number of Visits 8    Date for OT Re-Evaluation 07/09/21    Authorization Type [employer]    Authorization Time Period Approved 14 visits (06/10/21-08/09/21)    Authorization - Visit Number 4    Authorization - Number of Visits 14    OT Start Time 1302    OT Stop Time 1331    OT Time Calculation (min) 29 min    Activity Tolerance Patient tolerated treatment well    Behavior During Therapy WFL for tasks assessed/performed             Past Medical History:  Diagnosis Date   ADD (attention deficit disorder)    Arthritis    Asthma    mild seasonal reactions to mold and mildew   Back pain    Depression    Fibromyalgia    GERD (gastroesophageal reflux disease)    Herpes simplex disease    History of kidney stones    Hypertension    Migraines    Paresthesia of both hands    PONV (postoperative nausea and vomiting)    Urticaria     Past Surgical History:  Procedure Laterality Date   BIOPSY  09/23/2019   Procedure: BIOPSY;  Surgeon: Danie Binder, MD;  [facility];  Service: Endoscopy;;  esophagus   CHOLECYSTECTOMY N/A 04/08/2020   Procedure: LAPAROSCOPIC CHOLECYSTECTOMY;  Surgeon: Aviva Signs, MD;  Location: [facility];  Service: General;  Laterality: N/A;   ENDOMETRIAL ABLATION     ESOPHAGEAL MANOMETRY N/A 10/15/2017   Procedure: ESOPHAGEAL MANOMETRY (EM);  Surgeon: Mauri Pole, MD;  Location: [facility];  Service: Endoscopy;  Laterality: N/A;   ESOPHAGOGASTRODUODENOSCOPY (EGD) WITH PROPOFOL N/A 12/26/2016   Dr. Oneida Alar: one benign-appearing,  intrinsic stenosis that was widely patent and traversed. Small hiatal hernia, few small sessile polyps. Chronic gastritis. Normal duodenum, negative celiac sprue.    ESOPHAGOGASTRODUODENOSCOPY (EGD) WITH PROPOFOL N/A 09/23/2019   Procedure: ESOPHAGOGASTRODUODENOSCOPY (EGD) WITH PROPOFOL;  Surgeon: Danie Binder, MD;  [facility];  Service: Endoscopy;  Laterality: N/A;  11:00am   HERNIA REPAIR     inguinal as a baby    left ovarian removal due to cyst     PILONIDAL CYST / SINUS EXCISION  2009   RECTOCELE REPAIR N/A 10/02/2017   Procedure: POSTERIOR REPAIR (RECTOCELE);  Surgeon: Jonnie Kind, MD;  Location: [facility];  Service: Gynecology;  Laterality: N/A;   SAVORY DILATION N/A 09/23/2019   Procedure: SAVORY DILATION;  Surgeon: Danie Binder, MD;  [facility]: [facility];  Service: Endoscopy;  Laterality: N/A;   SINOSCOPY     TUBAL LIGATION     VAGINAL HYSTERECTOMY N/A 10/02/2017   Procedure: HYSTERECTOMY VAGINAL;  Surgeon: Jonnie Kind, MD;  Location: [facility];  Service: Gynecology;  Laterality: N/A;    There were no vitals filed for this visit.   Subjective Assessment - 07/07/21 1300     Subjective  S: We're going to do surgery.    Currently in Pain? Yes    Pain Score 2  Pain Location Shoulder    Pain Orientation Right    Pain Descriptors / Indicators Aching;Sore    Pain Type Chronic pain    Pain Radiating Towards none    Pain Onset More than a month ago    Pain Frequency Intermittent    Aggravating Factors  work, use, overhead reaching, certain movement    Pain Relieving Factors OTC pain medication    Effect of Pain on Daily Activities mod effect on ADLs    Multiple Pain Sites No                [facility] OT Assessment - 07/07/21 1300       Assessment   Medical Diagnosis right shoulder pain      Precautions   Precautions None                      OT Treatments/Exercises (OP) - 07/07/21 1303       Exercises   Exercises Shoulder       Shoulder Exercises: Supine   Protraction PROM;5 reps    Horizontal ABduction PROM;5 reps    External Rotation PROM;5 reps    Internal Rotation PROM;5 reps    Flexion PROM;5 reps    ABduction PROM;5 reps      Shoulder Exercises: Standing   Protraction AROM;10 reps    Horizontal ABduction AROM;10 reps    External Rotation AROM;10 reps    Internal Rotation AROM;10 reps    Flexion AROM;10 reps    ABduction AROM;10 reps    Extension Theraband;12 reps    Theraband Level (Shoulder Extension) Level 2 (Red)    Row Theraband;12 reps    Theraband Level (Shoulder Row) Level 2 (Red)      Manual Therapy   Manual Therapy Myofascial release    Manual therapy comments Manual therapy completed prior exercises.    Myofascial Release Myofascial release and manual stretching completed to right upper arm, upper trapezius, and scapularis region to decrease fascial restrictions and increase joint mobility in a pain free zone.                    OT Education - 07/07/21 1321     Education Details scapular theraband-red    Person(s) Educated Patient    Methods Explanation;Demonstration;Handout;Verbal cues    Comprehension Verbalized understanding;Returned demonstration              OT Short Term Goals - 07/07/21 1423       OT SHORT TERM GOAL #1   Title Pt will be provided with and educated on HEP to improve RUE mobility required for ADL completion.    Time 4    Period Weeks    Status Achieved    Target Date 07/09/21      OT SHORT TERM GOAL #2   Title Pt will decrease pain in RUE to 4/10 or less to improve ability to sleep for 3+ consecutive hours without waking due to pain.    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    Time 4    Period Weeks    Status Achieved      OT SHORT TERM GOAL #4   Title  Pt will improve RUE  strength to 5/5 throughout to improve ability to lift heavy dogs at work.    Time 4    Period Weeks    Status Partially Met      OT SHORT TERM GOAL #5   Title Pt will decrease RUE fascial restrictions to minimal amounts or less to improve ability to perform functional reaching tasks at home and work.    Time 4    Period Weeks    Status Not Met                      Plan - 07/07/21 1421     Clinical Impression Statement A: Pt reports she saw the MD today and is going to have surgery after Christmas to repair the RC. Discussed maintaining HEP until that time and discharging today, to which pt is agreeable. Pt completing A/ROM and scapular theraband tasks, good form for all exercises. Pt has met 2/5 goals and has partially met 1 additional goal.    Body Structure / Function / Physical Skills ADL;Endurance;UE functional use;Fascial restriction;Pain;ROM;IADL;Strength    Plan P: Discharge pt    OT Home Exercise Plan 9/15: shoulder AA/ROM 9/29: A/ROM; 10/13: red scapular theraband    Consulted and Agree with Plan of Care Patient             Patient will benefit from skilled therapeutic intervention in order to improve the following deficits and impairments:   Body Structure / Function / Physical Skills: ADL, Endurance, UE functional use, Fascial restriction, Pain, ROM, IADL, Strength       Visit Diagnosis: Other symptoms and signs involving the musculoskeletal system  Chronic right shoulder pain    Problem List Patient Active Problem List   Diagnosis Date Noted   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   Fatty liver 09/30/2020   IBS (irritable bowel syndrome) 09/30/2020   Chronic cholecystitis    Radiculopathy due to lumbar intervertebral disc disorder 10/16/2019   Rectal leakage 06/17/2019   Abdominal pain 09/03/2018   Rectal pain 09/03/2018   Mild intermittent asthma, uncomplicated 88/89/1694   Adverse food reaction 04/24/2018   Perennial allergic  rhinitis 04/24/2018   Allergic urticaria 04/24/2018   Gastroesophageal reflux disease    Hiatal hernia    Hematometra 10/02/2017   Pelvic pain in female 10/02/2017   Status post vaginal hysterectomy 10/02/2017   Rectal bleeding 07/26/2017   Rectocele, female 06/28/2017   Uterine prolapse 06/28/2017   Diarrhea 04/26/2017   Chronic bilateral low back pain without sciatica 02/15/2017   Dyspepsia 12/21/2016   Dysphagia 12/21/2016   Nausea without vomiting 12/21/2016   Chronic migraine 07/20/2016   Neck pain 07/20/2016   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 [place], [place], [address] Phone: [phone]   Fax:  [phone]  Name: CLEORA KARNIK MRN: [id number] Date of Birth: Oct 04, 1975  OCCUPATIONAL THERAPY DISCHARGE SUMMARY  Visits from Start of Care: 5  Current functional level related to goals / functional outcomes: Pt now demonstrates A/ROM and strength WFL, is able to complete ADLs and functional tasks using RUE. Pt will return with new order after surgery.    Remaining deficits: Pt continues to have pain with all tasks, increases if completing strenuous tasks.    Education / Equipment: HEP for ROM and strengthening   Patient agrees to discharge. Patient goals were partially met. Patient is  being discharged due to being pleased with the current functional level.

## 2021-07-12 ENCOUNTER — Encounter (HOSPITAL_COMMUNITY): Payer: Medicaid Other

## 2021-07-14 ENCOUNTER — Encounter (HOSPITAL_COMMUNITY): Payer: Medicaid Other | Admitting: Occupational Therapy

## 2021-07-19 ENCOUNTER — Encounter (HOSPITAL_COMMUNITY): Payer: Medicaid Other

## 2021-07-21 ENCOUNTER — Other Ambulatory Visit (HOSPITAL_COMMUNITY): Payer: Self-pay | Admitting: Physician Assistant

## 2021-07-21 ENCOUNTER — Encounter (HOSPITAL_COMMUNITY): Payer: Medicaid Other

## 2021-07-21 ENCOUNTER — Other Ambulatory Visit (HOSPITAL_BASED_OUTPATIENT_CLINIC_OR_DEPARTMENT_OTHER): Payer: Self-pay | Admitting: Physician Assistant

## 2021-07-21 DIAGNOSIS — R1011 Right upper quadrant pain: Secondary | ICD-10-CM

## 2021-08-02 ENCOUNTER — Ambulatory Visit (HOSPITAL_COMMUNITY): Payer: Medicaid Other

## 2021-08-04 ENCOUNTER — Ambulatory Visit (HOSPITAL_COMMUNITY)
Admission: RE | Admit: 2021-08-04 | Discharge: 2021-08-04 | Disposition: A | Discharge status: Home / Self Care | Payer: Medicaid Other | Source: Ambulatory Visit | Attending: Physician Assistant | Admitting: Physician Assistant

## 2021-08-04 ENCOUNTER — Other Ambulatory Visit: Payer: Self-pay

## 2021-08-04 DIAGNOSIS — R1011 Right upper quadrant pain: Secondary | ICD-10-CM | POA: Diagnosis present

## 2021-09-16 NOTE — Pre-Procedure Instructions (Signed)
Surgical Instructions    Your procedure is scheduled on Thursday, December 29th.  Report to [facility] Main Entrance "A" at 5:45 A.M., then check in with the Admitting office.  Call this number if you have problems the morning of surgery:  [phone]   If you have any questions prior to your surgery date call [phone]: Open Monday-Friday 8am-4pm    Remember:  Do not eat after midnight the night before your surgery  You may drink clear liquids until 4:45 a.m. the morning of your surgery.   Clear liquids allowed are: Water, Non-Citrus Juices (without pulp), Carbonated Beverages, Clear Tea, Black Coffee Only, and Gatorade.   Enhanced Recovery after Surgery for Orthopedics Enhanced Recovery after Surgery is a protocol used to improve the stress on your body and your recovery after surgery.  Patient Instructions  The day of surgery (if you do NOT have diabetes):  Drink ONE (1) Pre-Surgery Clear Ensure by 4:45 am the morning of surgery   This drink was given to you during your hospital  pre-op appointment visit. Nothing else to drink after completing the  Pre-Surgery Clear Ensure.         If you have questions, please contact your surgeons office.     Take these medicines the morning of surgery with A SIP OF WATER  pantoprazole (PROTONIX)  As of today, STOP taking any Aspirin (unless otherwise instructed by your surgeon) Aleve, Naproxen, Ibuprofen, Motrin, Advil, Goody's, BC's, all herbal medications, fish oil, and all vitamins.                     Do NOT Smoke (Tobacco/Vaping) or drink Alcohol 24 hours prior to your procedure.  If you use a CPAP at night, you may bring all equipment for your overnight stay.   Contacts, glasses, piercing's, hearing aid's, dentures or partials may not be worn into surgery, please bring cases for these belongings.    For patients admitted to the hospital, discharge time will be determined by your treatment team.   Patients discharged the  day of surgery will not be allowed to drive home, and someone needs to stay with them for 24 hours.  NO VISITORS WILL BE ALLOWED IN PRE-OP WHERE PATIENTS GET READY FOR SURGERY.  ONLY 1 SUPPORT PERSON MAY BE PRESENT IN THE WAITING ROOM WHILE YOU ARE IN SURGERY.  IF YOU ARE TO BE ADMITTED, ONCE YOU ARE IN YOUR ROOM YOU WILL BE ALLOWED TWO (2) VISITORS.  Minor children may have two parents present. Special consideration for safety and communication needs will be reviewed on a case by case basis.   Special instructions:   [facility]- Preparing For Surgery  Before surgery, you can play an important role. Because skin is not sterile, your skin needs to be as free of germs as possible. You can reduce the number of germs on your skin by washing with CHG (chlorahexidine gluconate) Soap before surgery.  CHG is an antiseptic cleaner which kills germs and bonds with the skin to continue killing germs even after washing.    Oral Hygiene is also important to reduce your risk of infection.  Remember - BRUSH YOUR TEETH THE MORNING OF SURGERY WITH YOUR REGULAR TOOTHPASTE  Please do not use if you have an allergy to CHG or antibacterial soaps. If your skin becomes reddened/irritated stop using the CHG.  Do not shave (including legs and underarms) for at least 48 hours prior to first CHG shower. It is OK to shave  your face.  Please follow these instructions carefully.   Shower the NIGHT BEFORE SURGERY and the MORNING OF SURGERY  If you chose to wash your hair, wash your hair first as usual with your normal shampoo.  After you shampoo, rinse your hair and body thoroughly to remove the shampoo.  Use CHG Soap as you would any other liquid soap. You can apply CHG directly to the skin and wash gently with a scrungie or a clean washcloth.   Apply the CHG Soap to your body ONLY FROM THE NECK DOWN.  Do not use on open wounds or open sores. Avoid contact with your eyes, ears, mouth and genitals (private parts). Wash  Face and genitals (private parts)  with your normal soap.   Wash thoroughly, paying special attention to the area where your surgery will be performed.  Thoroughly rinse your body with warm water from the neck down.  DO NOT shower/wash with your normal soap after using and rinsing off the CHG Soap.  Pat yourself dry with a CLEAN TOWEL.  Wear CLEAN PAJAMAS to bed the night before surgery  Place CLEAN SHEETS on your bed the night before your surgery  DO NOT SLEEP WITH PETS.   Day of Surgery: Shower with CHG soap. Do not wear jewelry, make up, nail polish, gel polish, artificial nails, or any other type of covering on natural nails including finger and toenails. If patients have artificial nails, gel coating, etc. that need to be removed by a nail salon please have this removed prior to surgery. Surgery may need to be canceled/delayed if the surgeon/ anesthesia feels like the patient is unable to be adequately monitored. Do not wear lotions, powders, perfumes, or deodorant. Do not shave 48 hours prior to surgery. Do not bring valuables to the hospital. [facility] is not responsible for any belongings or valuables. Wear Clean/Comfortable clothing the morning of surgery Remember to brush your teeth WITH YOUR REGULAR TOOTHPASTE.   Please read over the following fact sheets that you were given.

## 2021-09-20 ENCOUNTER — Encounter (HOSPITAL_COMMUNITY): Payer: Self-pay

## 2021-09-20 ENCOUNTER — Encounter (HOSPITAL_COMMUNITY)
Admission: RE | Admit: 2021-09-20 | Discharge: 2021-09-20 | Disposition: A | Discharge status: Home / Self Care | Payer: Medicaid Other | Source: Ambulatory Visit | Attending: Orthopedic Surgery | Admitting: Orthopedic Surgery

## 2021-09-20 ENCOUNTER — Other Ambulatory Visit: Payer: Self-pay

## 2021-09-20 VITALS — BP 157/101 | HR 76 | Temp 98.5°F | Resp 17 | Ht 63.0 in | Wt 191.0 lb

## 2021-09-20 DIAGNOSIS — Z01818 Encounter for other preprocedural examination: Secondary | ICD-10-CM | POA: Insufficient documentation

## 2021-09-20 DIAGNOSIS — K769 Liver disease, unspecified: Secondary | ICD-10-CM | POA: Diagnosis not present

## 2021-09-20 HISTORY — DX: Family history of other specified conditions: Z84.89

## 2021-09-20 HISTORY — DX: Fatty (change of) liver, not elsewhere classified: K76.0

## 2021-09-20 LAB — COMPREHENSIVE METABOLIC PANEL
ALT: 22 U/L (ref 0–44)
AST: 17 U/L (ref 15–41)
Albumin: 3.7 g/dL (ref 3.5–5.0)
Alkaline Phosphatase: 72 U/L (ref 38–126)
Anion gap: 8 (ref 5–15)
BUN: 13 mg/dL (ref 6–20)
CO2: 27 mmol/L (ref 22–32)
Calcium: 9.2 mg/dL (ref 8.9–10.3)
Chloride: 104 mmol/L (ref 98–111)
Creatinine, Ser: 0.93 mg/dL (ref 0.44–1.00)
GFR, Estimated: >60 mL/min (ref >60)
Glucose, Bld: 104 mg/dL — ABNORMAL HIGH (ref 70–99)
Potassium: 4 mmol/L (ref 3.5–5.1)
Sodium: 139 mmol/L (ref 135–145)
Total Bilirubin: 0.7 mg/dL (ref 0.3–1.2)
Total Protein: 6.7 g/dL (ref 6.5–8.1)

## 2021-09-20 LAB — CBC
HCT: 45.2 % (ref 36.0–46.0)
Hemoglobin: 14.8 g/dL (ref 12.0–15.0)
MCH: 29.4 pg (ref 26.0–34.0)
MCHC: 32.7 g/dL (ref 30.0–36.0)
MCV: 89.7 fL (ref 80.0–100.0)
Platelets: 382 10*3/uL (ref 150–400)
RBC: 5.04 MIL/uL (ref 3.87–5.11)
RDW: 12.5 % (ref 11.5–15.5)
WBC: 8.7 10*3/uL (ref 4.0–10.5)
nRBC: 0 % (ref 0.0–0.2)

## 2021-09-20 NOTE — Progress Notes (Signed)
PCP - Shea Stakes, PA Cardiologist - denies  PPM/ICD - n/a  Chest x-ray - n/a EKG - 09/20/21 Stress Test - denies ECHO - denies Cardiac Cath - denies  Sleep Study - denies. Pt states that her PCP recommends a sleep study but pt has concerns about insurance denying coverage at this time.  CPAP - denies  Blood Thinner Instructions: n/a Aspirin Instructions: n/a  ERAS Protcol -Clear liquids until 0445 DOS. PRE-SURGERY Ensure or G2- (1) Ensure provided.  COVID TEST- Not indicated, ambulatory procedure.   Anesthesia review: No  Patient denies shortness of breath, fever, cough and chest pain at PAT appointment   All instructions explained to the patient, with a verbal understanding of the material. Patient agrees to go over the instructions while at home for a better understanding. Patient also instructed to self quarantine after being tested for COVID-19. The opportunity to ask questions was provided.

## 2021-09-21 NOTE — Anesthesia Preprocedure Evaluation (Addendum)
Anesthesia Evaluation  Patient identified by MRN, date of birth, ID band Patient awake    Reviewed: Allergy & Precautions, NPO status , Patient's Chart, lab work & pertinent test results  History of Anesthesia Complications (+) PONV  Airway Mallampati: II  TM Distance: >3 FB Neck ROM: Full    Dental no notable dental hx. (+) Teeth Intact, Dental Advisory Given   Pulmonary    Pulmonary exam normal breath sounds clear to auscultation       Cardiovascular hypertension, Pt. on medications Normal cardiovascular exam Rhythm:Regular Rate:Normal     Neuro/Psych  Headaches, Depression    GI/Hepatic hiatal hernia, GERD  ,  Endo/Other    Renal/GU      Musculoskeletal  (+) Arthritis , Fibromyalgia -  Abdominal   Peds  Hematology Lab Results      Component                Value               Date                      WBC                      8.7                 09/20/2021                HGB                      14.8                09/20/2021                HCT                      45.2                09/20/2021                MCV                      89.7                09/20/2021                PLT                      382                 09/20/2021              Anesthesia Other Findings All: Dilaudid, vicodin, Duluxetine, Sulfa, ketorolac  Reproductive/Obstetrics                            Anesthesia Physical Anesthesia Plan  ASA: 3  Anesthesia Plan: General and Regional   Post-op Pain Management: Regional block, Tylenol PO (pre-op) and Minimal or no pain anticipated   Induction: Intravenous  PONV Risk Score and Plan: 4 or greater and Treatment may vary due to age or medical condition, Midazolam, Ondansetron, Dexamethasone, Propofol infusion and TIVA  Airway Management Planned: Oral ETT  Additional Equipment: None  Intra-op Plan:   Post-operative Plan: Extubation in OR  Informed  Consent: I have reviewed the patients History and Physical, chart, labs and discussed  the procedure including the risks, benefits and alternatives for the proposed anesthesia with the patient or authorized representative who has indicated his/her understanding and acceptance.     Dental advisory given  Plan Discussed with: CRNA and Anesthesiologist  Anesthesia Plan Comments: (TIVA GA plus dexmetomedine w R ISB exparel)       Anesthesia Quick Evaluation

## 2021-09-22 ENCOUNTER — Encounter (HOSPITAL_COMMUNITY): Payer: Self-pay | Admitting: Orthopedic Surgery

## 2021-09-22 ENCOUNTER — Ambulatory Visit (HOSPITAL_COMMUNITY): Payer: Medicaid Other | Admitting: Anesthesiology

## 2021-09-22 ENCOUNTER — Other Ambulatory Visit: Payer: Self-pay

## 2021-09-22 ENCOUNTER — Ambulatory Visit (HOSPITAL_COMMUNITY)
Admission: RE | Admit: 2021-09-22 | Discharge: 2021-09-22 | Disposition: A | Discharge status: Home / Self Care | Payer: Medicaid Other | Attending: Orthopedic Surgery | Admitting: Orthopedic Surgery

## 2021-09-22 ENCOUNTER — Encounter (HOSPITAL_COMMUNITY)
Admission: RE | Disposition: A | Discharge status: Home / Self Care | Payer: Self-pay | Source: Home / Self Care | Attending: Orthopedic Surgery

## 2021-09-22 DIAGNOSIS — S43491A Other sprain of right shoulder joint, initial encounter: Secondary | ICD-10-CM | POA: Insufficient documentation

## 2021-09-22 DIAGNOSIS — M75111 Incomplete rotator cuff tear or rupture of right shoulder, not specified as traumatic: Secondary | ICD-10-CM | POA: Insufficient documentation

## 2021-09-22 DIAGNOSIS — Z79899 Other long term (current) drug therapy: Secondary | ICD-10-CM | POA: Insufficient documentation

## 2021-09-22 DIAGNOSIS — M7521 Bicipital tendinitis, right shoulder: Secondary | ICD-10-CM | POA: Diagnosis not present

## 2021-09-22 DIAGNOSIS — I10 Essential (primary) hypertension: Secondary | ICD-10-CM | POA: Insufficient documentation

## 2021-09-22 DIAGNOSIS — M7541 Impingement syndrome of right shoulder: Secondary | ICD-10-CM | POA: Diagnosis not present

## 2021-09-22 DIAGNOSIS — Y939 Activity, unspecified: Secondary | ICD-10-CM | POA: Insufficient documentation

## 2021-09-22 DIAGNOSIS — X58XXXA Exposure to other specified factors, initial encounter: Secondary | ICD-10-CM | POA: Insufficient documentation

## 2021-09-22 HISTORY — PX: SHOULDER ARTHROSCOPY WITH ROTATOR CUFF REPAIR: SHX5685

## 2021-09-22 SURGERY — ARTHROSCOPY, SHOULDER, WITH ROTATOR CUFF REPAIR
Anesthesia: Regional | Laterality: Right

## 2021-09-22 MED ORDER — ONDANSETRON HCL 4 MG/2ML IJ SOLN
INTRAMUSCULAR | Status: DC | PRN
  Administered 2021-09-22: 4 mg via INTRAVENOUS

## 2021-09-22 MED ORDER — ROCURONIUM BROMIDE 10 MG/ML (PF) SYRINGE
PREFILLED_SYRINGE | INTRAVENOUS | Status: AC
  Filled 2021-09-22: qty 10

## 2021-09-22 MED ORDER — DEXAMETHASONE SODIUM PHOSPHATE 10 MG/ML IJ SOLN
INTRAMUSCULAR | Status: DC | PRN
  Administered 2021-09-22: 10 mg via INTRAVENOUS

## 2021-09-22 MED ORDER — OXYCODONE HCL 5 MG PO TABS: 5.0000 mg | ORAL_TABLET | Freq: Once | ORAL | Status: DC | PRN

## 2021-09-22 MED ORDER — MIDAZOLAM HCL 2 MG/2ML IJ SOLN
INTRAMUSCULAR | Status: AC
  Administered 2021-09-22: 07:00:00 2 mg via INTRAVENOUS
  Filled 2021-09-22: qty 2

## 2021-09-22 MED ORDER — 0.9 % SODIUM CHLORIDE (POUR BTL) OPTIME
TOPICAL | Status: DC | PRN
  Administered 2021-09-22: 08:00:00 1000 mL

## 2021-09-22 MED ORDER — FENTANYL CITRATE (PF) 100 MCG/2ML IJ SOLN: 100.0000 ug | Freq: Once | INTRAMUSCULAR | Status: AC

## 2021-09-22 MED ORDER — AMISULPRIDE (ANTIEMETIC) 5 MG/2ML IV SOLN: 10.0000 mg | Freq: Once | INTRAVENOUS | Status: DC | PRN

## 2021-09-22 MED ORDER — ROCURONIUM BROMIDE 10 MG/ML (PF) SYRINGE
PREFILLED_SYRINGE | INTRAVENOUS | Status: DC | PRN
  Administered 2021-09-22: 40 mg via INTRAVENOUS
  Administered 2021-09-22: 60 mg via INTRAVENOUS

## 2021-09-22 MED ORDER — EPINEPHRINE PF 1 MG/ML IJ SOLN
INTRAMUSCULAR | Status: AC
  Filled 2021-09-22: qty 1

## 2021-09-22 MED ORDER — DEXAMETHASONE SODIUM PHOSPHATE 10 MG/ML IJ SOLN
INTRAMUSCULAR | Status: AC
  Filled 2021-09-22: qty 1

## 2021-09-22 MED ORDER — ONDANSETRON HCL 4 MG/2ML IJ SOLN
INTRAMUSCULAR | Status: AC
  Filled 2021-09-22: qty 2

## 2021-09-22 MED ORDER — BUPIVACAINE HCL (PF) 0.5 % IJ SOLN
INTRAMUSCULAR | Status: DC | PRN
  Administered 2021-09-22: 16 mL via PERINEURAL

## 2021-09-22 MED ORDER — CHLORHEXIDINE GLUCONATE 0.12 % MT SOLN
15.0000 mL | Freq: Once | OROMUCOSAL | Status: AC
  Administered 2021-09-22: 06:00:00 15 mL via OROMUCOSAL
  Filled 2021-09-22: qty 15

## 2021-09-22 MED ORDER — MIDAZOLAM HCL 2 MG/2ML IJ SOLN: 2.0000 mg | Freq: Once | INTRAMUSCULAR | Status: AC

## 2021-09-22 MED ORDER — ONDANSETRON HCL 4 MG/2ML IJ SOLN: 4.0000 mg | Freq: Once | INTRAMUSCULAR | Status: DC | PRN

## 2021-09-22 MED ORDER — BUPIVACAINE LIPOSOME 1.3 % IJ SUSP
INTRAMUSCULAR | Status: DC | PRN
  Administered 2021-09-22: 10 mL via PERINEURAL

## 2021-09-22 MED ORDER — SUGAMMADEX SODIUM 200 MG/2ML IV SOLN
INTRAVENOUS | Status: DC | PRN
  Administered 2021-09-22: 400 mg via INTRAVENOUS

## 2021-09-22 MED ORDER — LIDOCAINE 2% (20 MG/ML) 5 ML SYRINGE
INTRAMUSCULAR | Status: DC | PRN
  Administered 2021-09-22: 80 mg via INTRAVENOUS

## 2021-09-22 MED ORDER — CEFAZOLIN SODIUM-DEXTROSE 2-4 GM/100ML-% IV SOLN
2.0000 g | INTRAVENOUS | Status: AC
  Administered 2021-09-22: 08:00:00 2 g via INTRAVENOUS
  Filled 2021-09-22: qty 100

## 2021-09-22 MED ORDER — PROPOFOL 10 MG/ML IV BOLUS
INTRAVENOUS | Status: AC
  Filled 2021-09-22: qty 20

## 2021-09-22 MED ORDER — LACTATED RINGERS IV SOLN: INTRAVENOUS | Status: DC

## 2021-09-22 MED ORDER — FENTANYL CITRATE (PF) 100 MCG/2ML IJ SOLN: 25.0000 ug | INTRAMUSCULAR | Status: DC | PRN

## 2021-09-22 MED ORDER — MIDAZOLAM HCL 2 MG/2ML IJ SOLN
INTRAMUSCULAR | Status: AC
  Filled 2021-09-22: qty 2

## 2021-09-22 MED ORDER — FENTANYL CITRATE (PF) 250 MCG/5ML IJ SOLN
INTRAMUSCULAR | Status: AC
  Filled 2021-09-22: qty 5

## 2021-09-22 MED ORDER — SODIUM CHLORIDE 0.9 % IR SOLN
Status: DC | PRN
  Administered 2021-09-22: 3000 mL

## 2021-09-22 MED ORDER — ACETAMINOPHEN 10 MG/ML IV SOLN: 1000.0000 mg | Freq: Once | INTRAVENOUS | Status: DC | PRN

## 2021-09-22 MED ORDER — ORAL CARE MOUTH RINSE: 15.0000 mL | Freq: Once | OROMUCOSAL | Status: AC

## 2021-09-22 MED ORDER — OXYCODONE HCL 5 MG/5ML PO SOLN: 5.0000 mg | Freq: Once | ORAL | Status: DC | PRN

## 2021-09-22 MED ORDER — PROPOFOL 10 MG/ML IV BOLUS
INTRAVENOUS | Status: DC | PRN
  Administered 2021-09-22: 140 mg via INTRAVENOUS

## 2021-09-22 MED ORDER — ONDANSETRON HCL 4 MG/2ML IJ SOLN
4.0000 mg | Freq: Once | INTRAMUSCULAR | Status: AC
  Administered 2021-09-22: 08:00:00 4 mg via INTRAVENOUS

## 2021-09-22 MED ORDER — LIDOCAINE 2% (20 MG/ML) 5 ML SYRINGE
INTRAMUSCULAR | Status: AC
  Filled 2021-09-22: qty 5

## 2021-09-22 MED ORDER — OXYCODONE HCL 5 MG PO TABS: 5.0000 mg | ORAL_TABLET | Freq: Four times a day (QID) | ORAL | 0 refills | Status: DC | PRN

## 2021-09-22 MED ORDER — PROPOFOL 500 MG/50ML IV EMUL
INTRAVENOUS | Status: DC | PRN
  Administered 2021-09-22: 200 ug/kg/min via INTRAVENOUS

## 2021-09-22 MED ORDER — ONDANSETRON 4 MG PO TBDP: 4.0000 mg | ORAL_TABLET | Freq: Three times a day (TID) | ORAL | 0 refills | Status: DC | PRN

## 2021-09-22 MED ORDER — FENTANYL CITRATE (PF) 100 MCG/2ML IJ SOLN
INTRAMUSCULAR | Status: AC
  Administered 2021-09-22: 07:00:00 100 ug via INTRAVENOUS
  Filled 2021-09-22: qty 2

## 2021-09-22 SURGICAL SUPPLY — 52 items
ANCH SUT 2.6 FBRSTK 1.7 (Anchor) IMPLANT
ANCHOR SUT 1.8 FBRTK KNTLS 2SU (Anchor) ×2 IMPLANT
ANCHOR SUT FBRTK 2.6X1.7X2 (Anchor) IMPLANT
BAG COUNTER SPONGE SURGICOUNT (BAG) ×2 IMPLANT
BAG SPNG CNTER NS LX DISP (BAG) ×1
BAG SURGICOUNT SPONGE COUNTING (BAG) ×1
BLADE SURG 11 STRL SS (BLADE) ×3 IMPLANT
BURR OVAL 8 FLU 4.0MM X 13CM (MISCELLANEOUS) ×1
BURR OVAL 8 FLU 4.0X13 (MISCELLANEOUS) ×1 IMPLANT
CANNULA 5.75X7 CRYSTAL CLEAR (CANNULA) ×2 IMPLANT
CANNULA TWIST IN 8.25X7CM (CANNULA) ×3 IMPLANT
CLOSURE WOUND 1/2 X4 (GAUZE/BANDAGES/DRESSINGS) ×1
DRAPE INCISE IOBAN 66X45 STRL (DRAPES) IMPLANT
DRAPE STERI 35X30 U-POUCH (DRAPES) ×3 IMPLANT
DRAPE U-SHAPE 47X51 STRL (DRAPES) ×3 IMPLANT
DRSG PAD ABDOMINAL 8X10 ST (GAUZE/BANDAGES/DRESSINGS) ×9 IMPLANT
DURAPREP 26ML APPLICATOR (WOUND CARE) ×3 IMPLANT
GAUZE SPONGE 4X4 12PLY STRL (GAUZE/BANDAGES/DRESSINGS) ×3 IMPLANT
GLOVE SRG 8 PF TXTR STRL LF DI (GLOVE) ×1 IMPLANT
GLOVE SURG ENC MOIS LTX SZ7.5 (GLOVE) ×6 IMPLANT
GLOVE SURG UNDER POLY LF SZ8 (GLOVE) ×3
GOWN STRL REUS W/ TWL LRG LVL3 (GOWN DISPOSABLE) ×1 IMPLANT
GOWN STRL REUS W/TWL LRG LVL3 (GOWN DISPOSABLE) ×3
GOWN STRL REUS W/TWL XL LVL3 (GOWN DISPOSABLE) ×6 IMPLANT
KIT BASIN OR (CUSTOM PROCEDURE TRAY) ×3 IMPLANT
KIT STR SPEAR 1.8 FBRTK DISP (KITS) ×2 IMPLANT
KIT TURNOVER KIT B (KITS) ×3 IMPLANT
MANIFOLD NEPTUNE II (INSTRUMENTS) ×3 IMPLANT
NDL SCORPION MULTI FIRE (NEEDLE) IMPLANT
NDL SPNL 18GX3.5 QUINCKE PK (NEEDLE) ×1 IMPLANT
NEEDLE SCORPION MULTI FIRE (NEEDLE) IMPLANT
NEEDLE SPNL 18GX3.5 QUINCKE PK (NEEDLE) ×3 IMPLANT
NS IRRIG 1000ML POUR BTL (IV SOLUTION) ×3 IMPLANT
PACK SHOULDER (CUSTOM PROCEDURE TRAY) ×3 IMPLANT
PAD ABD 8X10 STRL (GAUZE/BANDAGES/DRESSINGS) ×6 IMPLANT
PAD ARMBOARD 7.5X6 YLW CONV (MISCELLANEOUS) ×6 IMPLANT
PROBE BIPOLAR ATHRO 135MM 90D (MISCELLANEOUS) ×2 IMPLANT
SLEEVE ARM SUSPENSION SYSTEM (MISCELLANEOUS) ×3 IMPLANT
SLING   SWATHE MEDIUM (SOFTGOODS) ×3
SLING  SWATHE MEDIUM (SOFTGOODS) IMPLANT
SPONGE T-LAP 4X18 ~~LOC~~+RFID (SPONGE) ×3 IMPLANT
STRIP CLOSURE SKIN 1/2X4 (GAUZE/BANDAGES/DRESSINGS) ×1 IMPLANT
SUT ETHILON 3 0 PS 1 (SUTURE) ×1 IMPLANT
SUT MNCRL AB 3-0 PS2 27 (SUTURE) ×2 IMPLANT
SUT TIGER TAPE 7 IN WHITE (SUTURE) IMPLANT
TAPE CLOTH SURG 4X10 WHT LF (GAUZE/BANDAGES/DRESSINGS) ×2 IMPLANT
TAPE FIBER 2MM 7IN #2 BLUE (SUTURE) IMPLANT
TAPE PAPER 3X10 WHT MICROPORE (GAUZE/BANDAGES/DRESSINGS) ×3 IMPLANT
TOWEL GREEN STERILE (TOWEL DISPOSABLE) ×3 IMPLANT
TOWEL GREEN STERILE FF (TOWEL DISPOSABLE) ×3 IMPLANT
TUBING ARTHROSCOPY IRRIG 16FT (MISCELLANEOUS) ×3 IMPLANT
WATER STERILE IRR 1000ML POUR (IV SOLUTION) ×1 IMPLANT

## 2021-09-22 NOTE — Anesthesia Postprocedure Evaluation (Signed)
Anesthesia Post Note  Patient: Ruth Shah  Procedure(s) Performed: SHOULDER ARTHROSCOPY WITH ROTATOR CUFF REPAIR, BICEPS TENOTOMY, SUBACROMIAL DECOMPRESSION, DEBRIDEMENT (Right)     Patient location during evaluation: PACU Anesthesia Type: Regional and General Level of consciousness: awake and alert Pain management: pain level controlled Vital Signs Assessment: post-procedure vital signs reviewed and stable Respiratory status: spontaneous breathing, nonlabored ventilation, respiratory function stable and patient connected to nasal cannula oxygen Cardiovascular status: blood pressure returned to baseline and stable Postop Assessment: no apparent nausea or vomiting Anesthetic complications: no   No notable events documented.  Last Vitals:  Vitals:   09/22/21 0930 09/22/21 0945  BP: (!) 155/86 (!) 155/86  Pulse: 65 (!) 58  Resp: 16 16  Temp:  (!) 36.1 C  SpO2: 99% 100%    Last Pain:  Vitals:   09/22/21 0945  TempSrc:   PainSc: 0-No pain                 Trevor Iha

## 2021-09-22 NOTE — Anesthesia Procedure Notes (Signed)
Procedure Name: Intubation Date/Time: 09/22/2021 8:00 AM Performed by: Rosiland Oz, CRNA Pre-anesthesia Checklist: Patient identified, Emergency Drugs available, Suction available, Patient being monitored and Timeout performed Patient Re-evaluated:Patient Re-evaluated prior to induction Oxygen Delivery Method: Circle system utilized Preoxygenation: Pre-oxygenation with 100% oxygen Induction Type: IV induction Ventilation: Mask ventilation without difficulty Laryngoscope Size: Miller and 3 Grade View: Grade II Tube type: Oral Tube size: 7.0 mm Number of attempts: 1 Airway Equipment and Method: Stylet Placement Confirmation: ETT inserted through vocal cords under direct vision, positive ETCO2 and breath sounds checked- equal and bilateral Secured at: 22 cm Tube secured with: Tape Dental Injury: Teeth and Oropharynx as per pre-operative assessment

## 2021-09-22 NOTE — Discharge Instructions (Addendum)
Orthopedic discharge instructions:  -Maintain postoperative bandages for 3 days.  He may remove these bandages in 3 days and begin showering.  Do not remove the Steri-Strips.  They will stay on your skin until follow-up.  -You may begin showering 3 days postoperatively.  Please do not submerge your incisions underwater.  -You will maintain your arm in the sling at all times until you begin physical therapy.  You may remove the sling to shower and get dressed.  But do not lift anything actively with the right arm.  -While wearing your sling you may use your hand on the right side for activities of daily living such as eating with knife and fork and using her cell phone.  -You should sleep in her sling.  -Apply ice to the shoulder for 20 to 30 minutes out of each hour that you are awake.  Do this as often as possible throughout the day.  -For mild to moderate pain use your baseline pain medication and Tylenol and Advil in alternating fashion.  For breakthrough pain use oxycodone as necessary.  -Return to see Dr. Aundria Rud in 2 weeks.

## 2021-09-22 NOTE — Anesthesia Procedure Notes (Signed)
Anesthesia Regional Block: Interscalene brachial plexus block   Pre-Anesthetic Checklist: , timeout performed,  Correct Patient, Correct Site, Correct Laterality,  Correct Procedure, Correct Position, site marked,  Risks and benefits discussed,  Surgical consent,  Pre-op evaluation,  At surgeon's request and post-op pain management  Laterality: Upper and Right  Prep: Maximum Sterile Barrier Precautions used, chloraprep       Needles:  Injection technique: Single-shot  Needle Type: Echogenic Needle     Needle Length: 5cm  Needle Gauge: 21     Additional Needles:   Procedures:,,,, ultrasound used (permanent image in chart),,    Narrative:  Start time: 09/22/2021 7:15 AM End time: 09/22/2021 7:20 AM Injection made incrementally with aspirations every 5 mL.  Performed by: Personally  Anesthesiologist: Trevor Iha, MD  Additional Notes: Block assessed prior to procedure. Patient tolerated procedure well.

## 2021-09-22 NOTE — H&P (Signed)
ORTHOPAEDIC   REQUESTING PHYSICIAN: Yolonda Kida, MD  PCP:  Selinda Flavin, MD  Chief Complaint: Right shoulder pain  HPI: Ruth Shah is a 45 y.o. female who complains of recalcitrant right shoulder pain.  She is here today for arthroscopic management.  No new complaints at this time.  We have previously discussed moving ahead with surgery on our last appointment in the office.  Past Medical History:  Diagnosis Date   ADD (attention deficit disorder)    Arthritis    Asthma    mild seasonal reactions to mold and mildew   Back pain    Depression    Family history of adverse reaction to anesthesia    mother has severe PONV   Fatty liver    Fibromyalgia    GERD (gastroesophageal reflux disease)    Herpes simplex disease    History of kidney stones    Hypertension    Migraines    Paresthesia of both hands    PONV (postoperative nausea and vomiting)    Urticaria    Past Surgical History:  Procedure Laterality Date   BIOPSY  09/23/2019   Procedure: BIOPSY;  Surgeon: West Bali, MD;  Location: [facility];  Service: Endoscopy;;  esophagus   CHOLECYSTECTOMY N/A 04/08/2020   Procedure: LAPAROSCOPIC CHOLECYSTECTOMY;  Surgeon: Franky Macho, MD;  Location: [facility];  Service: General;  Laterality: N/A;   ENDOMETRIAL ABLATION     ESOPHAGEAL MANOMETRY N/A 10/15/2017   Procedure: ESOPHAGEAL MANOMETRY (EM);  Surgeon: Napoleon Form, MD;  Location: [facility];  Service: Endoscopy;  Laterality: N/A;   ESOPHAGOGASTRODUODENOSCOPY (EGD) WITH PROPOFOL N/A 12/26/2016   Dr. Darrick Penna: one benign-appearing, intrinsic stenosis that was widely patent and traversed. Small hiatal hernia, few small sessile polyps. Chronic gastritis. Normal duodenum, negative celiac sprue.    ESOPHAGOGASTRODUODENOSCOPY (EGD) WITH PROPOFOL N/A 09/23/2019   Procedure: ESOPHAGOGASTRODUODENOSCOPY (EGD) WITH PROPOFOL;  Surgeon: West Bali, MD;  Location: [facility];  Service: Endoscopy;   Laterality: N/A;  11:00am   HERNIA REPAIR     inguinal as a baby    left ovarian removal due to cyst     PILONIDAL CYST / SINUS EXCISION  2009   RECTOCELE REPAIR N/A 10/02/2017   Procedure: POSTERIOR REPAIR (RECTOCELE);  Surgeon: Tilda Burrow, MD;  Location: [facility];  Service: Gynecology;  Laterality: N/A;   SAVORY DILATION N/A 09/23/2019   Procedure: SAVORY DILATION;  Surgeon: West Bali, MD;  Location: [facility];  Service: Endoscopy;  Laterality: N/A;   SINOSCOPY     TUBAL LIGATION     VAGINAL HYSTERECTOMY N/A 10/02/2017   Procedure: HYSTERECTOMY VAGINAL;  Surgeon: Tilda Burrow, MD;  Location: [facility];  Service: Gynecology;  Laterality: N/A;   Social History   Socioeconomic History   Marital status: Married    Spouse name: Not on file   Number of children: 4   Years of education: Some College   Highest education level: Not on file  Occupational History   Not on file  Tobacco Use   Smoking status: Never   Smokeless tobacco: Never  Vaping Use   Vaping Use: Never used  Substance and Sexual Activity   Alcohol use: No    Alcohol/week: 0.0 standard drinks   Drug use: No   Sexual activity: Yes    Birth control/protection: Surgical    Comment: hyst  Other Topics Concern   Not on file  Social History Narrative   Lives at  home with husband and children.   Right-handed.   4 cups caffeine per week.   Social Determinants of Corporate investment banker Strain: Not on file  Food Insecurity: Not on file  Transportation Needs: Not on file  Physical Activity: Not on file  Stress: Not on file  Social Connections: Not on file   Family History  Problem Relation Age of Onset   Cancer Mother        skin   Depression Mother    Hypertension Mother    Hyperlipidemia Mother    Diabetes Mother    Cancer Father        skin   Arthritis Father    Hypertension Father    Hyperlipidemia Father    Asthma Brother    Depression Maternal Grandmother    Hypertension Maternal  Grandmother    Mental illness Maternal Grandmother    Cancer Maternal Grandfather        kidney   Hypertension Maternal Grandfather    Kidney disease Maternal Grandfather    Emphysema Paternal Grandmother    Alzheimer's disease Paternal Grandmother    Parkinson's disease Paternal Grandfather    Colon cancer Neg Hx    Colon polyps Neg Hx    Allergic rhinitis Neg Hx    Eczema Neg Hx    Urticaria Neg Hx    Allergies  Allergen Reactions   Morphine And Related Itching    Felt like burning all over   Dilaudid [Hydromorphone Hcl] Nausea And Vomiting   Duloxetine Other (See Comments)    Felt like she was floating and "didn't feel right" on it   Sulfa Antibiotics Nausea Only   Toradol [Ketorolac Tromethamine] Nausea Only   Vicodin [Hydrocodone-Acetaminophen] Itching   Prior to Admission medications   Medication Sig Start Date End Date Taking? Authorizing Provider  Cholecalciferol (VITAMIN D3) 125 MCG (5000 UT) CAPS Take 5,000 Units by mouth daily.   Yes [provider]  ibuprofen (ADVIL) 200 MG tablet Take 400 mg by mouth every 6 (six) hours as needed for headache or moderate pain.   Yes [provider]  methylphenidate 18 MG PO CR tablet Take 18 mg by mouth daily.   Yes [provider]  pantoprazole (PROTONIX) 40 MG tablet Take 1 tablet (40 mg total) by mouth 2 (two) times daily before a meal. 01/06/20  Yes Anice Paganini, NP  propranolol ER (INDERAL LA) 80 MG 24 hr capsule Take 80 mg by mouth at bedtime.  01/13/19  Yes [provider]  TURMERIC CURCUMIN PO Take 1 capsule by mouth daily.   Yes [provider]  vitamin C (ASCORBIC ACID) 500 MG tablet Take 500 mg by mouth at bedtime.   Yes [provider]  Vitamin D-Vitamin K (VITAMIN K2-VITAMIN D3 PO) Take 1 tablet by mouth daily.   Yes [provider]   No results found.  Positive ROS: All other systems have been reviewed and were otherwise negative with the exception of those  mentioned in the HPI and as above.  Physical Exam: General: Alert, no acute distress Cardiovascular: No pedal edema Respiratory: No cyanosis, no use of accessory musculature GI: No organomegaly, abdomen is soft and non-tender Skin: No lesions in the area of chief complaint Neurologic: Sensation intact distally Psychiatric: Patient is competent for consent with normal mood and affect Lymphatic: No axillary or cervical lymphadenopathy  MUSCULOSKELETAL:  Right shoulder:  No open wounds or lesions.  Neurovascular intact distal.  Assessment: 1.  Right  shoulder rotator cuff tear  2.  Right shoulder subacromial impingement  3 right shoulder proximal biceps tendinopathy  Plan: -Plan to proceed today with arthroscopic intervention of the right shoulder.  Plan will be for arthroscopic rotator cuff repair with biceps tenotomy and subacromial decompression and debridements as indicated.  We discussed the risk of bleeding, infection, damage to surrounding nerves and vessels, stiffness, failure of pain relief, need for further surgery, failure of repairs, and the risk of anesthesia.  She has provided informed consent.  -Plan for discharge home postoperatively.    Yolonda Kida, MD Cell [phone]    09/22/2021 7:29 AM

## 2021-09-22 NOTE — Op Note (Signed)
09/22/2021   PATIENT:  Ruth Shah    PRE-OPERATIVE DIAGNOSIS:   Right shoulder superior labrum tear Right shoulder proximal biceps tendinitis Right shoulder partial supraspinatus rotator cuff tear Right shoulder subacromial impingement  POST-OPERATIVE DIAGNOSIS:   Right shoulder superior labrum tear Right shoulder proximal biceps tendinitis Right shoulder partial supraspinatus rotator cuff tear Right shoulder subacromial impingement Right shoulder reverse humeral avulsion of inferior glenohumeral ligament  PROCEDURE:   Right shoulder arthroscopic posterior capsulorrhaphy for reverse HAGL lesion Right shoulder arthroscopic extensive debridement of superior labrum, anterior labrum, rotator interval Right shoulder arthroscopic subacromial decompression with coracoacromial ligament recession Right shoulder arthroscopic biceps tenotomy  SURGEON:  Yolonda Kida, MD  PHYSICIAN ASSISTANT: None  ANESTHESIA:   General  ESTIMATED BLOOD LOSS: 10 cc  PREOPERATIVE INDICATIONS:  Ruth Shah is a  45 y.o. female with a diagnosis of Right shoulder rotator cuff tear, impingement who failed conservative measures and elected for surgical management.    The risks benefits and alternatives were discussed with the patient preoperatively including but not limited to the risks of infection, bleeding, nerve injury, cardiopulmonary complications, the need for revision surgery, among others, and the patient was willing to proceed.  OPERATIVE IMPLANTS: Arthrex 1.8 mm fiber tack knotless anchor x1  OPERATIVE FINDINGS:  On the intra-articular portion of the arthroscopy there was no glenohumeral arthritis.  There was inflammation of the rotator interval and proximal biceps.  There was anterior and superior degenerative labral tearing.  Subscapularis was intact.  No articular tears noted of the supraspinatus or infraspinatus.  Along the teres minor attachment there was a bare area noted on  the humeral attachment.  This was associated with retraction of the glenohumeral ligament on the humeral side consistent with humeral avulsion of glenohumeral ligament.  This was in the posterior aspect and consistent with a reverse lesion.  In the subacromial space there was mild to moderate subacromial bursitis.  There was a type II acromion.  No bursal sided rotator cuff pathology.   OPERATIVE PROCEDURE: The patient was brought to the operating room and placed in the supine position. General anesthesia was administered. IV antibiotics were given. General anesthesia was administered.   The upper extremity was examined and found to be moving symmetrically with the contralateral limb.. The upper extremity was prepped and draped in the usual sterile fashion. The patient was in a semilateral decubitus position.  Time out was performed. Diagnostic arthroscopy was carried out the above-named findings.   We began the procedure with the intra-articular portion.  We established a posterior lateral viewing portal 2 cm distant and 1 cm medial to the posterior lateral corner of the acromion.  We then performed diagnostic arthroscopy which demonstrated the above findings.  Once we identified the posterior humeral avulsion of the glenohumeral ligament we established a 7:00 portal via spinal needle localization in the low posterior aspect of the axillary pouch.  We then introduced a cannula.  We then prepared the bony bed on the humeral side with motorized shaver.  This decorticated gently that area of bare attachment point.  We then introduced a 1.8 mm Arthrex fiber tack knotless anchor.  First by drilling and then tapping the anchor into the pilot hole.  We then used an antegrade suture passer with the skin [place].  We retrieved the working suture from the intra-articular portion with a nice generous bite of posterior capsule and ligament.  We then used the knotless mechanism to secure this back to the prepared  bone  bed at the anchor attachment to the posterior humeral head.  This had a nice repair effect on the humeral avulsed ligament.  Next moved our attention to the extensive debridement.  We will get a established a mid glenoid working portal just in the rotator interval space with spinal needle localization.  We then used a motorized shaver to debride anterior labrum, superior labrum, and rotator interval.  All inflammatory tissue was removed.  We then moved our attention to the biceps tenotomy.  While working from the anterior portal and viewing from the posterior lateral portal we performed a biceps tenotomy with radiofrequency wand.  This was resected off of the superior labrum.  Lastly, we moved our attention to the subacromial space.  We established a working portal at the midpoint of the [facility] joint 4 cm distant to the lateral acromion.  While viewing from the posterior portal and working from the lateral portal we performed a wide bursectomy.  Were able to identify that there was some bursal fraying of the supraspinatus but no tearing of the rotator cuff musculature.  We then moved our attention to the subacromial decompression.  We skeletonized the anterolateral aspect of the acromion with the radiofrequency wand.  We then worked from the posterior lateral portal and viewed from the lateral portal and used a cutting block technique to perform a acromioplasty.  We used a cutting block technique working posterior and flattened our type II acromion to a nice type I.  Arthroscopic instruments were removed from the shoulder.  Wounds were closed with 3-0 Monocryl.  The arm was cleaned and dried and sterile dressing applied as well as a shoulder brace/sling.  Patient was awakened from general anesthetic with no noted complications.  Transported the PACU in stable condition.  Disposition:  The patient will be nonweightbearing with an abduction sling to the operative extremity.  We will plan to rehabilitate her  along the posterior labrum protocol.  This will avoid cross body adduction or internal rotation for the first 5 weeks.  She will be in her sling for 5 weeks and nonweightbearing.  She can begin physical therapy next week.  I will see her in the office in 1 week.

## 2021-09-22 NOTE — Transfer of Care (Signed)
Immediate Anesthesia Transfer of Care Note  Patient: Ruth Shah  Procedure(s) Performed: SHOULDER ARTHROSCOPY WITH ROTATOR CUFF REPAIR, BICEPS TENOTOMY, SUBACROMIAL DECOMPRESSION, DEBRIDEMENT (Right)  Patient Location: PACU  Anesthesia Type:GA combined with regional for post-op pain  Level of Consciousness: drowsy and patient cooperative  Airway & Oxygen Therapy: Patient Spontanous Breathing  Post-op Assessment: Report given to RN and Post -op Vital signs reviewed and stable  Post vital signs: Reviewed and stable  Last Vitals:  Vitals Value Taken Time  BP    Temp    Pulse    Resp    SpO2      Last Pain:  Vitals:   09/22/21 0630  TempSrc:   PainSc: 0-No pain         Complications: No notable events documented.

## 2021-09-22 NOTE — Brief Op Note (Signed)
09/22/2021  9:22 AM  PATIENT:  Ruth Shah  45 y.o. female  PRE-OPERATIVE DIAGNOSIS:  Right shoulder rotator cuff tear, impingement  POST-OPERATIVE DIAGNOSIS:  Right shoulder rotator cuff tear, impingement  PROCEDURE:  Procedure(s) with comments: SHOULDER ARTHROSCOPY WITH ROTATOR CUFF REPAIR, BICEPS TENOTOMY, SUBACROMIAL DECOMPRESSION, DEBRIDEMENT (Right) - 120  SURGEON:  Surgeon(s) and Role:    * Aundria Rud, Noah Delaine, MD - Primary  PHYSICIAN ASSISTANT: none  ASSISTANTS: none   ANESTHESIA:   regional and general  EBL:  10 mL   BLOOD ADMINISTERED:none  DRAINS: none   LOCAL MEDICATIONS USED:  NONE  SPECIMEN:  No Specimen  DISPOSITION OF SPECIMEN:  N/A  COUNTS:  YES  TOURNIQUET:  * No tourniquets in log *  DICTATION: .Note written in EPIC  PLAN OF CARE: Discharge to home after PACU  PATIENT DISPOSITION:  PACU - hemodynamically stable.   Delay start of Pharmacological VTE agent (>24hrs) due to surgical blood loss or risk of bleeding: not applicable

## 2021-09-24 ENCOUNTER — Emergency Department (HOSPITAL_COMMUNITY): Payer: Medicaid Other

## 2021-09-24 ENCOUNTER — Emergency Department (HOSPITAL_COMMUNITY)
Admission: EM | Admit: 2021-09-24 | Discharge: 2021-09-24 | Disposition: A | Discharge status: Home / Self Care | Payer: Medicaid Other | Attending: Emergency Medicine | Admitting: Emergency Medicine

## 2021-09-24 DIAGNOSIS — Z79899 Other long term (current) drug therapy: Secondary | ICD-10-CM | POA: Diagnosis not present

## 2021-09-24 DIAGNOSIS — I10 Essential (primary) hypertension: Secondary | ICD-10-CM | POA: Diagnosis not present

## 2021-09-24 DIAGNOSIS — R0602 Shortness of breath: Secondary | ICD-10-CM | POA: Diagnosis present

## 2021-09-24 DIAGNOSIS — J4520 Mild intermittent asthma, uncomplicated: Secondary | ICD-10-CM | POA: Diagnosis not present

## 2021-09-24 LAB — TROPONIN I (HIGH SENSITIVITY)
Troponin I (High Sensitivity): 5 ng/L (ref <18)
Troponin I (High Sensitivity): 9 ng/L (ref <18)
Troponin I (High Sensitivity): 6 ng/L (ref <18)

## 2021-09-24 LAB — BASIC METABOLIC PANEL
Anion gap: 7 (ref 5–15)
BUN: 11 mg/dL (ref 6–20)
CO2: 27 mmol/L (ref 22–32)
Calcium: 8.7 mg/dL — ABNORMAL LOW (ref 8.9–10.3)
Chloride: 103 mmol/L (ref 98–111)
Creatinine, Ser: 0.95 mg/dL (ref 0.44–1.00)
GFR, Estimated: >60 mL/min (ref >60)
Glucose, Bld: 94 mg/dL (ref 70–99)
Potassium: 3.4 mmol/L — ABNORMAL LOW (ref 3.5–5.1)
Sodium: 137 mmol/L (ref 135–145)

## 2021-09-24 LAB — CBC WITH DIFFERENTIAL/PLATELET
Abs Immature Granulocytes: 0.05 10*3/uL (ref 0.00–0.07)
Basophils Absolute: 0.1 10*3/uL (ref 0.0–0.1)
Basophils Relative: 1 %
Eosinophils Absolute: 0.2 10*3/uL (ref 0.0–0.5)
Eosinophils Relative: 2 %
HCT: 39.6 % (ref 36.0–46.0)
Hemoglobin: 13.1 g/dL (ref 12.0–15.0)
Immature Granulocytes: 1 %
Lymphocytes Relative: 21 %
Lymphs Abs: 2.1 10*3/uL (ref 0.7–4.0)
MCH: 29.8 pg (ref 26.0–34.0)
MCHC: 33.1 g/dL (ref 30.0–36.0)
MCV: 90 fL (ref 80.0–100.0)
Monocytes Absolute: 0.8 10*3/uL (ref 0.1–1.0)
Monocytes Relative: 8 %
Neutro Abs: 6.7 10*3/uL (ref 1.7–7.7)
Neutrophils Relative %: 67 %
Platelets: 317 10*3/uL (ref 150–400)
RBC: 4.4 MIL/uL (ref 3.87–5.11)
RDW: 13 % (ref 11.5–15.5)
WBC: 9.9 10*3/uL (ref 4.0–10.5)
nRBC: 0 % (ref 0.0–0.2)

## 2021-09-24 MED ORDER — DIPHENHYDRAMINE HCL 50 MG/ML IJ SOLN
25.0000 mg | Freq: Once | INTRAMUSCULAR | Status: AC
  Administered 2021-09-24: 25 mg via INTRAVENOUS
  Filled 2021-09-24: qty 1

## 2021-09-24 MED ORDER — ONDANSETRON HCL 4 MG/2ML IJ SOLN
4.0000 mg | Freq: Once | INTRAMUSCULAR | Status: AC
  Administered 2021-09-24: 4 mg via INTRAVENOUS
  Filled 2021-09-24: qty 2

## 2021-09-24 MED ORDER — IOHEXOL 350 MG/ML SOLN
75.0000 mL | Freq: Once | INTRAVENOUS | Status: AC | PRN
  Administered 2021-09-24: 75 mL via INTRAVENOUS

## 2021-09-24 NOTE — ED Provider Notes (Signed)
Emergency Medicine Provider Triage Evaluation Note  Ruth Shah , a 45 y.o. female  was evaluated in triage.  Pt complains of shortness of breath.  Patient had rotator cuff surgery 2 days ago, after surgery was feeling short of breath and like she could not catch her breath which they said could be due to the nerve block but even after the nerve block wore off she says she is continued to have persistent and worsening shortness of breath.  She constantly feels like she has to take a deep breath to try and catch her breath.  No chest pain but does have some pain underneath her right breast that is worse with deep inspiration.  She reports that the pain from her right shoulder is very manageable and she is regularly been taking Tylenol.  Review of Systems  Positive: Shortness of breath Negative: Fever, cough, chest pain, abdominal pain  Physical Exam  BP (!) 165/107 (BP Location: Left Arm)    Pulse 62    Temp 99.2 F (37.3 C) (Oral)    Resp 20    SpO2 100%  Gen:   Awake, no distress   Resp:  Patient repeatedly having to take deep breaths, satting well on room air but appears uncomfortable MSK:   Moves extremities without difficulty, distal pulses 2+ in right upper extremity, sling in place from recent rotator cuff surgery Other:  Abdomen soft, nontender  Medical Decision Making  Medically screening exam initiated at 12:05 PM.  Appropriate orders placed.  Ruth Shah was informed that the remainder of the evaluation will be completed by another provider, this initial triage assessment does not replace that evaluation, and the importance of remaining in the ED until their evaluation is complete.     Dartha Lodge, PA-C 09/24/21 1218    Linwood Dibbles, MD 09/24/21 [phone]

## 2021-09-24 NOTE — ED Notes (Addendum)
Provider notified of pt bp. Pt has hx of hypertension and on propanolol at home. Will continue to monitor.

## 2021-09-24 NOTE — ED Provider Notes (Signed)
[facility] EMERGENCY DEPARTMENT Provider Note   CSN: [id number] Arrival date & time: 09/24/21  1129     History Chief Complaint  Patient presents with   Shortness of Breath    Ruth Shah is a 45 y.o. female.  HPI Patient is a 45 year old female with a medical history as noted below.  She presents to the emergency department today status post right rotator cuff repair by Dr. Aundria Rud on December 29.  She states that since having the operation she has had shortness of breath as well as mild right-sided chest tightness that worsens with deep breaths.  She was told that after the nerve block wore off that her symptoms would likely improve.  She states her symptoms have persisted and mildly worsened so she came to the emergency department for further evaluation.  No fever, cough, abdominal pain.    Past Medical History:  Diagnosis Date   ADD (attention deficit disorder)    Arthritis    Asthma    mild seasonal reactions to mold and mildew   Back pain    Depression    Family history of adverse reaction to anesthesia    mother has severe PONV   Fatty liver    Fibromyalgia    GERD (gastroesophageal reflux disease)    Herpes simplex disease    History of kidney stones    Hypertension    Migraines    Paresthesia of both hands    PONV (postoperative nausea and vomiting)    Urticaria     Patient Active Problem List   Diagnosis Date Noted   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   Fatty liver 09/30/2020   IBS (irritable bowel syndrome) 09/30/2020   Chronic cholecystitis    Radiculopathy due to lumbar intervertebral disc disorder 10/16/2019   Rectal leakage 06/17/2019   Abdominal pain 09/03/2018   Rectal pain 09/03/2018   Mild intermittent asthma, uncomplicated 04/24/2018   Adverse food reaction 04/24/2018   Perennial allergic rhinitis 04/24/2018   Allergic urticaria 04/24/2018   Gastroesophageal reflux disease    Hiatal hernia    Hematometra 10/02/2017   Pelvic  pain in female 10/02/2017   Status post vaginal hysterectomy 10/02/2017   Rectal bleeding 07/26/2017   Rectocele, female 06/28/2017   Uterine prolapse 06/28/2017   Diarrhea 04/26/2017   Chronic bilateral low back pain without sciatica 02/15/2017   Dyspepsia 12/21/2016   Dysphagia 12/21/2016   Nausea without vomiting 12/21/2016   Chronic migraine 07/20/2016   Neck pain 07/20/2016   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   H/O Genital warts 08/12/2015    Past Surgical History:  Procedure Laterality Date   BIOPSY  09/23/2019   Procedure: BIOPSY;  Surgeon: West Bali, MD;  Location: [facility] SU[facility];  Service: Endoscopy;;  esophagus   CHOLECYSTECTOMY N/A 04/08/2020   Procedure: LAPAROSCOPIC CHOLECYSTECTOMY;  Surgeon: Franky Macho, MD;  Location: [facility] ORS;  Service: General;  Laterality: N/A;   ENDOMETRIAL ABLATION     ESOPHAGEAL MANOMETRY N/A 10/15/2017   Procedure: ESOPHAGEAL MANOMETRY (EM);  Surgeon: Napoleon Form, MD;  Location: [facility] ENDOSCOPY;  Service: Endoscopy;  Laterality: N/A;   ESOPHAGOGASTRODUODENOSCOPY (EGD) WITH PROPOFOL N/A 12/26/2016   Dr. Darrick Penna: one benign-appearing, intrinsic stenosis that was widely patent and traversed. Small hiatal hernia, few small sessile polyps. Chronic gastritis. Normal duodenum, negative celiac sprue.    ESOPHAGOGASTRODUODENOSCOPY (EGD) WITH PROPOFOL N/A 09/23/2019   Procedure: ESOPHAGOGASTRODUODENOSCOPY (EGD) WITH PROPOFOL;  Surgeon: West Bali, MD;  Location: [facility];  Service: Endoscopy;  Laterality: N/A;  11:00am   HERNIA REPAIR     inguinal as a baby    left ovarian removal due to cyst     PILONIDAL CYST / SINUS EXCISION  2009   RECTOCELE REPAIR N/A 10/02/2017   Procedure: POSTERIOR REPAIR (RECTOCELE);  Surgeon: Tilda Burrow, MD;  Location: [facility];  Service: Gynecology;  Laterality: N/A;   SAVORY DILATION N/A 09/23/2019   Procedure: SAVORY DILATION;  Surgeon: West Bali, MD;  Location: [facility];  Service: Endoscopy;   Laterality: N/A;   SHOULDER ARTHROSCOPY WITH ROTATOR CUFF REPAIR Right 09/22/2021   Procedure: SHOULDER ARTHROSCOPY WITH ROTATOR CUFF REPAIR, BICEPS TENOTOMY, SUBACROMIAL DECOMPRESSION, DEBRIDEMENT;  Surgeon: Yolonda Kida, MD;  Location: [facility] OR;  Service: Orthopedics;  Laterality: Right;  120   SINOSCOPY     TUBAL LIGATION     VAGINAL HYSTERECTOMY N/A 10/02/2017   Procedure: HYSTERECTOMY VAGINAL;  Surgeon: Tilda Burrow, MD;  Location: [facility];  Service: Gynecology;  Laterality: N/A;     OB History     Gravida  5   Para  4   Term  2   Preterm  2   AB  1   Living  4      SAB  1   IAB      Ectopic      Multiple  1   Live Births  4           Family History  Problem Relation Age of Onset   Cancer Mother        skin   Depression Mother    Hypertension Mother    Hyperlipidemia Mother    Diabetes Mother    Cancer Father        skin   Arthritis Father    Hypertension Father    Hyperlipidemia Father    Asthma Brother    Depression Maternal Grandmother    Hypertension Maternal Grandmother    Mental illness Maternal Grandmother    Cancer Maternal Grandfather        kidney   Hypertension Maternal Grandfather    Kidney disease Maternal Grandfather    Emphysema Paternal Grandmother    Alzheimer's disease Paternal Grandmother    Parkinson's disease Paternal Grandfather    Colon cancer Neg Hx    Colon polyps Neg Hx    Allergic rhinitis Neg Hx    Eczema Neg Hx    Urticaria Neg Hx     Social History   Tobacco Use   Smoking status: Never   Smokeless tobacco: Never  Vaping Use   Vaping Use: Never used  Substance Use Topics   Alcohol use: No    Alcohol/week: 0.0 standard drinks   Drug use: No    Home Medications Prior to Admission medications   Medication Sig Start Date End Date Taking? Authorizing Provider  Cholecalciferol (VITAMIN D3) 125 MCG (5000 UT) CAPS Take 5,000 Units by mouth daily.    [provider]  ibuprofen (ADVIL) 200  MG tablet Take 400 mg by mouth every 6 (six) hours as needed for headache or moderate pain.    [provider]  methylphenidate 18 MG PO CR tablet Take 18 mg by mouth daily.    [provider]  ondansetron (ZOFRAN-ODT) 4 MG disintegrating tablet Take 1 tablet (4 mg total) by mouth every 8 (eight) hours as needed for nausea or vomiting. 09/22/21   Yolonda Kida, MD  oxyCODONE (ROXICODONE) 5 MG immediate release tablet  Take 1 tablet (5 mg total) by mouth every 6 (six) hours as needed for severe pain or breakthrough pain. 09/22/21 09/22/22  Yolonda Kida, MD  pantoprazole (PROTONIX) 40 MG tablet Take 1 tablet (40 mg total) by mouth 2 (two) times daily before a meal. 01/06/20   Anice Paganini, NP  propranolol ER (INDERAL LA) 80 MG 24 hr capsule Take 80 mg by mouth at bedtime.  01/13/19   [provider]  TURMERIC CURCUMIN PO Take 1 capsule by mouth daily.    [provider]  vitamin C (ASCORBIC ACID) 500 MG tablet Take 500 mg by mouth at bedtime.    [provider]  Vitamin D-Vitamin K (VITAMIN K2-VITAMIN D3 PO) Take 1 tablet by mouth daily.    [provider]    Allergies    Morphine and related, Dilaudid [hydromorphone hcl], Duloxetine, Sulfa antibiotics, Toradol [ketorolac tromethamine], and Vicodin [hydrocodone-acetaminophen]  Review of Systems   Review of Systems  All other systems reviewed and are negative. Ten systems reviewed and are negative for acute change, except as noted in the HPI.   Physical Exam Updated Vital Signs BP (!) 186/102    Pulse 64    Temp 99.2 F (37.3 C) (Oral)    Resp 15    SpO2 100%   Physical Exam Vitals and nursing note reviewed.  Constitutional:      General: She is not in acute distress.    Appearance: Normal appearance. She is not ill-appearing, toxic-appearing or diaphoretic.     Interventions: She is not intubated. HENT:     Head: Normocephalic and atraumatic.     Right Ear: External ear  normal.     Left Ear: External ear normal.     Nose: Nose normal.     Mouth/Throat:     Mouth: Mucous membranes are moist.     Pharynx: Oropharynx is clear. No oropharyngeal exudate or posterior oropharyngeal erythema.  Eyes:     Extraocular Movements: Extraocular movements intact.  Cardiovascular:     Rate and Rhythm: Normal rate and regular rhythm.     Pulses: Normal pulses.     Heart sounds: Normal heart sounds. No murmur heard.   No friction rub. No gallop.  Pulmonary:     Effort: Pulmonary effort is normal. No tachypnea, bradypnea, accessory muscle usage or respiratory distress. She is not intubated.     Breath sounds: Normal breath sounds. No stridor. No decreased breath sounds, wheezing, rhonchi or rales.     Comments: LCTAB. No wheezing, rales or rhonchi. Chest:     Chest wall: No tenderness.  Abdominal:     General: Abdomen is flat.     Palpations: Abdomen is soft.     Tenderness: There is no abdominal tenderness.  Musculoskeletal:        General: Normal range of motion.     Cervical back: Normal range of motion and neck supple. No tenderness.     Comments: Recent right shoulder surgery.  Right arm in a sling.  2+ radial pulses noted bilaterally.  Skin:    General: Skin is warm and dry.  Neurological:     General: No focal deficit present.     Mental Status: She is alert and oriented to person, place, and time.  Psychiatric:        Mood and Affect: Mood normal.        Behavior: Behavior normal.   ED Results / Procedures / Treatments   Labs (all labs  ordered are listed, but only abnormal results are displayed) Labs Reviewed  BASIC METABOLIC PANEL - Abnormal; Notable for the following components:      Result Value   Potassium 3.4 (*)    Calcium 8.7 (*)    All other components within normal limits  CBC WITH DIFFERENTIAL/PLATELET  TROPONIN I (HIGH SENSITIVITY)  TROPONIN I (HIGH SENSITIVITY)  TROPONIN I (HIGH SENSITIVITY)   EKG None  Radiology CT Angio Chest  PE W and/or Wo Contrast  Result Date: 09/24/2021 CLINICAL DATA:  Shortness of breath. EXAM: CT ANGIOGRAPHY CHEST WITH CONTRAST TECHNIQUE: Multidetector CT imaging of the chest was performed using the standard protocol during bolus administration of intravenous contrast. Multiplanar CT image reconstructions and MIPs were obtained to evaluate the vascular anatomy. CONTRAST:  37mL OMNIPAQUE IOHEXOL 350 MG/ML SOLN COMPARISON:  None available currently. FINDINGS: Cardiovascular: Satisfactory opacification of the pulmonary arteries to the segmental level. No evidence of pulmonary embolism. Normal heart size. No pericardial effusion. Mediastinum/Nodes: No enlarged mediastinal, hilar, or axillary lymph nodes. Thyroid gland, trachea, and esophagus demonstrate no significant findings. Lungs/Pleura: No pneumothorax or pleural effusion is noted. Left lung is clear. Mild right basilar subsegmental atelectasis is noted. Upper Abdomen: Hepatic steatosis. Musculoskeletal: No chest wall abnormality. No acute or significant osseous findings. Review of the MIP images confirms the above findings. IMPRESSION: No definite evidence of pulmonary embolus. Mild right basilar subsegmental atelectasis. Hepatic steatosis. Electronically Signed   By: Lupita Raider M.D.   On: 09/24/2021 18:21    Procedures Procedures   Medications Ordered in ED Medications  ondansetron (ZOFRAN) injection 4 mg (4 mg Intravenous Given 09/24/21 1739)  diphenhydrAMINE (BENADRYL) injection 25 mg (25 mg Intravenous Given 09/24/21 1739)  iohexol (OMNIPAQUE) 350 MG/ML injection 75 mL (75 mLs Intravenous Contrast Given 09/24/21 1800)    ED Course  I have reviewed the triage vital signs and the nursing notes.  Pertinent labs & imaging results that were available during my care of the patient were reviewed by me and considered in my medical decision making (see chart for details).    MDM Rules/Calculators/A&P                          Pt is a 45  y.o. female status post rotator cuff repair on December 2019 presents to the emergency department with chest tightness and shortness of breath.  Labs: CBC without abnormalities. BMP with a potassium of 3.4 and a calcium of 8.7 Troponin of 5 with a repeat of 9.  Imaging: CTA of the chest showing  IMPRESSION: No definite evidence of pulmonary embolus. Mild right basilar subsegmental atelectasis. Hepatic steatosis.   I, Placido Sou, PA-C, personally reviewed and evaluated these images and lab results as part of my medical decision-making.  Patient presents today after having her right rotator cuff repair 2 days ago.  She has had persistent mild shortness of breath as well as right-sided chest tightness.  Given her recent surgery there was concern for PE.  CTA of the chest was obtained in triage showing no definitive evidence of pulmonary embolus.  Troponins appear reassuring at 5 at 9.  ECG does not appear ischemic.  Doubt ACS.  Patient incidentally noted to have a high blood pressure since arriving.  Ranged from 165/107 to 182/102.  Patient states that she takes propanolol and has been aware that she has been hypertensive for "many months".  She denies any headaches, visual changes, numbness, weakness.  Doubt hypertensive  emergency at this time.  Patient states she is going to follow-up with her PCP as soon as possible to discuss her blood pressure further.  Feel that the patient is stable for discharge at this time and she is agreeable.  We discussed return precautions.  Recommended follow-up with her surgeon as needed.  Her questions were answered and she was amicable at the time of discharge.  Note: Portions of this report may have been transcribed using voice recognition software. Every effort was made to ensure accuracy; however, inadvertent computerized transcription errors may be present.   Final Clinical Impression(s) / ED Diagnoses Final diagnoses:  Shortness of breath  Hypertension,  unspecified type   Rx / DC Orders ED Discharge Orders     None        Placido Sou, PA-C 09/24/21 2038    Pricilla Loveless, MD 09/25/21 [phone]

## 2021-09-24 NOTE — Discharge Instructions (Signed)
Like we discussed, please return to the emergency department with any new or worsening symptoms.  Please call your primary care doctor in the near to have your blood pressure reassessed and addressed.  It was a pleasure to meet you.

## 2021-09-24 NOTE — ED Notes (Addendum)
Patient verbalizes understanding of discharge instructions. Opportunity for questioning and answers were provided. Armband removed by staff, pt discharged from ED. Wheeled out to lobby with husband   

## 2021-09-24 NOTE — ED Triage Notes (Signed)
Pt. Stated, I had surgery , rotator cuff on Thursday and for the past 2 days ive been SOB, I just cant catch my breath.

## 2021-10-06 ENCOUNTER — Ambulatory Visit (HOSPITAL_COMMUNITY): Payer: Medicaid Other | Attending: Orthopedic Surgery

## 2021-10-06 ENCOUNTER — Encounter (HOSPITAL_COMMUNITY): Payer: Self-pay

## 2021-10-06 ENCOUNTER — Other Ambulatory Visit: Payer: Self-pay

## 2021-10-06 DIAGNOSIS — M25611 Stiffness of right shoulder, not elsewhere classified: Secondary | ICD-10-CM | POA: Insufficient documentation

## 2021-10-06 DIAGNOSIS — M25511 Pain in right shoulder: Secondary | ICD-10-CM | POA: Insufficient documentation

## 2021-10-06 DIAGNOSIS — R29898 Other symptoms and signs involving the musculoskeletal system: Secondary | ICD-10-CM | POA: Insufficient documentation

## 2021-10-06 NOTE — Patient Instructions (Signed)
TOWEL SLIDES COMPLETE FOR 1-3 MINUTES, 3-5 TIMES PER DAY  SHOULDER: Flexion On Table   Place hands on table, elbows straight. Move hips away from body. Press hands down into table.   Abduction (Passive)   With arm out to side, resting on table, PALM DOWN lower head toward arm, keeping trunk away from table.   Copyright  [employer]. All rights reserved.     Internal Rotation (Assistive)   Seated with elbow bent at right angle and held against side, slide arm on table surface in an inward arc.  Activity: Use this motion to brush crumbs off the table.  Copyright  [employer]. All rights reserved.      AROM: Wrist Extension   With right palm down, bend wrist up. Repeat 10____ times per set. Do ____ sets per session. Do __3__ sessions per day.  Copyright  [employer]. All rights reserved.   AROM: Wrist Flexion   With right palm up, bend wrist up. Repeat ___10_ times per set. Do ____ sets per session. Do __3__ sessions per day.  Copyright  [employer]. All rights reserved.   AROM: Forearm Pronation / Supination   With right arm in handshake position, slowly rotate palm down until stretch is felt. Relax. Then rotate palm up until stretch is felt. Repeat __10__ times per set. Do ____ sets per session. Do __3__ sessions per day.

## 2021-10-06 NOTE — Therapy (Addendum)
[facility] [address] [place], [place], [address] Phone: [phone]   Fax:  [phone]  Occupational Therapy Evaluation  Patient Details  Name: Ruth Shah MRN: [id number] Date of Birth: 08-25-1976 Referring Provider (OT): Dr. Duwayne HeckJason Rogers   Encounter Date: 10/06/2021   OT End of Session - 10/06/21 1101     Visit Number 1    Number of Visits 24    Date for OT Re-Evaluation 12/29/21    Authorization Type [employer]    Authorization Time Period Requesting 12 visits initially. Authorization sent.    Authorization - Visit Number 0    Authorization - Number of Visits 12    OT Start Time 1030    OT Stop Time 1108    OT Time Calculation (min) 38 min    Activity Tolerance Patient tolerated treatment well    Behavior During Therapy WFL for tasks assessed/performed             Past Medical History:  Diagnosis Date   ADD (attention deficit disorder)    Arthritis    Asthma    mild seasonal reactions to mold and mildew   Back pain    Depression    Family history of adverse reaction to anesthesia    mother has severe PONV   Fatty liver    Fibromyalgia    GERD (gastroesophageal reflux disease)    Herpes simplex disease    History of kidney stones    Hypertension    Migraines    Paresthesia of both hands    PONV (postoperative nausea and vomiting)    Urticaria     Past Surgical History:  Procedure Laterality Date   BIOPSY  09/23/2019   Procedure: BIOPSY;  Surgeon: West BaliFields, Sandi L, MD;  [facility];  Service: Endoscopy;;  esophagus   CHOLECYSTECTOMY N/A 04/08/2020   Procedure: LAPAROSCOPIC CHOLECYSTECTOMY;  Surgeon: Franky MachoJenkins, Mark, MD;  Location: [facility];  Service: General;  Laterality: N/A;   ENDOMETRIAL ABLATION     ESOPHAGEAL MANOMETRY N/A 10/15/2017   Procedure: ESOPHAGEAL MANOMETRY (EM);  Surgeon: Napoleon FormNandigam, Kavitha V, MD;  [facility];  Service: Endoscopy;  Laterality: N/A;    ESOPHAGOGASTRODUODENOSCOPY (EGD) WITH PROPOFOL N/A 12/26/2016   Dr. Darrick PennaFields: one benign-appearing, intrinsic stenosis that was widely patent and traversed. Small hiatal hernia, few small sessile polyps. Chronic gastritis. Normal duodenum, negative celiac sprue.    ESOPHAGOGASTRODUODENOSCOPY (EGD) WITH PROPOFOL N/A 09/23/2019   Procedure: ESOPHAGOGASTRODUODENOSCOPY (EGD) WITH PROPOFOL;  Surgeon: West BaliFields, Sandi L, MD;  [facility];  Service: Endoscopy;  Laterality: N/A;  11:00am   HERNIA REPAIR     inguinal as a baby    left ovarian removal due to cyst     PILONIDAL CYST / SINUS EXCISION  2009   RECTOCELE REPAIR N/A 10/02/2017   Procedure: POSTERIOR REPAIR (RECTOCELE);  Surgeon: Tilda BurrowFerguson, John V, MD;  Location: [facility];  Service: Gynecology;  Laterality: N/A;   SAVORY DILATION N/A 09/23/2019   Procedure: SAVORY DILATION;  Surgeon: West BaliFields, Sandi L, MD;  [facility];  Service: Endoscopy;  Laterality: N/A;   SHOULDER ARTHROSCOPY WITH ROTATOR CUFF REPAIR Right 09/22/2021   Procedure: SHOULDER ARTHROSCOPY WITH ROTATOR CUFF REPAIR, BICEPS TENOTOMY, SUBACROMIAL DECOMPRESSION, DEBRIDEMENT;  Surgeon: Yolonda Kidaogers, Jason Patrick, MD;  [facility];  Service: Orthopedics;  Laterality: Right;  120   SINOSCOPY     TUBAL LIGATION     VAGINAL HYSTERECTOMY N/A 10/02/2017   Procedure: HYSTERECTOMY VAGINAL;  Surgeon: Tilda BurrowFerguson, John V, MD;  [facility];  Service: Gynecology;  Laterality: N/A;    There were no vitals filed for this visit.   Subjective Assessment - 10/06/21 1042     Subjective  S: It feels good actually.    Pertinent History Patient is a 46 y/o female S/P right shoulder scope RCR/bicep tenotomy/SD/debridement which was performed on 09/22/21 by Dr Duwayne HeckJason Rogers. Patient is currently in a sling.    Patient Stated Goals To return to using her RUE as independently as possible.    Currently in Pain? Yes    Pain Score 1     Pain Location Shoulder    Pain Orientation Right    Pain  Descriptors / Indicators Constant    Pain Type Surgical pain    Pain Onset More than a month ago    Pain Frequency Constant    Aggravating Factors  moving it the wrong way    Pain Relieving Factors pain medication    Effect of Pain on Daily Activities pt is unable to utlize her RUE for any daily tasks.    Multiple Pain Sites No               [facility] OT Assessment - 10/06/21 1035       Assessment   Medical Diagnosis right shoulder scope RCR/biceps tenotomy/SD/debridement    Referring Provider (OT) Dr. Duwayne HeckJason Rogers    Onset Date/Surgical Date 09/22/21    Hand Dominance Right    Next MD Visit 10/12/21    Prior Therapy Prior to shoulder surgery, patient completed OT in this clinic.      Precautions   Precautions Shoulder    Type of Shoulder Precautions See media tab for protocol      Restrictions   Weight Bearing Restrictions Yes    RUE Weight Bearing Non weight bearing      Balance Screen   Has the patient fallen in the past 6 months No      Home  Environment   Family/patient expects to be discharged to: Private residence      Prior Function   Level of Independence Independent    Vocation Part time employment    Vocation Requirements Dog groomer-lifting, holding shoulders up, cutting, reaching, bathing dogs.    Leisure breeds great danes, golden retrievers, french bulldogs-lots of heavy work with arms      ADL   ADL comments Pt is unable to utilize her RUE for any daily tasks.      Mobility   Mobility Status Independent      Written Expression   Dominant Hand Right      Vision - History   Baseline Vision Wears glasses only for reading      Cognition   Overall Cognitive Status Within Functional Limits for tasks assessed      Observation/Other Assessments   Outcome Measures UEFI: 6/80      ROM / Strength   AROM / PROM / Strength AROM;PROM;Strength      Palpation   Palpation comment max fascial restrictions noted in the right upper arm, upper trapezius, and  scapularis region.      AROM   Overall AROM  Unable to assess;Due to precautions      PROM   Overall PROM Comments Assessed supine. IR/er adducted    PROM Assessment Site Shoulder    Right/Left Shoulder Right    Right Shoulder Flexion 92 Degrees    Right Shoulder ABduction 145 Degrees  Right Shoulder Internal Rotation 90 Degrees    Right Shoulder External Rotation 75 Degrees      Strength   Overall Strength Unable to assess;Due to precautions                            UEFI - 10/06/21 1049     UEFI Total Score 6 /80             OT Education - 10/06/21 1100     Education Details table slides, A/ROM elbow, forearm, wrist.    Person(s) Educated Patient    Methods Explanation;Demonstration;Handout;Verbal cues    Comprehension Verbalized understanding;Returned demonstration              OT Short Term Goals - 10/06/21 1656       OT SHORT TERM GOAL #1   Title Pt will be provided with and educated on HEP to improve RUE mobility required for ADL completion while utilizing her RUE for daily tasks at least 50% of the time.    Time 6    Period Weeks    Status New    Target Date 11/17/21      OT SHORT TERM GOAL #2   Title Patient will report a pain level of approximately 5/10 or less when completing basic ADL tasks using her RUE.    Time 6    Period Weeks    Status New      OT SHORT TERM GOAL #3   Title Patient will increase her RUE P/ROM to [facility] in order to increase ability to complete dressing and bathing tasks with less difficulty.    Time 6    Period Weeks    Status New      OT SHORT TERM GOAL #4   Title Patient will increase her RUE strength to 3/5 in order to complete reaching tasks at or below her shoulder.    Time 6    Period Weeks    Status New      OT SHORT TERM GOAL #5   Title Pt will decrease RUE fascial restrictions to a moderate amount to improve ability to perform functional reaching tasks at home and work.    Time 6     Period Weeks    Status New               OT Long Term Goals - 10/06/21 1744       OT LONG TERM GOAL #1   Title Patient will increase her RUE strength to 4+/5 in order to complete work tasks such as lifting dogs of moderate weight and moving items with less difficulty.    Time 12    Period Weeks    Status New    Target Date 12/29/21      OT LONG TERM GOAL #2   Title Patient will increase her RUE A/ROM to [facility] in order to complete all reaching tasks overhead and behind her back with less difficulty.    Time 12    Period Weeks    Status New      OT LONG TERM GOAL #3   Title Patient will report a pain level of approximately 2/10 or less when using her RUE as her dominant extremity at home and at work.    Time 12    Period Weeks    Status New      OT LONG TERM GOAL #4   Title Patient will decrease  her RUE fascial restrictions to minimal amount or less in order to increase the functional mobility needed to complete reaching and lifting tasks.    Time 12    Period Weeks    Status New                 Clinical impression statement: Patient is a 46 y/o female S/P right shoulder scope RCR/bicep tenotomy/SD/debridement causing increased pain, fascial restrictions, and decreased ROM and strength causing the inability to utilize her right UE to complete ADL tasks.   Plan - 10/06/21 1644     OT Occupational Profile and History Problem Focused Assessment - Including review of records relating to presenting problem    Occupational performance deficits (Please refer to evaluation for details): ADL's;IADL's;Rest and Sleep;Leisure;Work    Games developer / Function / Physical Skills ADL;Endurance;UE functional use;Fascial restriction;Pain;ROM;IADL;Strength    Rehab Potential Excellent    Clinical Decision Making Limited treatment options, no task modification necessary    Comorbidities Affecting Occupational Performance: None    Modification or Assistance to Complete Evaluation  No  modification of tasks or assist necessary to complete eval    OT Frequency 2x / week    OT Duration 12 weeks    OT Treatment/Interventions Self-care/ADL training;Ultrasound;Patient/family education;Passive range of motion;Cryotherapy;Electrical Stimulation;Moist Heat;Therapeutic exercise;Manual Therapy;Therapeutic activities;Neuromuscular education    Plan P: Patient will benefit from skilled OT services to increase the functional use of her RUE and allow her to return to using it as her dominant extremity. Treatment plan: follow protocol. Myofascial release, manual stretching, P/ROM, AA/ROM, A/ROM, strengthening. Modalities PRN.    OT Home Exercise Plan eval: table slides, A/ROM elbow, wrist, forearm    Consulted and Agree with Plan of Care Patient             Patient will benefit from skilled therapeutic intervention in order to improve the following deficits and impairments:   Body Structure / Function / Physical Skills: ADL, Endurance, UE functional use, Fascial restriction, Pain, ROM, IADL, Strength       Visit Diagnosis: Other symptoms and signs involving the musculoskeletal system - Plan: Ot plan of care cert/re-cert  Acute pain of right shoulder - Plan: Ot plan of care cert/re-cert  Stiffness of right shoulder, not elsewhere classified - Plan: Ot plan of care cert/re-cert    Problem List Patient Active Problem List   Diagnosis Date Noted   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   Fatty liver 09/30/2020   IBS (irritable bowel syndrome) 09/30/2020   Chronic cholecystitis    Radiculopathy due to lumbar intervertebral disc disorder 10/16/2019   Rectal leakage 06/17/2019   Abdominal pain 09/03/2018   Rectal pain 09/03/2018   Mild intermittent asthma, uncomplicated 04/24/2018   Adverse food reaction 04/24/2018   Perennial allergic rhinitis 04/24/2018   Allergic urticaria 04/24/2018   Gastroesophageal reflux disease    Hiatal hernia    Hematometra 10/02/2017   Pelvic pain in  female 10/02/2017   Status post vaginal hysterectomy 10/02/2017   Rectal bleeding 07/26/2017   Rectocele, female 06/28/2017   Uterine prolapse 06/28/2017   Diarrhea 04/26/2017   Chronic bilateral low back pain without sciatica 02/15/2017   Dyspepsia 12/21/2016   Dysphagia 12/21/2016   Nausea without vomiting 12/21/2016   Chronic migraine 07/20/2016   Neck pain 07/20/2016   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 [place], [place],  [address] Phone: [phone]   Fax:  [phone]  Name: Ruth Shah MRN: [id number] Date of Birth: 09-Jan-1976

## 2021-10-11 ENCOUNTER — Ambulatory Visit (HOSPITAL_COMMUNITY): Payer: Medicaid Other | Admitting: Occupational Therapy

## 2021-10-11 ENCOUNTER — Other Ambulatory Visit: Payer: Self-pay

## 2021-10-11 ENCOUNTER — Encounter (HOSPITAL_COMMUNITY): Payer: Self-pay | Admitting: Occupational Therapy

## 2021-10-11 DIAGNOSIS — M25611 Stiffness of right shoulder, not elsewhere classified: Secondary | ICD-10-CM

## 2021-10-11 DIAGNOSIS — M25511 Pain in right shoulder: Secondary | ICD-10-CM

## 2021-10-11 DIAGNOSIS — R29898 Other symptoms and signs involving the musculoskeletal system: Secondary | ICD-10-CM | POA: Diagnosis not present

## 2021-10-11 NOTE — Therapy (Signed)
[place], [place], [address] Phone: [phone]   Fax:  [phone]  Occupational Therapy Treatment  Patient Details  Name: Ruth Shah MRN: [id number] Date of Birth: 1976-06-11 Referring Provider (OT): Dr. Victorino December   Encounter Date: 10/11/2021   OT End of Session - 10/11/21 1024     Visit Number 2    Number of Visits 24    Date for OT Re-Evaluation 12/29/21    Authorization Type [employer]    Authorization Time Period 12 visits approved 10/11/21-12/12/21    Authorization - Visit Number 1    Authorization - Number of Visits 12    OT Start Time [phone]    OT Stop Time 1027    OT Time Calculation (min) 39 min    Activity Tolerance Patient tolerated treatment well    Behavior During Therapy WFL for tasks assessed/performed             Past Medical History:  Diagnosis Date   ADD (attention deficit disorder)    Arthritis    Asthma    mild seasonal reactions to mold and mildew   Back pain    Depression    Family history of adverse reaction to anesthesia    mother has severe PONV   Fatty liver    Fibromyalgia    GERD (gastroesophageal reflux disease)    Herpes simplex disease    History of kidney stones    Hypertension    Migraines    Paresthesia of both hands    PONV (postoperative nausea and vomiting)    Urticaria     Past Surgical History:  Procedure Laterality Date   BIOPSY  09/23/2019   Procedure: BIOPSY;  Surgeon: Danie Binder, MD;  [facility];  Service: Endoscopy;;  esophagus   CHOLECYSTECTOMY N/A 04/08/2020   Procedure: LAPAROSCOPIC CHOLECYSTECTOMY;  Surgeon: Aviva Signs, MD;  Location: [facility];  Service: General;  Laterality: N/A;   ENDOMETRIAL ABLATION     ESOPHAGEAL MANOMETRY N/A 10/15/2017   Procedure: ESOPHAGEAL MANOMETRY (EM);  Surgeon: Mauri Pole, MD;  [facility];  Service: Endoscopy;  Laterality: N/A;   ESOPHAGOGASTRODUODENOSCOPY  (EGD) WITH PROPOFOL N/A 12/26/2016   Dr. Oneida Alar: one benign-appearing, intrinsic stenosis that was widely patent and traversed. Small hiatal hernia, few small sessile polyps. Chronic gastritis. Normal duodenum, negative celiac sprue.    ESOPHAGOGASTRODUODENOSCOPY (EGD) WITH PROPOFOL N/A 09/23/2019   Procedure: ESOPHAGOGASTRODUODENOSCOPY (EGD) WITH PROPOFOL;  Surgeon: Danie Binder, MD;  [facility];  Service: Endoscopy;  Laterality: N/A;  11:00am   HERNIA REPAIR     inguinal as a baby    left ovarian removal due to cyst     PILONIDAL CYST / SINUS EXCISION  2009   RECTOCELE REPAIR N/A 10/02/2017   Procedure: POSTERIOR REPAIR (RECTOCELE);  Surgeon: Jonnie Kind, MD;  [facility]: [facility];  Service: Gynecology;  Laterality: N/A;   SAVORY DILATION N/A 09/23/2019   Procedure: SAVORY DILATION;  Surgeon: Danie Binder, MD;  [facility];  Service: Endoscopy;  Laterality: N/A;   SHOULDER ARTHROSCOPY WITH ROTATOR CUFF REPAIR Right 09/22/2021   Procedure: SHOULDER ARTHROSCOPY WITH ROTATOR CUFF REPAIR, BICEPS TENOTOMY, SUBACROMIAL DECOMPRESSION, DEBRIDEMENT;  Surgeon: Nicholes Stairs, MD;  Location: [facility];  Service: Orthopedics;  Laterality: Right;  120   SINOSCOPY     TUBAL LIGATION     VAGINAL HYSTERECTOMY N/A 10/02/2017   Procedure: HYSTERECTOMY VAGINAL;  Surgeon:  Jonnie Kind, MD;  Location: [facility];  Service: Gynecology;  Laterality: N/A;    There were no vitals filed for this visit.   Subjective Assessment - 10/11/21 0950     Subjective  S: I see the doctor tomorrow.    Currently in Pain? Yes    Pain Score 1     Pain Location Shoulder    Pain Orientation Right    Pain Descriptors / Indicators Sore    Pain Type Acute pain    Pain Radiating Towards none    Pain Onset More than a month ago    Pain Frequency Intermittent    Aggravating Factors  moving it the wrong way    Pain Relieving Factors pain medication    Effect of Pain on Daily Activities unable to use  RUE for any ADLs.    Multiple Pain Sites No                [facility] OT Assessment - 10/11/21 0949       Assessment   Medical Diagnosis right shoulder scope RCR/biceps tenotomy/SD/debridement      Precautions   Precautions Shoulder    Type of Shoulder Precautions See media tab for protocol                      OT Treatments/Exercises (OP) - 10/11/21 0951       Exercises   Exercises Shoulder      Shoulder Exercises: Supine   Protraction PROM;10 reps    Horizontal ABduction PROM;10 reps    External Rotation PROM;10 reps    Internal Rotation PROM;10 reps    Flexion PROM;10 reps    ABduction PROM;10 reps      Shoulder Exercises: Seated   Extension AROM;10 reps    Row AROM;10 reps    Other Seated Exercises scapular depression, A/ROM, 10X      Shoulder Exercises: Therapy Ball   Flexion 10 reps   3" hold at end ROM   ABduction 10 reps   3" hold at end ROM     Shoulder Exercises: ROM/Strengthening   Thumb Tacks 1' low level    Prot/Ret//Elev/Dep 1' low level      Shoulder Exercises: Isometric Strengthening   Flexion Supine;3X5"    Extension Supine;3X5"    External Rotation Supine;3X5"    Internal Rotation Supine;3X5"    ABduction Supine;3X5"    ADduction Supine;3X5"      Manual Therapy   Manual Therapy Myofascial release    Manual therapy comments Manual therapy completed prior exercises.    Myofascial Release Myofascial release and manual stretching completed to right upper arm, upper trapezius, and scapularis region to decrease fascial restrictions and increase joint mobility in a pain free zone.                      OT Short Term Goals - 10/11/21 1015       OT SHORT TERM GOAL #1   Title Pt will be provided with and educated on HEP to improve RUE mobility required for ADL completion while utilizing her RUE for daily tasks at least 50% of the time.    Time 6    Period Weeks    Status On-going    Target Date 11/17/21      OT SHORT  TERM GOAL #2   Title Patient will report a pain level of approximately 5/10 or less when completing basic ADL tasks using her RUE.  Time 6    Period Weeks    Status On-going      OT SHORT TERM GOAL #3   Title Patient will increase her RUE P/ROM to [facility] in order to increase ability to complete dressing and bathing tasks with less difficulty.    Time 6    Period Weeks    Status On-going      OT SHORT TERM GOAL #4   Title Patient will increase her RUE strength to 3/5 in order to complete reaching tasks at or below her shoulder.    Time 6    Period Weeks    Status On-going      OT SHORT TERM GOAL #5   Title Pt will decrease RUE fascial restrictions to a moderate amount to improve ability to perform functional reaching tasks at home and work.    Time 6    Period Weeks    Status On-going               OT Long Term Goals - 10/11/21 1015       OT LONG TERM GOAL #1   Title Patient will increase her RUE strength to 4+/5 in order to complete work tasks such as lifting dogs of moderate weight and moving items with less difficulty.    Time 12    Period Weeks    Status On-going    Target Date 12/29/21      OT LONG TERM GOAL #2   Title Patient will increase her RUE A/ROM to [facility] in order to complete all reaching tasks overhead and behind her back with less difficulty.    Time 12    Period Weeks    Status On-going      OT LONG TERM GOAL #3   Title Patient will report a pain level of approximately 2/10 or less when using her RUE as her dominant extremity at home and at work.    Time 12    Period Weeks    Status On-going      OT LONG TERM GOAL #4   Title Patient will decrease her RUE fascial restrictions to minimal amount or less in order to increase the functional mobility needed to complete reaching and lifting tasks.    Time 12    Period Weeks    Status On-going                   Plan - 10/11/21 1016     Clinical Impression Statement A: Initiated myofascial  release to RUE to address fascial restrictions, passive stretching completed with pt tolerating P/ROM to [facility] in all ranges. Initiated isometrics and scapular A/ROM. Therapy ball stretches completed. Verbal cuing for form and technique.    Body Structure / Function / Physical Skills ADL;Endurance;UE functional use;Fascial restriction;Pain;ROM;IADL;Strength    Plan P: continue with protocol working to improve ROM    OT Home Exercise Plan eval: table slides, A/ROM elbow, wrist, forearm    Consulted and Agree with Plan of Care Patient             Patient will benefit from skilled therapeutic intervention in order to improve the following deficits and impairments:   Body Structure / Function / Physical Skills: ADL, Endurance, UE functional use, Fascial restriction, Pain, ROM, IADL, Strength       Visit Diagnosis: Other symptoms and signs involving the musculoskeletal system  Acute pain of right shoulder  Stiffness of right shoulder, not elsewhere classified    Problem List  Patient Active Problem List   Diagnosis Date Noted   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   Fatty liver 09/30/2020   IBS (irritable bowel syndrome) 09/30/2020   Chronic cholecystitis    Radiculopathy due to lumbar intervertebral disc disorder 10/16/2019   Rectal leakage 06/17/2019   Abdominal pain 09/03/2018   Rectal pain 09/03/2018   Mild intermittent asthma, uncomplicated 33/53/3174   Adverse food reaction 04/24/2018   Perennial allergic rhinitis 04/24/2018   Allergic urticaria 04/24/2018   Gastroesophageal reflux disease    Hiatal hernia    Hematometra 10/02/2017   Pelvic pain in female 10/02/2017   Status post vaginal hysterectomy 10/02/2017   Rectal bleeding 07/26/2017   Rectocele, female 06/28/2017   Uterine prolapse 06/28/2017   Diarrhea 04/26/2017   Chronic bilateral low back pain without sciatica 02/15/2017   Dyspepsia 12/21/2016   Dysphagia 12/21/2016   Nausea without vomiting 12/21/2016   Chronic  migraine 07/20/2016   Neck pain 07/20/2016   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

## 2021-10-13 ENCOUNTER — Other Ambulatory Visit: Payer: Self-pay

## 2021-10-13 ENCOUNTER — Encounter (HOSPITAL_COMMUNITY): Payer: Self-pay

## 2021-10-13 ENCOUNTER — Ambulatory Visit (HOSPITAL_COMMUNITY): Payer: Medicaid Other

## 2021-10-13 DIAGNOSIS — R29898 Other symptoms and signs involving the musculoskeletal system: Secondary | ICD-10-CM

## 2021-10-13 DIAGNOSIS — M25611 Stiffness of right shoulder, not elsewhere classified: Secondary | ICD-10-CM

## 2021-10-13 DIAGNOSIS — M25511 Pain in right shoulder: Secondary | ICD-10-CM

## 2021-10-13 NOTE — Patient Instructions (Signed)

## 2021-10-13 NOTE — Therapy (Signed)
[facility] [address] [place], [place], [address] Phone: [phone]   Fax:  [phone]  Occupational Therapy Treatment  Patient Details  Name: Ruth Shah MRN: [id number] Date of Birth: 10/26/1975 Referring Provider (OT): Dr. Duwayne Heck   Encounter Date: 10/13/2021   OT End of Session - 10/13/21 1034     Visit Number 3    Number of Visits 24    Date for OT Re-Evaluation 12/29/21    Authorization Type [employer]    Authorization Time Period 12 visits approved 10/11/21-12/12/21    Authorization - Visit Number 2    Authorization - Number of Visits 12    OT Start Time [phone]    OT Stop Time 1025    OT Time Calculation (min) 38 min    Activity Tolerance Patient tolerated treatment well    Behavior During Therapy WFL for tasks assessed/performed             Past Medical History:  Diagnosis Date   ADD (attention deficit disorder)    Arthritis    Asthma    mild seasonal reactions to mold and mildew   Back pain    Depression    Family history of adverse reaction to anesthesia    mother has severe PONV   Fatty liver    Fibromyalgia    GERD (gastroesophageal reflux disease)    Herpes simplex disease    History of kidney stones    Hypertension    Migraines    Paresthesia of both hands    PONV (postoperative nausea and vomiting)    Urticaria     Past Surgical History:  Procedure Laterality Date   BIOPSY  09/23/2019   Procedure: BIOPSY;  Surgeon: West Bali, MD;  [facility];  Service: Endoscopy;;  esophagus   CHOLECYSTECTOMY N/A 04/08/2020   Procedure: LAPAROSCOPIC CHOLECYSTECTOMY;  Surgeon: Franky Macho, MD;  Location: [facility];  Service: General;  Laterality: N/A;   ENDOMETRIAL ABLATION     ESOPHAGEAL MANOMETRY N/A 10/15/2017   Procedure: ESOPHAGEAL MANOMETRY (EM);  Surgeon: Napoleon Form, MD;  [facility];  Service: Endoscopy;  Laterality: N/A;   ESOPHAGOGASTRODUODENOSCOPY  (EGD) WITH PROPOFOL N/A 12/26/2016   Dr. Darrick Penna: one benign-appearing, intrinsic stenosis that was widely patent and traversed. Small hiatal hernia, few small sessile polyps. Chronic gastritis. Normal duodenum, negative celiac sprue.    ESOPHAGOGASTRODUODENOSCOPY (EGD) WITH PROPOFOL N/A 09/23/2019   Procedure: ESOPHAGOGASTRODUODENOSCOPY (EGD) WITH PROPOFOL;  Surgeon: West Bali, MD;  [facility];  Service: Endoscopy;  Laterality: N/A;  11:00am   HERNIA REPAIR     inguinal as a baby    left ovarian removal due to cyst     PILONIDAL CYST / SINUS EXCISION  2009   RECTOCELE REPAIR N/A 10/02/2017   Procedure: POSTERIOR REPAIR (RECTOCELE);  Surgeon: Tilda Burrow, MD;  Location: [facility];  Service: Gynecology;  Laterality: N/A;   SAVORY DILATION N/A 09/23/2019   Procedure: SAVORY DILATION;  Surgeon: West Bali, MD;  [facility];  Service: Endoscopy;  Laterality: N/A;   SHOULDER ARTHROSCOPY WITH ROTATOR CUFF REPAIR Right 09/22/2021   Procedure: SHOULDER ARTHROSCOPY WITH ROTATOR CUFF REPAIR, BICEPS TENOTOMY, SUBACROMIAL DECOMPRESSION, DEBRIDEMENT;  Surgeon: Yolonda Kida, MD;  Location: [facility] OR;  Service: Orthopedics;  Laterality: Right;  120   SINOSCOPY     TUBAL LIGATION     VAGINAL HYSTERECTOMY N/A 10/02/2017   Procedure: HYSTERECTOMY VAGINAL;  Surgeon:  Tilda BurrowFerguson, John V, MD;  Location: [facility];  Service: Gynecology;  Laterality: N/A;    There were no vitals filed for this visit.   Subjective Assessment - 10/13/21 1000     Subjective  SL I'm just a little sore from the massage.    Currently in Pain? No/denies                [facility] Assessment - 10/13/21 1010       Assessment   Medical Diagnosis right shoulder scope RCR/biceps tenotomy/SD/debridement    Next MD Visit 11/15/21      Precautions   Precautions Shoulder    Type of Shoulder Precautions See media tab for protocol    Precaution Comments 1/18: Dr. Aundria Rudogers removed sling. Able to progress  ROM in therapy. No forceful ir or horizontal adduction.                      OT Treatments/Exercises (OP) - 10/13/21 1008       Exercises   Exercises Shoulder      Shoulder Exercises: Supine   Protraction PROM;5 reps;AAROM;10 reps    Horizontal ABduction PROM;5 reps;AAROM;10 reps    External Rotation PROM;5 reps;AAROM;10 reps    Internal Rotation PROM;5 reps;AAROM;10 reps    Flexion PROM;5 reps;AAROM;10 reps    ABduction PROM;5 reps;AAROM;10 reps      Shoulder Exercises: Standing   Protraction AAROM;10 reps    Horizontal ABduction AAROM;10 reps    External Rotation AAROM;10 reps    Internal Rotation AAROM;10 reps    Flexion AAROM;10 reps    ABduction AAROM;10 reps      Shoulder Exercises: ROM/Strengthening   Wall Wash 1'      Shoulder Exercises: Isometric Strengthening   Flexion Other (comment)   standing, 3x10"   Extension Other (comment)   standing, 3x10"   External Rotation Other (comment)   standing, 3x10"   Internal Rotation Other (comment)   standing, 3x10"   ABduction Other (comment)   standing, 3x10"   ADduction Other (comment)   standing, 3x10"     Manual Therapy   Manual Therapy Myofascial release    Manual therapy comments Manual therapy completed prior exercises.    Myofascial Release Myofascial release and manual stretching completed to right upper arm, upper trapezius, and scapularis region to decrease fascial restrictions and increase joint mobility in a pain free zone.                    OT Education - 10/13/21 1015     Education Details AA/ROM standing    Person(s) Educated Patient    Methods Explanation;Demonstration;Handout;Verbal cues    Comprehension Verbalized understanding;Returned demonstration              OT Short Term Goals - 10/13/21 1036       OT SHORT TERM GOAL #1   Title Pt will be provided with and educated on HEP to improve RUE mobility required for ADL completion while utilizing her RUE for daily  tasks at least 50% of the time.    Time 6    Period Weeks    Status On-going    Target Date 11/17/21      OT SHORT TERM GOAL #2   Title Patient will report a pain level of approximately 5/10 or less when completing basic ADL tasks using her RUE.    Time 6    Period Weeks    Status On-going  OT SHORT TERM GOAL #3   Title Patient will increase her RUE P/ROM to [facility] in order to increase ability to complete dressing and bathing tasks with less difficulty.    Time 6    Period Weeks    Status Achieved      OT SHORT TERM GOAL #4   Title Patient will increase her RUE strength to 3/5 in order to complete reaching tasks at or below her shoulder.    Time 6    Period Weeks    Status On-going      OT SHORT TERM GOAL #5   Title Pt will decrease RUE fascial restrictions to a moderate amount to improve ability to perform functional reaching tasks at home and work.    Time 6    Period Weeks    Status Achieved               OT Long Term Goals - 10/11/21 1015       OT LONG TERM GOAL #1   Title Patient will increase her RUE strength to 4+/5 in order to complete work tasks such as lifting dogs of moderate weight and moving items with less difficulty.    Time 12    Period Weeks    Status On-going    Target Date 12/29/21      OT LONG TERM GOAL #2   Title Patient will increase her RUE A/ROM to [facility] in order to complete all reaching tasks overhead and behind her back with less difficulty.    Time 12    Period Weeks    Status On-going      OT LONG TERM GOAL #3   Title Patient will report a pain level of approximately 2/10 or less when using her RUE as her dominant extremity at home and at work.    Time 12    Period Weeks    Status On-going      OT LONG TERM GOAL #4   Title Patient will decrease her RUE fascial restrictions to minimal amount or less in order to increase the functional mobility needed to complete reaching and lifting tasks.    Time 12    Period Weeks    Status  On-going                   Plan - 10/13/21 1034     Clinical Impression Statement A: Progressed patient to AA/ROM supine and standing as MD removed the sling and ok'd her to return to with restrictions. Completed myofascial release to address fascial restrictions in the right upper arm and upper trapezius. Continues to demonstrate functional to full passive and AA/ROM. Pt reports feeling soreness when completing isometrics for abduction and external rotation.    Body Structure / Function / Physical Skills ADL;Endurance;UE functional use;Fascial restriction;Pain;ROM;IADL;Strength    Plan P: Continue with AA/ROM. Add pulleys.    OT Home Exercise Plan eval: table slides, A/ROM elbow, wrist, forearm 1/19: AA/ROM    Consulted and Agree with Plan of Care Patient             Patient will benefit from skilled therapeutic intervention in order to improve the following deficits and impairments:   Body Structure / Function / Physical Skills: ADL, Endurance, UE functional use, Fascial restriction, Pain, ROM, IADL, Strength       Visit Diagnosis: Other symptoms and signs involving the musculoskeletal system  Acute pain of right shoulder  Stiffness of right shoulder, not elsewhere classified  Problem List Patient Active Problem List   Diagnosis Date Noted   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   Fatty liver 09/30/2020   IBS (irritable bowel syndrome) 09/30/2020   Chronic cholecystitis    Radiculopathy due to lumbar intervertebral disc disorder 10/16/2019   Rectal leakage 06/17/2019   Abdominal pain 09/03/2018   Rectal pain 09/03/2018   Mild intermittent asthma, uncomplicated 04/24/2018   Adverse food reaction 04/24/2018   Perennial allergic rhinitis 04/24/2018   Allergic urticaria 04/24/2018   Gastroesophageal reflux disease    Hiatal hernia    Hematometra 10/02/2017   Pelvic pain in female 10/02/2017   Status post vaginal hysterectomy 10/02/2017   Rectal bleeding 07/26/2017    Rectocele, female 06/28/2017   Uterine prolapse 06/28/2017   Diarrhea 04/26/2017   Chronic bilateral low back pain without sciatica 02/15/2017   Dyspepsia 12/21/2016   Dysphagia 12/21/2016   Nausea without vomiting 12/21/2016   Chronic migraine 07/20/2016   Neck pain 07/20/2016   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 [place], [place], [address] Phone: [phone]   Fax:  [phone]  Name: Ruth Shah MRN: [id number] Date of Birth: April 01, 1976

## 2021-10-15 IMAGING — RF DG ESOPHAGUS
13 series · 15 of 24 positions shown · non-contrast
Comparison: 07/31/2017

CLINICAL DATA: Dysphagia, food stuck in epigastric region for
sometime worsening over past month, prior history of upper endoscopy

EXAM:
ESOPHOGRAM / BARIUM SWALLOW / BARIUM TABLET STUDY
TECHNIQUE: Combined double contrast and single contrast examination performed
using effervescent crystals, thick barium liquid, and thin barium
liquid. The patient was observed with fluoroscopy swallowing a 13 mm
barium sulphate tablet.
FLUOROSCOPY TIME:  Fluoroscopy Time:  1 minutes 18 seconds
Radiation Exposure Index (if provided by the fluoroscopic device):
20.3 mGy
Number of Acquired Spot Images: multiple fluoroscopic screen
captures

[Series 1: cp_standard · 0.18mm/px · 1 of 130 frames shown (1 of 13)]
[frame 20/130]
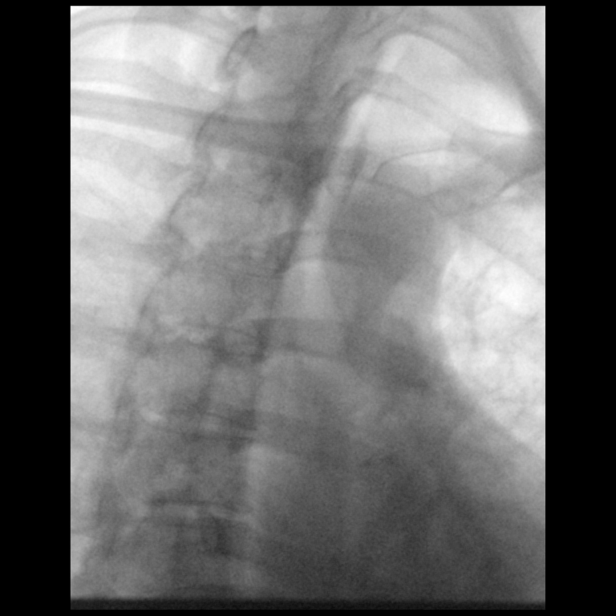

[Series 2: cp_standard · 0.18mm/px · 1 of 24 frames shown (2 of 13)]
[frame 4/24]
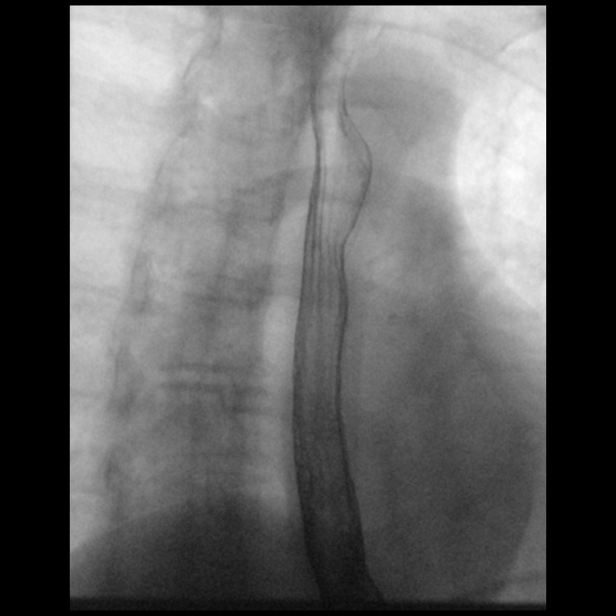

[Series 3: cp_standard · 0.18mm/px · 2 of 63 frames shown (3 of 13)]
[frame 15/63]
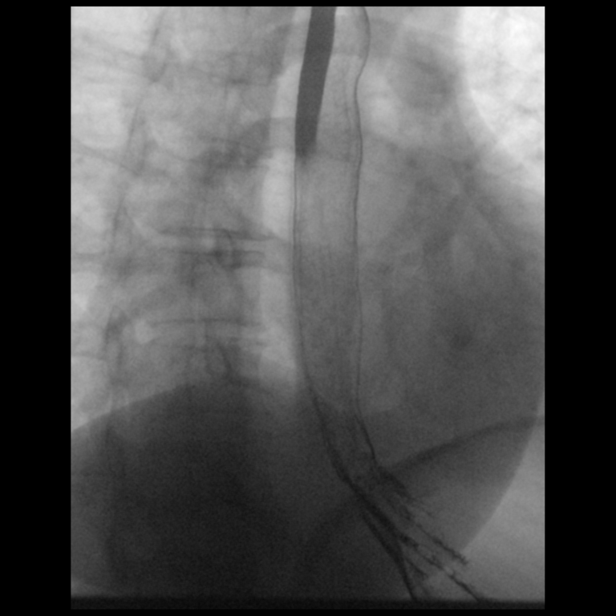
[frame 54/63]
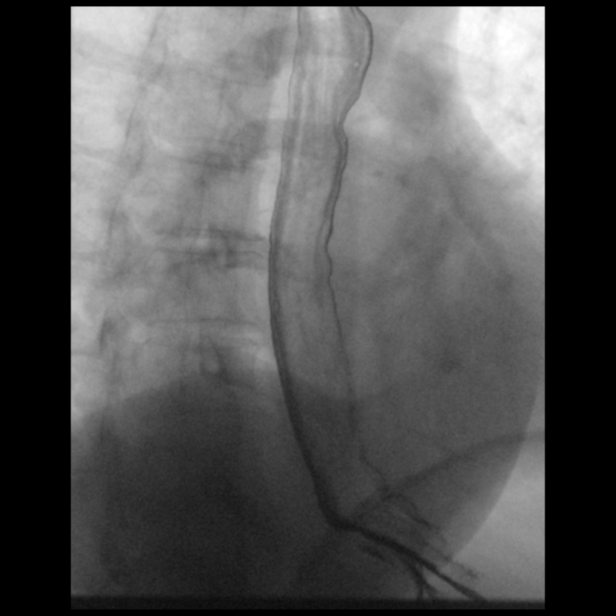

[Series 4: cp_standard · 0.18mm/px · 1 of 42 frames shown (4 of 13)]
[frame 36/42]
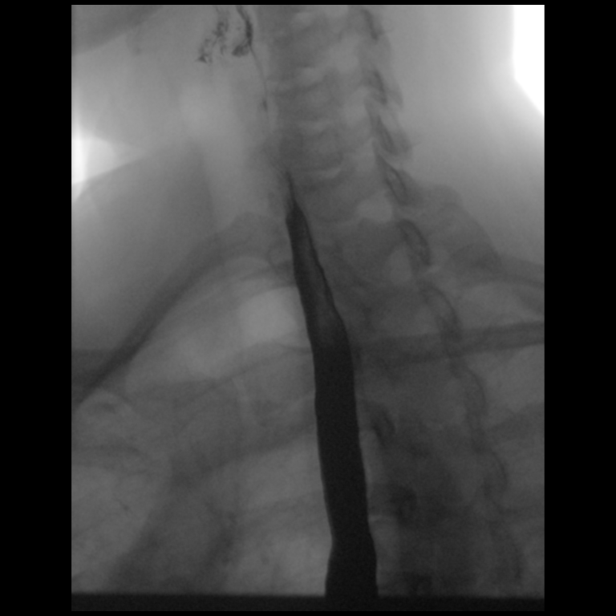

[Series 5: cp_standard · 0.18mm/px · 1 of 98 frames shown (5 of 13)]
[frame 16/98]
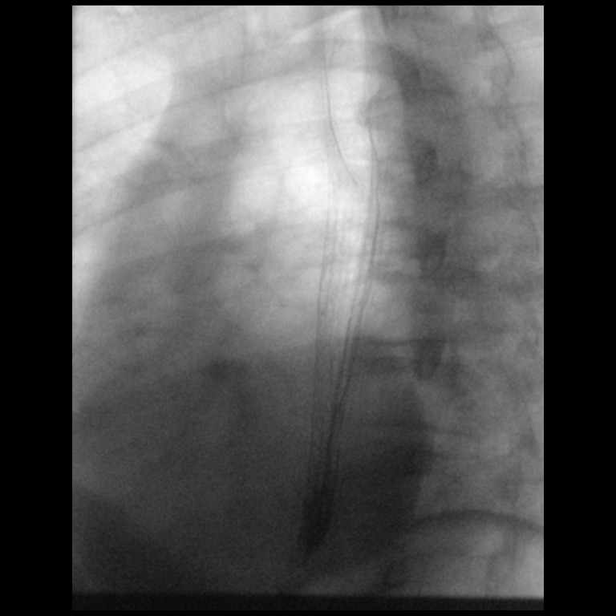

[Series 6: cp_standard · 0.28mm/px · 1 of 140 frames shown (6 of 13)]
[frame 71/140]
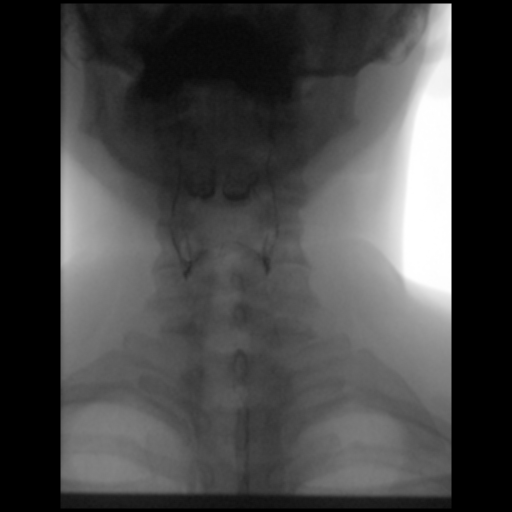

[Series 7: cp_standard · 0.26mm/px · 1 of 74 frames shown (7 of 13)]
[frame 38/74]
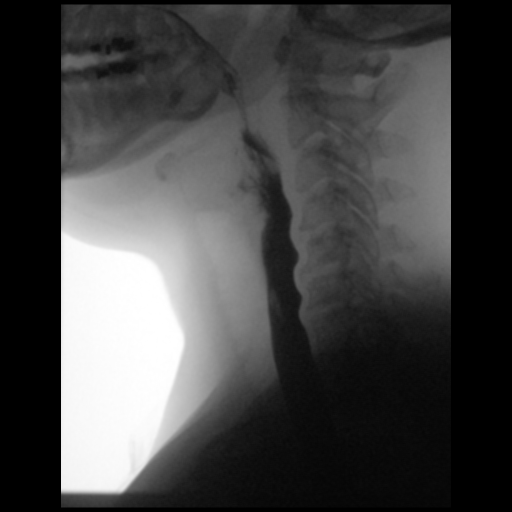

[Series 8: cp_standard · 0.19mm/px · 1 of 44 frames shown (8 of 13)]
[frame 7/44]
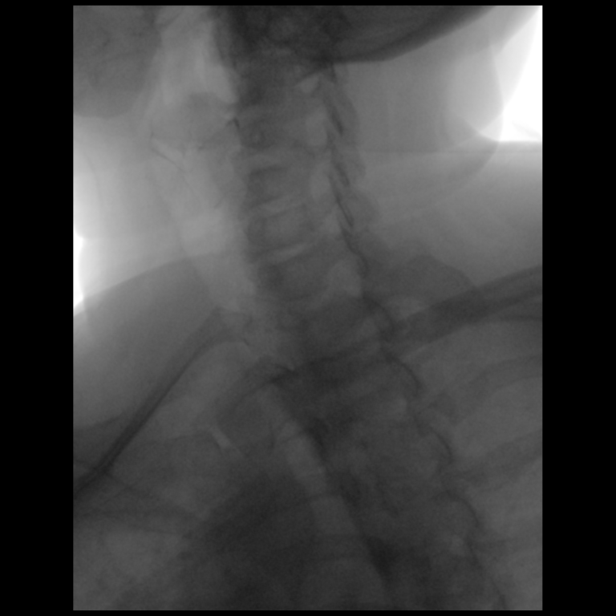

[Series 8: cp_standard · 0.19mm/px · 1 of 6 frames shown (9 of 13)]
[frame 4/6]
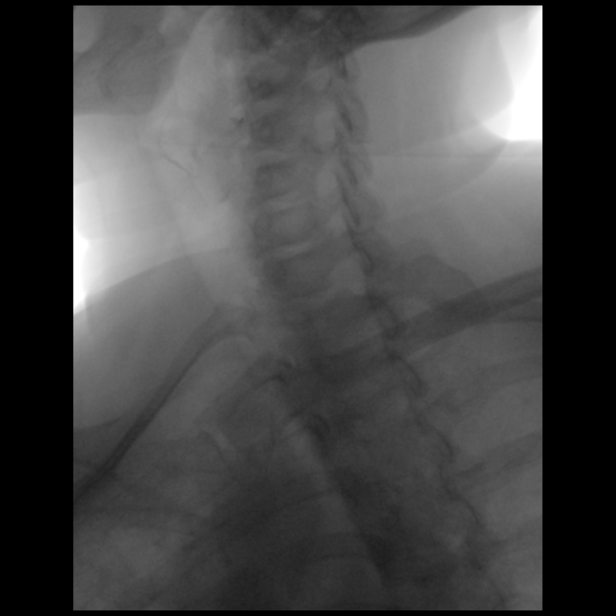

[Series 9: cp_standard · 0.19mm/px · 1 of 15 frames shown (10 of 13)]
[frame 3/15]
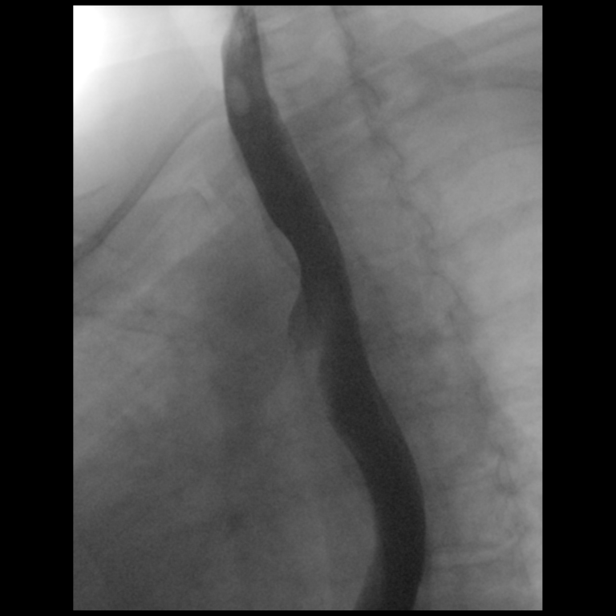

[Series 11: cp_standard · 0.19mm/px · 1 of 20 frames shown (11 of 13)]
[frame 4/20]
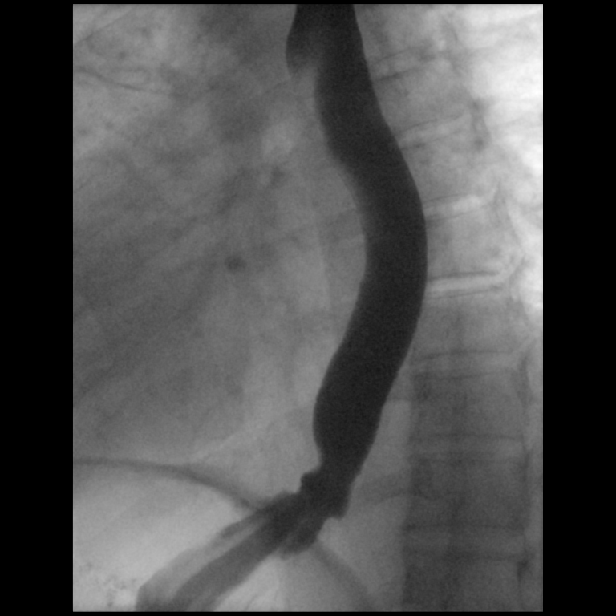

[Series 12: cp_standard · 0.19mm/px · 2 of 68 frames shown (12 of 13)]
[frame 11/68]
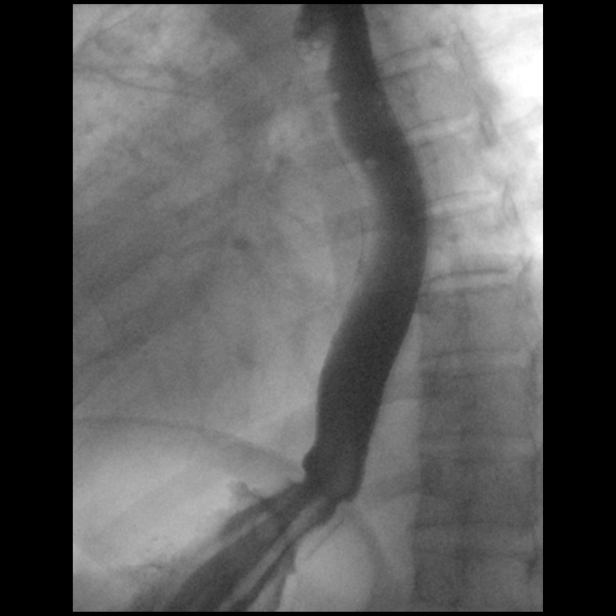
[frame 60/68]
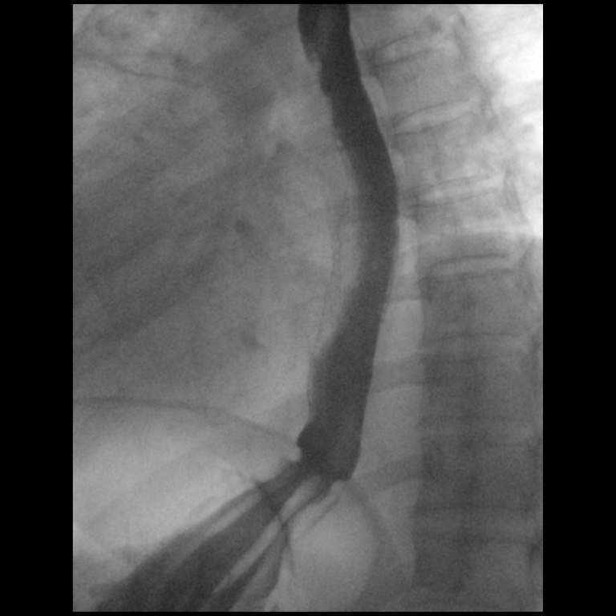

[Series 13: cp_standard · 0.19mm/px · 1 of 43 frames shown (13 of 13)]
[frame 37/43]
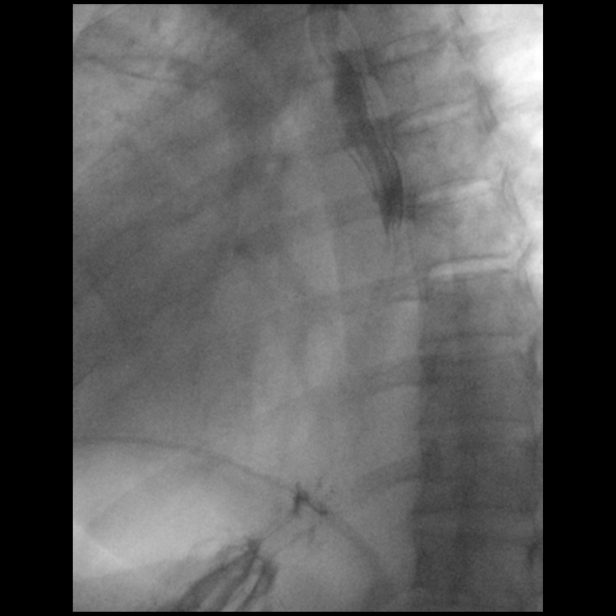

[15 of 24 positions shown; findings below may reference images not displayed]

FINDINGS: Esophageal distention: Normal distention without mass or stricture

Filling defects: Small non-obstructing Schatzki ring at GE junction,
unchanged.

12.5 mm barium tablet: Passed from oral cavity to stomach without
obstruction

Motility:  Normal

Mucosa:  Smooth without irregularity or ulceration

Hypopharynx/cervical esophagus: No laryngeal penetration, aspiration
or residuals

Hiatal hernia:  Tiny sliding hiatal hernia

GE reflux:  Not witnessed during exam

Other:  N/A
IMPRESSION: Small non-obstructing Schatzki ring at GE junction.

Tiny sliding hiatal hernia

Remainder of exam unremarkable.

Exam unchanged from prior study.

## 2021-10-18 ENCOUNTER — Ambulatory Visit (HOSPITAL_COMMUNITY): Payer: Medicaid Other

## 2021-10-18 ENCOUNTER — Encounter (HOSPITAL_COMMUNITY): Payer: Self-pay

## 2021-10-18 ENCOUNTER — Other Ambulatory Visit: Payer: Self-pay

## 2021-10-18 DIAGNOSIS — M25511 Pain in right shoulder: Secondary | ICD-10-CM

## 2021-10-18 DIAGNOSIS — R29898 Other symptoms and signs involving the musculoskeletal system: Secondary | ICD-10-CM

## 2021-10-18 DIAGNOSIS — M25611 Stiffness of right shoulder, not elsewhere classified: Secondary | ICD-10-CM

## 2021-10-18 NOTE — Therapy (Signed)
[facility] [address] [place], [place], [address] Phone: [phone]   Fax:  [phone]  Occupational Therapy Treatment  Patient Details  Name: Ruth RegalMisty W Bugbee MRN: [id number] Date of Birth: 06/24/1976 Referring Provider (OT): Dr. Duwayne HeckJason Rogers   Encounter Date: 10/18/2021   OT End of Session - 10/18/21 1315     Visit Number 4    Number of Visits 24    Date for OT Re-Evaluation 12/29/21    Authorization Type [employer]    Authorization Time Period 12 visits approved 10/11/21-12/12/21    Authorization - Visit Number 3    Authorization - Number of Visits 12    OT Start Time [id number]    OT Stop Time 1026    OT Time Calculation (min) 38 min    Activity Tolerance Patient tolerated treatment well    Behavior During Therapy WFL for tasks assessed/performed             Past Medical History:  Diagnosis Date   ADD (attention deficit disorder)    Arthritis    Asthma    mild seasonal reactions to mold and mildew   Back pain    Depression    Family history of adverse reaction to anesthesia    mother has severe PONV   Fatty liver    Fibromyalgia    GERD (gastroesophageal reflux disease)    Herpes simplex disease    History of kidney stones    Hypertension    Migraines    Paresthesia of both hands    PONV (postoperative nausea and vomiting)    Urticaria     Past Surgical History:  Procedure Laterality Date   BIOPSY  09/23/2019   Procedure: BIOPSY;  Surgeon: West BaliFields, Sandi L, MD;  [facility];  Service: Endoscopy;;  esophagus   CHOLECYSTECTOMY N/A 04/08/2020   Procedure: LAPAROSCOPIC CHOLECYSTECTOMY;  Surgeon: Franky MachoJenkins, Mark, MD;  Location: [facility];  Service: General;  Laterality: N/A;   ENDOMETRIAL ABLATION     ESOPHAGEAL MANOMETRY N/A 10/15/2017   Procedure: ESOPHAGEAL MANOMETRY (EM);  Surgeon: Napoleon FormNandigam, Kavitha V, MD;  [facility];  Service: Endoscopy;  Laterality: N/A;   ESOPHAGOGASTRODUODENOSCOPY  (EGD) WITH PROPOFOL N/A 12/26/2016   Dr. Darrick PennaFields: one benign-appearing, intrinsic stenosis that was widely patent and traversed. Small hiatal hernia, few small sessile polyps. Chronic gastritis. Normal duodenum, negative celiac sprue.    ESOPHAGOGASTRODUODENOSCOPY (EGD) WITH PROPOFOL N/A 09/23/2019   Procedure: ESOPHAGOGASTRODUODENOSCOPY (EGD) WITH PROPOFOL;  Surgeon: West BaliFields, Sandi L, MD;  [facility];  Service: Endoscopy;  Laterality: N/A;  11:00am   HERNIA REPAIR     inguinal as a baby    left ovarian removal due to cyst     PILONIDAL CYST / SINUS EXCISION  2009   RECTOCELE REPAIR N/A 10/02/2017   Procedure: POSTERIOR REPAIR (RECTOCELE);  Surgeon: Tilda BurrowFerguson, John V, MD;  Location: [facility];  Service: Gynecology;  Laterality: N/A;   SAVORY DILATION N/A 09/23/2019   Procedure: SAVORY DILATION;  Surgeon: West BaliFields, Sandi L, MD;  [facility];  Service: Endoscopy;  Laterality: N/A;   SHOULDER ARTHROSCOPY WITH ROTATOR CUFF REPAIR Right 09/22/2021   Procedure: SHOULDER ARTHROSCOPY WITH ROTATOR CUFF REPAIR, BICEPS TENOTOMY, SUBACROMIAL DECOMPRESSION, DEBRIDEMENT;  Surgeon: Yolonda Kidaogers, Jason Patrick, MD;  [facility];  Service: Orthopedics;  Laterality: Right;  120   SINOSCOPY     TUBAL LIGATION     VAGINAL HYSTERECTOMY N/A 10/02/2017   Procedure: HYSTERECTOMY VAGINAL;  Surgeon:  Tilda Burrow, MD;  Location: [facility];  Service: Gynecology;  Laterality: N/A;    There were no vitals filed for this visit.   Subjective Assessment - 10/18/21 1003     Subjective  S: Work went great. I didn't have any pain. 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    Currently in Pain? Yes    Pain Score 3     Pain Location Shoulder    Pain Orientation Right    Pain Descriptors / Indicators Sore    Pain Type Acute pain    Pain Onset Yesterday    Pain Frequency Intermittent    Aggravating Factors  doing too much. moving it the wrong way    Pain Relieving  Factors pain medication    Effect of Pain on Daily Activities min effect    Multiple Pain Sites No                [facility] OT Assessment - 10/18/21 1004       Assessment   Medical Diagnosis right shoulder scope RCR/biceps tenotomy/SD/debridement      Precautions   Precautions Shoulder    Type of Shoulder Precautions See media tab for protocol    Precaution Comments 1/18: Dr. Aundria Rud removed sling. Able to progress ROM in therapy. No forceful ir or horizontal adduction.                      OT Treatments/Exercises (OP) - 10/18/21 1004       Exercises   Exercises Shoulder      Shoulder Exercises: Supine   Protraction PROM;5 reps;AAROM;12 reps    Horizontal ABduction PROM;5 reps;AAROM;12 reps    External Rotation PROM;5 reps;AAROM;12 reps    Internal Rotation PROM;5 reps;AAROM;12 reps    Flexion PROM;5 reps;AAROM;12 reps    ABduction PROM;5 reps;AAROM;12 reps      Shoulder Exercises: ROM/Strengthening   Wall Wash 1'    Proximal Shoulder Strengthening, Supine A/ROM 10X no rest breaks      Shoulder Exercises: Isometric Strengthening   Flexion Other (comment)   3x10" standing   External Rotation Other (comment)   3x10" standing     Manual Therapy   Manual Therapy Myofascial release    Manual therapy comments Manual therapy completed prior exercises.    Myofascial Release Myofascial release and manual stretching completed to right upper arm, upper trapezius, and scapularis region to decrease fascial restrictions and increase joint mobility in a pain free zone.                      OT Short Term Goals - 10/13/21 1036       OT SHORT TERM GOAL #1   Title Pt will be provided with and educated on HEP to improve RUE mobility required for ADL completion while utilizing her RUE for daily tasks at least 50% of the time.    Time 6    Period Weeks    Status On-going    Target Date 11/17/21      OT SHORT TERM GOAL #2   Title Patient will report a pain  level of approximately 5/10 or less when completing basic ADL tasks using her RUE.    Time 6    Period Weeks    Status On-going      OT SHORT TERM GOAL #3   Title Patient will increase her RUE P/ROM to [facility] in  order to increase ability to complete dressing and bathing tasks with less difficulty.    Time 6    Period Weeks    Status Achieved      OT SHORT TERM GOAL #4   Title Patient will increase her RUE strength to 3/5 in order to complete reaching tasks at or below her shoulder.    Time 6    Period Weeks    Status On-going      OT SHORT TERM GOAL #5   Title Pt will decrease RUE fascial restrictions to a moderate amount to improve ability to perform functional reaching tasks at home and work.    Time 6    Period Weeks    Status Achieved               OT Long Term Goals - 10/11/21 1015       OT LONG TERM GOAL #1   Title Patient will increase her RUE strength to 4+/5 in order to complete work tasks such as lifting dogs of moderate weight and moving items with less difficulty.    Time 12    Period Weeks    Status On-going    Target Date 12/29/21      OT LONG TERM GOAL #2   Title Patient will increase her RUE A/ROM to [facility] in order to complete all reaching tasks overhead and behind her back with less difficulty.    Time 12    Period Weeks    Status On-going      OT LONG TERM GOAL #3   Title Patient will report a pain level of approximately 2/10 or less when using her RUE as her dominant extremity at home and at work.    Time 12    Period Weeks    Status On-going      OT LONG TERM GOAL #4   Title Patient will decrease her RUE fascial restrictions to minimal amount or less in order to increase the functional mobility needed to complete reaching and lifting tasks.    Time 12    Period Weeks    Status On-going                   Plan - 10/18/21 1316     Clinical Impression Statement A: Completed myofascial release to address minimal fascial restrictions.  Continued with AA/ROM while increasing repetitions to 12. VC form and technique were provided.    Body Structure / Function / Physical Skills ADL;Endurance;UE functional use;Fascial restriction;Pain;ROM;IADL;Strength    Plan P: Continue with AA/ROM.    Consulted and Agree with Plan of Care Patient             Patient will benefit from skilled therapeutic intervention in order to improve the following deficits and impairments:   Body Structure / Function / Physical Skills: ADL, Endurance, UE functional use, Fascial restriction, Pain, ROM, IADL, Strength       Visit Diagnosis: Other symptoms and signs involving the musculoskeletal system  Acute pain of right shoulder  Stiffness of right shoulder, not elsewhere classified    Problem List Patient Active Problem List   Diagnosis Date Noted   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   Fatty liver 09/30/2020   IBS (irritable bowel syndrome) 09/30/2020   Chronic cholecystitis    Radiculopathy due to lumbar intervertebral disc disorder 10/16/2019   Rectal leakage 06/17/2019   Abdominal pain 09/03/2018   Rectal pain 09/03/2018   Mild intermittent asthma, uncomplicated 04/24/2018   Adverse  food reaction 04/24/2018   Perennial allergic rhinitis 04/24/2018   Allergic urticaria 04/24/2018   Gastroesophageal reflux disease    Hiatal hernia    Hematometra 10/02/2017   Pelvic pain in female 10/02/2017   Status post vaginal hysterectomy 10/02/2017   Rectal bleeding 07/26/2017   Rectocele, female 06/28/2017   Uterine prolapse 06/28/2017   Diarrhea 04/26/2017   Chronic bilateral low back pain without sciatica 02/15/2017   Dyspepsia 12/21/2016   Dysphagia 12/21/2016   Nausea without vomiting 12/21/2016   Chronic migraine 07/20/2016   Neck pain 07/20/2016   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

## 2021-10-19 ENCOUNTER — Encounter: Payer: Self-pay | Admitting: Gastroenterology

## 2021-10-19 NOTE — Progress Notes (Signed)
Referring Provider: Rory Percy, MD Primary Care Physician:  [facility] Primary GI Physician: Dr. Abbey Chatters  Chief Complaint  Patient presents with   loose stool   Gastroesophageal Reflux    Ok as long as she takes med.   Abdominal Pain    Right side abd. When has pain stool is more watery and greasy. Sometimes stool is light yellow. Has fatty liver. Sometimes has stool leakage. Mother has IBS    HPI:   Ruth Shah is a 46 y.o. female with history of IBS, chronic right sided abdominal/rib pain, fatty liver, GERD, presenting today for follow-up.  Last seen in our office January 2022.  GERD was well controlled with Protonix twice daily.  Abdominal pain okay unless doing more activity than she should.  Cholecystectomy did not change her pain.  Pain was located on the right side around the lower rib.  She also has fibromyalgia and chronic pain.  Also reported Bristol 5-6 stools with leakage with occasional IBS flares with lower abdominal cramping/pain with diarrhea at that time.  Was taking probiotics at times which did help with her stools.  Also noted rectal spasms occurring as frequent as 2-3 times a week and as little as once every 2 to 3 months if standing or sitting too long, lasting 3 to 5 minutes.  Protonix was continued.  She is started on Bentyl 10 mg 4 times daily as needed.  Offered compounded rectal cream with diltiazem and lidocaine, but patient preferred to hold off.  Overall, suspect that abdominal pain was musculoskeletal.   Today:  GERD: Doing well as long as she takes her mediations.  Currently taking Protonix 40 mg BID.  If she forgets to take her medicine for 2 days, she will have a recurrent symptoms.  No dysphagia.   IBS: More loose stools that "normal" stools.  Most days she has Bristol 5-6 stools that are postprandial.  Stools can look with fatty or have a yellowish tent at times.  Intermittently, she will have watery stool.  This seems to occur when  she has right-sided abdominal discomfort.  This is a chronic issue for her, but has been a little more frequent over the last 6+ months.  Occurs 1 or 2 times a month.  Cannot identify any triggers.  States sometimes she can eat certain foods such as pizza or drink milk and has no problem, other days it will cause looser stools.  Rare nocturnal stools.  No recent antibiotics.  No BRBPR or melena.  No unintentional weight loss.    Cannot remember if she ever took Bentyl.  Drinks Milk every night.  Doesn't eat a lot of greasy foods. Out to eat once a week, [place] or steak house (grilled chicken with peppers and onions, fries).    Needs first-ever colonoscopy.  BP is quite elevated today.  She is asymptomatic.  Has not taken her blood pressure medications yet this morning as she takes them when she eats.  States her primary care doctor is trying to get her blood pressure under good control.  When she takes her blood pressure medications, blood pressure improves, but by the end of the day, blood pressure is back up.  States PCP will likely be adding another medication.   Lactose free diet.  Fatty liver diet.  Decrease to once daily. Cholestyramine  bentyl  Past Medical History:  Diagnosis Date   ADD (attention deficit disorder)    Arthritis    Asthma  mild seasonal reactions to mold and mildew   Back pain    Depression    Family history of adverse reaction to anesthesia    mother has severe PONV   Fatty liver    Fibromyalgia    GERD (gastroesophageal reflux disease)    Herpes simplex disease    History of kidney stones    Hypertension    Migraines    Paresthesia of both hands    PONV (postoperative nausea and vomiting)    Urticaria     Past Surgical History:  Procedure Laterality Date   BIOPSY  09/23/2019   Procedure: BIOPSY;  Surgeon: Danie Binder, MD;  [facility];  Service: Endoscopy;;  esophagus   CHOLECYSTECTOMY N/A 04/08/2020   Procedure: LAPAROSCOPIC  CHOLECYSTECTOMY;  Surgeon: Aviva Signs, MD;  Location: [facility];  Service: General;  Laterality: N/A;   ENDOMETRIAL ABLATION     ESOPHAGEAL MANOMETRY N/A 10/15/2017   Procedure: ESOPHAGEAL MANOMETRY (EM);  Surgeon: Mauri Pole, MD;  [facility];  Service: Endoscopy;  Laterality: N/A;   ESOPHAGOGASTRODUODENOSCOPY (EGD) WITH PROPOFOL N/A 12/26/2016   Dr. Oneida Alar: one benign-appearing, intrinsic stenosis that was widely patent and traversed. Small hiatal hernia, few small sessile polyps. Chronic gastritis. Normal duodenum, negative celiac sprue.    ESOPHAGOGASTRODUODENOSCOPY (EGD) WITH PROPOFOL N/A 09/23/2019   Surgeon: Danie Binder, MD;  1 benign-appearing esophageal stenosis s/p dilation, esophagus was biopsied, normal examined stomach and duodenum.  Pathology with mild reactive changes, no increased intraepithelial eosinophils.   HERNIA REPAIR     inguinal as a baby    left ovarian removal due to cyst     PILONIDAL CYST / SINUS EXCISION  2009   RECTOCELE REPAIR N/A 10/02/2017   Procedure: POSTERIOR REPAIR (RECTOCELE);  Surgeon: Jonnie Kind, MD;  [facility]: [facility];  Service: Gynecology;  Laterality: N/A;   SAVORY DILATION N/A 09/23/2019   Procedure: SAVORY DILATION;  Surgeon: Danie Binder, MD;  [facility];  Service: Endoscopy;  Laterality: N/A;   SHOULDER ARTHROSCOPY WITH ROTATOR CUFF REPAIR Right 09/22/2021   Procedure: SHOULDER ARTHROSCOPY WITH ROTATOR CUFF REPAIR, BICEPS TENOTOMY, SUBACROMIAL DECOMPRESSION, DEBRIDEMENT;  Surgeon: Nicholes Stairs, MD;  Location: [place];  Service: Orthopedics;  Laterality: Right;  120   SINOSCOPY     TUBAL LIGATION     VAGINAL HYSTERECTOMY N/A 10/02/2017   Procedure: HYSTERECTOMY VAGINAL;  Surgeon: Jonnie Kind, MD;  [facility];  Service: Gynecology;  Laterality: N/A;    Current Outpatient Medications  Medication Sig Dispense Refill   Cholecalciferol (VITAMIN D3) 125 MCG (5000 UT) CAPS Take 5,000 Units  by mouth daily.     cholestyramine (QUESTRAN) 4 g packet Take 1 packet (4 g total) by mouth daily. Take other medications at least 1 hour before or at least 4 to 6 hours after cholestyramine 30 each 3   dicyclomine (BENTYL) 10 MG capsule Take 1 capsule (10 mg total) by mouth 4 (four) times daily as needed for abdominal cramping and diarrhea. 90 capsule 1   hydrochlorothiazide (HYDRODIURIL) 12.5 MG tablet Take 12.5 mg by mouth daily.     ibuprofen (ADVIL) 200 MG tablet Take 400 mg by mouth every 6 (six) hours as needed for headache or moderate pain.     methylphenidate 18 MG PO CR tablet Take 18 mg by mouth daily.     ondansetron (ZOFRAN-ODT) 4 MG disintegrating tablet Take 1 tablet (4 mg total) by mouth every 8 (eight) hours as  needed for nausea or vomiting. 20 tablet 0   pantoprazole (PROTONIX) 40 MG tablet Take 1 tablet (40 mg total) by mouth 2 (two) times daily before a meal. 180 tablet 1   propranolol ER (INDERAL LA) 80 MG 24 hr capsule Take 80 mg by mouth at bedtime.      TURMERIC CURCUMIN PO Take 1 capsule by mouth daily.     vitamin C (ASCORBIC ACID) 500 MG tablet Take 500 mg by mouth at bedtime.     Vitamin D-Vitamin K (VITAMIN K2-VITAMIN D3 PO) Take 1 tablet by mouth daily.     Current Facility-Administered Medications  Medication Dose Route Frequency Provider Last Rate Last Admin   methylPREDNISolone acetate (DEPO-MEDROL) injection 40 mg  40 mg Other Once Magnus Sinning, MD        Allergies as of 10/20/2021 - Review Complete 10/20/2021  Allergen Reaction Noted   Morphine and related Itching 09/22/2021   Dilaudid [hydromorphone hcl] Nausea And Vomiting 10/10/2017   Duloxetine Other (See Comments) 06/13/2018   Sulfa antibiotics Nausea Only 07/20/2016   Toradol [ketorolac tromethamine] Nausea Only 07/20/2016   Vicodin [hydrocodone-acetaminophen] Itching 08/12/2015    Family History  Problem Relation Age of Onset   Cancer Mother        skin   Depression Mother    Hypertension  Mother    Hyperlipidemia Mother    Diabetes Mother    Cancer Father        skin   Arthritis Father    Hypertension Father    Hyperlipidemia Father    Asthma Brother    Depression Maternal Grandmother    Hypertension Maternal Grandmother    Mental illness Maternal Grandmother    Cancer Maternal Grandfather        kidney   Hypertension Maternal Grandfather    Kidney disease Maternal Grandfather    Emphysema Paternal Grandmother    Alzheimer's disease Paternal Grandmother    Parkinson's disease Paternal Grandfather    Colon cancer Neg Hx    Colon polyps Neg Hx    Allergic rhinitis Neg Hx    Eczema Neg Hx    Urticaria Neg Hx     Social History   Socioeconomic History   Marital status: Married    Spouse name: Not on file   Number of children: 4   Years of education: Some College   Highest education level: Not on file  Occupational History   Not on file  Tobacco Use   Smoking status: Never   Smokeless tobacco: Never  Vaping Use   Vaping Use: Never used  Substance and Sexual Activity   Alcohol use: No    Alcohol/week: 0.0 standard drinks   Drug use: No   Sexual activity: Yes    Birth control/protection: Surgical    Comment: hyst  Other Topics Concern   Not on file  Social History Narrative   Lives at home with husband and children.   Right-handed.   4 cups caffeine per week.   Social Determinants of Health   Financial Resource Strain: Not on file  Food Insecurity: Not on file  Transportation Needs: Not on file  Physical Activity: Not on file  Stress: Not on file  Social Connections: Not on file    Review of Systems: Gen: Denies fever, chills, cold or flulike symptoms, presyncope, syncope. CV: Denies chest pain, palpitations. Resp: Denies dyspnea or cough.   GI: See HPI Heme: See HPI  Physical Exam: BP (!) 161/116    Pulse  78    Temp 98 F (36.7 C) (Temporal)    Ht 5\' 3"  (1.6 m)    Wt 189 lb (85.7 kg)    BMI 33.48 kg/m  General:   Alert and  oriented. No distress noted. Pleasant and cooperative.  Head:  Normocephalic and atraumatic. Eyes:  Conjuctiva clear without scleral icterus. Heart:  S1, S2 present without murmurs appreciated. Lungs:  Clear to auscultation bilaterally. No wheezes, rales, or rhonchi. No distress.  Abdomen:  +BS, soft, mild TTP in RUQ along lower ribs, mild TTP in RLQ. No rebound or guarding. No HSM or masses noted. Msk:  Symmetrical without gross deformities. Normal posture. Extremities:  Without edema. Neurologic:  Alert and  oriented x4 Psych:  Normal mood and affect.   Assessment:  46 y.o. female with history of IBS, chronic right sided abdominal/rib pain, fatty liver, GERD, presenting today for follow-up.  Diarrhea:  Chronic. Primarily with Bristol 5-6 stools postprandially with occasional flares of Bristol 7 stools with associated RUQ pain 1-2 times per month.  Denies BRBPR, melena, unintentional weight loss.  Rare nocturnal stools.  Symptoms likely multifactorial in the setting of IBS and bile salt diarrhea postcholecystectomy.  Query lactose intolerance as well. Cannot rule out underlying IBD, celiac disease.  Less likely infectious diarrhea or microscopic colitis.  TSH within normal limits in November 2022.  Needs first-ever colonoscopy which will also help rule out IBD or microscopic colitis, but we will hold off on this in the setting of uncontrolled HTN and start with treating suspected bile salt diarrhea as well as possible IBS with cholestyramine and dicyclomine. We will also screen for celiac disease.  Hope to schedule colonoscopy at her next visit.   GERD: Well-controlled on Protonix 40 mg twice daily.  No alarm symptoms.  As she has been on twice daily dosing for quite some time, we will try decreasing to once daily and see how she does.  If symptoms return 2-3 days/week, increase Protonix back to twice daily.   Fatty liver: Dates back to 2019, most recent ultrasound November 2022 again with  hepatic steatosis.  Recent LFTs in December 2022 within normal limits.  Discussed potential clinical course for fatty liver with risk of progression to cirrhosis.  Counseled on the importance of weight loss through diet and exercise.  HTN:  Uncontrolled.  BP quite elevated today as she has not taken her medications this morning.  She is asymptomatic.  She is following with PCP and states they are likely going to be starting a new medication as her blood pressure continues to be uncontrolled.  She will take her blood pressure medication when she gets home and notify her PCP of any ongoing issues.   Plan: TTG IgA, IgA Start cholestyramine 4 g daily.  Take other medications 1 hour before or 4-6 hours after cholestyramine. Use dicyclomine 10 mg up to 4 times daily for episodes of abdominal pain and increased loose stool. Low-fat diet. Trial lactose-free diet. Decrease Protonix to 40 mg once daily.  If breakthrough 2-3 times per week, increase back to twice daily. Reinforced GERD diet/lifestyle.  Separate written instructions provided. Counseled on fatty liver with the importance of weight loss through diet and exercise.  Separate written instructions provided. Follow-up in 3-4 months.  Call sooner if needed.   Aliene Altes, PA-C [facility] 10/20/2021

## 2021-10-20 ENCOUNTER — Ambulatory Visit (HOSPITAL_COMMUNITY): Payer: Medicaid Other

## 2021-10-20 ENCOUNTER — Ambulatory Visit: Payer: Medicaid Other | Admitting: Gastroenterology

## 2021-10-20 ENCOUNTER — Encounter: Payer: Self-pay | Admitting: Gastroenterology

## 2021-10-20 ENCOUNTER — Encounter (HOSPITAL_COMMUNITY): Payer: Self-pay

## 2021-10-20 ENCOUNTER — Other Ambulatory Visit: Payer: Self-pay

## 2021-10-20 VITALS — BP 161/116 | HR 78 | Temp 98.0°F | Ht 63.0 in | Wt 189.0 lb

## 2021-10-20 DIAGNOSIS — M25511 Pain in right shoulder: Secondary | ICD-10-CM

## 2021-10-20 DIAGNOSIS — K760 Fatty (change of) liver, not elsewhere classified: Secondary | ICD-10-CM | POA: Diagnosis not present

## 2021-10-20 DIAGNOSIS — R29898 Other symptoms and signs involving the musculoskeletal system: Secondary | ICD-10-CM

## 2021-10-20 DIAGNOSIS — R197 Diarrhea, unspecified: Secondary | ICD-10-CM | POA: Diagnosis not present

## 2021-10-20 DIAGNOSIS — K219 Gastro-esophageal reflux disease without esophagitis: Secondary | ICD-10-CM

## 2021-10-20 DIAGNOSIS — I10 Essential (primary) hypertension: Secondary | ICD-10-CM

## 2021-10-20 DIAGNOSIS — R1011 Right upper quadrant pain: Secondary | ICD-10-CM

## 2021-10-20 DIAGNOSIS — M25611 Stiffness of right shoulder, not elsewhere classified: Secondary | ICD-10-CM

## 2021-10-20 MED ORDER — CHOLESTYRAMINE 4 G PO PACK
4.0000 g | PACK | Freq: Every day | ORAL | 3 refills | Status: DC
Start: 2021-10-20 — End: 2022-06-26

## 2021-10-20 MED ORDER — DICYCLOMINE HCL 10 MG PO CAPS: ORAL_CAPSULE | ORAL | 1 refills | Status: DC

## 2021-10-20 NOTE — Therapy (Signed)
[place], [place], [address] Phone: [phone]   Fax:  [phone]  Occupational Therapy Treatment  Patient Details  Name: Ruth Shah MRN: [id number] Date of Birth: May 04, 1976 Referring Provider (OT): Dr. Victorino December   Encounter Date: 10/20/2021   OT End of Session - 10/20/21 1117     Visit Number 5    Number of Visits 24    Date for OT Re-Evaluation 12/29/21   mini reassess: 11/03/21   Authorization Type [employer]    Authorization Time Period 12 visits approved 10/11/21-12/12/21    Authorization - Visit Number 4    Authorization - Number of Visits 12    OT Start Time [id number]    OT Stop Time [id number]    OT Time Calculation (min) 38 min    Activity Tolerance Patient tolerated treatment well    Behavior During Therapy WFL for tasks assessed/performed             Past Medical History:  Diagnosis Date   ADD (attention deficit disorder)    Arthritis    Asthma    mild seasonal reactions to mold and mildew   Back pain    Depression    Family history of adverse reaction to anesthesia    mother has severe PONV   Fatty liver    Fibromyalgia    GERD (gastroesophageal reflux disease)    Herpes simplex disease    History of kidney stones    Hypertension    Migraines    Paresthesia of both hands    PONV (postoperative nausea and vomiting)    Urticaria     Past Surgical History:  Procedure Laterality Date   BIOPSY  09/23/2019   Procedure: BIOPSY;  Surgeon: Danie Binder, MD;  [facility];  Service: Endoscopy;;  esophagus   CHOLECYSTECTOMY N/A 04/08/2020   Procedure: LAPAROSCOPIC CHOLECYSTECTOMY;  Surgeon: Aviva Signs, MD;  [facility];  Service: General;  Laterality: N/A;   ENDOMETRIAL ABLATION     ESOPHAGEAL MANOMETRY N/A 10/15/2017   Procedure: ESOPHAGEAL MANOMETRY (EM);  Surgeon: Mauri Pole, MD;  [facility];  Service: Endoscopy;  Laterality: N/A;    ESOPHAGOGASTRODUODENOSCOPY (EGD) WITH PROPOFOL N/A 12/26/2016   Dr. Oneida Alar: one benign-appearing, intrinsic stenosis that was widely patent and traversed. Small hiatal hernia, few small sessile polyps. Chronic gastritis. Normal duodenum, negative celiac sprue.    ESOPHAGOGASTRODUODENOSCOPY (EGD) WITH PROPOFOL N/A 09/23/2019   Surgeon: Danie Binder, MD;  1 benign-appearing esophageal stenosis s/p dilation, esophagus was biopsied, normal examined stomach and duodenum.  Pathology with mild reactive changes, no increased intraepithelial eosinophils.   HERNIA REPAIR     inguinal as a baby    left ovarian removal due to cyst     PILONIDAL CYST / SINUS EXCISION  2009   RECTOCELE REPAIR N/A 10/02/2017   Procedure: POSTERIOR REPAIR (RECTOCELE);  Surgeon: Jonnie Kind, MD;  [facility];  Service: Gynecology;  Laterality: N/A;   SAVORY DILATION N/A 09/23/2019   Procedure: SAVORY DILATION;  Surgeon: Danie Binder, MD;  [facility];  Service: Endoscopy;  Laterality: N/A;   SHOULDER ARTHROSCOPY WITH ROTATOR CUFF REPAIR Right 09/22/2021   Procedure: SHOULDER ARTHROSCOPY WITH ROTATOR CUFF REPAIR, BICEPS TENOTOMY, SUBACROMIAL DECOMPRESSION, DEBRIDEMENT;  Surgeon: Nicholes Stairs, MD;  Location: [place];  Service: Orthopedics;  Laterality: Right;  [place]  HYSTERECTOMY N/A 10/02/2017   Procedure: HYSTERECTOMY VAGINAL;  Surgeon: Tilda BurrowFerguson, John V, MD;  Location[facility];  Service: Gynecology;  Laterality: N/A;    There were no vitals filed for this visit.   Subjective Assessment - 10/20/21 1121     Currently in Pain? Yes    Pain Score 3     Pain Location Shoulder    Pain Orientation Right    Pain Descriptors / Indicators Sore    Pain Type Acute pain    Pain Onset In the past 7 days    Pain Frequency Intermittent    Aggravating Factors  doing too much. moving in the wrong way    Pain Relieving Factors pain medication    Effect of Pain on  Daily Activities min effect    Multiple Pain Sites No                [facility] OT Assessment - 10/20/21 1120       Assessment   Medical Diagnosis right shoulder scope RCR/biceps tenotomy/SD/debridement      Precautions   Precautions Shoulder    Type of Shoulder Precautions See media tab for protocol    Precaution Comments 1/18: Dr. Aundria Rudogers removed sling. Able to progress ROM in therapy. No forceful ir or horizontal adduction.                      OT Treatments/Exercises (OP) - 10/20/21 1121       Exercises   Exercises Shoulder      Shoulder Exercises: Supine   Protraction PROM;5 reps;AAROM;12 reps    Horizontal ABduction PROM;5 reps;AAROM;12 reps    External Rotation PROM;5 reps;AAROM;12 reps    Internal Rotation PROM;5 reps;AAROM;12 reps    Flexion PROM;5 reps;AAROM;12 reps    ABduction PROM;5 reps;AAROM;12 reps      Shoulder Exercises: Standing   Protraction AAROM;12 reps    Horizontal ABduction AAROM;12 reps    External Rotation AAROM;12 reps    Internal Rotation AAROM;12 reps    Flexion AAROM;12 reps    ABduction AAROM;12 reps      Shoulder Exercises: Pulleys   Flexion 1 minute   standing   ABduction 1 minute   standing     Shoulder Exercises: ROM/Strengthening   Wall Wash 1'    Proximal Shoulder Strengthening, Supine A/ROM 10X no rest breaks      Shoulder Exercises: Isometric Strengthening   Flexion --   3x10" standing   Extension --   3x10" standing   External Rotation --   3x10" standing   Internal Rotation Other (comment)   3x10" standing   ABduction Other (comment)   3x10" standing     Manual Therapy   Manual Therapy Myofascial release    Manual therapy comments Manual therapy completed prior exercises.    Myofascial Release Myofascial release and manual stretching completed to right upper arm, upper trapezius, and scapularis region to decrease fascial restrictions and increase joint mobility in a pain free zone.                       OT Short Term Goals - 10/20/21 1232       OT SHORT TERM GOAL #1   Title Pt will be provided with and educated on HEP to improve RUE mobility required for ADL completion while utilizing her RUE for daily tasks at least 50% of the time.    Time 6    Period Weeks  Status On-going    Target Date 11/17/21      OT SHORT TERM GOAL #2   Title Patient will report a pain level of approximately 5/10 or less when completing basic ADL tasks using her RUE.    Time 6    Period Weeks    Status On-going      OT SHORT TERM GOAL #3   Title Patient will increase her RUE P/ROM to [facility] in order to increase ability to complete dressing and bathing tasks with less difficulty.    Time 6    Period Weeks      OT SHORT TERM GOAL #4   Title Patient will increase her RUE strength to 3/5 in order to complete reaching tasks at or below her shoulder.    Time 6    Period Weeks    Status On-going      OT SHORT TERM GOAL #5   Title Pt will decrease RUE fascial restrictions to a moderate amount to improve ability to perform functional reaching tasks at home and work.    Time 6    Period Weeks               OT Long Term Goals - 10/11/21 1015       OT LONG TERM GOAL #1   Title Patient will increase her RUE strength to 4+/5 in order to complete work tasks such as lifting dogs of moderate weight and moving items with less difficulty.    Time 12    Period Weeks    Status On-going    Target Date 12/29/21      OT LONG TERM GOAL #2   Title Patient will increase her RUE A/ROM to [facility] in order to complete all reaching tasks overhead and behind her back with less difficulty.    Time 12    Period Weeks    Status On-going      OT LONG TERM GOAL #3   Title Patient will report a pain level of approximately 2/10 or less when using her RUE as her dominant extremity at home and at work.    Time 12    Period Weeks    Status On-going      OT LONG TERM GOAL #4   Title Patient will decrease  her RUE fascial restrictions to minimal amount or less in order to increase the functional mobility needed to complete reaching and lifting tasks.    Time 12    Period Weeks    Status On-going                   Plan - 10/20/21 1230     Clinical Impression Statement A: Completed myofascial release to address minimal fascial restrictions. Continues with AA/ROM completing 12 repetitions as patient reports noted soreness after previous session. Pullys and wall wash were completed. Provided VC for form and technique during session as needed.    Body Structure / Function / Physical Skills ADL;Endurance;UE functional use;Fascial restriction;Pain;ROM;IADL;Strength    Plan P: PVC pipe slide.    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   Fatty liver 09/30/2020   IBS (irritable bowel syndrome) 09/30/2020   Chronic cholecystitis    Radiculopathy due to lumbar intervertebral disc disorder 10/16/2019   Rectal leakage 06/17/2019   Abdominal pain 09/03/2018   Rectal pain 09/03/2018   Mild intermittent asthma, uncomplicated [id number]   Adverse food reaction 04/24/2018   Perennial allergic rhinitis 04/24/2018   Allergic urticaria 04/24/2018   Gastroesophageal reflux disease    Hiatal hernia    Hematometra 10/02/2017   Pelvic pain in female 10/02/2017   Status post vaginal hysterectomy 10/02/2017   Rectal bleeding 07/26/2017   Rectocele, female  06/28/2017   Uterine prolapse 06/28/2017   Diarrhea 04/26/2017   Chronic bilateral low back pain without sciatica 02/15/2017   Dyspepsia 12/21/2016   Dysphagia 12/21/2016   Nausea without vomiting 12/21/2016   Chronic migraine 07/20/2016   Neck pain 07/20/2016   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 [address] [place], [place], [address] Phone: [phone]   Fax:  [phone]  Name: JOIA TATIS MRN: [id number] Date of Birth: [birth year]

## 2021-10-20 NOTE — Patient Instructions (Addendum)
Please have blood work completed at [facility].  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  Start cholestyramine 4 g daily.  Take other medications at least 1 hour before or 4 to 6 hours after cholestyramine.  You may also take dicyclomine up to 4 times daily for your episodes of abdominal pain and increased loose stools.  Follow a low-fat diet. Avoid fried, fatty, greasy foods. All meats should be lean (poultry or fish) and baked, boiled, or broiled.  Follow a lactose-free diet for 2 weeks to see if you have any improvement in your symptoms.  If you do, this suggest that you also have lactose intolerance.  You could try taking Lactaid tablets prior to dairy consumption.  For reflux: Try decreasing Protonix to 40 mg once daily.  If you have return of reflux symptoms 2-3 times per week, increase Protonix back to twice daily. GERD diet/lifestyle recommendations: Avoid fried, fatty, greasy, spicy, citrus foods. Avoid caffeine and carbonated beverages. Avoid chocolate. Try eating 4-6 small meals a day rather than 3 large meals. Do not eat within 3 hours of laying down. Prop head of bed up on wood or bricks to create a 6 inch incline.  Instructions for fatty liver: Recommend 1-2# weight loss per week until ideal body weight through exercise & diet. Low fat/cholesterol diet.   Avoid sweets, sodas, fruit juices, sweetened beverages like tea, etc. Gradually increase exercise from 15 min daily up to 1 hr per day 5 days/week. Limit alcohol use.   It was a pleasure meeting you today!  We will plan to see you back in 3 to 4 months.  Please call sooner if you have questions or concerns.  Ermalinda Memos, PA-C [facility]

## 2021-10-21 LAB — TISSUE TRANSGLUTAMINASE, IGA: (tTG) Ab, IgA: <1 U/mL

## 2021-10-21 LAB — IGA: Immunoglobulin A: 161 mg/dL (ref 47–310)

## 2021-10-25 ENCOUNTER — Encounter (HOSPITAL_COMMUNITY): Payer: Self-pay

## 2021-10-25 ENCOUNTER — Ambulatory Visit (HOSPITAL_COMMUNITY): Payer: Medicaid Other

## 2021-10-25 ENCOUNTER — Other Ambulatory Visit: Payer: Self-pay

## 2021-10-25 DIAGNOSIS — R29898 Other symptoms and signs involving the musculoskeletal system: Secondary | ICD-10-CM | POA: Diagnosis not present

## 2021-10-25 DIAGNOSIS — M25611 Stiffness of right shoulder, not elsewhere classified: Secondary | ICD-10-CM

## 2021-10-25 DIAGNOSIS — M25511 Pain in right shoulder: Secondary | ICD-10-CM

## 2021-10-25 NOTE — Therapy (Signed)
[facility] [address] [place], [place], [address] Phone: [phone]   Fax:  [phone]  Occupational Therapy Treatment  Patient Details  Name: Ruth RegalMisty W Shah MRN: [id number] Date of Birth: [birth year] Referring Provider (OT): Dr. Duwayne HeckJason Rogers   Encounter Date: 10/25/2021   OT End of Session - 10/25/21 1202     Visit Number 6    Number of Visits 24    Date for OT Re-Evaluation 12/29/21   mini reassess: 11/03/21   Authorization Type [employer]    Authorization Time Period 12 visits approved 10/11/21-12/12/21    Authorization - Visit Number 5    Authorization - Number of Visits 12    OT Start Time 1115    OT Stop Time 1153    OT Time Calculation (min) 38 min    Activity Tolerance Patient tolerated treatment well    Behavior During Therapy WFL for tasks assessed/performed             Past Medical History:  Diagnosis Date   ADD (attention deficit disorder)    Arthritis    Asthma    mild seasonal reactions to mold and mildew   Back pain    Depression    Family history of adverse reaction to anesthesia    mother has severe PONV   Fatty liver    Fibromyalgia    GERD (gastroesophageal reflux disease)    Herpes simplex disease    History of kidney stones    Hypertension    Migraines    Paresthesia of both hands    PONV (postoperative nausea and vomiting)    Urticaria     Past Surgical History:  Procedure Laterality Date   BIOPSY  09/23/2019   Procedure: BIOPSY;  Surgeon: West BaliFields, Sandi L, MD;  Location: [facility];  Service: Endoscopy;;  esophagus   CHOLECYSTECTOMY N/A 04/08/2020   Procedure: LAPAROSCOPIC CHOLECYSTECTOMY;  Surgeon: Franky MachoJenkins, Mark, MD;  Location: [facility];  Service: General;  Laterality: N/A;   ENDOMETRIAL ABLATION     ESOPHAGEAL MANOMETRY N/A 10/15/2017   Procedure: ESOPHAGEAL MANOMETRY (EM);  Surgeon: Napoleon FormNandigam, Kavitha V, MD;  Location: [facility];  Service: Endoscopy;  Laterality: N/A;    ESOPHAGOGASTRODUODENOSCOPY (EGD) WITH PROPOFOL N/A 12/26/2016   Dr. Darrick PennaFields: one benign-appearing, intrinsic stenosis that was widely patent and traversed. Small hiatal hernia, few small sessile polyps. Chronic gastritis. Normal duodenum, negative celiac sprue.    ESOPHAGOGASTRODUODENOSCOPY (EGD) WITH PROPOFOL N/A 09/23/2019   Surgeon: West BaliFields, Sandi L, MD;  1 benign-appearing esophageal stenosis s/p dilation, esophagus was biopsied, normal examined stomach and duodenum.  Pathology with mild reactive changes, no increased intraepithelial eosinophils.   HERNIA REPAIR     inguinal as a baby    left ovarian removal due to cyst     PILONIDAL CYST / SINUS EXCISION  2009   RECTOCELE REPAIR N/A 10/02/2017   Procedure: POSTERIOR REPAIR (RECTOCELE);  Surgeon: Tilda BurrowFerguson, John V, MD;  Location: [facility];  Service: Gynecology;  Laterality: N/A;   SAVORY DILATION N/A 09/23/2019   Procedure: SAVORY DILATION;  Surgeon: West BaliFields, Sandi L, MD;  Location: [facility];  Service: Endoscopy;  Laterality: N/A;   SHOULDER ARTHROSCOPY WITH ROTATOR CUFF REPAIR Right 09/22/2021   Procedure: SHOULDER ARTHROSCOPY WITH ROTATOR CUFF REPAIR, BICEPS TENOTOMY, SUBACROMIAL DECOMPRESSION, DEBRIDEMENT;  Surgeon: Yolonda Kidaogers, Jason Patrick, MD;  [facility];  Service: Orthopedics;  Laterality: Right;  120   SINOSCOPY     TUBAL LIGATION     VAGINAL  HYSTERECTOMY N/A 10/02/2017   Procedure: HYSTERECTOMY VAGINAL;  Surgeon: Tilda Burrow, MD;  Location: [facility];  Service: Gynecology;  Laterality: N/A;    There were no vitals filed for this visit.   Subjective Assessment - 10/25/21 1118     Currently in Pain? Yes    Pain Score 3     Pain Location Shoulder    Pain Orientation Right    Pain Descriptors / Indicators Sore    Pain Type Acute pain    Pain Onset In the past 7 days    Pain Frequency Intermittent    Aggravating Factors  doing too much, moving in the wrong way    Pain Relieving Factors pain medication    Effect of Pain on  Daily Activities min effect    Multiple Pain Sites No                [facility] OT Assessment - 10/25/21 1328       Assessment   Medical Diagnosis right shoulder scope RCR/biceps tenotomy/SD/debridement      Precautions   Precautions Shoulder    Type of Shoulder Precautions See media tab for protocol    Precaution Comments 1/18: Dr. Aundria Rud removed sling. Able to progress ROM in therapy. No forceful ir or horizontal adduction.                      OT Treatments/Exercises (OP) - 10/25/21 1142       Exercises   Exercises Shoulder      Shoulder Exercises: Supine   Protraction PROM;5 reps;AROM;12 reps    Horizontal ABduction PROM;5 reps;AAROM;10 reps;AROM;15 reps    External Rotation PROM;5 reps;AROM;12 reps    Internal Rotation PROM;5 reps;AROM;12 reps    Flexion PROM;5 reps;AAROM;15 reps;AROM;12 reps    ABduction PROM;5 reps;AROM;12 reps      Shoulder Exercises: Standing   Protraction AROM;10 reps    Horizontal ABduction AROM;10 reps    External Rotation AROM;10 reps    Internal Rotation AROM;10 reps    Flexion AROM;10 reps    ABduction AROM;10 reps    Extension Theraband;10 reps    Theraband Level (Shoulder Extension) Level 2 (Red)    Row Theraband;10 reps    Theraband Level (Shoulder Row) Level 2 (Red)    Retraction Theraband;10 reps    Theraband Level (Shoulder Retraction) Level 2 (Red)      Shoulder Exercises: ROM/Strengthening   Proximal Shoulder Strengthening, Seated A/ROM 12X no rest breaks      Manual Therapy   Manual Therapy Myofascial release    Manual therapy comments Manual therapy completed prior exercises.    Myofascial Release Myofascial release and manual stretching completed to right upper arm, upper trapezius, and scapularis region to decrease fascial restrictions and increase joint mobility in a pain free zone.                    OT Education - 10/25/21 1139     Education Details A/ROM standing    Person(s) Educated  Patient    Methods Explanation;Demonstration;Handout;Verbal cues    Comprehension Verbalized understanding;Returned demonstration              OT Short Term Goals - 10/20/21 1232       OT SHORT TERM GOAL #1   Title Pt will be provided with and educated on HEP to improve RUE mobility required for ADL completion while utilizing her RUE for daily tasks at least 50% of the time.  Time 6    Period Weeks    Status On-going    Target Date 11/17/21      OT SHORT TERM GOAL #2   Title Patient will report a pain level of approximately 5/10 or less when completing basic ADL tasks using her RUE.    Time 6    Period Weeks    Status On-going      OT SHORT TERM GOAL #3   Title Patient will increase her RUE P/ROM to [facility] in order to increase ability to complete dressing and bathing tasks with less difficulty.    Time 6    Period Weeks      OT SHORT TERM GOAL #4   Title Patient will increase her RUE strength to 3/5 in order to complete reaching tasks at or below her shoulder.    Time 6    Period Weeks    Status On-going      OT SHORT TERM GOAL #5   Title Pt will decrease RUE fascial restrictions to a moderate amount to improve ability to perform functional reaching tasks at home and work.    Time 6    Period Weeks               OT Long Term Goals - 10/11/21 1015       OT LONG TERM GOAL #1   Title Patient will increase her RUE strength to 4+/5 in order to complete work tasks such as lifting dogs of moderate weight and moving items with less difficulty.    Time 12    Period Weeks    Status On-going    Target Date 12/29/21      OT LONG TERM GOAL #2   Title Patient will increase her RUE A/ROM to [facility] in order to complete all reaching tasks overhead and behind her back with less difficulty.    Time 12    Period Weeks    Status On-going      OT LONG TERM GOAL #3   Title Patient will report a pain level of approximately 2/10 or less when using her RUE as her dominant  extremity at home and at work.    Time 12    Period Weeks    Status On-going      OT LONG TERM GOAL #4   Title Patient will decrease her RUE fascial restrictions to minimal amount or less in order to increase the functional mobility needed to complete reaching and lifting tasks.    Time 12    Period Weeks    Status On-going                   Plan - 10/25/21 1202     Clinical Impression Statement A: Trace fascial resrtrictions noted this date in the right upper arm and upper trapezius region. Patient was able to demonstrate full P/ROM with some muscle tightness noted with external rotation. Progressed to A/ROM supine and standing while demonstrating close to full ROM. Added scapular strengthening and UBE with VC for form and technique.    Body Structure / Function / Physical Skills ADL;Endurance;UE functional use;Fascial restriction;Pain;ROM;IADL;Strength    Plan P: D/C myofascial release and manual stretching. Add shoulder flexion stretch. Follow up on HEP.    OT Home Exercise Plan eval: table slides, A/ROM elbow, wrist, forearm 1/19: AA/ROM 1/31: A/ROM    Consulted and Agree with Plan of Care Patient  Patient will benefit from skilled therapeutic intervention in order to improve the following deficits and impairments:   Body Structure / Function / Physical Skills: ADL, Endurance, UE functional use, Fascial restriction, Pain, ROM, IADL, Strength       Visit Diagnosis: Other symptoms and signs involving the musculoskeletal system  Acute pain of right shoulder  Stiffness of right shoulder, not elsewhere classified    Problem List Patient Active Problem List   Diagnosis Date Noted   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   Fatty liver 09/30/2020   IBS (irritable bowel syndrome) 09/30/2020   Chronic cholecystitis    Radiculopathy due to lumbar intervertebral disc disorder 10/16/2019   Rectal leakage 06/17/2019   Abdominal pain 09/03/2018   Rectal pain  09/03/2018   Mild intermittent asthma, uncomplicated 04/24/2018   Adverse food reaction 04/24/2018   Perennial allergic rhinitis 04/24/2018   Allergic urticaria 04/24/2018   Gastroesophageal reflux disease    Hiatal hernia    Hematometra 10/02/2017   Pelvic pain in female 10/02/2017   Status post vaginal hysterectomy 10/02/2017   Rectal bleeding 07/26/2017   Rectocele, female 06/28/2017   Uterine prolapse 06/28/2017   Diarrhea 04/26/2017   Chronic bilateral low back pain without sciatica 02/15/2017   Dyspepsia 12/21/2016   Dysphagia 12/21/2016   Nausea without vomiting 12/21/2016   Chronic migraine 07/20/2016   Neck pain 07/20/2016   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

## 2021-10-25 NOTE — Patient Instructions (Signed)
Repeat all exercises 10-15 times, 1-2 times per day.  1) Shoulder Protraction    Begin with elbows by your side, slowly "punch" straight out in front of you.      2) Shoulder Flexion  Standing:         Begin with arms at your side with thumbs pointed up, slowly raise both arms up and forward towards overhead.       3) Horizontal abduction/adduction    Standing:           Begin with arms straight out in front of you, bring out to the side in at "T" shape. Keep arms straight entire time.          4) Internal & External Rotation  Standing:     Stand with elbows at the side and elbows bent 90 degrees. Move your forearms away from your body, then bring back inward toward the body.     5) Shoulder Abduction   Standing:       Begin with your arms next to your side. Slowly move your arms out to the side so that they go overhead, in a jumping jack or snow angel movement.    6) X to V arms (cheerleader move):  Begin with arms straight down, crossed in front of body in an "X". Keeping arms crossed, lift arms straight up overhead. Then spread arms apart into a "V" shape.  Bring back together into x and lower down to starting position.         

## 2021-10-27 ENCOUNTER — Encounter (HOSPITAL_COMMUNITY): Payer: Medicaid Other | Admitting: Occupational Therapy

## 2021-11-01 ENCOUNTER — Encounter (HOSPITAL_COMMUNITY): Payer: Medicaid Other

## 2021-11-08 ENCOUNTER — Telehealth (HOSPITAL_COMMUNITY): Payer: Self-pay

## 2021-11-08 ENCOUNTER — Encounter (HOSPITAL_COMMUNITY): Payer: Medicaid Other

## 2021-11-08 NOTE — Telephone Encounter (Signed)
Called and left a voicemail regarding no show for today's OT appointment. Reminder left for next appointment and to call if unable to make it. Also informed patient that there was availability today if she wanted to reschedule her missed appointment.   Ailene Ravel, OTR/L,CBIS  [phone]

## 2021-11-10 ENCOUNTER — Ambulatory Visit (HOSPITAL_COMMUNITY): Payer: Medicaid Other | Attending: Orthopedic Surgery

## 2021-11-10 ENCOUNTER — Other Ambulatory Visit: Payer: Self-pay

## 2021-11-10 ENCOUNTER — Encounter (HOSPITAL_COMMUNITY): Payer: Self-pay

## 2021-11-10 DIAGNOSIS — M25511 Pain in right shoulder: Secondary | ICD-10-CM | POA: Insufficient documentation

## 2021-11-10 DIAGNOSIS — M25611 Stiffness of right shoulder, not elsewhere classified: Secondary | ICD-10-CM | POA: Diagnosis present

## 2021-11-10 DIAGNOSIS — G8929 Other chronic pain: Secondary | ICD-10-CM | POA: Insufficient documentation

## 2021-11-10 NOTE — Therapy (Signed)
[place], [place], [address] Phone: [phone]   Fax:  [phone]  Occupational Therapy Treatment Reassessment/discharge Patient Details  Name: Ruth Shah MRN: [id number] Date of Birth: Jan 16, 1976 Referring Provider (OT): Dr. Victorino December   Encounter Date: 11/10/2021   OT End of Session - 11/10/21 1105     Visit Number 7    Number of Visits 24    Authorization Type [employer]    Authorization Time Period 12 visits approved 10/11/21-12/12/21    Authorization - Visit Number 6    Authorization - Number of Visits 12    OT Start Time 1034   checked in late. Reassess/discharge   OT Stop Time 1104    OT Time Calculation (min) 30 min    Activity Tolerance Patient tolerated treatment well    Behavior During Therapy WFL for tasks assessed/performed             Past Medical History:  Diagnosis Date   ADD (attention deficit disorder)    Arthritis    Asthma    mild seasonal reactions to mold and mildew   Back pain    Depression    Family history of adverse reaction to anesthesia    mother has severe PONV   Fatty liver    Fibromyalgia    GERD (gastroesophageal reflux disease)    Herpes simplex disease    History of kidney stones    Hypertension    Migraines    Paresthesia of both hands    PONV (postoperative nausea and vomiting)    Urticaria     Past Surgical History:  Procedure Laterality Date   BIOPSY  09/23/2019   Procedure: BIOPSY;  Surgeon: Danie Binder, MD;  [facility];  Service: Endoscopy;;  esophagus   CHOLECYSTECTOMY N/A 04/08/2020   Procedure: LAPAROSCOPIC CHOLECYSTECTOMY;  Surgeon: Aviva Signs, MD;  Location: [facility];  Service: General;  Laterality: N/A;   ENDOMETRIAL ABLATION     ESOPHAGEAL MANOMETRY N/A 10/15/2017   Procedure: ESOPHAGEAL MANOMETRY (EM);  Surgeon: Mauri Pole, MD;  [facility];  Service: Endoscopy;  Laterality: N/A;    ESOPHAGOGASTRODUODENOSCOPY (EGD) WITH PROPOFOL N/A 12/26/2016   Dr. Oneida Alar: one benign-appearing, intrinsic stenosis that was widely patent and traversed. Small hiatal hernia, few small sessile polyps. Chronic gastritis. Normal duodenum, negative celiac sprue.    ESOPHAGOGASTRODUODENOSCOPY (EGD) WITH PROPOFOL N/A 09/23/2019   Surgeon: Danie Binder, MD;  1 benign-appearing esophageal stenosis s/p dilation, esophagus was biopsied, normal examined stomach and duodenum.  Pathology with mild reactive changes, no increased intraepithelial eosinophils.   HERNIA REPAIR     inguinal as a baby    left ovarian removal due to cyst     PILONIDAL CYST / SINUS EXCISION  2009   RECTOCELE REPAIR N/A 10/02/2017   Procedure: POSTERIOR REPAIR (RECTOCELE);  Surgeon: Jonnie Kind, MD;  Location: [facility];  Service: Gynecology;  Laterality: N/A;   SAVORY DILATION N/A 09/23/2019   Procedure: SAVORY DILATION;  Surgeon: Danie Binder, MD;  [facility];  Service: Endoscopy;  Laterality: N/A;   SHOULDER ARTHROSCOPY WITH ROTATOR CUFF REPAIR Right 09/22/2021   Procedure: SHOULDER ARTHROSCOPY WITH ROTATOR CUFF REPAIR, BICEPS TENOTOMY, SUBACROMIAL DECOMPRESSION, DEBRIDEMENT;  Surgeon: Nicholes Stairs, MD;  Location: [facility];  Service: Orthopedics;  Laterality: Right;  Early N/A 10/02/2017   Procedure: HYSTERECTOMY  VAGINAL;  Surgeon: Jonnie Kind, MD;  Location[facility];  Service: Gynecology;  Laterality: N/A;    There were no vitals filed for this visit.   Subjective Assessment - 11/10/21 1038     Subjective  S: I was doing great and then yesterday I was handling a dog and my arm got jerked.    Currently in Pain? Yes    Pain Score 3     Pain Location Shoulder    Pain Orientation Right    Pain Descriptors / Indicators Sore    Pain Type Acute pain    Pain Onset Yesterday    Pain Frequency Constant    Aggravating Factors  handled a dog  yesterday and it got jerked a little bit    Pain Relieving Factors nothing    Effect of Pain on Daily Activities min effect    Multiple Pain Sites No                [facility] OT Assessment - 11/10/21 1040       Assessment   Medical Diagnosis right shoulder scope RCR/biceps tenotomy/SD/debridement      Precautions   Precautions Shoulder    Type of Shoulder Precautions See media tab for protocol    Precaution Comments 1/18: Dr. Stann Mainland removed sling. Able to progress ROM in therapy. No forceful ir or horizontal adduction.      ROM / Strength   AROM / PROM / Strength AROM;PROM;Strength      AROM   Overall AROM Comments Assessed seated, er/IR adducted. A/ROM not assessed prior to this date.    AROM Assessment Site Shoulder    Right/Left Shoulder Right    Right Shoulder Flexion 160 Degrees    Right Shoulder ABduction 175 Degrees    Right Shoulder Internal Rotation 90 Degrees    Right Shoulder External Rotation 85 Degrees      PROM   Overall PROM Comments Assessed supine. IR/er adducted    PROM Assessment Site Shoulder    Right/Left Shoulder Right    Right Shoulder Flexion 180 Degrees   previous: 92   Right Shoulder ABduction 180 Degrees   previous: 145   Right Shoulder Internal Rotation 90 Degrees   previous: same   Right Shoulder External Rotation 86 Degrees   previous: 75     Strength   Overall Strength Comments Assessed seated, er/IR adducted. Strength not assess prior to this date.    Strength Assessment Site Shoulder    Right/Left Shoulder Right    Right Shoulder Flexion 5/5    Right Shoulder ABduction 5/5    Right Shoulder Internal Rotation 5/5    Right Shoulder External Rotation 5/5                             UEFI - 11/10/21 1051     UEFI Total Score 78             OT Education - 11/10/21 1104     Education Details strengthening with red theraband. Begin on March 3rd unless MD clears strengthening sooner.    Person(s) Educated  Patient    Methods Explanation;Handout    Comprehension Verbalized understanding              OT Short Term Goals - 11/10/21 1112       OT SHORT TERM GOAL #1   Title Pt will be provided with and educated on HEP to improve RUE  mobility required for ADL completion while utilizing her RUE for daily tasks at least 50% of the time.    Time 6    Period Weeks    Status Achieved    Target Date 11/17/21      OT SHORT TERM GOAL #2   Title Patient will report a pain level of approximately 5/10 or less when completing basic ADL tasks using her RUE.    Time 6    Period Weeks    Status Achieved      OT SHORT TERM GOAL #3   Title Patient will increase her RUE P/ROM to [facility] in order to increase ability to complete dressing and bathing tasks with less difficulty.    Time 6    Period Weeks    Status Achieved      OT SHORT TERM GOAL #4   Title Patient will increase her RUE strength to 3/5 in order to complete reaching tasks at or below her shoulder.    Time 6    Period Weeks    Status Achieved      OT SHORT TERM GOAL #5   Title Pt will decrease RUE fascial restrictions to a moderate amount to improve ability to perform functional reaching tasks at home and work.    Time 6    Period Weeks    Status Achieved               OT Long Term Goals - 11/10/21 1113       OT LONG TERM GOAL #1   Title Patient will increase her RUE strength to 4+/5 in order to complete work tasks such as lifting dogs of moderate weight and moving items with less difficulty.    Time 12    Period Weeks    Status Achieved    Target Date 12/29/21      OT LONG TERM GOAL #2   Title Patient will increase her RUE A/ROM to [facility] in order to complete all reaching tasks overhead and behind her back with less difficulty.    Time 12    Period Weeks    Status Achieved      OT LONG TERM GOAL #3   Title Patient will report a pain level of approximately 2/10 or less when using her RUE as her dominant extremity at home  and at work.    Time 12    Period Weeks    Status Achieved      OT LONG TERM GOAL #4   Title Patient will decrease her RUE fascial restrictions to minimal amount or less in order to increase the functional mobility needed to complete reaching and lifting tasks.    Time 12    Period Weeks    Status Achieved                   Plan - 11/10/21 1106     Clinical Impression Statement A: Reassessment completed this date. Patient is demonstrating full passive and active ROM and 5/5 shoulder strength in all ranges. UEFI score increased to 78 from 6 at evaluaton. All therapy goals have been met (short and long term). Experiencing occassional pain or soreness if she experiences a sudden movement (jerking) or if sleeping on it for too long. Reviewed HEP and provided shoulder strengthening exercises to begin on March 3rd (9 weeks) or sooner if MD clears it. No scheduled appointment with the MD is set up right now. She will call office today and schedule  one with Dr. Stann Mainland.    Body Structure / Function / Physical Skills ADL;Endurance;UE functional use;Fascial restriction;Pain;ROM;IADL;Strength    Plan P: D/C from OT services with HEP.    OT Home Exercise Plan eval: table slides, A/ROM elbow, wrist, forearm 1/19: AA/ROM 1/31: A/ROM 2/16: red band - strengthening    Consulted and Agree with Plan of Care Patient             Patient will benefit from skilled therapeutic intervention in order to improve the following deficits and impairments:   Body Structure / Function / Physical Skills: ADL, Endurance, UE functional use, Fascial restriction, Pain, ROM, IADL, Strength       Visit Diagnosis: Acute pain of right shoulder  Stiffness of right shoulder, not elsewhere classified  Chronic right shoulder pain    Problem List Patient Active Problem List   Diagnosis Date Noted   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   Fatty liver 09/30/2020   IBS (irritable bowel syndrome) 09/30/2020   Chronic  cholecystitis    Radiculopathy due to lumbar intervertebral disc disorder 10/16/2019   Rectal leakage 06/17/2019   Abdominal pain 09/03/2018   Rectal pain 09/03/2018   Mild intermittent asthma, uncomplicated 69/48/5462   Adverse food reaction 04/24/2018   Perennial allergic rhinitis 04/24/2018   Allergic urticaria 04/24/2018   Gastroesophageal reflux disease    Hiatal hernia    Hematometra 10/02/2017   Pelvic pain in female 10/02/2017   Status post vaginal hysterectomy 10/02/2017   Rectal bleeding 07/26/2017   Rectocele, female 06/28/2017   Uterine prolapse 06/28/2017   Diarrhea 04/26/2017   Chronic bilateral low back pain without sciatica 02/15/2017   Dyspepsia 12/21/2016   Dysphagia 12/21/2016   Nausea without vomiting 12/21/2016   Chronic migraine 07/20/2016   Neck pain 07/20/2016   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 Patient goals were met. Patient is being discharged due to meeting the stated rehab goals.Ailene Ravel, OTR/L,CBIS  [phone]  11/10/2021, 11:16 AM  [place], [place], [address] Phone: [phone]   Fax:  [phone]  Name: Ruth Shah MRN: [id number] Date of Birth: [birth year]

## 2021-11-10 NOTE — Patient Instructions (Signed)
1) Strengthening: Chest Pull - Resisted   Hold Theraband in front of body with hands about shoulder width a part. Pull band a part and back together slowly. Repeat _10-15___ times. Complete __1__ set(s) per session.. Repeat ____ session(s) per day.  http://orth.exer.us/926   Copyright  VHI. All rights reserved.   2) PNF Strengthening: Resisted   Standing with resistive band around each hand, bring right arm up and away, thumb back. Repeat __10-15__ times per set. Do __1__ sets per session. Do ____ sessions per day.             3) Resisted External Rotation: in Neutral - Bilateral   Sit or stand, tubing in both hands, elbows at sides, bent to 90, forearms forward. Pinch shoulder blades together and rotate forearms out. Keep elbows at sides. Repeat _10-15___ times per set. Do ___1_ sets per session. Do ____ sessions per day.  http://orth.exer.us/966   Copyright  VHI. All rights reserved.   4) PNF Strengthening: Resisted   Standing, hold resistive band above head. Bring right arm down and out from side. Repeat _10-15___ times per set. Do __1__ sets per session. Do ____ sessions per day.  http://orth.exer.us/922   Copyright  VHI. All rights reserved.

## 2021-11-15 ENCOUNTER — Encounter (HOSPITAL_COMMUNITY): Payer: Medicaid Other | Admitting: Occupational Therapy

## 2021-11-17 ENCOUNTER — Encounter (HOSPITAL_COMMUNITY): Payer: Medicaid Other

## 2021-11-22 ENCOUNTER — Encounter (HOSPITAL_COMMUNITY): Payer: Medicaid Other | Admitting: Occupational Therapy

## 2021-11-24 ENCOUNTER — Encounter (HOSPITAL_COMMUNITY): Payer: Medicaid Other | Admitting: Occupational Therapy

## 2021-11-29 ENCOUNTER — Encounter (HOSPITAL_COMMUNITY): Payer: Medicaid Other

## 2021-12-01 ENCOUNTER — Encounter (HOSPITAL_COMMUNITY): Payer: Medicaid Other | Admitting: Occupational Therapy

## 2021-12-06 ENCOUNTER — Encounter (HOSPITAL_COMMUNITY): Payer: Medicaid Other

## 2021-12-08 ENCOUNTER — Encounter (HOSPITAL_COMMUNITY): Payer: Medicaid Other | Admitting: Occupational Therapy

## 2022-01-05 ENCOUNTER — Encounter: Payer: Self-pay | Admitting: Internal Medicine

## 2022-05-15 ENCOUNTER — Ambulatory Visit (HOSPITAL_COMMUNITY): Payer: Medicaid Other | Attending: Orthopedic Surgery | Admitting: Physical Therapy

## 2022-06-12 ENCOUNTER — Encounter (HOSPITAL_COMMUNITY): Payer: Self-pay | Admitting: Physical Therapy

## 2022-06-12 ENCOUNTER — Ambulatory Visit (HOSPITAL_COMMUNITY): Payer: Medicaid Other | Attending: Orthopedic Surgery | Admitting: Physical Therapy

## 2022-06-12 DIAGNOSIS — M542 Cervicalgia: Secondary | ICD-10-CM | POA: Insufficient documentation

## 2022-06-12 NOTE — Therapy (Signed)
OUTPATIENT PHYSICAL THERAPY CERVICAL EVALUATION   Patient Name: Ruth Shah MRN: [id number] DOB:25-Aug-1976, 46 y.o., female Today's Date: 06/12/2022   PT End of Session - 06/12/22 0933     Visit Number 1    Number of Visits 9    Date for PT Re-Evaluation 07/24/22    Authorization Type Medicaid [employer]    Authorization Time Period Submitted for 8 total visits over 6 week period (check auth)    Authorization - Visit Number 1    Authorization - Number of Visits 1    PT Start Time 0910    PT Stop Time 0940    PT Time Calculation (min) 30 min    Activity Tolerance Patient tolerated treatment well    Behavior During Therapy WFL for tasks assessed/performed             Past Medical History:  Diagnosis Date   ADD (attention deficit disorder)    Arthritis    Asthma    mild seasonal reactions to mold and mildew   Back pain    Depression    Family history of adverse reaction to anesthesia    mother has severe PONV   Fatty liver    Fibromyalgia    GERD (gastroesophageal reflux disease)    Herpes simplex disease    History of kidney stones    Hypertension    Migraines    Paresthesia of both hands    PONV (postoperative nausea and vomiting)    Urticaria    Past Surgical History:  Procedure Laterality Date   BIOPSY  09/23/2019   Procedure: BIOPSY;  Surgeon: Danie Binder, MD;  [facility];  Service: Endoscopy;;  esophagus   CHOLECYSTECTOMY N/A 04/08/2020   Procedure: LAPAROSCOPIC CHOLECYSTECTOMY;  Surgeon: Aviva Signs, MD;  Location: [facility];  Service: General;  Laterality: N/A;   ENDOMETRIAL ABLATION     ESOPHAGEAL MANOMETRY N/A 10/15/2017   Procedure: ESOPHAGEAL MANOMETRY (EM);  Surgeon: Mauri Pole, MD;  [facility];  Service: Endoscopy;  Laterality: N/A;   ESOPHAGOGASTRODUODENOSCOPY (EGD) WITH PROPOFOL N/A 12/26/2016   Dr. Oneida Alar: one benign-appearing, intrinsic stenosis that was widely patent and traversed. Small hiatal hernia,  few small sessile polyps. Chronic gastritis. Normal duodenum, negative celiac sprue.    ESOPHAGOGASTRODUODENOSCOPY (EGD) WITH PROPOFOL N/A 09/23/2019   Surgeon: Danie Binder, MD;  1 benign-appearing esophageal stenosis s/p dilation, esophagus was biopsied, normal examined stomach and duodenum.  Pathology with mild reactive changes, no increased intraepithelial eosinophils.   HERNIA REPAIR     inguinal as a baby    left ovarian removal due to cyst     PILONIDAL CYST / SINUS EXCISION  2009   RECTOCELE REPAIR N/A 10/02/2017   Procedure: POSTERIOR REPAIR (RECTOCELE);  Surgeon: Jonnie Kind, MD;  [facility]: [facility];  Service: Gynecology;  Laterality: N/A;   SAVORY DILATION N/A 09/23/2019   Procedure: SAVORY DILATION;  Surgeon: Danie Binder, MD;  [facility];  Service: Endoscopy;  Laterality: N/A;   SHOULDER ARTHROSCOPY WITH ROTATOR CUFF REPAIR Right 09/22/2021   Procedure: SHOULDER ARTHROSCOPY WITH ROTATOR CUFF REPAIR, BICEPS TENOTOMY, SUBACROMIAL DECOMPRESSION, DEBRIDEMENT;  Surgeon: Nicholes Stairs, MD;  Location: [facility];  Service: Orthopedics;  Laterality: Right;  120   SINOSCOPY     TUBAL LIGATION     VAGINAL HYSTERECTOMY N/A 10/02/2017   Procedure: HYSTERECTOMY VAGINAL;  Surgeon: Jonnie Kind, MD;  Location: [facility];  Service: Gynecology;  Laterality: N/A;   Patient  Active Problem List   Diagnosis Date Noted   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   Fatty liver 09/30/2020   IBS (irritable bowel syndrome) 09/30/2020   Chronic cholecystitis    Radiculopathy due to lumbar intervertebral disc disorder 10/16/2019   Rectal leakage 06/17/2019   Abdominal pain 09/03/2018   Rectal pain 09/03/2018   Mild intermittent asthma, uncomplicated 04/24/2018   Adverse food reaction 04/24/2018   Perennial allergic rhinitis 04/24/2018   Allergic urticaria 04/24/2018   Gastroesophageal reflux disease    Hiatal hernia    Hematometra 10/02/2017   Pelvic pain in female 10/02/2017    Status post vaginal hysterectomy 10/02/2017   Rectal bleeding 07/26/2017   Rectocele, female 06/28/2017   Uterine prolapse 06/28/2017   Diarrhea 04/26/2017   Chronic bilateral low back pain without sciatica 02/15/2017   Dyspepsia 12/21/2016   Dysphagia 12/21/2016   Nausea without vomiting 12/21/2016   Chronic migraine 07/20/2016   Neck pain 07/20/2016   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 She states pain has progressively worsened lately. She has been experiencing headaches and pain at base of skull. Also been having some dizziness lately. She denied prior treatment for this. She takes Advil/ tylenol and uses heating pad to manage symptoms.   PERTINENT HISTORY:  Joint pain, hips, back, shoulder, fibromyalgia   PAIN:  Are you having pain? Yes: NPRS scale: 3/10 Pain location: neck, base of skull  Pain description: sharp, tight, aching  Aggravating factors: unsure  Relieving factors: meds, heating pad   PRECAUTIONS: None  WEIGHT BEARING RESTRICTIONS No  FALLS:  Has patient fallen in last 6 months? No  LIVING ENVIRONMENT: Lives with: lives with their family and lives with their spouse Lives in: House/apartment Stairs: Yes: External: 3 steps; none Has following equipment at home: None  OCCUPATION: Dog grooming   PLOF: Independent  PATIENT GOALS "Feel better"  OBJECTIVE:   DIAGNOSTIC FINDINGS:  NA    PATIENT SURVEYS:  NDI 28/50   COGNITION: Overall cognitive status: Within  functional limits for tasks assessed   SENSATION: Reports hx of carpal tunnel   POSTURE: rounded shoulders  PALPATION: Mod TTP about bilat cervical paraspinals, sub occipital region    CERVICAL ROM:   Active ROM A/PROM (deg) eval  Flexion 32  Extension 40  Right lateral flexion   Left lateral flexion   Right rotation 50  Left rotation 38   (Blank rows = not tested)  UPPER EXTREMITY ROM:  Bilat shoulder AROM WFL   UPPER EXTREMITY MMT:  MMT Right eval Left eval  Shoulder flexion 4+ 4+  Shoulder extension    Shoulder abduction 4 4  Shoulder adduction    Shoulder extension    Shoulder internal rotation 4+ 4+  Shoulder external rotation 4 4  Middle trapezius    Lower trapezius    Elbow flexion    Elbow extension    Wrist flexion    Wrist extension    Wrist ulnar deviation    Wrist radial deviation    Wrist pronation    Wrist supination    Grip strength     (Blank rows = not tested)     TODAY'S TREATMENT:  Eval Chin tuck Scap retraction    PATIENT EDUCATION:  Education details: on eval findings, POC and HEP  Person educated: Patient Education method: Explanation Education comprehension: verbalized understanding   HOME EXERCISE PROGRAM: Access Code: [email or web address] URL: [email or web address] Date: 06/12/2022 Prepared by: Georges Lynch  Exercises - Seated Scapular Retraction  - 2-3 x daily - 7 x weekly - 1 sets - 10 reps - 5 second hold - Seated Cervical Retraction  - 2-3 x daily - 7 x weekly - 1 sets - 10 reps - 5 second hold  ASSESSMENT:  CLINICAL IMPRESSION: Patient is a 46 y.o. female who presents to physical therapy with complaint of neck pain. Patient demonstrates decreased strength, ROM restriction, reduced flexibility, increased tenderness to palpation and postural abnormalities which are likely contributing to symptoms of pain and are negatively  impacting patient ability to perform ADLs. Patient will benefit from skilled physical therapy services to address these deficits to reduce pain and improve level of function with ADLs   OBJECTIVE IMPAIRMENTS decreased activity tolerance, decreased ROM, decreased strength, increased fascial restrictions, impaired flexibility, impaired UE functional use, and pain.   ACTIVITY LIMITATIONS carrying, lifting, and reach over head  PARTICIPATION LIMITATIONS: meal prep, cleaning, laundry, driving, shopping, community activity, occupation, and yard work  PERSONAL FACTORS Time since onset of injury/illness/exacerbation are also affecting patient's functional outcome.   REHAB POTENTIAL: Good  CLINICAL DECISION MAKING: Stable/uncomplicated  EVALUATION COMPLEXITY: Low   GOALS: SHORT TERM GOALS: Target date: 07/03/2022  Patient will be independent with initial HEP and self-management strategies to improve functional outcomes Baseline:  Goal status: INITIAL    LONG TERM GOALS: Target date: 07/24/2022  Patient will be independent with advanced HEP and self-management strategies to improve functional outcomes Baseline:  Goal status: INITIAL  2.  Patient will improve NDI score by at least 9 points to indicate improvement in functional outcomes Baseline: 28/50 Goal status: INITIAL  3.  Patient will demo improved LT cervical rotation by 10 degrees in order to improve ability to scan environment for safety and while driving. Baseline: 38 deg Goal status: INITIAL  4. Patient will report a decrease in neck pain to no more than 3/10 at worst for improved quality of life and ability to perform UE ADLs  Baseline: 9-10/10 Goal status: INITIAL  PLAN: PT FREQUENCY: 1-2x/week  PT  DURATION: 6 weeks  PLANNED INTERVENTIONS: Therapeutic exercises, Therapeutic activity, Neuromuscular re-education, Balance training, Gait training, Patient/Family education, Joint manipulation, Joint mobilization, Stair  training, Aquatic Therapy, Dry Needling, Electrical stimulation, Spinal manipulation, Spinal mobilization, Cryotherapy, Moist heat, scar mobilization, Taping, Traction, Ultrasound, Biofeedback, Ionotophoresis 4mg /ml Dexamethasone, and Manual therapy.   PLAN FOR NEXT SESSION: Progress pain free cervical AROM, and postural strength as tolerated. Manual STM as needed for pain and restriction.   9:34 AM, 06/12/22 Georges Lynchameron Cachet Mccutchen PT DPT  Physical Therapist with [facility]  [facility]  [phone]

## 2022-06-25 NOTE — Progress Notes (Unsigned)
GI Office Note    Referring Provider: Practice, [place] Primary Care Physician:  P[facility], [facility]  Primary Gastroenterologist: Elon Alas. Abbey Chatters, DO   Chief Complaint   No chief complaint on file.   History of Present Illness   Ruth Shah is a 46 y.o. female presenting today for follow up. Last seen in 09/2021. She has h/o IBS, chronic right sided abdominal/rib pain, fatty liver, GERD.  Since last ov, she completed celiac seriologies which were negative. She was given [place] for possible bile salt diarrhea. Advised low fat diet, lactose free trial for 2 weeks.         Medications   Current Outpatient Medications  Medication Sig Dispense Refill   Cholecalciferol (VITAMIN D3) 125 MCG (5000 UT) CAPS Take 5,000 Units by mouth daily.     cholestyramine (QUESTRAN) 4 g packet Take 1 packet (4 g total) by mouth daily. Take other medications at least 1 hour before or at least 4 to 6 hours after cholestyramine 30 each 3   dicyclomine (BENTYL) 10 MG capsule Take 1 capsule (10 mg total) by mouth 4 (four) times daily as needed for abdominal cramping and diarrhea. 90 capsule 1   hydrochlorothiazide (HYDRODIURIL) 12.5 MG tablet Take 12.5 mg by mouth daily.     ibuprofen (ADVIL) 200 MG tablet Take 400 mg by mouth every 6 (six) hours as needed for headache or moderate pain.     methylphenidate 18 MG PO CR tablet Take 18 mg by mouth daily.     ondansetron (ZOFRAN-ODT) 4 MG disintegrating tablet Take 1 tablet (4 mg total) by mouth every 8 (eight) hours as needed for nausea or vomiting. 20 tablet 0   pantoprazole (PROTONIX) 40 MG tablet Take 1 tablet (40 mg total) by mouth 2 (two) times daily before a meal. 180 tablet 1   propranolol ER (INDERAL LA) 80 MG 24 hr capsule Take 80 mg by mouth at bedtime.      TURMERIC CURCUMIN PO Take 1 capsule by mouth daily.     vitamin C (ASCORBIC ACID) 500 MG tablet Take 500 mg by mouth at bedtime.     Vitamin D-Vitamin K (VITAMIN  K2-VITAMIN D3 PO) Take 1 tablet by mouth daily.     Current Facility-Administered Medications  Medication Dose Route Frequency Provider Last Rate Last Admin   methylPREDNISolone acetate (DEPO-MEDROL) injection 40 mg  40 mg Other Once Magnus Sinning, MD        Allergies   Allergies as of 06/26/2022 - Review Complete 06/12/2022  Allergen Reaction Noted   Morphine and related Itching 09/22/2021   Dilaudid [hydromorphone hcl] Nausea And Vomiting 10/10/2017   Duloxetine Other (See Comments) 06/13/2018   Sulfa antibiotics Nausea Only 07/20/2016   Toradol [ketorolac tromethamine] Nausea Only 07/20/2016   Vicodin [hydrocodone-acetaminophen] Itching 08/12/2015     Past Medical History   Past Medical History:  Diagnosis Date   ADD (attention deficit disorder)    Arthritis    Asthma    mild seasonal reactions to mold and mildew   Back pain    Depression    Family history of adverse reaction to anesthesia    mother has severe PONV   Fatty liver    Fibromyalgia    GERD (gastroesophageal reflux disease)    Herpes simplex disease    History of kidney stones    Hypertension    Migraines    Paresthesia of both hands    PONV (postoperative nausea and vomiting)  Urticaria     Past Surgical History   Past Surgical History:  Procedure Laterality Date   BIOPSY  09/23/2019   Procedure: BIOPSY;  Surgeon: Danie Binder, MD;  [facility];  Service: Endoscopy;;  esophagus   CHOLECYSTECTOMY N/A 04/08/2020   Procedure: LAPAROSCOPIC CHOLECYSTECTOMY;  Surgeon: Aviva Signs, MD;  Location: [facility];  Service: General;  Laterality: N/A;   ENDOMETRIAL ABLATION     ESOPHAGEAL MANOMETRY N/A 10/15/2017   Procedure: ESOPHAGEAL MANOMETRY (EM);  Surgeon: Mauri Pole, MD;  [facility]: [facility];  Service: Endoscopy;  Laterality: N/A;   ESOPHAGOGASTRODUODENOSCOPY (EGD) WITH PROPOFOL N/A 12/26/2016   Dr. Oneida Alar: one benign-appearing, intrinsic stenosis that was widely patent and  traversed. Small hiatal hernia, few small sessile polyps. Chronic gastritis. Normal duodenum, negative celiac sprue.    ESOPHAGOGASTRODUODENOSCOPY (EGD) WITH PROPOFOL N/A 09/23/2019   Surgeon: Danie Binder, MD;  1 benign-appearing esophageal stenosis s/p dilation, esophagus was biopsied, normal examined stomach and duodenum.  Pathology with mild reactive changes, no increased intraepithelial eosinophils.   HERNIA REPAIR     inguinal as a baby    left ovarian removal due to cyst     PILONIDAL CYST / SINUS EXCISION  2009   RECTOCELE REPAIR N/A 10/02/2017   Procedure: POSTERIOR REPAIR (RECTOCELE);  Surgeon: Jonnie Kind, MD;  Location: [facility];  Service: Gynecology;  Laterality: N/A;   SAVORY DILATION N/A 09/23/2019   Procedure: SAVORY DILATION;  Surgeon: Danie Binder, MD;  [facility];  Service: Endoscopy;  Laterality: N/A;   SHOULDER ARTHROSCOPY WITH ROTATOR CUFF REPAIR Right 09/22/2021   Procedure: SHOULDER ARTHROSCOPY WITH ROTATOR CUFF REPAIR, BICEPS TENOTOMY, SUBACROMIAL DECOMPRESSION, DEBRIDEMENT;  Surgeon: Nicholes Stairs, MD;  Location: [facility];  Service: Orthopedics;  Laterality: Right;  120   SINOSCOPY     TUBAL LIGATION     VAGINAL HYSTERECTOMY N/A 10/02/2017   Procedure: HYSTERECTOMY VAGINAL;  Surgeon: Jonnie Kind, MD;  Location: [facility];  Service: Gynecology;  Laterality: N/A;    Past Family History   Family History  Problem Relation Age of Onset   Cancer Mother        skin   Depression Mother    Hypertension Mother    Hyperlipidemia Mother    Diabetes Mother    Cancer Father        skin   Arthritis Father    Hypertension Father    Hyperlipidemia Father    Asthma Brother    Depression Maternal Grandmother    Hypertension Maternal Grandmother    Mental illness Maternal Grandmother    Cancer Maternal Grandfather        kidney   Hypertension Maternal Grandfather    Kidney disease Maternal Grandfather    Emphysema Paternal Grandmother     Alzheimer's disease Paternal Grandmother    Parkinson's disease Paternal Grandfather    Colon cancer Neg Hx    Colon polyps Neg Hx    Allergic rhinitis Neg Hx    Eczema Neg Hx    Urticaria Neg Hx     Past Social History   Social History   Socioeconomic History   Marital status: Married    Spouse name: Not on file   Number of children: 4   Years of education: Some College   Highest education level: Not on file  Occupational History   Not on file  Tobacco Use   Smoking status: Never   Smokeless tobacco: Never  Vaping Use  Vaping Use: Never used  Substance and Sexual Activity   Alcohol use: No    Alcohol/week: 0.0 standard drinks of alcohol   Drug use: No   Sexual activity: Yes    Birth control/protection: Surgical    Comment: hyst  Other Topics Concern   Not on file  Social History Narrative   Lives at home with husband and children.   Right-handed.   4 cups caffeine per week.   Social Determinants of Health   Financial Resource Strain: Not on file  Food Insecurity: Not on file  Transportation Needs: Not on file  Physical Activity: Not on file  Stress: Not on file  Social Connections: Not on file  Intimate Partner Violence: Not on file    Review of Systems   General: Negative for anorexia, weight loss, fever, chills, fatigue, weakness. ENT: Negative for hoarseness, difficulty swallowing , nasal congestion. CV: Negative for chest pain, angina, palpitations, dyspnea on exertion, peripheral edema.  Respiratory: Negative for dyspnea at rest, dyspnea on exertion, cough, sputum, wheezing.  GI: See history of present illness. GU:  Negative for dysuria, hematuria, urinary incontinence, urinary frequency, nocturnal urination.  Endo: Negative for unusual weight change.     Physical Exam   There were no vitals taken for this visit.   General: Well-nourished, well-developed in no acute distress.  Eyes: No icterus. Mouth: Oropharyngeal mucosa moist and pink , no  lesions erythema or exudate. Lungs: Clear to auscultation bilaterally.  Heart: Regular rate and rhythm, no murmurs rubs or gallops.  Abdomen: Bowel sounds are normal, nontender, nondistended, no hepatosplenomegaly or masses,  no abdominal bruits or hernia , no rebound or guarding.  Rectal: ***  Extremities: No lower extremity edema. No clubbing or deformities. Neuro: Alert and oriented x 4   Skin: Warm and dry, no jaundice.   Psych: Alert and cooperative, normal mood and affect.  Labs   *** Imaging Studies   No results found.  Assessment       PLAN   ***   Laureen Ochs. Bobby Rumpf, [place], [facility]

## 2022-06-26 ENCOUNTER — Ambulatory Visit (HOSPITAL_COMMUNITY): Payer: Medicaid Other | Attending: Orthopedic Surgery

## 2022-06-26 ENCOUNTER — Ambulatory Visit (INDEPENDENT_AMBULATORY_CARE_PROVIDER_SITE_OTHER): Payer: Medicaid Other | Admitting: Gastroenterology

## 2022-06-26 ENCOUNTER — Encounter: Payer: Self-pay | Admitting: Gastroenterology

## 2022-06-26 VITALS — BP 159/113 | HR 70 | Temp 98.2°F | Ht 63.0 in | Wt 187.0 lb

## 2022-06-26 DIAGNOSIS — K760 Fatty (change of) liver, not elsewhere classified: Secondary | ICD-10-CM | POA: Diagnosis not present

## 2022-06-26 DIAGNOSIS — K580 Irritable bowel syndrome with diarrhea: Secondary | ICD-10-CM | POA: Diagnosis not present

## 2022-06-26 DIAGNOSIS — R151 Fecal smearing: Secondary | ICD-10-CM

## 2022-06-26 DIAGNOSIS — M25511 Pain in right shoulder: Secondary | ICD-10-CM | POA: Diagnosis present

## 2022-06-26 DIAGNOSIS — M542 Cervicalgia: Secondary | ICD-10-CM | POA: Diagnosis present

## 2022-06-26 DIAGNOSIS — G8929 Other chronic pain: Secondary | ICD-10-CM

## 2022-06-26 DIAGNOSIS — M25611 Stiffness of right shoulder, not elsewhere classified: Secondary | ICD-10-CM | POA: Insufficient documentation

## 2022-06-26 DIAGNOSIS — K219 Gastro-esophageal reflux disease without esophagitis: Secondary | ICD-10-CM | POA: Diagnosis not present

## 2022-06-26 DIAGNOSIS — R1011 Right upper quadrant pain: Secondary | ICD-10-CM

## 2022-06-26 DIAGNOSIS — R197 Diarrhea, unspecified: Secondary | ICD-10-CM

## 2022-06-26 NOTE — Therapy (Signed)
OUTPATIENT PHYSICAL THERAPY CERVICAL EVALUATION   Patient Name: Ruth Shah MRN: [id number] DOB:[birth year], 46 y.o., female Today's Date: 06/26/2022   PT End of Session - 06/26/22 1353     Visit Number 2    Number of Visits 9    Date for PT Re-Evaluation 07/24/22    Authorization Type Medicaid [employer]    Authorization Time Period Submitted for 8 total visits over 6 week period (check auth)    Authorization - Visit Number 1    Authorization - Number of Visits 1    PT Start Time 1350    PT Stop Time 1429    PT Time Calculation (min) 39 min    Activity Tolerance Patient tolerated treatment well    Behavior During Therapy WFL for tasks assessed/performed              Past Medical History:  Diagnosis Date   ADD (attention deficit disorder)    Arthritis    Asthma    mild seasonal reactions to mold and mildew   Back pain    Depression    Family history of adverse reaction to anesthesia    mother has severe PONV   Fatty liver    Fibromyalgia    GERD (gastroesophageal reflux disease)    Herpes simplex disease    History of kidney stones    Hypertension    Migraines    Paresthesia of both hands    PONV (postoperative nausea and vomiting)    Urticaria    Past Surgical History:  Procedure Laterality Date   BIOPSY  09/23/2019   Procedure: BIOPSY;  Surgeon: West Bali, MD;  [facility];  Service: Endoscopy;;  esophagus   CHOLECYSTECTOMY N/A 04/08/2020   Procedure: LAPAROSCOPIC CHOLECYSTECTOMY;  Surgeon: Franky Macho, MD;  [facility];  Service: General;  Laterality: N/A;   ENDOMETRIAL ABLATION     ESOPHAGEAL MANOMETRY N/A 10/15/2017   Procedure: ESOPHAGEAL MANOMETRY (EM);  Surgeon: Napoleon Form, MD;  [facility];  Service: Endoscopy;  Laterality: N/A;   ESOPHAGOGASTRODUODENOSCOPY (EGD) WITH PROPOFOL N/A 12/26/2016   Dr. Darrick Penna: one benign-appearing, intrinsic stenosis that was widely patent and traversed. Small hiatal hernia,  few small sessile polyps. Chronic gastritis. Normal duodenum, negative celiac sprue.    ESOPHAGOGASTRODUODENOSCOPY (EGD) WITH PROPOFOL N/A 09/23/2019   Surgeon: West Bali, MD;  1 benign-appearing esophageal stenosis s/p dilation, esophagus was biopsied, normal examined stomach and duodenum.  Pathology with mild reactive changes, no increased intraepithelial eosinophils.   HERNIA REPAIR     inguinal as a baby    left ovarian removal due to cyst     PILONIDAL CYST / SINUS EXCISION  2009   RECTOCELE REPAIR N/A 10/02/2017   Procedure: POSTERIOR REPAIR (RECTOCELE);  Surgeon: Tilda Burrow, MD;  [facility];  Service: Gynecology;  Laterality: N/A;   SAVORY DILATION N/A 09/23/2019   Procedure: SAVORY DILATION;  Surgeon: West Bali, MD;  [facility];  Service: Endoscopy;  Laterality: N/A;   SHOULDER ARTHROSCOPY WITH ROTATOR CUFF REPAIR Right 09/22/2021   Procedure: SHOULDER ARTHROSCOPY WITH ROTATOR CUFF REPAIR, BICEPS TENOTOMY, SUBACROMIAL DECOMPRESSION, DEBRIDEMENT;  Surgeon: Yolonda Kida, MD;  [facility]: [facility];  Service: Orthopedics;  Laterality: Right;  120   SINOSCOPY     TUBAL LIGATION     VAGINAL HYSTERECTOMY N/A 10/02/2017   Procedure: HYSTERECTOMY VAGINAL;  Surgeon: Tilda Burrow, MD;  Location: [facility];  Service: Gynecology;  Laterality: N/A;  Patient Active Problem List   Diagnosis Date Noted   Chronic RUQ pain 06/26/2022   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   Fatty liver 09/30/2020   IBS (irritable bowel syndrome) 09/30/2020   Chronic cholecystitis    Radiculopathy due to lumbar intervertebral disc disorder 10/16/2019   Rectal leakage 06/17/2019   Abdominal pain 09/03/2018   Rectal pain 09/03/2018   Mild intermittent asthma, uncomplicated 60/06/9322   Adverse food reaction 04/24/2018   Perennial allergic rhinitis 04/24/2018   Allergic urticaria 04/24/2018   Gastroesophageal reflux disease    Hiatal hernia    Hematometra 10/02/2017   Pelvic  pain in female 10/02/2017   Status post vaginal hysterectomy 10/02/2017   Rectal bleeding 07/26/2017   Rectocele, female 06/28/2017   Uterine prolapse 06/28/2017   Diarrhea 04/26/2017   Chronic bilateral low back pain without sciatica 02/15/2017   Dyspepsia 12/21/2016   Dysphagia 12/21/2016   Nausea without vomiting 12/21/2016   Chronic migraine 07/20/2016   Neck pain 07/20/2016   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   H/O Genital warts 08/12/2015    PCP: Tawni Carnes PA   REFERRING PROVIDER: Melina Schools, MD   REFERRING DIAG: M54.2 cervicalgia   THERAPY DIAG:  Cervicalgia  Rationale for Evaluation and Treatment Rehabilitation  ONSET DATE: Chronic (several years)   SUBJECTIVE:                                                                                                                                                                                                         SUBJECTIVE STATEMENT: Doing ok today; still too soon to tell if the exercises are helping yet  PERTINENT HISTORY:  Joint pain, hips, back, shoulder, fibromyalgia   PAIN:  Are you having pain? Yes: NPRS scale: 2-3/10 Pain location: neck, base of skull  Pain description: sharp, tight, aching  Aggravating factors: unsure  Relieving factors: meds, heating pad   PRECAUTIONS: None  WEIGHT BEARING RESTRICTIONS No  FALLS:  Has patient fallen in last 6 months? No  LIVING ENVIRONMENT: Lives with: lives with their family and lives with their spouse Lives in: House/apartment Stairs: Yes: External: 3 steps; none Has following equipment at home: None  OCCUPATION: Dog grooming   PLOF: Independent  PATIENT GOALS "Feel better"  OBJECTIVE:   DIAGNOSTIC FINDINGS:  NA   PATIENT SURVEYS:  NDI 28/50   COGNITION: Overall cognitive status: Within functional limits for tasks assessed   SENSATION: Reports hx of carpal tunnel   POSTURE: rounded shoulders  PALPATION: Mod TTP about bilat cervical  paraspinals, sub occipital region    CERVICAL  ROM:   Active ROM A/PROM (deg) eval  Flexion 32  Extension 40  Right lateral flexion   Left lateral flexion   Right rotation 50  Left rotation 38   (Blank rows = not tested)  UPPER EXTREMITY ROM:  Bilat shoulder AROM WFL   UPPER EXTREMITY MMT:  MMT Right eval Left eval  Shoulder flexion 4+ 4+  Shoulder extension    Shoulder abduction 4 4  Shoulder adduction    Shoulder extension    Shoulder internal rotation 4+ 4+  Shoulder external rotation 4 4  Middle trapezius    Lower trapezius    Elbow flexion    Elbow extension    Wrist flexion    Wrist extension    Wrist ulnar deviation    Wrist radial deviation    Wrist pronation    Wrist supination    Grip strength     (Blank rows = not tested)     TODAY'S TREATMENT:  06/26/2022 Review of HEP and goals Seated: Cervical retractions x 10 Scapular retractions 5" hold 2 x 10 STM to bilateral upper traps and cervical paraspinals x 10'   Supine: Cervical retractions 5"  x 10 Scapular retractions 5" x 10 Elbow press 5" x 10 Manual cervical traction 10" hold x 10    Eval Chin tuck Scap retraction    PATIENT EDUCATION:  Education details: on eval findings, POC and HEP  Person educated: Patient Education method: Explanation Education comprehension: verbalized understanding   HOME EXERCISE PROGRAM: Access Code: [email or web address] URL: [email or web address] Date: 06/12/2022 Prepared by: Georges Teresa Lemmerman  Exercises - Seated Scapular Retraction  - 2-3 x daily - 7 x weekly - 1 sets - 10 reps - 5 second hold - Seated Cervical Retraction  - 2-3 x daily - 7 x weekly - 1 sets - 10 reps - 5 second hold  ASSESSMENT:  CLINICAL IMPRESSION: Today's session started with a review of HEP and goals. Patient verbalizes agreement with set rehab goals.  Patient reports some pinching with seated cervical retractions so tried in supine to decrease compression on spine with  decreased report of pinching.  STM to cervical spine to decrease pain and improve tissue mobility. Reports decreased pain after treatment today. Patient will benefit from skilled physical therapy services to address these deficits to reduce pain and improve level of function with ADLs   OBJECTIVE IMPAIRMENTS decreased activity tolerance, decreased ROM, decreased strength, increased fascial restrictions, impaired flexibility, impaired UE functional use, and pain.   ACTIVITY LIMITATIONS carrying, lifting, and reach over head  PARTICIPATION LIMITATIONS: meal prep, cleaning, laundry, driving, shopping, community activity, occupation, and yard work  PERSONAL FACTORS Time since onset of injury/illness/exacerbation are also affecting patient's functional outcome.   REHAB POTENTIAL: Good  CLINICAL DECISION MAKING: Stable/uncomplicated  EVALUATION COMPLEXITY: Low   GOALS: SHORT TERM GOALS: Target date: 07/03/2022  Patient will be independent with initial HEP and self-management strategies to improve functional outcomes Baseline:  Goal status: IN PROGRESS    LONG TERM GOALS: Target date: 07/24/2022  Patient will be independent with advanced HEP and self-management strategies to improve functional outcomes Baseline:  Goal status: IN PROGRESS  2.  Patient will improve NDI score by at least 9 points to indicate improvement in functional outcomes Baseline: 28/50 Goal status: IN PROGRESS  3.  Patient will demo improved LT cervical rotation by 10 degrees in order to improve ability to scan environment for safety and while driving. Baseline: 38 deg Goal status: IN PROGRESS  4. Patient  will report a decrease in neck pain to no more than 3/10 at worst for improved quality of life and ability to perform UE ADLs  Baseline: 9-10/10 Goal status: IN PROGRESS  PLAN: PT FREQUENCY: 1-2x/week  PT DURATION: 6 weeks  PLANNED INTERVENTIONS: Therapeutic exercises, Therapeutic activity, Neuromuscular  re-education, Balance training, Gait training, Patient/Family education, Joint manipulation, Joint mobilization, Stair training, Aquatic Therapy, Dry Needling, Electrical stimulation, Spinal manipulation, Spinal mobilization, Cryotherapy, Moist heat, scar mobilization, Taping, Traction, Ultrasound, Biofeedback, Ionotophoresis 4mg /ml Dexamethasone, and Manual therapy.   PLAN FOR NEXT SESSION: Progress pain free cervical AROM, and postural strength as tolerated. Manual STM as needed for pain and restriction.   2:37 PM, 06/26/22 Equilla Que Small Cathey [place] MPT [facility] [place] [phone]

## 2022-06-26 NOTE — Patient Instructions (Addendum)
Try taking imodium 1/2 tablet every day to firm up stool and help with anal leakage.  Continue pantoprazole 40mg  twice daily before a meal for acid reflux. You can add a probiotic daily for next 1-2 months to see if this helps your stool frequency/consistency. Colonoscopy in the near future. See separate instructions. You will need to hold imodium for one week before your colonoscopy.

## 2022-06-29 ENCOUNTER — Encounter: Payer: Self-pay | Admitting: *Deleted

## 2022-06-29 MED ORDER — PEG 3350-KCL-NA BICARB-NACL 420 G PO SOLR: 4000.0000 mL | Freq: Once | ORAL | 0 refills | Status: AC

## 2022-07-03 ENCOUNTER — Ambulatory Visit (HOSPITAL_COMMUNITY): Payer: Medicaid Other | Attending: Orthopedic Surgery

## 2022-07-03 DIAGNOSIS — M25511 Pain in right shoulder: Secondary | ICD-10-CM | POA: Diagnosis present

## 2022-07-03 DIAGNOSIS — M25611 Stiffness of right shoulder, not elsewhere classified: Secondary | ICD-10-CM | POA: Diagnosis present

## 2022-07-03 DIAGNOSIS — M542 Cervicalgia: Secondary | ICD-10-CM | POA: Diagnosis present

## 2022-07-03 NOTE — Therapy (Signed)
OUTPATIENT PHYSICAL THERAPY CERVICAL EVALUATION   Patient Name: Ruth Shah MRN: [id number] DOB:Jun 19, 1976, 46 y.o., female Today's Date: 07/03/2022   PT End of Session - 07/03/22 1349     Visit Number 3    Number of Visits 9    Date for PT Re-Evaluation 07/24/22    Authorization Type Medicaid [employer]    Authorization Time Period [employer] approved 10 visits from 06/12/22 to 08/11/22    Authorization - Visit Number 2    Authorization - Number of Visits 10    PT Start Time 1350    PT Stop Time 1430    PT Time Calculation (min) 40 min    Activity Tolerance Patient tolerated treatment well    Behavior During Therapy WFL for tasks assessed/performed               Past Medical History:  Diagnosis Date   ADD (attention deficit disorder)    Arthritis    Asthma    mild seasonal reactions to mold and mildew   Back pain    Depression    Family history of adverse reaction to anesthesia    mother has severe PONV   Fatty liver    Fibromyalgia    GERD (gastroesophageal reflux disease)    Herpes simplex disease    History of kidney stones    Hypertension    Migraines    Paresthesia of both hands    PONV (postoperative nausea and vomiting)    Urticaria    Past Surgical History:  Procedure Laterality Date   BIOPSY  09/23/2019   Procedure: BIOPSY;  Surgeon: West Bali, MD;  [facility];  Service: Endoscopy;;  esophagus   CHOLECYSTECTOMY N/A 04/08/2020   Procedure: LAPAROSCOPIC CHOLECYSTECTOMY;  Surgeon: Franky Macho, MD;  Location: [facility];  Service: General;  Laterality: N/A;   ENDOMETRIAL ABLATION     ESOPHAGEAL MANOMETRY N/A 10/15/2017   Procedure: ESOPHAGEAL MANOMETRY (EM);  Surgeon: Napoleon Form, MD;  [facility]: [facility];  Service: Endoscopy;  Laterality: N/A;   ESOPHAGOGASTRODUODENOSCOPY (EGD) WITH PROPOFOL N/A 12/26/2016   Dr. Darrick Penna: one benign-appearing, intrinsic stenosis that was widely patent and traversed. Small hiatal hernia, few  small sessile polyps. Chronic gastritis. Normal duodenum, negative celiac sprue.    ESOPHAGOGASTRODUODENOSCOPY (EGD) WITH PROPOFOL N/A 09/23/2019   Surgeon: West Bali, MD;  1 benign-appearing esophageal stenosis s/p dilation, esophagus was biopsied, normal examined stomach and duodenum.  Pathology with mild reactive changes, no increased intraepithelial eosinophils.   HERNIA REPAIR     inguinal as a baby    left ovarian removal due to cyst     PILONIDAL CYST / SINUS EXCISION  2009   RECTOCELE REPAIR N/A 10/02/2017   Procedure: POSTERIOR REPAIR (RECTOCELE);  Surgeon: Tilda Burrow, MD;  Location: [facility];  Service: Gynecology;  Laterality: N/A;   SAVORY DILATION N/A 09/23/2019   Procedure: SAVORY DILATION;  Surgeon: West Bali, MD;  [facility];  Service: Endoscopy;  Laterality: N/A;   SHOULDER ARTHROSCOPY WITH ROTATOR CUFF REPAIR Right 09/22/2021   Procedure: SHOULDER ARTHROSCOPY WITH ROTATOR CUFF REPAIR, BICEPS TENOTOMY, SUBACROMIAL DECOMPRESSION, DEBRIDEMENT;  Surgeon: Yolonda Kida, MD;  Location: [facility] OR;  Service: Orthopedics;  Laterality: Right;  120   SINOSCOPY     TUBAL LIGATION     VAGINAL HYSTERECTOMY N/A 10/02/2017   Procedure: HYSTERECTOMY VAGINAL;  Surgeon: Tilda Burrow, MD;  Location: [facility];  Service: Gynecology;  Laterality: N/A;   Patient Active  Problem List   Diagnosis Date Noted   Chronic RUQ pain 06/26/2022   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   Fatty liver 09/30/2020   IBS (irritable bowel syndrome) 09/30/2020   Chronic cholecystitis    Radiculopathy due to lumbar intervertebral disc disorder 10/16/2019   Rectal leakage 06/17/2019   Abdominal pain 09/03/2018   Rectal pain 09/03/2018   Mild intermittent asthma, uncomplicated 04/24/2018   Adverse food reaction 04/24/2018   Perennial allergic rhinitis 04/24/2018   Allergic urticaria 04/24/2018   Gastroesophageal reflux disease    Hiatal hernia    Hematometra 10/02/2017   Pelvic pain in  female 10/02/2017   Status post vaginal hysterectomy 10/02/2017   Rectal bleeding 07/26/2017   Rectocele, female 06/28/2017   Uterine prolapse 06/28/2017   Diarrhea 04/26/2017   Chronic bilateral low back pain without sciatica 02/15/2017   Dyspepsia 12/21/2016   Dysphagia 12/21/2016   Nausea without vomiting 12/21/2016   Chronic migraine 07/20/2016   Neck pain 07/20/2016   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   H/O Genital warts 08/12/2015    PCP: Roma Kayser PA   REFERRING PROVIDER: Venita Lick, MD   REFERRING DIAG: M54.2 cervicalgia   THERAPY DIAG:  Cervicalgia  Acute pain of right shoulder  Stiffness of right shoulder, not elsewhere classified  Rationale for Evaluation and Treatment Rehabilitation  ONSET DATE: Chronic (several years)   SUBJECTIVE:                                                                                                                                                                                                         SUBJECTIVE STATEMENT: Patient reports hurting generally today. " Went to [employer] yesterday and I'm hurting today"; neck pain is increased today.  Also reports she has had some high blood pressures; today had her blood pressure medication increased per her MD.   PERTINENT HISTORY:  Joint pain, hips, back, shoulder, fibromyalgia   PAIN:  Are you having pain? Yes: NPRS scale: 4-5/10 Pain location: neck, base of skull  Pain description: sharp, tight, aching  Aggravating factors: unsure  Relieving factors: meds, heating pad   PRECAUTIONS: None  WEIGHT BEARING RESTRICTIONS No  FALLS:  Has patient fallen in last 6 months? No  LIVING ENVIRONMENT: Lives with: lives with their family and lives with their spouse Lives in: House/apartment Stairs: Yes: External: 3 steps; none Has following equipment at home: None  OCCUPATION: Dog grooming   PLOF: Independent  PATIENT GOALS "Feel better"  OBJECTIVE:   DIAGNOSTIC FINDINGS:   NA   PATIENT SURVEYS:  NDI 28/50  COGNITION: Overall cognitive status: Within functional limits for tasks assessed   SENSATION: Reports hx of carpal tunnel   POSTURE: rounded shoulders  PALPATION: Mod TTP about bilat cervical paraspinals, sub occipital region    CERVICAL ROM:   Active ROM A/PROM (deg) eval  Flexion 32  Extension 40  Right lateral flexion   Left lateral flexion   Right rotation 50  Left rotation 38   (Blank rows = not tested)  UPPER EXTREMITY ROM:  Bilat shoulder AROM WFL   UPPER EXTREMITY MMT:  MMT Right eval Left eval  Shoulder flexion 4+ 4+  Shoulder extension    Shoulder abduction 4 4  Shoulder adduction    Shoulder extension    Shoulder internal rotation 4+ 4+  Shoulder external rotation 4 4  Middle trapezius    Lower trapezius    Elbow flexion    Elbow extension    Wrist flexion    Wrist extension    Wrist ulnar deviation    Wrist radial deviation    Wrist pronation    Wrist supination    Grip strength     (Blank rows = not tested)     TODAY'S TREATMENT:  07/03/2022 Supine: moist heat to neck x 10' to decrease pain and improve soft tissue mobility Cervical retractions 2 x 10 Scapular retractions 2 x 10 Manual cervical retractions 10" hold x 10  Sitting: STM to bilateral upper traps and cervical paraspinals x 10'  BP sitting 170/108 right arm     06/26/2022 Review of HEP and goals Seated: Cervical retractions x 10 Scapular retractions 5" hold 2 x 10 STM to bilateral upper traps and cervical paraspinals x 10'   Supine: Cervical retractions 5"  x 10 Scapular retractions 5" x 10 Elbow press 5" x 10 Manual cervical traction 10" hold x 10    Eval Chin tuck Scap retraction    PATIENT EDUCATION:  Education details: on eval findings, POC and HEP  Person educated: Patient Education method: Explanation Education comprehension: verbalized understanding   HOME EXERCISE PROGRAM: Access Code:  [email or web address] URL: [email or web address] Date: 06/12/2022 Prepared by: Josue Hector  Exercises - Seated Scapular Retraction  - 2-3 x daily - 7 x weekly - 1 sets - 10 reps - 5 second hold - Seated Cervical Retraction  - 2-3 x daily - 7 x weekly - 1 sets - 10 reps - 5 second hold  ASSESSMENT:  CLINICAL IMPRESSION: Today's session started with moist heat to cervical area to decrease pain and increase tissue mobility. Patient with elevated BP today so did not progress exercise.  Noted significant tightness in cervical spine paraspinals today. Reports decreased pain after treatment today.will start increased dose of BP med tomorrow per seeing her MD today.  Patient will benefit from skilled physical therapy services to address these deficits to reduce pain and improve level of function with ADLs   OBJECTIVE IMPAIRMENTS decreased activity tolerance, decreased ROM, decreased strength, increased fascial restrictions, impaired flexibility, impaired UE functional use, and pain.   ACTIVITY LIMITATIONS carrying, lifting, and reach over head  PARTICIPATION LIMITATIONS: meal prep, cleaning, laundry, driving, shopping, community activity, occupation, and yard work  PERSONAL FACTORS Time since onset of injury/illness/exacerbation are also affecting patient's functional outcome.   REHAB POTENTIAL: Good  CLINICAL DECISION MAKING: Stable/uncomplicated  EVALUATION COMPLEXITY: Low   GOALS: SHORT TERM GOALS: Target date: 07/03/2022  Patient will be independent with initial HEP and self-management strategies to improve functional outcomes Baseline:  Goal status: IN PROGRESS  LONG TERM GOALS: Target date: 07/24/2022  Patient will be independent with advanced HEP and self-management strategies to improve functional outcomes Baseline:  Goal status: IN PROGRESS  2.  Patient will improve NDI score by at least 9 points to indicate improvement in functional outcomes Baseline: 28/50 Goal  status: IN PROGRESS  3.  Patient will demo improved LT cervical rotation by 10 degrees in order to improve ability to scan environment for safety and while driving. Baseline: 38 deg Goal status: IN PROGRESS  4. Patient will report a decrease in neck pain to no more than 3/10 at worst for improved quality of life and ability to perform UE ADLs  Baseline: 9-10/10 Goal status: IN PROGRESS  PLAN: PT FREQUENCY: 1-2x/week  PT DURATION: 6 weeks  PLANNED INTERVENTIONS: Therapeutic exercises, Therapeutic activity, Neuromuscular re-education, Balance training, Gait training, Patient/Family education, Joint manipulation, Joint mobilization, Stair training, Aquatic Therapy, Dry Needling, Electrical stimulation, Spinal manipulation, Spinal mobilization, Cryotherapy, Moist heat, scar mobilization, Taping, Traction, Ultrasound, Biofeedback, Ionotophoresis 4mg /ml Dexamethasone, and Manual therapy.   PLAN FOR NEXT SESSION: Progress pain free cervical AROM, and postural strength as tolerated. Manual STM as needed for pain and restriction.   3:03 PM, 07/03/22 Natavia Sublette Small Laretha Luepke MPT [facility] [place] [phone]

## 2022-07-10 ENCOUNTER — Encounter (HOSPITAL_COMMUNITY): Payer: Medicaid Other | Admitting: Physical Therapy

## 2022-07-17 ENCOUNTER — Ambulatory Visit (HOSPITAL_COMMUNITY): Payer: Medicaid Other | Admitting: Physical Therapy

## 2022-07-17 DIAGNOSIS — M542 Cervicalgia: Secondary | ICD-10-CM

## 2022-07-17 DIAGNOSIS — M25511 Pain in right shoulder: Secondary | ICD-10-CM

## 2022-07-17 DIAGNOSIS — M25611 Stiffness of right shoulder, not elsewhere classified: Secondary | ICD-10-CM

## 2022-07-17 NOTE — Therapy (Signed)
OUTPATIENT PHYSICAL THERAPY CERVICAL EVALUATION   Patient Name: Ruth Shah MRN: [id number] DOB:[birth year], 46 y.o., female Today's Date: 07/17/2022   PT End of Session - 07/17/22 0946     Visit Number 4    Number of Visits 9    Date for PT Re-Evaluation 07/24/22    Authorization Type Medicaid [employer]    Authorization Time Period [employer] approved 10 visits from 06/12/22 to 08/11/22    Authorization - Visit Number 3    Authorization - Number of Visits 10    PT Start Time [phone]    PT Stop Time 1026    PT Time Calculation (min) 38 min    Activity Tolerance Patient tolerated treatment well    Behavior During Therapy WFL for tasks assessed/performed               Past Medical History:  Diagnosis Date   ADD (attention deficit disorder)    Arthritis    Asthma    mild seasonal reactions to mold and mildew   Back pain    Depression    Family history of adverse reaction to anesthesia    mother has severe PONV   Fatty liver    Fibromyalgia    GERD (gastroesophageal reflux disease)    Herpes simplex disease    History of kidney stones    Hypertension    Migraines    Paresthesia of both hands    PONV (postoperative nausea and vomiting)    Urticaria    Past Surgical History:  Procedure Laterality Date   BIOPSY  09/23/2019   Procedure: BIOPSY;  Surgeon: West Bali, MD;  [facility];  Service: Endoscopy;;  esophagus   CHOLECYSTECTOMY N/A 04/08/2020   Procedure: LAPAROSCOPIC CHOLECYSTECTOMY;  Surgeon: Franky Macho, MD;  Location: [facility];  Service: General;  Laterality: N/A;   ENDOMETRIAL ABLATION     ESOPHAGEAL MANOMETRY N/A 10/15/2017   Procedure: ESOPHAGEAL MANOMETRY (EM);  Surgeon: Napoleon Form, MD;  [facility];  Service: Endoscopy;  Laterality: N/A;   ESOPHAGOGASTRODUODENOSCOPY (EGD) WITH PROPOFOL N/A 12/26/2016   Dr. Darrick Penna: one benign-appearing, intrinsic stenosis that was widely patent and traversed. Small hiatal hernia, few  small sessile polyps. Chronic gastritis. Normal duodenum, negative celiac sprue.    ESOPHAGOGASTRODUODENOSCOPY (EGD) WITH PROPOFOL N/A 09/23/2019   Surgeon: West Bali, MD;  1 benign-appearing esophageal stenosis s/p dilation, esophagus was biopsied, normal examined stomach and duodenum.  Pathology with mild reactive changes, no increased intraepithelial eosinophils.   HERNIA REPAIR     inguinal as a baby    left ovarian removal due to cyst     PILONIDAL CYST / SINUS EXCISION  2009   RECTOCELE REPAIR N/A 10/02/2017   Procedure: POSTERIOR REPAIR (RECTOCELE);  Surgeon: Tilda Burrow, MD;  Location: [facility];  Service: Gynecology;  Laterality: N/A;   SAVORY DILATION N/A 09/23/2019   Procedure: SAVORY DILATION;  Surgeon: West Bali, MD;  [facility];  Service: Endoscopy;  Laterality: N/A;   SHOULDER ARTHROSCOPY WITH ROTATOR CUFF REPAIR Right 09/22/2021   Procedure: SHOULDER ARTHROSCOPY WITH ROTATOR CUFF REPAIR, BICEPS TENOTOMY, SUBACROMIAL DECOMPRESSION, DEBRIDEMENT;  Surgeon: Yolonda Kida, MD;  Location: [facility] OR;  Service: Orthopedics;  Laterality: Right;  120   SINOSCOPY     TUBAL LIGATION     VAGINAL HYSTERECTOMY N/A 10/02/2017   Procedure: HYSTERECTOMY VAGINAL;  Surgeon: Tilda Burrow, MD;  Location: [facility];  Service: Gynecology;  Laterality: N/A;   Patient Active  Problem List   Diagnosis Date Noted   Chronic RUQ pain 06/26/2022   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   Fatty liver 09/30/2020   IBS (irritable bowel syndrome) 09/30/2020   Chronic cholecystitis    Radiculopathy due to lumbar intervertebral disc disorder 10/16/2019   Rectal leakage 06/17/2019   Abdominal pain 09/03/2018   Rectal pain 09/03/2018   Mild intermittent asthma, uncomplicated 04/24/2018   Adverse food reaction 04/24/2018   Perennial allergic rhinitis 04/24/2018   Allergic urticaria 04/24/2018   Gastroesophageal reflux disease    Hiatal hernia    Hematometra 10/02/2017   Pelvic pain in  female 10/02/2017   Status post vaginal hysterectomy 10/02/2017   Rectal bleeding 07/26/2017   Rectocele, female 06/28/2017   Uterine prolapse 06/28/2017   Diarrhea 04/26/2017   Chronic bilateral low back pain without sciatica 02/15/2017   Dyspepsia 12/21/2016   Dysphagia 12/21/2016   Nausea without vomiting 12/21/2016   Chronic migraine 07/20/2016   Neck pain 07/20/2016   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   H/O Genital warts 08/12/2015    PCP: Roma Kayser PA   REFERRING PROVIDER: Venita Lick, MD   REFERRING DIAG: M54.2 cervicalgia   THERAPY DIAG:  Cervicalgia  Acute pain of right shoulder  Stiffness of right shoulder, not elsewhere classified  Rationale for Evaluation and Treatment Rehabilitation  ONSET DATE: Chronic (several years)   SUBJECTIVE:                                                                                                                                                                                                         SUBJECTIVE STATEMENT: Increased neck pain the last 2 days. Not sure why. Stayed in bed most of the day yesterday due to pain. Ongoing headaches as well.  PERTINENT HISTORY:  Joint pain, hips, back, shoulder, fibromyalgia   PAIN:  Are you having pain? Yes: NPRS scale: 6/10 Pain location: neck, base of skull  Pain description: sharp, tight, aching  Aggravating factors: unsure  Relieving factors: meds, heating pad   PRECAUTIONS: None  WEIGHT BEARING RESTRICTIONS No  FALLS:  Has patient fallen in last 6 months? No  LIVING ENVIRONMENT: Lives with: lives with their family and lives with their spouse Lives in: House/apartment Stairs: Yes: External: 3 steps; none Has following equipment at home: None  OCCUPATION: Dog grooming   PLOF: Independent  PATIENT GOALS "Feel better"  OBJECTIVE:   DIAGNOSTIC FINDINGS:  NA   PATIENT SURVEYS:  NDI 28/50   COGNITION: Overall cognitive status: Within functional limits for  tasks assessed   SENSATION: Reports hx  of carpal tunnel   POSTURE: rounded shoulders  PALPATION: Mod TTP about bilat cervical paraspinals, sub occipital region    CERVICAL ROM:   Active ROM A/PROM (deg) eval  Flexion 32  Extension 40  Right lateral flexion   Left lateral flexion   Right rotation 50  Left rotation 38   (Blank rows = not tested)  UPPER EXTREMITY ROM:  Bilat shoulder AROM WFL   UPPER EXTREMITY MMT:  MMT Right eval Left eval  Shoulder flexion 4+ 4+  Shoulder extension    Shoulder abduction 4 4  Shoulder adduction    Shoulder extension    Shoulder internal rotation 4+ 4+  Shoulder external rotation 4 4  Middle trapezius    Lower trapezius    Elbow flexion    Elbow extension    Wrist flexion    Wrist extension    Wrist ulnar deviation    Wrist radial deviation    Wrist pronation    Wrist supination    Grip strength     (Blank rows = not tested)     TODAY'S TREATMENT:  07/17/22 Supine: Chin tuck 2 x 10  Scap retraction 2 x 10  Shoulder ER with YTB 20 x 3" Shoulder flexion stretch with rod 20 x 3" Shoulder abduction YTB x20  Cervical rotation AROM x20  Manual IASTM to bilat upper trap and cervical paraspinals   07/03/2022 Supine: moist heat to neck x 10' to decrease pain and improve soft tissue mobility Cervical retractions 2 x 10 Scapular retractions 2 x 10 Manual cervical retractions 10" hold x 10  Sitting: STM to bilateral upper traps and cervical paraspinals x 10'  BP sitting 170/108 right arm   06/26/2022 Review of HEP and goals Seated: Cervical retractions x 10 Scapular retractions 5" hold 2 x 10 STM to bilateral upper traps and cervical paraspinals x 10'   Supine: Cervical retractions 5"  x 10 Scapular retractions 5" x 10 Elbow press 5" x 10 Manual cervical traction 10" hold x 10   PATIENT EDUCATION:  Education details: on eval findings, POC and HEP  Person educated: Patient Education method:  Explanation Education comprehension: verbalized understanding   HOME EXERCISE PROGRAM: Access Code: [email or web address] URL: [email or web address]  07/17/22 - Supine Shoulder External Rotation with Resistance  - 2 x daily - 7 x weekly - 2 sets - 10 reps - Supine Shoulder Horizontal Abduction with Resistance  - 2 x daily - 7 x weekly - 2 sets - 10 reps - Supine Cervical Retraction with Towel  - 2 x daily - 7 x weekly - 2 sets - 10 reps - Supine Scapular Retraction  - 2 x daily - 7 x weekly - 2 sets - 10 reps  Date: 06/12/2022 Prepared by: Georges Lynch  Exercises - Seated Scapular Retraction  - 2-3 x daily - 7 x weekly - 1 sets - 10 reps - 5 second hold - Seated Cervical Retraction  - 2-3 x daily - 7 x weekly - 1 sets - 10 reps - 5 second hold  ASSESSMENT:  CLINICAL IMPRESSION:  Tolerated session well overall. Progressed postural strengthening with added supine band decompression exercises. Improved muscle tension in neck following manual IASTM. Added to HEP and issued handout. Patient will continue to benefit from skilled therapy services to reduce remaining deficits and improve functional ability.    OBJECTIVE IMPAIRMENTS decreased activity tolerance, decreased ROM, decreased strength, increased fascial restrictions, impaired flexibility, impaired UE functional use, and pain.   ACTIVITY LIMITATIONS  carrying, lifting, and reach over head  PARTICIPATION LIMITATIONS: meal prep, cleaning, laundry, driving, shopping, community activity, occupation, and yard work  PERSONAL FACTORS Time since onset of injury/illness/exacerbation are also affecting patient's functional outcome.   REHAB POTENTIAL: Good  CLINICAL DECISION MAKING: Stable/uncomplicated  EVALUATION COMPLEXITY: Low   GOALS: SHORT TERM GOALS: Target date: 07/03/2022  Patient will be independent with initial HEP and self-management strategies to improve functional outcomes Baseline:  Goal status: IN PROGRESS     LONG TERM GOALS: Target date: 07/24/2022  Patient will be independent with advanced HEP and self-management strategies to improve functional outcomes Baseline:  Goal status: IN PROGRESS  2.  Patient will improve NDI score by at least 9 points to indicate improvement in functional outcomes Baseline: 28/50 Goal status: IN PROGRESS  3.  Patient will demo improved LT cervical rotation by 10 degrees in order to improve ability to scan environment for safety and while driving. Baseline: 38 deg Goal status: IN PROGRESS  4. Patient will report a decrease in neck pain to no more than 3/10 at worst for improved quality of life and ability to perform UE ADLs  Baseline: 9-10/10 Goal status: IN PROGRESS  PLAN: PT FREQUENCY: 1-2x/week  PT DURATION: 6 weeks  PLANNED INTERVENTIONS: Therapeutic exercises, Therapeutic activity, Neuromuscular re-education, Balance training, Gait training, Patient/Family education, Joint manipulation, Joint mobilization, Stair training, Aquatic Therapy, Dry Needling, Electrical stimulation, Spinal manipulation, Spinal mobilization, Cryotherapy, Moist heat, scar mobilization, Taping, Traction, Ultrasound, Biofeedback, Ionotophoresis 4mg /ml Dexamethasone, and Manual therapy.   PLAN FOR NEXT SESSION: Progress pain free cervical AROM, and postural strength as tolerated. Manual STM as needed for pain and restriction.   9:47 AM, 07/17/22 Josue Hector PT DPT  Physical Therapist with [facility]  [phone]

## 2022-07-24 ENCOUNTER — Encounter (HOSPITAL_COMMUNITY): Payer: Medicaid Other

## 2022-07-26 ENCOUNTER — Encounter (HOSPITAL_COMMUNITY)
Admission: RE | Admit: 2022-07-26 | Discharge: 2022-07-26 | Disposition: A | Discharge status: Home / Self Care | Payer: Medicaid Other | Source: Ambulatory Visit | Attending: Internal Medicine | Admitting: Internal Medicine

## 2022-07-26 ENCOUNTER — Telehealth (HOSPITAL_COMMUNITY): Payer: Self-pay

## 2022-07-26 ENCOUNTER — Encounter (HOSPITAL_COMMUNITY): Payer: Self-pay

## 2022-07-26 NOTE — Telephone Encounter (Signed)
Spoke with patient regarding missed appointment Monday 10/30; states her kids were out of school and simply forgot about her appointment and overslept.  Plans to keep her next appointment.  8:54 AM, 07/26/22 Chesnee Floren Small Valorie Mcgrory MPT [facility] [place] [phone]

## 2022-07-31 ENCOUNTER — Encounter (HOSPITAL_COMMUNITY): Payer: Medicaid Other | Admitting: Physical Therapy

## 2022-07-31 ENCOUNTER — Encounter (HOSPITAL_COMMUNITY)
Admission: RE | Disposition: A | Discharge status: Home / Self Care | Payer: Self-pay | Source: Home / Self Care | Attending: Internal Medicine

## 2022-07-31 ENCOUNTER — Ambulatory Visit (HOSPITAL_COMMUNITY): Payer: Medicaid Other | Admitting: Anesthesiology

## 2022-07-31 ENCOUNTER — Ambulatory Visit (HOSPITAL_BASED_OUTPATIENT_CLINIC_OR_DEPARTMENT_OTHER): Payer: Medicaid Other | Admitting: Anesthesiology

## 2022-07-31 ENCOUNTER — Ambulatory Visit (HOSPITAL_COMMUNITY)
Admission: RE | Admit: 2022-07-31 | Discharge: 2022-07-31 | Disposition: A | Discharge status: Home / Self Care | Payer: Medicaid Other | Attending: Internal Medicine | Admitting: Internal Medicine

## 2022-07-31 DIAGNOSIS — R159 Full incontinence of feces: Secondary | ICD-10-CM | POA: Insufficient documentation

## 2022-07-31 DIAGNOSIS — K648 Other hemorrhoids: Secondary | ICD-10-CM | POA: Diagnosis not present

## 2022-07-31 DIAGNOSIS — I10 Essential (primary) hypertension: Secondary | ICD-10-CM | POA: Insufficient documentation

## 2022-07-31 DIAGNOSIS — K529 Noninfective gastroenteritis and colitis, unspecified: Secondary | ICD-10-CM | POA: Diagnosis not present

## 2022-07-31 DIAGNOSIS — R151 Fecal smearing: Secondary | ICD-10-CM

## 2022-07-31 DIAGNOSIS — K633 Ulcer of intestine: Secondary | ICD-10-CM | POA: Insufficient documentation

## 2022-07-31 DIAGNOSIS — K219 Gastro-esophageal reflux disease without esophagitis: Secondary | ICD-10-CM | POA: Diagnosis not present

## 2022-07-31 DIAGNOSIS — G8929 Other chronic pain: Secondary | ICD-10-CM

## 2022-07-31 DIAGNOSIS — R197 Diarrhea, unspecified: Secondary | ICD-10-CM

## 2022-07-31 DIAGNOSIS — Z8719 Personal history of other diseases of the digestive system: Secondary | ICD-10-CM | POA: Insufficient documentation

## 2022-07-31 HISTORY — PX: COLONOSCOPY WITH PROPOFOL: SHX5780

## 2022-07-31 HISTORY — PX: BIOPSY: SHX5522

## 2022-07-31 SURGERY — COLONOSCOPY WITH PROPOFOL
Anesthesia: General

## 2022-07-31 MED ORDER — LIDOCAINE HCL (CARDIAC) PF 100 MG/5ML IV SOSY
PREFILLED_SYRINGE | INTRAVENOUS | Status: DC | PRN
  Administered 2022-07-31: 100 mg via INTRAVENOUS

## 2022-07-31 MED ORDER — STERILE WATER FOR IRRIGATION IR SOLN
Status: DC | PRN
  Administered 2022-07-31: .6 mL

## 2022-07-31 MED ORDER — LACTATED RINGERS IV SOLN: INTRAVENOUS | Status: DC

## 2022-07-31 MED ORDER — PROPOFOL 500 MG/50ML IV EMUL
INTRAVENOUS | Status: DC | PRN
  Administered 2022-07-31: 150 ug/kg/min via INTRAVENOUS

## 2022-07-31 MED ORDER — PROPOFOL 10 MG/ML IV BOLUS
INTRAVENOUS | Status: DC | PRN
  Administered 2022-07-31: 100 mg via INTRAVENOUS

## 2022-07-31 NOTE — Anesthesia Postprocedure Evaluation (Signed)
Anesthesia Post Note  Patient: JOYCELYNN FRITSCHE  Procedure(s) Performed: COLONOSCOPY WITH PROPOFOL BIOPSY  Patient location during evaluation: Phase II Anesthesia Type: General Level of consciousness: awake and alert and oriented Pain management: pain level controlled Vital Signs Assessment: post-procedure vital signs reviewed and stable Respiratory status: spontaneous breathing, nonlabored ventilation and respiratory function stable Cardiovascular status: blood pressure returned to baseline and stable Postop Assessment: no apparent nausea or vomiting Anesthetic complications: no  No notable events documented.   Last Vitals:  Vitals:   07/31/22 0915 07/31/22 1027  BP:  118/78  Pulse: 69 76  Resp: 17 18  Temp:  36.4 C  SpO2: 97% 98%    Last Pain:  Vitals:   07/31/22 1027  TempSrc: Oral  PainSc:                  [place] C Akaysha Cobern

## 2022-07-31 NOTE — Transfer of Care (Signed)
Immediate Anesthesia Transfer of Care Note  Patient: Ruth Shah  Procedure(s) Performed: COLONOSCOPY WITH PROPOFOL BIOPSY  Patient Location: Short Stay  Anesthesia Type:General  Level of Consciousness: awake, alert , oriented, and patient cooperative  Airway & Oxygen Therapy: Patient Spontanous Breathing  Post-op Assessment: Report given to RN, Post -op Vital signs reviewed and stable, and Patient moving all extremities X 4  Post vital signs: Reviewed and stable  Last Vitals:  Vitals Value Taken Time  BP 118/78 07/31/22 1027  Temp 36.4 C 07/31/22 1027  Pulse 76 07/31/22 1027  Resp 18 07/31/22 1027  SpO2 98 % 07/31/22 1027    Last Pain:  Vitals:   07/31/22 1027  TempSrc: Oral  PainSc:          Complications: No notable events documented.

## 2022-07-31 NOTE — H&P (Signed)
Primary Care Physician:  [facility] Gastroenterologist:  Dr. Marletta Lor  Pre-Procedure History & Physical: HPI:  Ruth Shah is a 46 y.o. female is here for a colonoscopy to be performed for chronic diarrhea, fecal smearing.  Past Medical History:  Diagnosis Date   ADD (attention deficit disorder)    Arthritis    Asthma    mild seasonal reactions to mold and mildew   Back pain    Depression    Family history of adverse reaction to anesthesia    mother has severe PONV   Fatty liver    Fibromyalgia    GERD (gastroesophageal reflux disease)    Herpes simplex disease    History of kidney stones    Hypertension    Migraines    Paresthesia of both hands    PONV (postoperative nausea and vomiting)    Urticaria     Past Surgical History:  Procedure Laterality Date   BIOPSY  09/23/2019   Procedure: BIOPSY;  Surgeon: West Bali, MD;  Location: [facility];  Service: Endoscopy;;  esophagus   CHOLECYSTECTOMY N/A 04/08/2020   Procedure: LAPAROSCOPIC CHOLECYSTECTOMY;  Surgeon: Franky Macho, MD;  Location: [facility];  Service: General;  Laterality: N/A;   collapsed lung     after rotator cuff surgery   ENDOMETRIAL ABLATION     ESOPHAGEAL MANOMETRY N/A 10/15/2017   Procedure: ESOPHAGEAL MANOMETRY (EM);  Surgeon: Napoleon Form, MD;  Location: [facility];  Service: Endoscopy;  Laterality: N/A;   ESOPHAGOGASTRODUODENOSCOPY (EGD) WITH PROPOFOL N/A 12/26/2016   Dr. Darrick Penna: one benign-appearing, intrinsic stenosis that was widely patent and traversed. Small hiatal hernia, few small sessile polyps. Chronic gastritis. Normal duodenum, negative celiac sprue.    ESOPHAGOGASTRODUODENOSCOPY (EGD) WITH PROPOFOL N/A 09/23/2019   Surgeon: West Bali, MD;  1 benign-appearing esophageal stenosis s/p dilation, esophagus was biopsied, normal examined stomach and duodenum.  Pathology with mild reactive changes, no increased intraepithelial eosinophils.   HERNIA  REPAIR     inguinal as a baby    left ovarian removal due to cyst     PILONIDAL CYST / SINUS EXCISION  2009   RECTOCELE REPAIR N/A 10/02/2017   Procedure: POSTERIOR REPAIR (RECTOCELE);  Surgeon: Tilda Burrow, MD;  Location: [facility];  Service: Gynecology;  Laterality: N/A;   SAVORY DILATION N/A 09/23/2019   Procedure: SAVORY DILATION;  Surgeon: West Bali, MD;  Location: [facility];  Service: Endoscopy;  Laterality: N/A;   SHOULDER ARTHROSCOPY WITH ROTATOR CUFF REPAIR Right 09/22/2021   Procedure: SHOULDER ARTHROSCOPY WITH ROTATOR CUFF REPAIR, BICEPS TENOTOMY, SUBACROMIAL DECOMPRESSION, DEBRIDEMENT;  Surgeon: Yolonda Kida, MD;  Location: [facility] OR;  Service: Orthopedics;  Laterality: Right;  120   SINOSCOPY     TUBAL LIGATION     VAGINAL HYSTERECTOMY N/A 10/02/2017   Procedure: HYSTERECTOMY VAGINAL;  Surgeon: Tilda Burrow, MD;  Location: [facility];  Service: Gynecology;  Laterality: N/A;    Prior to Admission medications   Medication Sig Start Date End Date Taking? Authorizing Provider  acetaminophen (TYLENOL) 500 MG tablet Take 1,000 mg by mouth every 6 (six) hours as needed for moderate pain or mild pain.   Yes [provider]  Cholecalciferol (VITAMIN D3) 1.25 MG (50000 UT) CAPS Take 50,000 Units by mouth once a week.   Yes [provider]  FLUoxetine (PROZAC) 40 MG capsule Take 40 mg by mouth daily.   Yes [provider]  ibuprofen (ADVIL) 200 MG tablet Take 400  mg by mouth every 6 (six) hours as needed for headache or moderate pain.   Yes [provider]  lisinopril-hydrochlorothiazide (ZESTORETIC) 20-25 MG tablet Take 1 tablet by mouth daily. 04/03/22  Yes [provider]  methylphenidate 27 MG PO CR tablet Take 27 mg by mouth daily.   Yes [provider]  pantoprazole (PROTONIX) 40 MG tablet Take 1 tablet (40 mg total) by mouth 2 (two) times daily before a meal. 01/06/20  Yes Anice Paganini, NP  propranolol ER (INDERAL  LA) 80 MG 24 hr capsule Take 80 mg by mouth at bedtime.  Patient not taking: Reported on 07/20/2022 01/13/19   [provider]    Allergies as of 06/29/2022 - Review Complete 06/26/2022  Allergen Reaction Noted   Dog epithelium allergy skin test Cough 06/26/2022   Morphine and related Itching 09/22/2021   Dilaudid [hydromorphone hcl] Nausea And Vomiting 10/10/2017   Duloxetine Other (See Comments) 06/13/2018   Sulfa antibiotics Nausea Only 07/20/2016   Toradol [ketorolac tromethamine] Nausea Only 07/20/2016   Vicodin [hydrocodone-acetaminophen] Itching 08/12/2015    Family History  Problem Relation Age of Onset   Cancer Mother        skin   Depression Mother    Hypertension Mother    Hyperlipidemia Mother    Diabetes Mother    Cancer Father        skin   Arthritis Father    Hypertension Father    Hyperlipidemia Father    Asthma Brother    Depression Maternal Grandmother    Hypertension Maternal Grandmother    Mental illness Maternal Grandmother    Cancer Maternal Grandfather        kidney   Hypertension Maternal Grandfather    Kidney disease Maternal Grandfather    Emphysema Paternal Grandmother    Alzheimer's disease Paternal Grandmother    Parkinson's disease Paternal Grandfather    Colon cancer Neg Hx    Colon polyps Neg Hx    Allergic rhinitis Neg Hx    Eczema Neg Hx    Urticaria Neg Hx     Social History   Socioeconomic History   Marital status: Married    Spouse name: Not on file   Number of children: 4   Years of education: Some College   Highest education level: Not on file  Occupational History   Not on file  Tobacco Use   Smoking status: Never   Smokeless tobacco: Never  Vaping Use   Vaping Use: Never used  Substance and Sexual Activity   Alcohol use: No    Alcohol/week: 0.0 standard drinks of alcohol   Drug use: No   Sexual activity: Yes    Birth control/protection: Surgical    Comment: hyst  Other Topics Concern   Not on file   Social History Narrative   Lives at home with husband and children.   Right-handed.   4 cups caffeine per week.   Social Determinants of Health   Financial Resource Strain: Not on file  Food Insecurity: Not on file  Transportation Needs: Not on file  Physical Activity: Not on file  Stress: Not on file  Social Connections: Not on file  Intimate Partner Violence: Not on file    Review of Systems: See HPI, otherwise negative ROS  Physical Exam: Vital signs in last 24 hours: Temp:  [98.4 F (36.9 C)] 98.4 F (36.9 C) (11/06 0907) Pulse Rate:  [69] 69 (11/06 0915) Resp:  [14-17] 17 (11/06 0915) BP: (140)/(93)  140/93 (11/06 0907) SpO2:  [97 %] 97 % (11/06 0915)   General:   Alert,  Well-developed, well-nourished, pleasant and cooperative in NAD Head:  Normocephalic and atraumatic. Eyes:  Sclera clear, no icterus.   Conjunctiva pink. Ears:  Normal auditory acuity. Nose:  No deformity, discharge,  or lesions. Mouth:  No deformity or lesions, dentition normal. Neck:  Supple; no masses or thyromegaly. Lungs:  Clear throughout to auscultation.   No wheezes, crackles, or rhonchi. No acute distress. Heart:  Regular rate and rhythm; no murmurs, clicks, rubs,  or gallops. Abdomen:  Soft, nontender and nondistended. No masses, hepatosplenomegaly or hernias noted. Normal bowel sounds, without guarding, and without rebound.   Msk:  Symmetrical without gross deformities. Normal posture. Extremities:  Without clubbing or edema. Neurologic:  Alert and  oriented x4;  grossly normal neurologically. Skin:  Intact without significant lesions or rashes. Cervical Nodes:  No significant cervical adenopathy. Psych:  Alert and cooperative. Normal mood and affect.  Impression/Plan: Ruth Shah is here for a colonoscopy to be performed for chronic diarrhea, fecal smearing.  The risks of the procedure including infection, bleed, or perforation as well as benefits, limitations, alternatives and  imponderables have been reviewed with the patient. Questions have been answered. All parties agreeable.

## 2022-07-31 NOTE — Anesthesia Preprocedure Evaluation (Signed)
Anesthesia Evaluation  Patient identified by MRN, date of birth, ID band Patient awake    Reviewed: Allergy & Precautions, H&P , NPO status , Patient's Chart, lab work & pertinent test results, reviewed documented beta blocker date and time   History of Anesthesia Complications (+) PONV, Family history of anesthesia reaction and history of anesthetic complications  Airway Mallampati: II  TM Distance: >3 FB Neck ROM: Full    Dental  (+) Dental Advisory Given, Teeth Intact   Pulmonary asthma    Pulmonary exam normal breath sounds clear to auscultation       Cardiovascular Exercise Tolerance: Good hypertension, Pt. on medications and Pt. on home beta blockers Normal cardiovascular exam Rhythm:Regular Rate:Normal     Neuro/Psych  Headaches PSYCHIATRIC DISORDERS  Depression     Neuromuscular disease    GI/Hepatic Neg liver ROS, hiatal hernia,GERD  Medicated and Controlled,,  Endo/Other  negative endocrine ROS    Renal/GU negative Renal ROS  negative genitourinary   Musculoskeletal  (+) Arthritis , Osteoarthritis,  Fibromyalgia -  Abdominal   Peds  (+) ATTENTION DEFICIT DISORDER WITHOUT HYPERACTIVITY Hematology negative hematology ROS (+)   Anesthesia Other Findings   Reproductive/Obstetrics negative OB ROS                             Anesthesia Physical Anesthesia Plan  ASA: 2  Anesthesia Plan: General   Post-op Pain Management: Minimal or no pain anticipated   Induction: Intravenous  PONV Risk Score and Plan: 1 and Propofol infusion  Airway Management Planned: Nasal Cannula and Natural Airway  Additional Equipment:   Intra-op Plan:   Post-operative Plan:   Informed Consent: I have reviewed the patients History and Physical, chart, labs and discussed the procedure including the risks, benefits and alternatives for the proposed anesthesia with the patient or authorized  representative who has indicated his/her understanding and acceptance.     Dental advisory given  Plan Discussed with: CRNA and Surgeon  Anesthesia Plan Comments:         Anesthesia Quick Evaluation

## 2022-07-31 NOTE — Discharge Instructions (Addendum)
  Colonoscopy Discharge Instructions  Read the instructions outlined below and refer to this sheet in the next few weeks. These discharge instructions provide you with general information on caring for yourself after you leave the hospital. Your doctor may also give you specific instructions. While your treatment has been planned according to the most current medical practices available, unavoidable complications occasionally occur.   ACTIVITY You may resume your regular activity, but move at a slower pace for the next 24 hours.  Take frequent rest periods for the next 24 hours.  Walking will help get rid of the air and reduce the bloated feeling in your belly (abdomen).  No driving for 24 hours (because of the medicine (anesthesia) used during the test).   Do not sign any important legal documents or operate any machinery for 24 hours (because of the anesthesia used during the test).  NUTRITION Drink plenty of fluids.  You may resume your normal diet as instructed by your doctor.  Begin with a light meal and progress to your normal diet. Heavy or fried foods are harder to digest and may make you feel sick to your stomach (nauseated).  Avoid alcoholic beverages for 24 hours or as instructed.  MEDICATIONS You may resume your normal medications unless your doctor tells you otherwise.  WHAT YOU CAN EXPECT TODAY Some feelings of bloating in the abdomen.  Passage of more gas than usual.  Spotting of blood in your stool or on the toilet paper.  IF YOU HAD POLYPS REMOVED DURING THE COLONOSCOPY: No aspirin products for 7 days or as instructed.  No alcohol for 7 days or as instructed.  Eat a soft diet for the next 24 hours.  FINDING OUT THE RESULTS OF YOUR TEST Not all test results are available during your visit. If your test results are not back during the visit, make an appointment with your caregiver to find out the results. Do not assume everything is normal if you have not heard from your  caregiver or the medical facility. It is important for you to follow up on all of your test results.  SEEK IMMEDIATE MEDICAL ATTENTION IF: You have more than a spotting of blood in your stool.  Your belly is swollen (abdominal distention).  You are nauseated or vomiting.  You have a temperature over 101.  You have abdominal pain or discomfort that is severe or gets worse throughout the day.   I did not see any evidence of polyps or colon cancer throughout your entire colon.  You did have a single solitary ulcer in the right side your colon.  These are typically from NSAID use.  Would recommend you limit NSAIDs.  I did take biopsies of it.  Also took biopsies of your colon to rule out a condition called microscopic colitis which can cause chronically loose stools especially in women.  Await pathology results, my office will contact you.  Repeat colonoscopy in 10 years for screening purposes.  You do have internal hemorrhoids.  We can consider treating these with banding in the office which may help with your stool leakage.  Recommend taking Metamucil daily.  Follow-up with GI in 3 months.  I hope you have a great rest of your week!  Elon Alas. Abbey Chatters, D.O. Gastroenterology and Hepatology [facility]

## 2022-07-31 NOTE — Op Note (Signed)
[facility] Patient Name: Ruth Shah Procedure Date: 07/31/2022 9:54 AM MRN: [id number] Date of Birth: 11/07/1975 Attending MD: Elon Alas. Abbey Chatters , [place], [id number] CSN: [id number] Age: 46 Admit Type: Outpatient Procedure:                Colonoscopy Indications:              Chronic diarrhea, Fecal incontinence Providers:                Elon Alas. Abbey Chatters, DO, Janeece Riggers, RN, Kristine L.                            Risa Grill, Technician Referring MD:              Medicines:                See the Anesthesia note for documentation of the                            administered medications Complications:            No immediate complications. Estimated Blood Loss:     Estimated blood loss was minimal. Procedure:                Pre-Anesthesia Assessment:                           - The anesthesia plan was to use monitored                            anesthesia care (MAC).                           After obtaining informed consent, the colonoscope                            was passed under direct vision. Throughout the                            procedure, the patient's blood pressure, pulse, and                            oxygen saturations were monitored continuously. The                            [id number]-HQ190L ([id number]) scope was introduced through                            the anus and advanced to the the cecum, identified                            by appendiceal orifice and ileocecal valve. The                            colonoscopy was performed without difficulty. The                            patient tolerated the procedure  well. The quality                            of the bowel preparation was evaluated using the                            BBPS [facility] Bowel Preparation Scale) with scores                            of: Right Colon = 3, Transverse Colon = 3 and Left                            Colon = 3 (entire mucosa seen well with no residual                             staining, small fragments of stool or opaque                            liquid). The total BBPS score equals 9. Scope In: 10:03:55 AM Scope Out: 10:21:57 AM Scope Withdrawal Time: 0 hours 13 minutes 51 seconds  Total Procedure Duration: 0 hours 18 minutes 2 seconds  Findings:      The perianal and digital rectal examinations were normal.      Non-bleeding internal hemorrhoids were found during endoscopy.      A single (solitary) five mm ulcer was found in the cecum. No bleeding       was present. No stigmata of recent bleeding were seen. Biopsies were       taken with a cold forceps for histology.      Biopsies for histology were taken with a cold forceps from the ascending       colon, transverse colon and descending colon for evaluation of       microscopic colitis.      The terminal ileum appeared normal. Impression:               - Non-bleeding internal hemorrhoids.                           - A single (solitary) ulcer in the cecum. Biopsied.                           - The examined portion of the ileum was normal.                           - Biopsies were taken with a cold forceps from the                            ascending colon, transverse colon and descending                            colon for evaluation of microscopic colitis. Moderate Sedation:      Per Anesthesia Care Recommendation:           - Patient has a contact number available for  emergencies. The signs and symptoms of potential                            delayed complications were discussed with the                            patient. Return to normal activities tomorrow.                            Written discharge instructions were provided to the                            patient.                           - Resume previous diet.                           - Continue present medications.                           - Await pathology results.                           - Repeat  colonoscopy in 10 years for screening                            purposes.                           - No ibuprofen, naproxen, or other non-steroidal                            anti-inflammatory drugs. Procedure Code(s):        --- Professional ---                           [phone], Colonoscopy, flexible; with biopsy, single                            or multiple Diagnosis Code(s):        --- Professional ---                           K63.3, Ulcer of intestine                           K64.8, Other hemorrhoids                           K52.9, Noninfective gastroenteritis and colitis,                            unspecified                           R15.9, Full incontinence of feces CPT copyright 2022 American Medical Association. All rights reserved. The codes documented in this report are  preliminary and upon coder review may  be revised to meet current compliance requirements. Elon Alas. Abbey Chatters, DO Miles Abbey Chatters, DO 07/31/2022 10:24:02 AM This report has been signed electronically. Number of Addenda: 0

## 2022-08-02 LAB — SURGICAL PATHOLOGY

## 2022-08-03 ENCOUNTER — Emergency Department (HOSPITAL_COMMUNITY)
Admission: EM | Admit: 2022-08-03 | Discharge: 2022-08-04 | Disposition: A | Discharge status: Home / Self Care | Payer: Medicaid Other | Attending: Emergency Medicine | Admitting: Emergency Medicine

## 2022-08-03 ENCOUNTER — Encounter (HOSPITAL_COMMUNITY): Payer: Self-pay

## 2022-08-03 ENCOUNTER — Other Ambulatory Visit: Payer: Self-pay

## 2022-08-03 ENCOUNTER — Encounter (HOSPITAL_COMMUNITY): Payer: Medicaid Other | Admitting: Physical Therapy

## 2022-08-03 DIAGNOSIS — G4489 Other headache syndrome: Secondary | ICD-10-CM | POA: Diagnosis not present

## 2022-08-03 DIAGNOSIS — J45909 Unspecified asthma, uncomplicated: Secondary | ICD-10-CM | POA: Diagnosis not present

## 2022-08-03 DIAGNOSIS — Z87442 Personal history of urinary calculi: Secondary | ICD-10-CM | POA: Insufficient documentation

## 2022-08-03 DIAGNOSIS — Z79899 Other long term (current) drug therapy: Secondary | ICD-10-CM | POA: Insufficient documentation

## 2022-08-03 DIAGNOSIS — I10 Essential (primary) hypertension: Secondary | ICD-10-CM | POA: Diagnosis not present

## 2022-08-03 DIAGNOSIS — R519 Headache, unspecified: Secondary | ICD-10-CM | POA: Diagnosis present

## 2022-08-03 NOTE — ED Triage Notes (Signed)
Pt noticed she had HTN  @ home with Headache that is sharp in back of head. Pt ook a.m. BP meds but not the night BP med yet.

## 2022-08-04 MED ORDER — PROCHLORPERAZINE EDISYLATE 10 MG/2ML IJ SOLN
10.0000 mg | Freq: Once | INTRAMUSCULAR | Status: AC
  Administered 2022-08-04: 10 mg via INTRAVENOUS
  Filled 2022-08-04: qty 2

## 2022-08-04 NOTE — Discharge Instructions (Addendum)
Please call the heart doctors that are listed in the next week for follow-up on your blood pressure.  Be sure to take your medications that are due tonight

## 2022-08-04 NOTE — ED Provider Notes (Signed)
[facility] EMERGENCY DEPARTMENT Provider Note   CSN: [id number] Arrival date & time: 08/03/22  2301     History  Chief Complaint  Patient presents with   Hypertension   Headache    Ruth Shah is a 46 y.o. female.  The history is provided by the patient.  Patient with history of hypertension, frequent headaches, fibromyalgia presents with headache and elevated blood pressure.  She reports over the past several hours she has had left-sided headache that is gradually worsening.  She reports the skin on her scalp is very sensitive.  No recent head trauma.  No fevers or vomiting.  No focal neurodeficits.  No slurred speech.  No visual changes.  No recent travel.  No tick bites.  No one else at home has these headaches. She reports frequent episodes of headache but typically in the frontal region. She reports her blood pressures have been difficult to manage she has been having her medications altered by her PCP. Denies previous history of stroke    Past Medical History:  Diagnosis Date   ADD (attention deficit disorder)    Arthritis    Asthma    mild seasonal reactions to mold and mildew   Back pain    Depression    Family history of adverse reaction to anesthesia    mother has severe PONV   Fatty liver    Fibromyalgia    GERD (gastroesophageal reflux disease)    Herpes simplex disease    History of kidney stones    Hypertension    Migraines    Paresthesia of both hands    PONV (postoperative nausea and vomiting)    Urticaria     Home Medications Prior to Admission medications   Medication Sig Start Date End Date Taking? Authorizing Provider  acetaminophen (TYLENOL) 500 MG tablet Take 1,000 mg by mouth every 6 (six) hours as needed for moderate pain or mild pain.    [provider]  Cholecalciferol (VITAMIN D3) 1.25 MG (50000 UT) CAPS Take 50,000 Units by mouth once a week.    [provider]  FLUoxetine (PROZAC) 40 MG capsule Take 40 mg by mouth  daily.    [provider]  ibuprofen (ADVIL) 200 MG tablet Take 400 mg by mouth every 6 (six) hours as needed for headache or moderate pain.    [provider]  lisinopril-hydrochlorothiazide (ZESTORETIC) 20-25 MG tablet Take 1 tablet by mouth daily. 04/03/22   [provider]  methylphenidate 27 MG PO CR tablet Take 27 mg by mouth daily.    [provider]  pantoprazole (PROTONIX) 40 MG tablet Take 1 tablet (40 mg total) by mouth 2 (two) times daily before a meal. 01/06/20   Anice Paganini, NP  propranolol ER (INDERAL LA) 80 MG 24 hr capsule Take 80 mg by mouth at bedtime.  Patient not taking: Reported on 07/20/2022 01/13/19   [provider]      Allergies    Dog epithelium allergy skin test, Morphine and related, Dilaudid [hydromorphone hcl], Duloxetine, Sulfa antibiotics, Toradol [ketorolac tromethamine], and Vicodin [hydrocodone-acetaminophen]    Review of Systems   Review of Systems  Constitutional:  Negative for fever.  Eyes:  Negative for visual disturbance.    Physical Exam Updated Vital Signs BP (!) 169/101   Pulse 81   Temp 98.8 F (37.1 C) (Oral)   Resp (!) 21   Ht 1.6 m (5\' 3" )   Wt 83.9 kg   SpO2 99%  BMI 32.77 kg/m  Physical Exam CONSTITUTIONAL: Well developed/well nourished HEAD: Normocephalic/atraumatic, EYES: EOMI/PERRL, no nystagmus, no ptosis, normal fundoscopic exam (no papilledema) no rash, no step-offs, no evidence of trauma to her scalp ENMT: Mucous membranes moist NECK: supple no meningeal signs, no bruits SPINE/BACK:entire spine nontender CV: S1/S2 noted, no murmurs/rubs/gallops noted LUNGS: Lungs are clear to auscultation bilaterally, no apparent distress ABDOMEN: soft, nontender, no rebound or guarding GU:no cva tenderness NEURO:Awake/alert, face symmetric, no arm or leg drift is noted Equal 5/5 strength with shoulder abduction, elbow flex/extension, wrist flex/extension in upper extremities and equal hand  grips bilaterally Equal 5/5 strength with hip flexion,knee flex/extension, foot dorsi/plantar flexion Cranial nerves 3/4/5/6/04/02/09/11/12 tested and intact Gait normal without ataxia No past pointing Sensation to light touch intact in all extremities EXTREMITIES: pulses normal, full ROM SKIN: warm, color normal PSYCH: no abnormalities of mood noted, alert and oriented to situation  ED Results / Procedures / Treatments   Labs (all labs ordered are listed, but only abnormal results are displayed) Labs Reviewed - No data to display  EKG EKG Interpretation  Date/Time:  Thursday August 03 2022 23:53:25 EST Ventricular Rate:  68 PR Interval:  166 QRS Duration: 80 QT Interval:  407 QTC Calculation: 433 R Axis:   46 Text Interpretation: Sinus rhythm Probable left atrial enlargement Confirmed by Zadie Rhine ([id number]) on 08/04/2022 1:09:53 AM  Radiology No results found.  Procedures Procedures    Medications Ordered in ED Medications  prochlorperazine (COMPAZINE) injection 10 mg (10 mg Intravenous Given 08/04/22 0021)    ED Course/ Medical Decision Making/ A&P Clinical Course as of 08/04/22 0110  Fri Aug 04, 2022  0109 Patient well-appearing, no acute distress.  Reports headache is resolving.  Will discharge home [DW]  0109 Patient reports long history of hypertension and frequent headaches.  She has no acute neurodeficits.  No indication for imaging at this time.  I have low suspicion for acute neurologic emergency at this time [DW]    Clinical Course User Index [DW] Zadie Rhine, MD                           Medical Decision Making Risk Prescription drug management.   This patient presents to the ED for concern of headache, this involves an extensive number of treatment options, and is a complaint that carries with it a high risk of complications and morbidity.  The differential diagnosis includes but is not limited to subarachnoid hemorrhage, intracranial  hemorrhage, meningitis, encephalitis, CVST, temporal arteritis, idiopathic intracranial hypertension, migraine    Comorbidities that complicate the patient evaluation: Patient's presentation is complicated by their history of hypertension  Additional history obtained: Records reviewed Care Everywhere/External Records  Medicines ordered and prescription drug management: I ordered medication including Compazine for headache Reevaluation of the patient after these medicines showed that the patient    improved  Test Considered: I considered neuroimaging, the patient reports frequent history of these headaches and declines any testing at this time  Reevaluation: After the interventions noted above, I reevaluated the patient and found that they have :improved  Complexity of problems addressed: Patient's presentation is most consistent with  acute presentation with potential threat to life or bodily function  Disposition: After consideration of the diagnostic results and the patient's response to treatment,  I feel that the patent would benefit from discharge   .           Final Clinical Impression(s) / ED Diagnoses  Final diagnoses:  Primary hypertension  Other headache syndrome    Rx / DC Orders ED Discharge Orders     None         Zadie Rhine, MD 08/04/22 0110

## 2022-08-07 ENCOUNTER — Encounter (HOSPITAL_COMMUNITY): Payer: Self-pay | Admitting: Internal Medicine

## 2022-08-07 ENCOUNTER — Ambulatory Visit (HOSPITAL_COMMUNITY): Payer: Medicaid Other | Attending: Orthopedic Surgery | Admitting: Physical Therapy

## 2022-08-07 DIAGNOSIS — M542 Cervicalgia: Secondary | ICD-10-CM | POA: Diagnosis present

## 2022-08-07 NOTE — Therapy (Addendum)
OUTPATIENT PHYSICAL THERAPY CERVICAL EVALUATION   Patient Name: Ruth Shah MRN: [id number] DOB:June 22, 1976, 46 y.o., female Today's Date: 08/07/2022 PHYSICAL THERAPY DISCHARGE SUMMARY  Visits from Start of Care: 5  Current functional level related to goals / functional outcomes: See below   Remaining deficits: See below    Education / Equipment: See assessment   Patient agrees to discharge. Patient goals were partially met. Patient is being discharged due to being pleased with the current functional level.   PT End of Session - 08/07/22 0859     Visit Number 5    Number of Visits 9    Date for PT Re-Evaluation 08/07/22    Authorization Type Medicaid [employer]    Authorization Time Period [employer] approved 10 visits from 06/12/22 to 08/11/22    Authorization - Visit Number 3    Authorization - Number of Visits 10    PT Start Time 0900    PT Stop Time 0930    PT Time Calculation (min) 30 min    Activity Tolerance Patient tolerated treatment well    Behavior During Therapy WFL for tasks assessed/performed               Past Medical History:  Diagnosis Date   ADD (attention deficit disorder)    Arthritis    Asthma    mild seasonal reactions to mold and mildew   Back pain    Depression    Family history of adverse reaction to anesthesia    mother has severe PONV   Fatty liver    Fibromyalgia    GERD (gastroesophageal reflux disease)    Herpes simplex disease    History of kidney stones    Hypertension    Migraines    Paresthesia of both hands    PONV (postoperative nausea and vomiting)    Urticaria    Past Surgical History:  Procedure Laterality Date   BIOPSY  09/23/2019   Procedure: BIOPSY;  Surgeon: Danie Binder, MD;  Location: [facility];  Service: Endoscopy;;  esophagus   BIOPSY  07/31/2022   Procedure: BIOPSY;  Surgeon: Eloise Harman, DO;  Location: [facility];  Service: Endoscopy;;   CHOLECYSTECTOMY N/A 04/08/2020   Procedure:  LAPAROSCOPIC CHOLECYSTECTOMY;  Surgeon: Aviva Signs, MD;  Location: [facility];  Service: General;  Laterality: N/A;   collapsed lung     after rotator cuff surgery   COLONOSCOPY WITH PROPOFOL N/A 07/31/2022   Procedure: COLONOSCOPY WITH PROPOFOL;  Surgeon: Eloise Harman, DO;  Location: [facility];  Service: Endoscopy;  Laterality: N/A;  10:30 am   ENDOMETRIAL ABLATION     ESOPHAGEAL MANOMETRY N/A 10/15/2017   Procedure: ESOPHAGEAL MANOMETRY (EM);  Surgeon: Mauri Pole, MD;  Location: [facility];  Service: Endoscopy;  Laterality: N/A;   ESOPHAGOGASTRODUODENOSCOPY (EGD) WITH PROPOFOL N/A 12/26/2016   Dr. Oneida Alar: one benign-appearing, intrinsic stenosis that was widely patent and traversed. Small hiatal hernia, few small sessile polyps. Chronic gastritis. Normal duodenum, negative celiac sprue.    ESOPHAGOGASTRODUODENOSCOPY (EGD) WITH PROPOFOL N/A 09/23/2019   Surgeon: Danie Binder, MD;  1 benign-appearing esophageal stenosis s/p dilation, esophagus was biopsied, normal examined stomach and duodenum.  Pathology with mild reactive changes, no increased intraepithelial eosinophils.   HERNIA REPAIR     inguinal as a baby    left ovarian removal due to cyst     PILONIDAL CYST / SINUS EXCISION  2009   RECTOCELE REPAIR N/A 10/02/2017   Procedure: POSTERIOR REPAIR (  RECTOCELE);  Surgeon: Jonnie Kind, MD;  Location: [facility];  Service: Gynecology;  Laterality: N/A;   SAVORY DILATION N/A 09/23/2019   Procedure: SAVORY DILATION;  Surgeon: Danie Binder, MD;  Location: [facility];  Service: Endoscopy;  Laterality: N/A;   SHOULDER ARTHROSCOPY WITH ROTATOR CUFF REPAIR Right 09/22/2021   Procedure: SHOULDER ARTHROSCOPY WITH ROTATOR CUFF REPAIR, BICEPS TENOTOMY, SUBACROMIAL DECOMPRESSION, DEBRIDEMENT;  Surgeon: Nicholes Stairs, MD;  Location: [facility];  Service: Orthopedics;  Laterality: Right;  120   SINOSCOPY     TUBAL LIGATION     VAGINAL HYSTERECTOMY N/A 10/02/2017   Procedure:  HYSTERECTOMY VAGINAL;  Surgeon: Jonnie Kind, MD;  Location: [facility];  Service: Gynecology;  Laterality: N/A;   Patient Active Problem List   Diagnosis Date Noted   Chronic RUQ pain 06/26/2022   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   Fatty liver 09/30/2020   IBS (irritable bowel syndrome) 09/30/2020   Chronic cholecystitis    Radiculopathy due to lumbar intervertebral disc disorder 10/16/2019   Rectal leakage 06/17/2019   Abdominal pain 09/03/2018   Rectal pain 09/03/2018   Mild intermittent asthma, uncomplicated 00/92/3300   Adverse food reaction 04/24/2018   Perennial allergic rhinitis 04/24/2018   Allergic urticaria 04/24/2018   Gastroesophageal reflux disease    Hiatal hernia    Hematometra 10/02/2017   Pelvic pain in female 10/02/2017   Status post vaginal hysterectomy 10/02/2017   Rectal bleeding 07/26/2017   Rectocele, female 06/28/2017   Uterine prolapse 06/28/2017   Diarrhea 04/26/2017   Chronic bilateral low back pain without sciatica 02/15/2017   Dyspepsia 12/21/2016   Dysphagia 12/21/2016   Nausea without vomiting 12/21/2016   Chronic migraine 07/20/2016   Neck pain 07/20/2016   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 Feels that pain is about the same since starting therapy.   PERTINENT HISTORY:  Joint pain, hips, back, shoulder, fibromyalgia   PAIN:  Are you having pain? Yes: NPRS scale: 2/10 Pain location: neck, base of skull  Pain description:  sharp, tight, aching  Aggravating factors: unsure  Relieving factors: meds, heating pad   PRECAUTIONS: None  WEIGHT BEARING RESTRICTIONS No  FALLS:  Has patient fallen in last 6 months? No  LIVING ENVIRONMENT: Lives with: lives with their family and lives with their spouse Lives in: House/apartment Stairs: Yes: External: 3 steps; none Has following equipment at home: None  OCCUPATION: Dog grooming   PLOF: Independent  PATIENT GOALS "Feel better"  OBJECTIVE:   DIAGNOSTIC FINDINGS:  NA   PATIENT SURVEYS:  NDI 22/50 (was 88/50)   COGNITION: Overall cognitive status: Within functional limits for tasks assessed   SENSATION: Reports hx of carpal tunnel   POSTURE: rounded shoulders  PALPATION: Mod TTP about bilat cervical paraspinals, sub occipital region    CERVICAL ROM:   Active ROM A/PROM (deg) eval AROM 08/07/22  Flexion 32 45  Extension 40 18 (compensates with t-spine)  Right lateral flexion    Left lateral flexion    Right rotation 50 65  Left rotation 38 60   (Blank rows = not tested)  UPPER EXTREMITY ROM:  Bilat shoulder AROM WFL   UPPER EXTREMITY MMT:  MMT Right eval Left eval  Shoulder flexion 4+ 4+  Shoulder extension    Shoulder abduction 4 4  Shoulder adduction    Shoulder extension    Shoulder internal rotation 4+ 4+  Shoulder external rotation 4 4  Middle trapezius    Lower trapezius    Elbow flexion    Elbow extension    Wrist flexion    Wrist extension    Wrist ulnar deviation    Wrist radial deviation    Wrist pronation    Wrist supination    Grip strength     (Blank rows = not tested)     TODAY'S TREATMENT:  08/07/22 Reassess  NDI AROM HEP review Band rows RTB x10 Shoulder extension RTB x 10  07/17/22 Supine: Chin tuck 2 x 10  Scap retraction 2 x 10  Shoulder ER with YTB 20 x 3" Shoulder flexion stretch with rod 20 x 3" Shoulder abduction YTB x20  Cervical rotation AROM x20  Manual IASTM to bilat  upper trap and cervical paraspinals   07/03/2022 Supine: moist heat to neck x 10' to decrease pain and improve soft tissue mobility Cervical retractions 2 x 10 Scapular retractions 2 x 10 Manual cervical retractions 10" hold x 10  Sitting: STM to bilateral upper traps and cervical paraspinals x 10'  BP sitting 170/108 right arm  PATIENT EDUCATION:  Education details: on eval findings, POC and HEP  Person educated: Patient Education method: Explanation Education comprehension: verbalized understanding   HOME EXERCISE PROGRAM: Access Code: [email or web address] URL: [email or web address]  07/17/22 - Supine Shoulder External Rotation with Resistance  - 2 x daily - 7 x weekly - 2 sets - 10 reps - Supine Shoulder Horizontal Abduction with Resistance  - 2 x daily - 7 x weekly - 2 sets - 10 reps - Supine Cervical Retraction with Towel  - 2 x daily - 7 x weekly - 2 sets - 10 reps - Supine Scapular Retraction  - 2 x daily - 7 x weekly - 2 sets - 10 reps  Date: 06/12/2022 Prepared by: Josue Hector  Exercises - Seated Scapular Retraction  - 2-3 x daily - 7 x weekly - 1 sets - 10 reps - 5 second hold - Seated Cervical Retraction  - 2-3 x daily - 7 x weekly - 1 sets - 10 reps -  5 second hold  ASSESSMENT:  CLINICAL IMPRESSION: Patient has made moderate progress to therapy goals. Currently with long term goals partially MET. Some improvement in pain at rest. Significant improvement in cervical rotation. Patient is starting a new job which will make scheduling ongoing visits a challenge. She feels confident to continue to home exercise for continued postural improvement and strengthening. Reviewed HEP and issued resistance bands. Patient being DC today with goals partially MET. Encouraged patient to follow up with therapy services with nay further questions or concerns.   OBJECTIVE IMPAIRMENTS decreased activity tolerance, decreased ROM, decreased strength, increased fascial  restrictions, impaired flexibility, impaired UE functional use, and pain.   ACTIVITY LIMITATIONS carrying, lifting, and reach over head  PARTICIPATION LIMITATIONS: meal prep, cleaning, laundry, driving, shopping, community activity, occupation, and yard work  PERSONAL FACTORS Time since onset of injury/illness/exacerbation are also affecting patient's functional outcome.   REHAB POTENTIAL: Good  CLINICAL DECISION MAKING: Stable/uncomplicated  EVALUATION COMPLEXITY: Low   GOALS: SHORT TERM GOALS: Target date: 07/03/2022  Patient will be independent with initial HEP and self-management strategies to improve functional outcomes Baseline:  Goal status: MET    LONG TERM GOALS: Target date: 07/24/2022  Patient will be independent with advanced HEP and self-management strategies to improve functional outcomes Baseline: Reviewed and answered all questions Goal status: MET  2.  Patient will improve NDI score by at least 9 points to indicate improvement in functional outcomes Baseline: 22/50 (improved 6 points) Goal status: NOT MET  3.  Patient will demo improved LT cervical rotation by 10 degrees in order to improve ability to scan environment for safety and while driving. Baseline: 60 deg (improved 22 deg)  Goal status: MET  4. Patient will report a decrease in neck pain to no more than 3/10 at worst for improved quality of life and ability to perform UE ADLs  Baseline: 2/10 Goal status: MET  PLAN: PT FREQUENCY: 1-2x/week  PT DURATION: 6 weeks  PLANNED INTERVENTIONS: Therapeutic exercises, Therapeutic activity, Neuromuscular re-education, Balance training, Gait training, Patient/Family education, Joint manipulation, Joint mobilization, Stair training, Aquatic Therapy, Dry Needling, Electrical stimulation, Spinal manipulation, Spinal mobilization, Cryotherapy, Moist heat, scar mobilization, Taping, Traction, Ultrasound, Biofeedback, Ionotophoresis 75m/ml Dexamethasone, and Manual  therapy.  PLAN FOR NEXT SESSION: DC to HEP   9:32 AM, 08/07/22 CJosue HectorPT DPT  Physical [facility] ([phone]

## 2022-08-07 NOTE — Addendum Note (Signed)
Addended by: Georges Lynch A on: 08/07/2022 09:33 AM   Modules accepted: Orders

## 2022-11-02 ENCOUNTER — Ambulatory Visit: Payer: Medicaid Other | Admitting: Gastroenterology

## 2022-11-02 NOTE — Progress Notes (Deleted)
GI Office Note    Referring Provider: [facility] Primary Care Physician:  Rosine Door Primary Gastroenterologist: Elon Alas. Abbey Chatters, DO  Date:  11/02/2022  ID:  Ruth Shah, DOB Dec 30, 1975, MRN [id number]   Chief Complaint   No chief complaint on file.   History of Present Illness  Ruth Shah is a 47 y.o. female with a history of *** presenting today for procedure follow up.   Last office visit ***.  Colonoscopy 07/31/22: -***  Today:    Current Outpatient Medications  Medication Sig Dispense Refill   acetaminophen (TYLENOL) 500 MG tablet Take 1,000 mg by mouth every 6 (six) hours as needed for moderate pain or mild pain.     Cholecalciferol (VITAMIN D3) 1.25 MG (50000 UT) CAPS Take 50,000 Units by mouth once a week.     FLUoxetine (PROZAC) 40 MG capsule Take 40 mg by mouth daily.     ibuprofen (ADVIL) 200 MG tablet Take 400 mg by mouth every 6 (six) hours as needed for headache or moderate pain.     lisinopril-hydrochlorothiazide (ZESTORETIC) 20-25 MG tablet Take 1 tablet by mouth daily.     methylphenidate 27 MG PO CR tablet Take 27 mg by mouth daily.     pantoprazole (PROTONIX) 40 MG tablet Take 1 tablet (40 mg total) by mouth 2 (two) times daily before a meal. 180 tablet 1   propranolol ER (INDERAL LA) 80 MG 24 hr capsule Take 80 mg by mouth at bedtime.  (Patient not taking: Reported on 07/20/2022)     No current facility-administered medications for this visit.    Past Medical History:  Diagnosis Date   ADD (attention deficit disorder)    Arthritis    Asthma    mild seasonal reactions to mold and mildew   Back pain    Depression    Family history of adverse reaction to anesthesia    mother has severe PONV   Fatty liver    Fibromyalgia    GERD (gastroesophageal reflux disease)    Herpes simplex disease    History of kidney stones    Hypertension    Migraines    Paresthesia of both hands    PONV (postoperative nausea  and vomiting)    Urticaria     Past Surgical History:  Procedure Laterality Date   BIOPSY  09/23/2019   Procedure: BIOPSY;  Surgeon: Danie Binder, MD;  [facility];  Service: Endoscopy;;  esophagus   BIOPSY  07/31/2022   Procedure: BIOPSY;  Surgeon: Eloise Harman, DO;  [facility];  Service: Endoscopy;;   CHOLECYSTECTOMY N/A 04/08/2020   Procedure: LAPAROSCOPIC CHOLECYSTECTOMY;  Surgeon: Aviva Signs, MD;  Location: [facility];  Service: General;  Laterality: N/A;   collapsed lung     after rotator cuff surgery   COLONOSCOPY WITH PROPOFOL N/A 07/31/2022   Procedure: COLONOSCOPY WITH PROPOFOL;  Surgeon: Eloise Harman, DO;  [facility];  Service: Endoscopy;  Laterality: N/A;  10:30 am   ENDOMETRIAL ABLATION     ESOPHAGEAL MANOMETRY N/A 10/15/2017   Procedure: ESOPHAGEAL MANOMETRY (EM);  Surgeon: Mauri Pole, MD;  Location: [facility];  Service: Endoscopy;  Laterality: N/A;   ESOPHAGOGASTRODUODENOSCOPY (EGD) WITH PROPOFOL N/A 12/26/2016   Dr. Oneida Alar: one benign-appearing, intrinsic stenosis that was widely patent and traversed. Small hiatal hernia, few small sessile polyps. Chronic gastritis. Normal duodenum, negative celiac sprue.    ESOPHAGOGASTRODUODENOSCOPY (EGD) WITH PROPOFOL N/A 09/23/2019  Surgeon: Danie Binder, MD;  1 benign-appearing esophageal stenosis s/p dilation, esophagus was biopsied, normal examined stomach and duodenum.  Pathology with mild reactive changes, no increased intraepithelial eosinophils.   HERNIA REPAIR     inguinal as a baby    left ovarian removal due to cyst     PILONIDAL CYST / SINUS EXCISION  2009   RECTOCELE REPAIR N/A 10/02/2017   Procedure: POSTERIOR REPAIR (RECTOCELE);  Surgeon: Jonnie Kind, MD;  Location: [facility];  Service: Gynecology;  Laterality: N/A;   SAVORY DILATION N/A 09/23/2019   Procedure: SAVORY DILATION;  Surgeon: Danie Binder, MD;  [facility];  Service: Endoscopy;   Laterality: N/A;   SHOULDER ARTHROSCOPY WITH ROTATOR CUFF REPAIR Right 09/22/2021   Procedure: SHOULDER ARTHROSCOPY WITH ROTATOR CUFF REPAIR, BICEPS TENOTOMY, SUBACROMIAL DECOMPRESSION, DEBRIDEMENT;  Surgeon: Nicholes Stairs, MD;  Location: [place];  Service: Orthopedics;  Laterality: Right;  120   SINOSCOPY     TUBAL LIGATION     VAGINAL HYSTERECTOMY N/A 10/02/2017   Procedure: HYSTERECTOMY VAGINAL;  Surgeon: Jonnie Kind, MD;  Location: [facility];  Service: Gynecology;  Laterality: N/A;    Family History  Problem Relation Age of Onset   Cancer Mother        skin   Depression Mother    Hypertension Mother    Hyperlipidemia Mother    Diabetes Mother    Cancer Father        skin   Arthritis Father    Hypertension Father    Hyperlipidemia Father    Asthma Brother    Depression Maternal Grandmother    Hypertension Maternal Grandmother    Mental illness Maternal Grandmother    Cancer Maternal Grandfather        kidney   Hypertension Maternal Grandfather    Kidney disease Maternal Grandfather    Emphysema Paternal Grandmother    Alzheimer's disease Paternal Grandmother    Parkinson's disease Paternal Grandfather    Colon cancer Neg Hx    Colon polyps Neg Hx    Allergic rhinitis Neg Hx    Eczema Neg Hx    Urticaria Neg Hx     Allergies as of 11/02/2022 - Review Complete 08/03/2022  Allergen Reaction Noted   Dog epithelium allergy skin test Cough 06/26/2022   Morphine and related Itching 09/22/2021   Dilaudid [hydromorphone hcl] Nausea And Vomiting 10/10/2017   Duloxetine Other (See Comments) 06/13/2018   Sulfa antibiotics Nausea Only 07/20/2016   Toradol [ketorolac tromethamine] Nausea Only 07/20/2016   Vicodin [hydrocodone-acetaminophen] Itching 08/12/2015    Social History   Socioeconomic History   Marital status: Married    Spouse name: Not on file   Number of children: 4   Years of education: Some College   Highest education level: Not on file   Occupational History   Not on file  Tobacco Use   Smoking status: Never   Smokeless tobacco: Never  Vaping Use   Vaping Use: Never used  Substance and Sexual Activity   Alcohol use: No    Alcohol/week: 0.0 standard drinks of alcohol   Drug use: No   Sexual activity: Yes    Birth control/protection: Surgical    Comment: hyst  Other Topics Concern   Not on file  Social History Narrative   Lives at home with husband and children.   Right-handed.   4 cups caffeine per week.   Social Determinants of Health   Financial Resource Strain: Not on file  Food Insecurity: Not on file  Transportation Needs: Not on file  Physical Activity: Not on file  Stress: Not on file  Social Connections: Not on file     Review of Systems   Gen: Denies fever, chills, anorexia. Denies fatigue, weakness, weight loss.  CV: Denies chest pain, palpitations, syncope, peripheral edema, and claudication. Resp: Denies dyspnea at rest, cough, wheezing, coughing up blood, and pleurisy. GI: See HPI Derm: Denies rash, itching, dry skin Psych: Denies depression, anxiety, memory loss, confusion. No homicidal or suicidal ideation.  Heme: Denies bruising, bleeding, and enlarged lymph nodes.   Physical Exam   There were no vitals taken for this visit.  General:   Alert and oriented. No distress noted. Pleasant and cooperative.  Head:  Normocephalic and atraumatic. Eyes:  Conjuctiva clear without scleral icterus. Mouth:  Oral mucosa pink and moist. Good dentition. No lesions. Lungs:  Clear to auscultation bilaterally. No wheezes, rales, or rhonchi. No distress.  Heart:  S1, S2 present without murmurs appreciated.  Abdomen:  +BS, soft, non-tender and non-distended. No rebound or guarding. No HSM or masses noted. Rectal: *** Msk:  Symmetrical without gross deformities. Normal posture. Extremities:  Without edema. Neurologic:  Alert and  oriented x4 Psych:  Alert and cooperative. Normal mood and  affect.   Assessment  Laterra TATIA PETRUCCI is a 47 y.o. female with a history of *** presenting today with    PLAN   ***     Venetia Night, MSN, FNP-BC, AGACNP-BC [facility]

## 2022-11-03 ENCOUNTER — Encounter: Payer: Self-pay | Admitting: Gastroenterology

## 2023-03-21 ENCOUNTER — Ambulatory Visit (HOSPITAL_COMMUNITY): Payer: Self-pay | Admitting: Orthopedic Surgery

## 2023-05-11 NOTE — Pre-Procedure Instructions (Signed)
Surgical Instructions   Your procedure is scheduled on Thursday, August 29th. Report to [facility] Main Entrance "A" at 05:30 A.M., then check in with the Admitting office. Any questions or running late day of surgery: call [phone]  Questions prior to your surgery date: call [phone], Monday-Friday, 8am-4pm. If you experience any cold or flu symptoms such as cough, fever, chills, shortness of breath, etc. between now and your scheduled surgery, please notify us at the above number.     Remember:  Do not eat after midnight the night before your surgery  You may drink clear liquids until 04:30 AM the morning of your surgery.   Clear liquids allowed are: Water, Non-Citrus Juices (without pulp), Carbonated Beverages, Clear Tea, Black Coffee Only (NO MILK, CREAM OR POWDERED CREAMER of any kind), and Gatorade.    Take these medicines the morning of surgery with A SIP OF WATER  FLUoxetine (PROZAC)  pantoprazole (PROTONIX)    May take these medicines IF NEEDED: acetaminophen (TYLENOL)  SUMAtriptan (IMITREX)   One week prior to surgery, STOP taking any Aspirin (unless otherwise instructed by your surgeon) Aleve, Naproxen, Ibuprofen, Motrin, Advil, Goody's, BC's, all herbal medications, fish oil, and non-prescription vitamins.                     Do NOT Smoke (Tobacco/Vaping) for 24 hours prior to your procedure.  If you use a CPAP at night, you may bring your mask/headgear for your overnight stay.   You will be asked to remove any contacts, glasses, piercing's, hearing aid's, dentures/partials prior to surgery. Please bring cases for these items if needed.    Patients discharged the day of surgery will not be allowed to drive home, and someone needs to stay with them for 24 hours.  SURGICAL WAITING ROOM VISITATION Patients may have no more than 2 support people in the waiting area - these visitors may rotate.   Pre-op nurse will coordinate an appropriate time for 1 ADULT  support person, who may not rotate, to accompany patient in pre-op.  Children under the age of 38 must have an adult with them who is not the patient and must remain in the main waiting area with an adult.  If the patient needs to stay at the hospital during part of their recovery, the visitor guidelines for inpatient rooms apply.  Please refer to the [facility] website for the visitor guidelines for any additional information.   If you received a COVID test during your pre-op visit  it is requested that you wear a mask when out in public, stay away from anyone that may not be feeling well and notify your surgeon if you develop symptoms. If you have been in contact with anyone that has tested positive in the last 10 days please notify you surgeon.      Pre-operative 5 CHG Bathing Instructions   You can play a key role in reducing the risk of infection after surgery. Your skin needs to be as free of germs as possible. You can reduce the number of germs on your skin by washing with CHG (chlorhexidine gluconate) soap before surgery. CHG is an antiseptic soap that kills germs and continues to kill germs even after washing.   DO NOT use if you have an allergy to chlorhexidine/CHG or antibacterial soaps. If your skin becomes reddened or irritated, stop using the CHG and notify one of our RNs at [phone].   Please shower with the CHG soap starting 4  days before surgery using the following schedule:     Please keep in mind the following:  DO NOT shave, including legs and underarms, starting the day of your first shower.   You may shave your face at any point before/day of surgery.  Place clean sheets on your bed the day you start using CHG soap. Use a clean washcloth (not used since being washed) for each shower. DO NOT sleep with pets once you start using the CHG.   CHG Shower Instructions:  If you choose to wash your hair and private area, wash first with your normal shampoo/soap.   After you use shampoo/soap, rinse your hair and body thoroughly to remove shampoo/soap residue.  Turn the water OFF and apply about 3 tablespoons (45 ml) of CHG soap to a CLEAN washcloth.  Apply CHG soap ONLY FROM YOUR NECK DOWN TO YOUR TOES (washing for 3-5 minutes)  DO NOT use CHG soap on face, private areas, open wounds, or sores.  Pay special attention to the area where your surgery is being performed.  If you are having back surgery, having someone wash your back for you may be helpful. Wait 2 minutes after CHG soap is applied, then you may rinse off the CHG soap.  Pat dry with a clean towel  Put on clean clothes/pajamas   If you choose to wear lotion, please use ONLY the CHG-compatible lotions on the back of this paper.   Additional instructions for the day of surgery: DO NOT APPLY any lotions, deodorants, cologne, or perfumes.   Do not bring valuables to the hospital. [facility] is not responsible for any belongings/valuables. Do not wear nail polish, gel polish, artificial nails, or any other type of covering on natural nails (fingers and toes) Do not wear jewelry or makeup Put on clean/comfortable clothes.  Please brush your teeth.  Ask your nurse before applying any prescription medications to the skin.     CHG Compatible Lotions   Aveeno Moisturizing lotion  Cetaphil Moisturizing Cream  Cetaphil Moisturizing Lotion  Clairol Herbal Essence Moisturizing Lotion, Dry Skin  Clairol Herbal Essence Moisturizing Lotion, Extra Dry Skin  Clairol Herbal Essence Moisturizing Lotion, Normal Skin  Curel Age Defying Therapeutic Moisturizing Lotion with Alpha Hydroxy  Curel Extreme Care Body Lotion  Curel Soothing Hands Moisturizing Hand Lotion  Curel Therapeutic Moisturizing Cream, Fragrance-Free  Curel Therapeutic Moisturizing Lotion, Fragrance-Free  Curel Therapeutic Moisturizing Lotion, Original Formula  Eucerin Daily Replenishing Lotion  Eucerin Dry Skin Therapy Plus Alpha  Hydroxy Crme  Eucerin Dry Skin Therapy Plus Alpha Hydroxy Lotion  Eucerin Original Crme  Eucerin Original Lotion  Eucerin Plus Crme Eucerin Plus Lotion  Eucerin TriLipid Replenishing Lotion  Keri Anti-Bacterial Hand Lotion  Keri Deep Conditioning Original Lotion Dry Skin Formula Softly Scented  Keri Deep Conditioning Original Lotion, Fragrance Free Sensitive Skin Formula  Keri Lotion Fast Absorbing Fragrance Free Sensitive Skin Formula  Keri Lotion Fast Absorbing Softly Scented Dry Skin Formula  Keri Original Lotion  Keri Skin Renewal Lotion Keri Silky Smooth Lotion  Keri Silky Smooth Sensitive Skin Lotion  Nivea Body Creamy Conditioning Oil  Nivea Body Extra Enriched Lotion  Nivea Body Original Lotion  Nivea Body Sheer Moisturizing Lotion Nivea Crme  Nivea Skin Firming Lotion  NutraDerm 30 Skin Lotion  NutraDerm Skin Lotion  NutraDerm Therapeutic Skin Cream  NutraDerm Therapeutic Skin Lotion  ProShield Protective Hand Cream  Provon moisturizing lotion  Please read over the following fact sheets that you were given.

## 2023-05-14 ENCOUNTER — Encounter (HOSPITAL_COMMUNITY): Payer: Self-pay

## 2023-05-14 ENCOUNTER — Other Ambulatory Visit: Payer: Self-pay

## 2023-05-14 ENCOUNTER — Encounter (HOSPITAL_COMMUNITY)
Admission: RE | Admit: 2023-05-14 | Discharge: 2023-05-14 | Disposition: A | Discharge status: Home / Self Care | Payer: Medicaid Other | Source: Ambulatory Visit | Attending: Orthopedic Surgery | Admitting: Orthopedic Surgery

## 2023-05-14 VITALS — BP 160/94 | HR 75 | Temp 98.6°F | Resp 16 | Ht 63.0 in | Wt 187.1 lb

## 2023-05-14 DIAGNOSIS — Z01812 Encounter for preprocedural laboratory examination: Secondary | ICD-10-CM | POA: Insufficient documentation

## 2023-05-14 DIAGNOSIS — Z01818 Encounter for other preprocedural examination: Secondary | ICD-10-CM

## 2023-05-14 DIAGNOSIS — K760 Fatty (change of) liver, not elsewhere classified: Secondary | ICD-10-CM

## 2023-05-14 HISTORY — DX: Personal history of other diseases of the digestive system: Z87.19

## 2023-05-14 HISTORY — DX: Anxiety disorder, unspecified: F41.9

## 2023-05-14 HISTORY — DX: Ulcer of intestine: K63.3

## 2023-05-14 LAB — SURGICAL PCR SCREEN
MRSA, PCR: NEGATIVE
Staphylococcus aureus: POSITIVE — AB

## 2023-05-14 LAB — COMPREHENSIVE METABOLIC PANEL
ALT: 18 U/L (ref 0–44)
AST: 18 U/L (ref 15–41)
Albumin: 3.8 g/dL (ref 3.5–5.0)
Alkaline Phosphatase: 80 U/L (ref 38–126)
Anion gap: 10 (ref 5–15)
BUN: 13 mg/dL (ref 6–20)
CO2: 26 mmol/L (ref 22–32)
Calcium: 9.2 mg/dL (ref 8.9–10.3)
Chloride: 101 mmol/L (ref 98–111)
Creatinine, Ser: 0.95 mg/dL (ref 0.44–1.00)
GFR, Estimated: >60 mL/min (ref >60)
Glucose, Bld: 99 mg/dL (ref 70–99)
Potassium: 4 mmol/L (ref 3.5–5.1)
Sodium: 137 mmol/L (ref 135–145)
Total Bilirubin: 0.9 mg/dL (ref 0.3–1.2)
Total Protein: 7 g/dL (ref 6.5–8.1)

## 2023-05-14 LAB — CBC
HCT: 43.9 % (ref 36.0–46.0)
Hemoglobin: 14.3 g/dL (ref 12.0–15.0)
MCH: 29.5 pg (ref 26.0–34.0)
MCHC: 32.6 g/dL (ref 30.0–36.0)
MCV: 90.5 fL (ref 80.0–100.0)
Platelets: 323 10*3/uL (ref 150–400)
RBC: 4.85 MIL/uL (ref 3.87–5.11)
RDW: 12.8 % (ref 11.5–15.5)
WBC: 9.8 10*3/uL (ref 4.0–10.5)
nRBC: 0 % (ref 0.0–0.2)

## 2023-05-14 NOTE — Progress Notes (Signed)
PCP - Wayland Denis, PA-C Cardiologist - denies  PPM/ICD - denies   Chest x-ray - denies EKG - 08/03/22 Stress Test - denies ECHO - denies Cardiac Cath - denies  Sleep Study - denies   DM- denies  ASA/Blood Thinner Instructions: n/a   ERAS Protcol - yes, no drink   COVID TEST- n/a   Anesthesia review: yes, high BP at PAT (initially 158/110 at beginning of appt; then 160/94 at end of appt). Pt advised to keep a log of 1-2 BP readings per day. I told her if diastolic continues to stay close to 100 or over 100 to call her PCP and let them know. She said she had been without her BP med for about 3 days. She just got it filled and took it this morning   Patient denies shortness of breath, fever, cough and chest pain at PAT appointment   All instructions explained to the patient, with a verbal understanding of the material. Patient agrees to go over the instructions while at home for a better understanding.  The opportunity to ask questions was provided.

## 2023-05-14 NOTE — Progress Notes (Signed)
PCP - Wayland Denis, PA-C Cardiologist - denies  PPM/ICD - denies   Chest x-ray - denies EKG - 08/03/22 Stress Test - denies ECHO - denies Cardiac Cath - denies  Sleep Study - denies   DM- denies  ASA/Blood Thinner Instructions: n/a   ERAS Protcol - yes, no drink   COVID TEST- n/a   Anesthesia review: yes, high BP at PAT   Patient denies shortness of breath, fever, cough and chest pain at PAT appointment   All instructions explained to the patient, with a verbal understanding of the material. Patient agrees to go over the instructions while at home for a better understanding.  The opportunity to ask questions was provided.

## 2023-05-15 ENCOUNTER — Encounter (HOSPITAL_COMMUNITY): Payer: Self-pay | Admitting: Physician Assistant

## 2023-05-24 ENCOUNTER — Ambulatory Visit (HOSPITAL_COMMUNITY): Admission: RE | Admit: 2023-05-24 | Payer: Medicaid Other | Source: Ambulatory Visit | Admitting: Orthopedic Surgery

## 2023-05-24 SURGERY — INSERTION, SPINAL CORD STIMULATOR, LUMBAR
Anesthesia: General

## 2023-08-15 ENCOUNTER — Ambulatory Visit (HOSPITAL_COMMUNITY): Payer: Self-pay | Admitting: Orthopedic Surgery

## 2023-09-07 NOTE — Pre-Procedure Instructions (Signed)
Surgical Instructions   Your procedure is scheduled on Thursday, December 26th. Report to [facility] Main Entrance "A" at 05:30 A.M., then check in with the Admitting office. Any questions or running late day of surgery: call [phone]  Questions prior to your surgery date: call [phone], Monday-Friday, 8am-4pm. If you experience any cold or flu symptoms such as cough, fever, chills, shortness of breath, etc. between now and your scheduled surgery, please notify us at the above number.     Remember:  Do not eat after midnight the night before your surgery  You may drink clear liquids until 04:30 AM the morning of your surgery.   Clear liquids allowed are: Water, Non-Citrus Juices (without pulp), Carbonated Beverages, Clear Tea (no milk, honey, etc.), Black Coffee Only (NO MILK, CREAM OR POWDERED CREAMER of any kind), and Gatorade.    Take these medicines the morning of surgery with A SIP OF WATER  cetirizine (ZYRTEC)  pantoprazole (PROTONIX)    May take these medicines IF NEEDED: acetaminophen (TYLENOL)  albuterol (VENTOLIN HFA)- bring inhaler with you on day of surgery SUMAtriptan (IMITREX)    One week prior to surgery, STOP taking any Aspirin (unless otherwise instructed by your surgeon) Aleve, Naproxen, Ibuprofen, Motrin, Advil, Goody's, BC's, all herbal medications, fish oil, and non-prescription vitamins.                     Do NOT Smoke (Tobacco/Vaping) for 24 hours prior to your procedure.  If you use a CPAP at night, you may bring your mask/headgear for your overnight stay.   You will be asked to remove any contacts, glasses, piercing's, hearing aid's, dentures/partials prior to surgery. Please bring cases for these items if needed.    Patients discharged the day of surgery will not be allowed to drive home, and someone needs to stay with them for 24 hours.  SURGICAL WAITING ROOM VISITATION Patients may have no more than 2 support people in the waiting area -  these visitors may rotate.   Pre-op nurse will coordinate an appropriate time for 1 ADULT support person, who may not rotate, to accompany patient in pre-op.  Children under the age of 7 must have an adult with them who is not the patient and must remain in the main waiting area with an adult.  If the patient needs to stay at the hospital during part of their recovery, the visitor guidelines for inpatient rooms apply.  Please refer to the [facility] website for the visitor guidelines for any additional information.   If you received a COVID test during your pre-op visit  it is requested that you wear a mask when out in public, stay away from anyone that may not be feeling well and notify your surgeon if you develop symptoms. If you have been in contact with anyone that has tested positive in the last 10 days please notify you surgeon.      Pre-operative 5 CHG Bathing Instructions   You can play a key role in reducing the risk of infection after surgery. Your skin needs to be as free of germs as possible. You can reduce the number of germs on your skin by washing with CHG (chlorhexidine gluconate) soap before surgery. CHG is an antiseptic soap that kills germs and continues to kill germs even after washing.   DO NOT use if you have an allergy to chlorhexidine/CHG or antibacterial soaps. If your skin becomes reddened or irritated, stop using the CHG and notify  one of our RNs at [phone].   Please shower with the CHG soap starting 4 days before surgery using the following schedule:     Please keep in mind the following:  DO NOT shave, including legs and underarms, starting the day of your first shower.   You may shave your face at any point before/day of surgery.  Place clean sheets on your bed the day you start using CHG soap. Use a clean washcloth (not used since being washed) for each shower. DO NOT sleep with pets once you start using the CHG.   CHG Shower Instructions:  Wash  your face and private area with normal soap. If you choose to wash your hair, wash first with your normal shampoo.  After you use shampoo/soap, rinse your hair and body thoroughly to remove shampoo/soap residue.  Turn the water OFF and apply about 3 tablespoons (45 ml) of CHG soap to a CLEAN washcloth.  Apply CHG soap ONLY FROM YOUR NECK DOWN TO YOUR TOES (washing for 3-5 minutes)  DO NOT use CHG soap on face, private areas, open wounds, or sores.  Pay special attention to the area where your surgery is being performed.  If you are having back surgery, having someone wash your back for you may be helpful. Wait 2 minutes after CHG soap is applied, then you may rinse off the CHG soap.  Pat dry with a clean towel  Put on clean clothes/pajamas   If you choose to wear lotion, please use ONLY the CHG-compatible lotions on the back of this paper.   Additional instructions for the day of surgery: DO NOT APPLY any lotions, deodorants, cologne, or perfumes.   Do not bring valuables to the hospital. [facility] is not responsible for any belongings/valuables. Do not wear nail polish, gel polish, artificial nails, or any other type of covering on natural nails (fingers and toes) Do not wear jewelry or makeup Put on clean/comfortable clothes.  Please brush your teeth.  Ask your nurse before applying any prescription medications to the skin.     CHG Compatible Lotions   Aveeno Moisturizing lotion  Cetaphil Moisturizing Cream  Cetaphil Moisturizing Lotion  Clairol Herbal Essence Moisturizing Lotion, Dry Skin  Clairol Herbal Essence Moisturizing Lotion, Extra Dry Skin  Clairol Herbal Essence Moisturizing Lotion, Normal Skin  Curel Age Defying Therapeutic Moisturizing Lotion with Alpha Hydroxy  Curel Extreme Care Body Lotion  Curel Soothing Hands Moisturizing Hand Lotion  Curel Therapeutic Moisturizing Cream, Fragrance-Free  Curel Therapeutic Moisturizing Lotion, Fragrance-Free  Curel Therapeutic  Moisturizing Lotion, Original Formula  Eucerin Daily Replenishing Lotion  Eucerin Dry Skin Therapy Plus Alpha Hydroxy Crme  Eucerin Dry Skin Therapy Plus Alpha Hydroxy Lotion  Eucerin Original Crme  Eucerin Original Lotion  Eucerin Plus Crme Eucerin Plus Lotion  Eucerin TriLipid Replenishing Lotion  Keri Anti-Bacterial Hand Lotion  Keri Deep Conditioning Original Lotion Dry Skin Formula Softly Scented  Keri Deep Conditioning Original Lotion, Fragrance Free Sensitive Skin Formula  Keri Lotion Fast Absorbing Fragrance Free Sensitive Skin Formula  Keri Lotion Fast Absorbing Softly Scented Dry Skin Formula  Keri Original Lotion  Keri Skin Renewal Lotion Keri Silky Smooth Lotion  Keri Silky Smooth Sensitive Skin Lotion  Nivea Body Creamy Conditioning Oil  Nivea Body Extra Enriched Teacher, adult education Moisturizing Lotion Nivea Crme  Nivea Skin Firming Lotion  NutraDerm 30 Skin Lotion  NutraDerm Skin Lotion  NutraDerm Therapeutic Skin Cream  NutraDerm Therapeutic Skin Lotion  ProShield Protective Hand Cream  Provon moisturizing lotion  Please read over the following fact sheets that you were given.

## 2023-09-10 ENCOUNTER — Encounter (HOSPITAL_COMMUNITY): Payer: Self-pay

## 2023-09-10 ENCOUNTER — Encounter (HOSPITAL_COMMUNITY)
Admission: RE | Admit: 2023-09-10 | Discharge: 2023-09-10 | Disposition: A | Discharge status: Home / Self Care | Payer: Medicaid Other | Source: Ambulatory Visit | Attending: Orthopedic Surgery | Admitting: Orthopedic Surgery

## 2023-09-10 ENCOUNTER — Other Ambulatory Visit: Payer: Self-pay

## 2023-09-10 VITALS — BP 193/115 | HR 81 | Temp 98.3°F | Resp 17 | Ht 63.0 in | Wt 196.6 lb

## 2023-09-10 DIAGNOSIS — Z01818 Encounter for other preprocedural examination: Secondary | ICD-10-CM | POA: Diagnosis present

## 2023-09-10 LAB — CBC
HCT: 41.5 % (ref 36.0–46.0)
Hemoglobin: 13.6 g/dL (ref 12.0–15.0)
MCH: 29.9 pg (ref 26.0–34.0)
MCHC: 32.8 g/dL (ref 30.0–36.0)
MCV: 91.2 fL (ref 80.0–100.0)
Platelets: 315 10*3/uL (ref 150–400)
RBC: 4.55 MIL/uL (ref 3.87–5.11)
RDW: 12.3 % (ref 11.5–15.5)
WBC: 10.2 10*3/uL (ref 4.0–10.5)
nRBC: 0 % (ref 0.0–0.2)

## 2023-09-10 LAB — BASIC METABOLIC PANEL
Anion gap: 5 (ref 5–15)
BUN: 14 mg/dL (ref 6–20)
CO2: 30 mmol/L (ref 22–32)
Calcium: 9.3 mg/dL (ref 8.9–10.3)
Chloride: 105 mmol/L (ref 98–111)
Creatinine, Ser: 1.03 mg/dL — ABNORMAL HIGH (ref 0.44–1.00)
GFR, Estimated: >60 mL/min (ref >60)
Glucose, Bld: 86 mg/dL (ref 70–99)
Potassium: 3.9 mmol/L (ref 3.5–5.1)
Sodium: 140 mmol/L (ref 135–145)

## 2023-09-10 LAB — SURGICAL PCR SCREEN
MRSA, PCR: NEGATIVE
Staphylococcus aureus: POSITIVE — AB

## 2023-09-10 NOTE — Progress Notes (Signed)
PCP - Dr. Wayland Denis Cardiologist - Denies  PPM/ICD - Denies Device Orders - N/A Rep Notified - N/A  Chest x-ray - N/A EKG - 09/10/23 Stress Test - N/A  ECHO - N/A Cardiac Cath - N/A  Sleep Study - Denies CPAP - N/A  Fasting Blood Sugar - Denies Checks Blood Sugar _____ times a day N/A  Last dose of GLP1 agonist-  Denies GLP1 instructions: N/A  Blood Thinner Instructions: Denies taking any Aspirin Instructions: Denies taking any; instructed on how to not take any OTC aspirin  ERAS Protcol - Y PRE-SURGERY Ensure or G2-   COVID TEST- N   Anesthesia review: N  Patient denies shortness of breath, fever, cough and chest pain at PAT appointment. Patient denies any respiratory issues at this time.    All instructions explained to the patient, with a verbal understanding of the material. Patient agrees to go over the instructions while at home for a better understanding. Patient also instructed to self quarantine after being tested for COVID-19. The opportunity to ask questions was provided.

## 2023-09-19 NOTE — Anesthesia Preprocedure Evaluation (Signed)
Anesthesia Evaluation  Patient identified by MRN, date of birth, ID band Patient awake    Reviewed: Allergy & Precautions, H&P , NPO status , Patient's Chart, lab work & pertinent test results, reviewed documented beta blocker date and time   History of Anesthesia Complications (+) PONV, Family history of anesthesia reaction and history of anesthetic complications  Airway Mallampati: II  TM Distance: >3 FB Neck ROM: Full    Dental  (+) Dental Advisory Given, Teeth Intact   Pulmonary asthma    Pulmonary exam normal breath sounds clear to auscultation       Cardiovascular Exercise Tolerance: Good hypertension, Pt. on medications and Pt. on home beta blockers Normal cardiovascular exam Rhythm:Regular Rate:Normal     Neuro/Psych  Headaches PSYCHIATRIC DISORDERS Anxiety Depression     Neuromuscular disease    GI/Hepatic Neg liver ROS, hiatal hernia,GERD  Medicated and Controlled,,  Endo/Other  negative endocrine ROS    Renal/GU negative Renal ROS  negative genitourinary   Musculoskeletal  (+) Arthritis , Osteoarthritis,  Fibromyalgia -  Abdominal   Peds  (+) ATTENTION DEFICIT DISORDER WITHOUT HYPERACTIVITY Hematology negative hematology ROS (+)   Anesthesia Other Findings   Reproductive/Obstetrics negative OB ROS                              Anesthesia Physical Anesthesia Plan  ASA: 3  Anesthesia Plan: General   Post-op Pain Management: Minimal or no pain anticipated   Induction: Intravenous  PONV Risk Score and Plan: 1 and TIVA, Ondansetron and Dexamethasone  Airway Management Planned: Oral ETT and LMA  Additional Equipment: None  Intra-op Plan:   Post-operative Plan: Extubation in OR  Informed Consent: I have reviewed the patients History and Physical, chart, labs and discussed the procedure including the risks, benefits and alternatives for the proposed anesthesia with the  patient or authorized representative who has indicated his/her understanding and acceptance.     Dental advisory given  Plan Discussed with: CRNA and Anesthesiologist  Anesthesia Plan Comments:          Anesthesia Quick Evaluation

## 2023-09-20 ENCOUNTER — Ambulatory Visit (HOSPITAL_BASED_OUTPATIENT_CLINIC_OR_DEPARTMENT_OTHER): Payer: Medicaid Other | Admitting: Anesthesiology

## 2023-09-20 ENCOUNTER — Other Ambulatory Visit: Payer: Self-pay

## 2023-09-20 ENCOUNTER — Encounter (HOSPITAL_COMMUNITY): Payer: Self-pay | Admitting: Orthopedic Surgery

## 2023-09-20 ENCOUNTER — Ambulatory Visit (HOSPITAL_COMMUNITY): Payer: Medicaid Other

## 2023-09-20 ENCOUNTER — Encounter (HOSPITAL_COMMUNITY)
Admission: RE | Disposition: A | Discharge status: Home / Self Care | Payer: Self-pay | Source: Home / Self Care | Attending: Orthopedic Surgery

## 2023-09-20 ENCOUNTER — Ambulatory Visit (HOSPITAL_COMMUNITY)
Admission: RE | Admit: 2023-09-20 | Discharge: 2023-09-21 | Disposition: A | Discharge status: Home / Self Care | Payer: Medicaid Other | Attending: Orthopedic Surgery | Admitting: Orthopedic Surgery

## 2023-09-20 ENCOUNTER — Ambulatory Visit (HOSPITAL_COMMUNITY): Payer: Self-pay | Admitting: Anesthesiology

## 2023-09-20 DIAGNOSIS — G8929 Other chronic pain: Secondary | ICD-10-CM | POA: Diagnosis present

## 2023-09-20 DIAGNOSIS — M961 Postlaminectomy syndrome, not elsewhere classified: Secondary | ICD-10-CM | POA: Insufficient documentation

## 2023-09-20 DIAGNOSIS — G894 Chronic pain syndrome: Secondary | ICD-10-CM

## 2023-09-20 DIAGNOSIS — J45909 Unspecified asthma, uncomplicated: Secondary | ICD-10-CM | POA: Diagnosis not present

## 2023-09-20 DIAGNOSIS — K219 Gastro-esophageal reflux disease without esophagitis: Secondary | ICD-10-CM | POA: Insufficient documentation

## 2023-09-20 DIAGNOSIS — G43909 Migraine, unspecified, not intractable, without status migrainosus: Secondary | ICD-10-CM | POA: Insufficient documentation

## 2023-09-20 DIAGNOSIS — Z79899 Other long term (current) drug therapy: Secondary | ICD-10-CM | POA: Insufficient documentation

## 2023-09-20 DIAGNOSIS — I10 Essential (primary) hypertension: Secondary | ICD-10-CM | POA: Insufficient documentation

## 2023-09-20 DIAGNOSIS — K449 Diaphragmatic hernia without obstruction or gangrene: Secondary | ICD-10-CM | POA: Diagnosis not present

## 2023-09-20 HISTORY — PX: SPINAL CORD STIMULATOR INSERTION: SHX5378

## 2023-09-20 SURGERY — INSERTION, SPINAL CORD STIMULATOR, LUMBAR
Anesthesia: General

## 2023-09-20 MED ORDER — OXYCODONE HCL 5 MG PO TABS: 5.0000 mg | ORAL_TABLET | ORAL | Status: DC | PRN

## 2023-09-20 MED ORDER — ONDANSETRON HCL 4 MG/2ML IJ SOLN: 4.0000 mg | Freq: Once | INTRAMUSCULAR | Status: DC | PRN

## 2023-09-20 MED ORDER — THROMBIN 20000 UNITS EX SOLR
CUTANEOUS | Status: DC | PRN
  Administered 2023-09-20: 20000 [IU] via TOPICAL

## 2023-09-20 MED ORDER — ACETAMINOPHEN 325 MG PO TABS: 325.0000 mg | ORAL_TABLET | ORAL | Status: DC | PRN

## 2023-09-20 MED ORDER — SODIUM CHLORIDE 0.9 % IV SOLN: 250.0000 mL | INTRAVENOUS | Status: DC

## 2023-09-20 MED ORDER — MIDAZOLAM HCL 2 MG/2ML IJ SOLN
INTRAMUSCULAR | Status: DC | PRN
  Administered 2023-09-20: 2 mg via INTRAVENOUS

## 2023-09-20 MED ORDER — SUGAMMADEX SODIUM 200 MG/2ML IV SOLN
INTRAVENOUS | Status: DC | PRN
  Administered 2023-09-20: 200 mg via INTRAVENOUS

## 2023-09-20 MED ORDER — SCOPOLAMINE 1 MG/3DAYS TD PT72: 1.0000 | MEDICATED_PATCH | TRANSDERMAL | Status: DC

## 2023-09-20 MED ORDER — OXYCODONE HCL 5 MG PO TABS
10.0000 mg | ORAL_TABLET | ORAL | Status: DC | PRN
  Administered 2023-09-20 – 2023-09-21 (×4): 10 mg via ORAL
  Filled 2023-09-20 (×6): qty 2

## 2023-09-20 MED ORDER — CEFAZOLIN SODIUM-DEXTROSE 2-4 GM/100ML-% IV SOLN
2.0000 g | INTRAVENOUS | Status: AC
  Administered 2023-09-20: 2 g via INTRAVENOUS
  Filled 2023-09-20: qty 100

## 2023-09-20 MED ORDER — ONDANSETRON HCL 4 MG PO TABS: 4.0000 mg | ORAL_TABLET | Freq: Three times a day (TID) | ORAL | 0 refills | Status: AC | PRN

## 2023-09-20 MED ORDER — ORAL CARE MOUTH RINSE: 15.0000 mL | OROMUCOSAL | Status: DC | PRN

## 2023-09-20 MED ORDER — DEXAMETHASONE SODIUM PHOSPHATE 10 MG/ML IJ SOLN
INTRAMUSCULAR | Status: DC | PRN
  Administered 2023-09-20: 10 mg via INTRAVENOUS

## 2023-09-20 MED ORDER — ACETAMINOPHEN 10 MG/ML IV SOLN
INTRAVENOUS | Status: AC
  Filled 2023-09-20: qty 100

## 2023-09-20 MED ORDER — ACETAMINOPHEN 10 MG/ML IV SOLN
1000.0000 mg | Freq: Once | INTRAVENOUS | Status: AC
  Administered 2023-09-20: 1000 mg via INTRAVENOUS

## 2023-09-20 MED ORDER — LACTATED RINGERS IV SOLN: INTRAVENOUS | Status: DC

## 2023-09-20 MED ORDER — FENTANYL CITRATE (PF) 100 MCG/2ML IJ SOLN
INTRAMUSCULAR | Status: AC
  Filled 2023-09-20: qty 2

## 2023-09-20 MED ORDER — 0.9 % SODIUM CHLORIDE (POUR BTL) OPTIME
TOPICAL | Status: DC | PRN
  Administered 2023-09-20: 1000 mL

## 2023-09-20 MED ORDER — SODIUM CHLORIDE 0.9% FLUSH: 3.0000 mL | INTRAVENOUS | Status: DC | PRN

## 2023-09-20 MED ORDER — ROCURONIUM BROMIDE 10 MG/ML (PF) SYRINGE
PREFILLED_SYRINGE | INTRAVENOUS | Status: DC | PRN
  Administered 2023-09-20: 20 mg via INTRAVENOUS
  Administered 2023-09-20: 50 mg via INTRAVENOUS

## 2023-09-20 MED ORDER — CHLORHEXIDINE GLUCONATE 0.12 % MT SOLN
15.0000 mL | Freq: Once | OROMUCOSAL | Status: AC
  Administered 2023-09-20: 15 mL via OROMUCOSAL
  Filled 2023-09-20: qty 15

## 2023-09-20 MED ORDER — ACETAMINOPHEN 650 MG RE SUPP: 650.0000 mg | RECTAL | Status: DC | PRN

## 2023-09-20 MED ORDER — MEPERIDINE HCL 25 MG/ML IJ SOLN: 6.2500 mg | INTRAMUSCULAR | Status: DC | PRN

## 2023-09-20 MED ORDER — LACTATED RINGERS IV SOLN
INTRAVENOUS | Status: DC
Start: 2023-09-20 — End: 2023-09-21

## 2023-09-20 MED ORDER — ACETAMINOPHEN 10 MG/ML IV SOLN: INTRAVENOUS | Status: DC | PRN

## 2023-09-20 MED ORDER — SODIUM CHLORIDE 0.9% FLUSH
3.0000 mL | Freq: Two times a day (BID) | INTRAVENOUS | Status: DC
  Administered 2023-09-20 (×2): 3 mL via INTRAVENOUS

## 2023-09-20 MED ORDER — THROMBIN 20000 UNITS EX SOLR
CUTANEOUS | Status: AC
  Filled 2023-09-20: qty 20000

## 2023-09-20 MED ORDER — MUPIROCIN 2 % EX OINT: 1.0000 | TOPICAL_OINTMENT | Freq: Two times a day (BID) | CUTANEOUS | 0 refills | Status: AC

## 2023-09-20 MED ORDER — METHOCARBAMOL 1000 MG/10ML IJ SOLN
500.0000 mg | Freq: Four times a day (QID) | INTRAMUSCULAR | Status: DC | PRN
  Administered 2023-09-20: 500 mg via INTRAVENOUS
  Filled 2023-09-20: qty 10

## 2023-09-20 MED ORDER — OXYCODONE-ACETAMINOPHEN 10-325 MG PO TABS: 1.0000 | ORAL_TABLET | Freq: Four times a day (QID) | ORAL | 0 refills | Status: AC | PRN

## 2023-09-20 MED ORDER — PROPOFOL 1000 MG/100ML IV EMUL
INTRAVENOUS | Status: AC
  Filled 2023-09-20: qty 100

## 2023-09-20 MED ORDER — METHOCARBAMOL 500 MG PO TABS
500.0000 mg | ORAL_TABLET | Freq: Four times a day (QID) | ORAL | Status: DC | PRN
  Administered 2023-09-21: 500 mg via ORAL
  Filled 2023-09-20: qty 1

## 2023-09-20 MED ORDER — MIDAZOLAM HCL 2 MG/2ML IJ SOLN
INTRAMUSCULAR | Status: AC
  Filled 2023-09-20: qty 2

## 2023-09-20 MED ORDER — OXYCODONE HCL 5 MG/5ML PO SOLN: 5.0000 mg | Freq: Once | ORAL | Status: AC | PRN

## 2023-09-20 MED ORDER — LIDOCAINE 2% (20 MG/ML) 5 ML SYRINGE
INTRAMUSCULAR | Status: DC | PRN
  Administered 2023-09-20: 100 mg via INTRAVENOUS

## 2023-09-20 MED ORDER — ALBUTEROL SULFATE (2.5 MG/3ML) 0.083% IN NEBU: 3.0000 mL | INHALATION_SOLUTION | Freq: Four times a day (QID) | RESPIRATORY_TRACT | Status: DC | PRN

## 2023-09-20 MED ORDER — PROPOFOL 10 MG/ML IV BOLUS
INTRAVENOUS | Status: AC
  Filled 2023-09-20: qty 20

## 2023-09-20 MED ORDER — PHENYLEPHRINE HCL-NACL 20-0.9 MG/250ML-% IV SOLN
INTRAVENOUS | Status: DC | PRN
  Administered 2023-09-20: 35 ug/min via INTRAVENOUS

## 2023-09-20 MED ORDER — POLYETHYLENE GLYCOL 3350 17 G PO PACK: 17.0000 g | PACK | Freq: Every day | ORAL | Status: DC | PRN

## 2023-09-20 MED ORDER — BUPIVACAINE-EPINEPHRINE 0.25% -1:200000 IJ SOLN
INTRAMUSCULAR | Status: DC | PRN
  Administered 2023-09-20: 20 mL

## 2023-09-20 MED ORDER — FENTANYL CITRATE (PF) 250 MCG/5ML IJ SOLN
INTRAMUSCULAR | Status: AC
  Filled 2023-09-20: qty 5

## 2023-09-20 MED ORDER — ACETAMINOPHEN 160 MG/5ML PO SOLN: 325.0000 mg | ORAL | Status: DC | PRN

## 2023-09-20 MED ORDER — ONDANSETRON HCL 4 MG/2ML IJ SOLN
4.0000 mg | Freq: Four times a day (QID) | INTRAMUSCULAR | Status: DC | PRN
  Administered 2023-09-20 – 2023-09-21 (×3): 4 mg via INTRAVENOUS
  Filled 2023-09-20 (×3): qty 2

## 2023-09-20 MED ORDER — PANTOPRAZOLE SODIUM 40 MG PO TBEC
40.0000 mg | DELAYED_RELEASE_TABLET | Freq: Two times a day (BID) | ORAL | Status: DC
  Administered 2023-09-20 – 2023-09-21 (×2): 40 mg via ORAL
  Filled 2023-09-20 (×2): qty 1

## 2023-09-20 MED ORDER — ONDANSETRON HCL 4 MG/2ML IJ SOLN
INTRAMUSCULAR | Status: DC | PRN
  Administered 2023-09-20: 4 mg via INTRAVENOUS

## 2023-09-20 MED ORDER — LISINOPRIL 20 MG PO TABS
20.0000 mg | ORAL_TABLET | Freq: Every day | ORAL | Status: DC
Start: 2023-09-21 — End: 2023-09-21
  Filled 2023-09-20: qty 1

## 2023-09-20 MED ORDER — CEFAZOLIN SODIUM-DEXTROSE 1-4 GM/50ML-% IV SOLN
1.0000 g | Freq: Three times a day (TID) | INTRAVENOUS | Status: AC
  Administered 2023-09-20 (×2): 1 g via INTRAVENOUS
  Filled 2023-09-20 (×2): qty 50

## 2023-09-20 MED ORDER — HYDROMORPHONE HCL 1 MG/ML IJ SOLN
INTRAMUSCULAR | Status: DC | PRN
  Administered 2023-09-20: .5 mg via INTRAVENOUS

## 2023-09-20 MED ORDER — ONDANSETRON HCL 4 MG PO TABS: 4.0000 mg | ORAL_TABLET | Freq: Four times a day (QID) | ORAL | Status: DC | PRN

## 2023-09-20 MED ORDER — METHOCARBAMOL 500 MG PO TABS
500.0000 mg | ORAL_TABLET | Freq: Three times a day (TID) | ORAL | 0 refills | Status: AC | PRN
Start: 2023-09-20 — End: 2023-09-25

## 2023-09-20 MED ORDER — LISINOPRIL-HYDROCHLOROTHIAZIDE 20-25 MG PO TABS
1.0000 | ORAL_TABLET | Freq: Every morning | ORAL | Status: DC
Start: 2023-09-21 — End: 2023-09-20

## 2023-09-20 MED ORDER — FENTANYL CITRATE (PF) 100 MCG/2ML IJ SOLN
25.0000 ug | INTRAMUSCULAR | Status: DC | PRN
  Administered 2023-09-20 (×3): 25 ug via INTRAVENOUS
  Administered 2023-09-20: 50 ug via INTRAVENOUS
  Administered 2023-09-20: 25 ug via INTRAVENOUS

## 2023-09-20 MED ORDER — ORAL CARE MOUTH RINSE: 15.0000 mL | Freq: Once | OROMUCOSAL | Status: AC

## 2023-09-20 MED ORDER — PROPOFOL 500 MG/50ML IV EMUL
INTRAVENOUS | Status: DC | PRN
  Administered 2023-09-20: 35 ug/kg/min via INTRAVENOUS

## 2023-09-20 MED ORDER — PROPOFOL 10 MG/ML IV BOLUS
INTRAVENOUS | Status: DC | PRN
  Administered 2023-09-20: 160 mg via INTRAVENOUS

## 2023-09-20 MED ORDER — CHLORHEXIDINE GLUCONATE 4 % EX SOLN: 1.0000 | CUTANEOUS | 1 refills | Status: DC

## 2023-09-20 MED ORDER — FENTANYL CITRATE (PF) 250 MCG/5ML IJ SOLN
INTRAMUSCULAR | Status: DC | PRN
  Administered 2023-09-20 (×3): 50 ug via INTRAVENOUS

## 2023-09-20 MED ORDER — OXYCODONE HCL 5 MG PO TABS
5.0000 mg | ORAL_TABLET | Freq: Once | ORAL | Status: AC | PRN
  Administered 2023-09-20: 5 mg via ORAL

## 2023-09-20 MED ORDER — PHENOL 1.4 % MT LIQD: 1.0000 | OROMUCOSAL | Status: DC | PRN

## 2023-09-20 MED ORDER — HYDROMORPHONE HCL 1 MG/ML IJ SOLN
INTRAMUSCULAR | Status: AC
  Filled 2023-09-20: qty 0.5

## 2023-09-20 MED ORDER — OXYCODONE HCL 5 MG PO TABS
ORAL_TABLET | ORAL | Status: AC
  Filled 2023-09-20: qty 1

## 2023-09-20 MED ORDER — BUPIVACAINE-EPINEPHRINE (PF) 0.25% -1:200000 IJ SOLN
INTRAMUSCULAR | Status: AC
  Filled 2023-09-20: qty 30

## 2023-09-20 MED ORDER — TRANEXAMIC ACID-NACL 1000-0.7 MG/100ML-% IV SOLN
1000.0000 mg | INTRAVENOUS | Status: AC
  Administered 2023-09-20: 1000 mg via INTRAVENOUS
  Filled 2023-09-20: qty 100

## 2023-09-20 MED ORDER — MENTHOL 3 MG MT LOZG
1.0000 | LOZENGE | OROMUCOSAL | Status: DC | PRN
Start: 2023-09-20 — End: 2023-09-21

## 2023-09-20 MED ORDER — ACETAMINOPHEN 325 MG PO TABS: 650.0000 mg | ORAL_TABLET | ORAL | Status: DC | PRN

## 2023-09-20 MED ORDER — HYDROCHLOROTHIAZIDE 25 MG PO TABS
25.0000 mg | ORAL_TABLET | Freq: Every day | ORAL | Status: DC
  Filled 2023-09-20: qty 1

## 2023-09-20 MED ORDER — SCOPOLAMINE 1 MG/3DAYS TD PT72SCOPOLAMINE 1 MG/3DAYS
1.0000 | MEDICATED_PATCH | TRANSDERMAL | Status: DC
Start: 2023-09-20 — End: 2023-09-20
  Administered 2023-09-20: 1.5 mg via TRANSDERMAL

## 2023-09-20 MED ORDER — PHENYLEPHRINE HCL-NACL 20-0.9 MG/250ML-% IV SOLN
INTRAVENOUS | Status: AC
  Filled 2023-09-20: qty 250

## 2023-09-20 MED ORDER — PHENYLEPHRINE 80 MCG/ML (10ML) SYRINGE FOR IV PUSH (FOR BLOOD PRESSURE SUPPORT)
PREFILLED_SYRINGE | INTRAVENOUS | Status: DC | PRN
  Administered 2023-09-20 (×2): 80 ug via INTRAVENOUS

## 2023-09-20 SURGICAL SUPPLY — 59 items
BAG COUNTER SPONGE SURGICOUNT (BAG) IMPLANT
CANISTER SUCT 3000ML PPV (MISCELLANEOUS) ×1 IMPLANT
CHARGER BATTERY NEUROSTIM ABT (NEUROSURGERY SUPPLIES) IMPLANT
CLSR STERI-STRIP ANTIMIC 1/2X4 (GAUZE/BANDAGES/DRESSINGS) ×1 IMPLANT
CONTROLLER NEUROSTIM PATIENT (NEUROSURGERY SUPPLIES) IMPLANT
COVER MAYO STAND STRL (DRAPES) ×1 IMPLANT
COVER PROBE W GEL 5X96 (DRAPES) IMPLANT
COVER SURGICAL LIGHT HANDLE (MISCELLANEOUS) ×1 IMPLANT
DRAPE C-ARM 42X72 X-RAY (DRAPES) ×1 IMPLANT
DRAPE SURG 17X23 STRL (DRAPES) ×1 IMPLANT
DRAPE U-SHAPE 47X51 STRL (DRAPES) ×1 IMPLANT
DRSG OPSITE POSTOP 4X6 (GAUZE/BANDAGES/DRESSINGS) ×1 IMPLANT
DURAPREP 26ML APPLICATOR (WOUND CARE) ×1 IMPLANT
ELECT BLADE 4.0 EZ CLEAN MEGAD (MISCELLANEOUS)
ELECT CAUTERY BLADE 6.4 (BLADE) IMPLANT
ELECT PENCIL ROCKER SW 15FT (MISCELLANEOUS) ×1 IMPLANT
ELECT REM PT RETURN 9FT ADLT (ELECTROSURGICAL) ×1
ELECTRODE BLDE 4.0 EZ CLN MEGD (MISCELLANEOUS) IMPLANT
ELECTRODE REM PT RTRN 9FT ADLT (ELECTROSURGICAL) ×1 IMPLANT
GENERATOR NEUROSTIM ETERNA 16 (Generator) IMPLANT
GLOVE BIO SURGEON STRL SZ7 (GLOVE) ×1 IMPLANT
GLOVE BIOGEL PI IND STRL 7.0 (GLOVE) ×1 IMPLANT
GLOVE BIOGEL PI IND STRL 8.5 (GLOVE) ×1 IMPLANT
GLOVE SS N UNI LF 8.5 STRL (GLOVE) ×1 IMPLANT
GOWN STRL REUS W/ TWL LRG LVL3 (GOWN DISPOSABLE) ×2 IMPLANT
GOWN STRL REUS W/TWL 2XL LVL3 (GOWN DISPOSABLE) ×1 IMPLANT
KIT BASIN OR (CUSTOM PROCEDURE TRAY) ×1 IMPLANT
KIT CHARGER NEUROSTIM ABT (NEUROSURGERY SUPPLIES) IMPLANT
KIT TURNOVER KIT B (KITS) ×1 IMPLANT
LEAD PADL TRICENTRUS 3 COLUMN (Neurosurgery Supplies) IMPLANT
MAGNET NEUROSTIM ETERNA PAT (NEUROSURGERY SUPPLIES) IMPLANT
MANUAL PATIENT NEUROSTIM DISP (MISCELLANEOUS) IMPLANT
NDL 22X1.5 STRL (OR ONLY) (MISCELLANEOUS) ×1 IMPLANT
NDL MAYO TROCAR (NEEDLE) IMPLANT
NDL SPNL 18GX3.5 QUINCKE PK (NEEDLE) ×1 IMPLANT
NEEDLE 22X1.5 STRL (OR ONLY) (MISCELLANEOUS) ×1
NEEDLE MAYO TROCAR (NEEDLE) ×1
NEEDLE SPNL 18GX3.5 QUINCKE PK (NEEDLE) ×1
NS IRRIG 1000ML POUR BTL (IV SOLUTION) ×1 IMPLANT
PACK LAMINECTOMY ORTHO (CUSTOM PROCEDURE TRAY) ×1 IMPLANT
PACK UNIVERSAL I (CUSTOM PROCEDURE TRAY) ×1 IMPLANT
PAD ARMBOARD 7.5X6 YLW CONV (MISCELLANEOUS) ×3 IMPLANT
SPATULA SILICONE BRAIN 10MM (MISCELLANEOUS) IMPLANT
SPONGE SURGIFOAM ABS GEL 100 (HEMOSTASIS) ×1 IMPLANT
SPONGE T-LAP 4X18 ~~LOC~~+RFID (SPONGE) IMPLANT
STAPLER VISISTAT 35W (STAPLE) IMPLANT
SURGIFLO W/THROMBIN 8M KIT (HEMOSTASIS) ×1 IMPLANT
SUT BONE WAX W31G (SUTURE) ×1 IMPLANT
SUT ETHIBOND 2 OS 4 DA (SUTURE) ×1 IMPLANT
SUT ETHIBOND NAB CT1 #1 30IN (SUTURE) IMPLANT
SUT MNCRL AB 3-0 PS2 18 (SUTURE) ×2 IMPLANT
SUT VIC AB 1 CT1 18XCR BRD 8 (SUTURE) ×2 IMPLANT
SUT VIC AB 2-0 CT1 18 (SUTURE) ×1 IMPLANT
SYR BULB IRRIG 60ML STRL (SYRINGE) ×1 IMPLANT
SYR CONTROL 10ML LL (SYRINGE) ×1 IMPLANT
TOWEL GREEN STERILE (TOWEL DISPOSABLE) ×1 IMPLANT
TOWEL GREEN STERILE FF (TOWEL DISPOSABLE) ×1 IMPLANT
WATER STERILE IRR 1000ML POUR (IV SOLUTION) ×1 IMPLANT
YANKAUER SUCT BULB TIP NO VENT (SUCTIONS) ×1 IMPLANT

## 2023-09-20 NOTE — H&P (Signed)
History: Ruth Shah is a very pleasant 47 year old man with longstanding low back and lower extremity dysesthesias. Patient has a postlaminectomy syndrome with ongoing pain. She underwent a spinal cord stimulator trial and had significant temporary improvement during the trial. As result of the successful trial she is elected to move forward with permanent implantation of the spinal cord stimulator.  Past Medical History:  Diagnosis Date   ADD (attention deficit disorder)    Anxiety    Arthritis    Asthma    mild seasonal reactions to mold and mildew   Back pain    Depression    Family history of adverse reaction to anesthesia    mother has severe PONV   Fatty liver    Fibromyalgia    GERD (gastroesophageal reflux disease)    Herpes simplex disease    History of hiatal hernia    History of kidney stones    Hypertension    Migraines    Paresthesia of both hands    PONV (postoperative nausea and vomiting)    Ulcer, colon    Urticaria     Allergies  Allergen Reactions   Dog Epithelium (Canis Lupus Familiaris) Swelling and Cough    Other reaction(s): eye redness, eye swelling, respiratory distress   Morphine And Codeine Itching    Felt like burning all over   Dilaudid [Hydromorphone Hcl] Nausea And Vomiting   Duloxetine Other (See Comments)    Felt like she was floating and "didn't feel right" on it   Sulfa Antibiotics Nausea Only   Toradol [Ketorolac Tromethamine] Nausea Only   Vicodin [Hydrocodone-Acetaminophen] Itching    No current facility-administered medications on file prior to encounter.   Current Outpatient Medications on File Prior to Encounter  Medication Sig Dispense Refill   acetaminophen (TYLENOL) 500 MG tablet Take 1,000 mg by mouth every 6 (six) hours as needed for moderate pain (pain score 4-6) or headache.     albuterol (VENTOLIN HFA) 108 (90 Base) MCG/ACT inhaler Inhale 1-2 puffs into the lungs every 6 (six) hours as needed for wheezing or shortness  of breath.     cetirizine (ZYRTEC) 10 MG tablet Take 10 mg by mouth daily.     lisinopril-hydrochlorothiazide (ZESTORETIC) 20-25 MG tablet Take 1 tablet by mouth in the morning.     pantoprazole (PROTONIX) 40 MG tablet Take 1 tablet (40 mg total) by mouth 2 (two) times daily before a meal. 180 tablet 1   SUMAtriptan (IMITREX) 100 MG tablet Take 100 mg by mouth every 2 (two) hours as needed for migraine.      Physical Exam: Vitals:   09/20/23 0620  BP: (!) 130/92  Pulse: 76  Resp: 17  Temp: 97.6 F (36.4 C)  SpO2: 96%   Body mass index is 34.54 kg/m. Clinical exam: Ruth Shah is a pleasant individual, who appears younger than their stated age.  She is alert and orientated 3.  No shortness of breath, chest pain.  Abdomen is soft and non-tender, negative loss of bowel and bladder control, no rebound tenderness.  Negative: skin lesions abrasions contusions  Peripheral pulses: 2+ peripheral pulses bilaterally in the lower extremity. LE compartments are: Soft and nontender.  Gait pattern: normal  Assistive devices: none  Neuro: Intermittent dysesthesias into the left lower extremity. Negative Babinski test, no clonus, negative straight leg raise test. 5/5 motor strength in the lower extremity bilaterally. 1+ deep tendon reflexes symmetrically.  Musculoskeletal: Significant back pain with palpation and range of motion. Pain  starts in the midline and radiates into the paraspinal region. No SI joint pain.  Imaging: X-rays of the lumbar spine are essentially unremarkable. No scoliosis or spondylolisthesis.  Thoracic MRI: completed on 10/27/2022. No cord signal changes. No evidence of disc herniation or stenosis. No compression that would prohibit implantation of a spinal cord stimulator.   A/P: Ruth Shah is a very pleasant 47 year old woman who has had chronic back buttock and occasional intermittent lower extremity dysesthesias. Patient had a successful spinal cord stimulator trial a with  greater than 50% improvement in her pain and quality of life. As result of the successful trial she presents today for permanent implantation. I have gone over the surgical procedure including the risks, benefits, and alternatives with her in great detail. All of her questions were encouraged and addressed. Risks and benefits of surgery were discussed with the patient. These include: Infection, bleeding, death, stroke, paralysis, ongoing or worse pain, need for additional surgery, leak of spinal fluid, Failure of the battery requiring reoperation. Inability to place the paddle requiring the surgery to be aborted. Migration of the lead, failure to obtain results similar to the trial.

## 2023-09-20 NOTE — Brief Op Note (Signed)
09/20/2023  9:42 AM  PATIENT:  Ruth Shah  47 y.o. female  PRE-OPERATIVE DIAGNOSIS:  Chronic pain syndrome status post successful spinal cord stimulator trial  POST-OPERATIVE DIAGNOSIS:  Chronic pain syndrome status post successful spinal cord stimulator trial  PROCEDURE:  Procedure(s) with comments: SPINAL CORD STIMULATOR INSERTION (N/A) - 3 C-Bed  SURGEON:  Surgeons and Role:    Venita Lick, MD - Primary  PHYSICIAN ASSISTANT:   ASSISTANTS: Luther Bradley   ANESTHESIA:   general  EBL:  20 mL   BLOOD ADMINISTERED:none  DRAINS: none   LOCAL MEDICATIONS USED:  MARCAINE     SPECIMEN:  No Specimen  DISPOSITION OF SPECIMEN:  N/A  COUNTS:  YES  TOURNIQUET:  * No tourniquets in log *  DICTATION: .Dragon Dictation  PLAN OF CARE: Admit for overnight observation  PATIENT DISPOSITION:  PACU - hemodynamically stable.

## 2023-09-20 NOTE — Transfer of Care (Signed)
Immediate Anesthesia Transfer of Care Note  Patient: Ruth Shah  Procedure(s) Performed: SPINAL CORD STIMULATOR INSERTION  Patient Location: PACU  Anesthesia Type:General  Level of Consciousness: awake and alert   Airway & Oxygen Therapy: Patient Spontanous Breathing  Post-op Assessment: Report given to RN  Post vital signs: Reviewed and stable  Last Vitals:  Vitals Value Taken Time  BP 140/108 09/20/23 0945  Temp    Pulse 84 09/20/23 0947  Resp 17 09/20/23 0947  SpO2 98 % 09/20/23 0947  Vitals shown include unfiled device data.  Last Pain:  Vitals:   09/20/23 0620  TempSrc: Oral  PainSc:       Patients Stated Pain Goal: 1 (09/20/23 0617)  Complications: No notable events documented.

## 2023-09-20 NOTE — Op Note (Signed)
OPERATIVE REPORT  DATE OF SURGERY: 09/20/2023  PATIENT NAME:  Ruth Shah MRN: [id number] DOB: January 16, 1976  PCP: Royann Shivers, PA-C  PRE-OPERATIVE DIAGNOSIS: Failed back syndrome.  Status post successful spinal cord stimulator trial  POST-OPERATIVE DIAGNOSIS: Same  PROCEDURE:   Implantation of spinal cord stimulator  SURGEON:  Venita Lick, MD  PHYSICIAN ASSISTANT: Luther Bradley  ANESTHESIA:   General  EBL: See anesthesia report   Complications: None  Implants: Abbott spinal cord stimulator:Tricentrus MRI compatible paddle.  Eterna pulse generator.  BRIEF HISTORY: Ruth Shah is a 47 y.o. female who has had chronic debilitating pain.  She had a prior lumbar decompression but unfortunately continued to have neuropathic pain and loss in quality of life.  She underwent a spinal cord stimulator trial and noted significant improvement in her pain and quality of life.  As result of the successful trial we elected to move forward with permanent implant.  PROCEDURE DETAILS: Patient was brought into the operating room and was properly positioned on the operating room table.  After induction with general anesthesia the patient was endotracheally intubated.  A timeout was taken to confirm all important data: including patient, procedure, and the level. Teds, SCD's were applied.   Patient was turned prone onto the Wilson frame and all bony prominences were padded.  The thoracolumbar spine was prepped and draped in a standard fashion.  After discussion with the representative I elected to move forward with a T10 laminotomy so that the paddle could be placed at the T8-9 level.  This was the area of maximum coverage during the trial.  Using fluoroscopy in the AP plane identify the T12 vertebral body as the last rib bearing thoracic vertebra.  I marked this area and then I marked out the T10 pedicle.  I infiltrated the incision with quarter percent Marcaine and made my midline incision.   Sharp dissection was carried out down to the deep fascia.  I incised the deep fascia bilaterally and stripped the paraspinal muscles to expose the spinous process of T10 and T11.  Using fluoroscopy identified the T10/11 interspinous process space.  I then used a double-action Leksell rongeur to remove the spinous process of T10.  The portion of the inferior aspect of T9 also inadvertently was removed as it was adherent to the interspinous process ligament.  Hemostasis was obtained and then I used a 2 mm Kerrison rongeur to perform a small laminotomy of T10.  I then dissected through the central raphae of the ligamentum flavum and resected the ligamentum flavum to expose the dorsal epidural fat and dorsal aspect of the thecal sac.  I was able to easily pass the paddle.  No resistance and so we moved forward with placing the paddle.  The paddle was obtained and gently passed under the T10 lamina up to the level of the T7-8 disc space.  It was slightly to the right.  I then took AP and lateral x-rays and confirmed with the Abbott rep that the paddle was probably placed to cover the area that was covered during the trial period.  Once I confirm satisfactory position I then secured the leads directly to the spinous process of T11 with an Ethibond suture.  With the leads secured I then passed the leads through the T11-12 interspinous process space and wrapped the lead around the T11 sinus process to prevent migration of the implant.  Prior to surgery I did mark out the incision site for the battery and so I  infiltrated this with quarter percent Marcaine.  Incision was made in the left gluteal region and I created a pocket approximately 2-1/2 cm deep.  I then used the submuscular passer to pass the leads from the thoracic wound to the battery site.  I then obtained the battery and secured the leads into the battery.  The leads were then tested by the representative and there was 1 area of impedance that he felt was fine  and it would improve postoperatively.  The excess lead wrapped on the bottom surface of the battery and the battery was inserted.  I secured the battery with an Ethibond suture to the deep fascia.  Both wounds were now copiously irrigated with normal saline.  Thrombin Gelfoam was placed over the laminotomy site and I placed some Floseal to aid in hemostasis.  The deep fascia of both wounds were closed with interrupted #1 Vicryl sutures.  This was followed by a layer of interrupted 2-0 Vicryl sutures.  Skin was closed with 3-0 Monocryl.  The implant was finally tested after wound closure and there was no change.  Steri-Strips and dry dressings were applied and the patient was extubated and transferred the PACU without incident.  The end of the case all needle sponge counts were correct.    Venita Lick, MD 09/20/2023 9:33 AM

## 2023-09-20 NOTE — Discharge Instructions (Signed)

## 2023-09-20 NOTE — Anesthesia Postprocedure Evaluation (Signed)
Anesthesia Post Note  Patient: Ruth Shah  Procedure(s) Performed: SPINAL CORD STIMULATOR INSERTION     Patient location during evaluation: PACU Anesthesia Type: General Level of consciousness: awake and alert Pain management: pain level controlled Vital Signs Assessment: post-procedure vital signs reviewed and stable Respiratory status: spontaneous breathing, nonlabored ventilation, respiratory function stable and patient connected to nasal cannula oxygen Cardiovascular status: blood pressure returned to baseline and stable Postop Assessment: no apparent nausea or vomiting Anesthetic complications: no   No notable events documented.  Last Vitals:  Vitals:   09/20/23 1230 09/20/23 1245  BP: 129/72 127/78  Pulse: (!) 59 62  Resp: 13 13  Temp:    SpO2: 97% 97%    Last Pain:  Vitals:   09/20/23 1245  TempSrc:   PainSc: 6                  Jovaun Levene

## 2023-09-21 ENCOUNTER — Encounter (HOSPITAL_COMMUNITY): Payer: Self-pay | Admitting: Orthopedic Surgery

## 2023-09-21 DIAGNOSIS — M961 Postlaminectomy syndrome, not elsewhere classified: Secondary | ICD-10-CM | POA: Diagnosis not present

## 2023-09-21 NOTE — TOC Transition Note (Signed)
Transition of Care [facility]) - Discharge Note   Patient Details  Name: Ruth Shah MRN: [id number] Date of Birth: November 05, 1975  [facility]) CM/SW Contact:  Kermit Balo, RN Phone Number: 09/21/2023, 9:29 AM   Clinical Narrative:     Pt is discharging home with self care. No follow up per PT.  Discharge RN to get her walker through the dc lounge that is supplied by [employer]. Pt has transportation home.  Final next level of care: Home/Self Care Barriers to Discharge: No Barriers Identified   Patient Goals and CMS Choice            Discharge Placement                       Discharge Plan and Services Additional resources added to the After Visit Summary for                  DME Arranged: Walker rolling DME Agency: [employer]     Representative spoke with at DME Agency: discharge lounge DME            Social Drivers of Health (SDOH) Interventions SDOH Screenings   Food Insecurity: No Food Insecurity (09/20/2023)  Housing: Low Risk  (09/20/2023)  Transportation Needs: No Transportation Needs (09/20/2023)  Utilities: Not At Risk (09/20/2023)  Tobacco Use: Low Risk  (09/20/2023)     Readmission Risk Interventions     No data to display

## 2023-09-21 NOTE — Evaluation (Signed)
Physical Therapy Evaluation and Discharge Patient Details Name: Ruth Shah MRN: [id number] DOB: 15-May-1976 Today's Date: 09/21/2023  History of Present Illness  47 year old adm 09/20/23 with longstanding low back and lower extremity dysesthesias. She underwent a spinal cord stimulator trial and had significant temporary improvement during the trial. As result of the successful trial she is elected to move forward with permanent implantation of the spinal cord stimulator 09/20/23.  Clinical Impression   Patient evaluated by Physical Therapy with no further acute PT needs identified. All education has been completed and the patient has no further questions.  See below for any follow-up Physical Therapy or equipment needs. PT is signing off. Thank you for this referral.         If plan is discharge home, recommend the following: A little help with bathing/dressing/bathroom;Assistance with cooking/housework;Assist for transportation;Help with stairs or ramp for entrance   Can travel by private vehicle        Equipment Recommendations Rolling walker (2 wheels)  Recommendations for Other Services       Functional Status Assessment Patient has had a recent decline in their functional status and demonstrates the ability to make significant improvements in function in a reasonable and predictable amount of time.     Precautions / Restrictions Precautions Precautions: Fall Precaution Comments: no back precautions ordered; pt reports MD told her no lifting >5# Required Braces or Orthoses:  (no brace ordered)      Mobility  Bed Mobility Overal bed mobility: Needs Assistance Bed Mobility: Rolling, Sidelying to Sit Rolling: Min assist, Used rails Sidelying to sit: Min assist, HOB elevated, Used rails       General bed mobility comments: pt normally sleeps on her couch; discussed could be difficult to get up from low surface so she may sleep in her bed; husband or teenagers can  assist her with mobility as needed    Transfers Overall transfer level: Needs assistance Equipment used: Rolling walker (2 wheels) Transfers: Sit to/from Stand Sit to Stand: Supervision           General transfer comment: attempted x2 without device and too painful; with RW from EOB and standard toilet    Ambulation/Gait Ambulation/Gait assistance: Supervision Gait Distance (Feet): 180 Feet Assistive device: Rolling walker (2 wheels) Gait Pattern/deviations: Step-through pattern, Decreased stride length   Gait velocity interpretation: 1.31 - 2.62 ft/sec, indicative of limited community ambulator   General Gait Details: light use of RW which she reports helps reduce back pain  Stairs Stairs: Yes Stairs assistance: Min assist Stair Management: No rails, Step to pattern, Forwards, With walker Number of Stairs: 2 General stair comments: educated to put RW up on an angle with someone to stabilize RW from behind; on descent, person in front to stabilize  Wheelchair Mobility     Tilt Bed    Modified Rankin (Stroke Patients Only)       Balance                                             Pertinent Vitals/Pain Pain Assessment Pain Assessment: 0-10 Pain Score: 7  Pain Location: low back Pain Descriptors / Indicators: Burning Pain Intervention(s): Limited activity within patient's tolerance, Monitored during session, Premedicated before session, Repositioned    Home Living Family/patient expects to be discharged to:: Private residence Living Arrangements: Spouse/significant other;Children (teenagers) Available Help at Discharge: Family;Available  24 hours/day Type of Home: House Home Access: Stairs to enter Entrance Stairs-Rails: None Entrance Stairs-Number of Steps: 1+2   Home Layout: Two level;Able to live on main level with bedroom/bathroom Home Equipment: None      Prior Function Prior Level of Function : Independent/Modified Independent                ADLs Comments: assist with socks sometimes     Extremity/Trunk Assessment   Upper Extremity Assessment Upper Extremity Assessment: Overall WFL for tasks assessed    Lower Extremity Assessment Lower Extremity Assessment: Overall WFL for tasks assessed    Cervical / Trunk Assessment Cervical / Trunk Assessment: Back Surgery  Communication   Communication Communication: No apparent difficulties Cueing Techniques: Verbal cues  Cognition Arousal: Alert Behavior During Therapy: WFL for tasks assessed/performed Overall Cognitive Status: Within Functional Limits for tasks assessed                                          General Comments General comments (skin integrity, edema, etc.): Back precaution handout provided and educated on techniques for lower body dressing (as demonstrated on handout). Pt reports she will have help if needed    Exercises     Assessment/Plan    PT Assessment Patient does not need any further PT services  PT Problem List         PT Treatment Interventions      PT Goals (Current goals can be found in the Care Plan section)  Acute Rehab PT Goals Patient Stated Goal: return home today PT Goal Formulation: All assessment and education complete, DC therapy    Frequency       Co-evaluation               AM-PAC PT "6 Clicks" Mobility  Outcome Measure Help needed turning from your back to your side while in a flat bed without using bedrails?: A Little Help needed moving from lying on your back to sitting on the side of a flat bed without using bedrails?: A Little Help needed moving to and from a bed to a chair (including a wheelchair)?: A Little Help needed standing up from a chair using your arms (e.g., wheelchair or bedside chair)?: A Little Help needed to walk in hospital room?: A Little Help needed climbing 3-5 steps with a railing? : A Little 6 Click Score: 18    End of Session Equipment Utilized  During Treatment: Gait belt Activity Tolerance: Patient tolerated treatment well Patient left: in chair;with call bell/phone within reach Nurse Communication: Mobility status;Other (comment) (needs RW) PT Visit Diagnosis: Other abnormalities of gait and mobility (R26.89)    Time: 1610-9604 PT Time Calculation (min) (ACUTE ONLY): 36 min   Charges:   PT Evaluation $PT Eval Low Complexity: 1 Low PT Treatments $Gait Training: 8-22 mins PT General Charges $$ ACUTE PT VISIT: 1 Visit          Jerolyn Center, PT Acute Rehabilitation Services  Office [phone]   Zena Amos 09/21/2023, 9:25 AM

## 2023-09-21 NOTE — Plan of Care (Signed)
  Problem: Safety: Goal: Ability to remain free from injury will improve Outcome: Not Progressing   Problem: Pain Management: Goal: General experience of comfort will improve Outcome: Not Progressing   Problem: Activity: Goal: Ability to avoid complications of mobility impairment will improve Outcome: Not Progressing Goal: Ability to tolerate increased activity will improve Outcome: Not Progressing Goal: Will remain free from falls Outcome: Not Progressing   Problem: Bowel/Gastric: Goal: Gastrointestinal status for postoperative course will improve Outcome: Not Progressing   Problem: Pain Management: Goal: Pain level will decrease Outcome: Not Progressing

## 2023-09-21 NOTE — Discharge Summary (Signed)
Patient ID: Ruth Shah MRN: [id number] DOB/AGE: May 11, 1976 47 y.o.  Admit date: 09/20/2023 Discharge date: 09/21/2023  Admission Diagnoses:  Principal Problem:   Chronic pain   Discharge Diagnoses:  Principal Problem:   Chronic pain  status post Procedure(s): SPINAL CORD STIMULATOR INSERTION  Past Medical History:  Diagnosis Date   ADD (attention deficit disorder)    Anxiety    Arthritis    Asthma    mild seasonal reactions to mold and mildew   Back pain    Depression    Family history of adverse reaction to anesthesia    mother has severe PONV   Fatty liver    Fibromyalgia    GERD (gastroesophageal reflux disease)    Herpes simplex disease    History of hiatal hernia    History of kidney stones    Hypertension    Migraines    Paresthesia of both hands    PONV (postoperative nausea and vomiting)    Ulcer, colon    Urticaria     Surgeries: Procedure(s): SPINAL CORD STIMULATOR INSERTION on 09/20/2023   Consultants:   Discharged Condition: Improved  Hospital Course: Ruth Shah is an 47 y.o. female who was admitted 09/20/2023 for operative treatment of Chronic pain. Patient failed conservative treatments (please see the history and physical for the specifics) and had severe unremitting pain that affects sleep, daily activities and work/hobbies. After pre-op clearance, the patient was taken to the operating room on 09/20/2023 and underwent  Procedure(s): SPINAL CORD STIMULATOR INSERTION.    Patient was given perioperative antibiotics:  Anti-infectives (From admission, onward)    Start     Dose/Rate Route Frequency Ordered Stop   09/20/23 1515  ceFAZolin (ANCEF) IVPB 1 g/50 mL premix        1 g 100 mL/hr over 30 Minutes Intravenous Every 8 hours 09/20/23 1422 09/20/23 2330   09/20/23 0557  ceFAZolin (ANCEF) IVPB 2g/100 mL premix        2 g 200 mL/hr over 30 Minutes Intravenous 30 min pre-op 09/20/23 0557 09/20/23 0811        Patient was given  sequential compression devices and early ambulation to prevent DVT.   Patient benefited maximally from hospital stay and there were no complications. At the time of discharge, the patient was urinating/moving their bowels without difficulty, tolerating a regular diet, pain is controlled with oral pain medications and they have been cleared by PT/OT.   Recent vital signs: Patient Vitals for the past 24 hrs:  BP Temp Temp src Pulse Resp SpO2  09/21/23 0448 95/64 99 F (37.2 C) Oral 97 16 95 %  09/20/23 2335 126/71 98 F (36.7 C) Oral 81 16 96 %  09/20/23 2046 119/74 98.5 F (36.9 C) Oral 75 16 95 %  09/20/23 1512 126/65 98.1 F (36.7 C) Oral (!) 58 17 93 %  09/20/23 1421 125/83 98.1 F (36.7 C) Oral (!) 58 14 97 %  09/20/23 1400 117/68 97.7 F (36.5 C) -- (!) 54 11 93 %  09/20/23 1345 102/67 -- -- (!) 55 12 93 %  09/20/23 1330 111/67 -- -- (!) 56 11 93 %  09/20/23 1315 125/71 -- -- (!) 59 12 93 %  09/20/23 1300 129/77 -- -- 63 12 95 %  09/20/23 1245 127/78 -- -- 62 13 97 %  09/20/23 1230 129/72 -- -- (!) 59 13 97 %  09/20/23 1215 122/80 -- -- 63 15 97 %  09/20/23 1200 119/70 -- --  61 12 98 %  09/20/23 1145 124/79 -- -- 60 14 97 %  09/20/23 1130 127/77 -- -- 62 12 97 %  09/20/23 1115 123/82 -- -- 65 12 96 %  09/20/23 1100 122/79 -- -- 72 13 95 %  09/20/23 1045 124/82 -- -- 66 12 97 %  09/20/23 1030 137/82 -- -- 74 15 95 %  09/20/23 1015 121/79 -- -- 73 16 90 %  09/20/23 1000 124/78 -- -- 72 14 99 %  09/20/23 0945 136/72 (!) 97.4 F (36.3 C) -- 96 15 99 %     Recent laboratory studies: No results for input(s): "WBC", "HGB", "HCT", "PLT", "NA", "K", "CL", "CO2", "BUN", "CREATININE", "GLUCOSE", "INR", "CALCIUM" in the last 72 hours.  Invalid input(s): "PT", "2"   Discharge Medications:   Allergies as of 09/21/2023       Reactions   Dog Epithelium (canis Lupus Familiaris) Swelling, Cough   Other reaction(s): eye redness, eye swelling, respiratory distress   Morphine And  Codeine Itching   Felt like burning all over   Dilaudid [hydromorphone Hcl] Nausea And Vomiting   Duloxetine Other (See Comments)   Felt like she was floating and "didn't feel right" on it   Sulfa Antibiotics Nausea Only   Toradol [ketorolac Tromethamine] Nausea Only   Vicodin [hydrocodone-acetaminophen] Itching        Medication List     STOP taking these medications    acetaminophen 500 MG tablet Commonly known as: TYLENOL       TAKE these medications    albuterol 108 (90 Base) MCG/ACT inhaler Commonly known as: VENTOLIN HFA Inhale 1-2 puffs into the lungs every 6 (six) hours as needed for wheezing or shortness of breath.   cetirizine 10 MG tablet Commonly known as: ZYRTEC Take 10 mg by mouth daily.   chlorhexidine 4 % external liquid Commonly known as: HIBICLENS Apply 15 mLs (1 Application total) topically as directed for 30 doses. Use as directed daily for 5 days every other week for 6 weeks.   lisinopril-hydrochlorothiazide 20-25 MG tablet Commonly known as: ZESTORETIC Take 1 tablet by mouth in the morning.   methocarbamol 500 MG tablet Commonly known as: ROBAXIN Take 1 tablet (500 mg total) by mouth every 8 (eight) hours as needed for up to 5 days for muscle spasms.   mupirocin ointment 2 % Commonly known as: BACTROBAN Place 1 Application into the nose 2 (two) times daily for 60 doses. Use as directed 2 times daily for 5 days every other week for 6 weeks.   ondansetron 4 MG tablet Commonly known as: Zofran Take 1 tablet (4 mg total) by mouth every 8 (eight) hours as needed for nausea or vomiting.   oxyCODONE-acetaminophen 10-325 MG tablet Commonly known as: Percocet Take 1 tablet by mouth every 6 (six) hours as needed for up to 5 days for pain.   pantoprazole 40 MG tablet Commonly known as: PROTONIX Take 1 tablet (40 mg total) by mouth 2 (two) times daily before a meal.   SUMAtriptan 100 MG tablet Commonly known as: IMITREX Take 100 mg by mouth  every 2 (two) hours as needed for migraine.        Diagnostic Studies: DG THORACOLUMBAR SPINE Result Date: 09/20/2023 CLINICAL DATA:  Spinal cord stimulator placement. EXAM: THORACOLUMBAR SPINE 1V Radiation exposure index: 21.16 mGy. COMPARISON:  None Available. FINDINGS: Three intraoperative fluoroscopic images were obtained of the lower thoracic spine. These images demonstrate distal lead of stimulator device at T8 and T9  levels. IMPRESSION: Fluoroscopic guidance provided during thoracic spinal cord stimulator placement. Electronically Signed   By: Lupita Raider M.D.   On: 09/20/2023 10:13   DG C-Arm 1-60 Min-No Report Result Date: 09/20/2023 Fluoroscopy was utilized by the requesting physician.  No radiographic interpretation.   DG C-Arm 1-60 Min-No Report Result Date: 09/20/2023 Fluoroscopy was utilized by the requesting physician.  No radiographic interpretation.    Discharge Instructions     Incentive spirometry RT   Complete by: As directed         Follow-up Information     Venita Lick, MD. Schedule an appointment as soon as possible for a visit in 2 week(s).   Specialty: Orthopedic Surgery Why: If symptoms worsen, For suture removal, For wound re-check Contact information: [address] [place] [address] [phone]                 Discharge Plan:  discharge to home  Disposition: Ruth Shah underwent placement of SCS implant yesterday. Surgery was uneventful.  She remains neurologically intact, dressing C/D/I.  She is voiding spontaneously and tolerating a regular diet.  All appropriate d/c instructions and medications have been provided.  She will f/u with me in 2 weeks for wound check.    Signed: Alvy Beal  [facility] [phone] 09/21/2023, 7:13 AM

## 2023-09-21 NOTE — Plan of Care (Signed)
  Problem: Education: Goal: Knowledge of General Education information will improve Description: Including pain rating scale, medication(s)/side effects and non-pharmacologic comfort measures Outcome: Adequate for Discharge   Problem: Health Behavior/Discharge Planning: Goal: Ability to manage health-related needs will improve Outcome: Adequate for Discharge   Problem: Clinical Measurements: Goal: Ability to maintain clinical measurements within normal limits will improve Outcome: Adequate for Discharge Goal: Will remain free from infection Outcome: Adequate for Discharge Goal: Diagnostic test results will improve Outcome: Adequate for Discharge Goal: Respiratory complications will improve Outcome: Adequate for Discharge Goal: Cardiovascular complication will be avoided Outcome: Adequate for Discharge   Problem: Activity: Goal: Risk for activity intolerance will decrease Outcome: Adequate for Discharge   Problem: Nutrition: Goal: Adequate nutrition will be maintained Outcome: Adequate for Discharge   Problem: Coping: Goal: Level of anxiety will decrease Outcome: Adequate for Discharge   Problem: Elimination: Goal: Will not experience complications related to bowel motility Outcome: Adequate for Discharge Goal: Will not experience complications related to urinary retention Outcome: Adequate for Discharge   Problem: Pain Management: Goal: General experience of comfort will improve Outcome: Adequate for Discharge   Problem: Safety: Goal: Ability to remain free from injury will improve Outcome: Adequate for Discharge   Problem: Skin Integrity: Goal: Risk for impaired skin integrity will decrease Outcome: Adequate for Discharge   Problem: Education: Goal: Ability to verbalize activity precautions or restrictions will improve Outcome: Adequate for Discharge Goal: Knowledge of the prescribed therapeutic regimen will improve Outcome: Adequate for Discharge Goal:  Understanding of discharge needs will improve Outcome: Adequate for Discharge   Problem: Activity: Goal: Ability to avoid complications of mobility impairment will improve Outcome: Adequate for Discharge Goal: Ability to tolerate increased activity will improve Outcome: Adequate for Discharge Goal: Will remain free from falls Outcome: Adequate for Discharge   Problem: Bowel/Gastric: Goal: Gastrointestinal status for postoperative course will improve Outcome: Adequate for Discharge   Problem: Clinical Measurements: Goal: Ability to maintain clinical measurements within normal limits will improve Outcome: Adequate for Discharge Goal: Postoperative complications will be avoided or minimized Outcome: Adequate for Discharge Goal: Diagnostic test results will improve Outcome: Adequate for Discharge   Problem: Pain Management: Goal: Pain level will decrease Outcome: Adequate for Discharge   Problem: Skin Integrity: Goal: Will show signs of wound healing Outcome: Adequate for Discharge   Problem: Health Behavior/Discharge Planning: Goal: Identification of resources available to assist in meeting health care needs will improve Outcome: Adequate for Discharge   Problem: Bladder/Genitourinary: Goal: Urinary functional status for postoperative course will improve Outcome: Adequate for Discharge

## 2023-09-21 NOTE — Progress Notes (Signed)
Explained discharge instructions to patient. Reviewed follow up appointment and next medication administration times. Also reviewed education. Patient verbalized having an understanding for instructions given. All belongings are in the patient's possession. IV was removed. CCMD was notified. No other needs verbalized. Transported downstairs to the discharge lounge.

## 2024-01-08 ENCOUNTER — Ambulatory Visit (INDEPENDENT_AMBULATORY_CARE_PROVIDER_SITE_OTHER): Admitting: Gastroenterology

## 2024-01-08 ENCOUNTER — Encounter: Payer: Self-pay | Admitting: Gastroenterology

## 2024-01-08 ENCOUNTER — Other Ambulatory Visit (HOSPITAL_COMMUNITY): Payer: Self-pay | Admitting: Physician Assistant

## 2024-01-08 VITALS — BP 110/74 | HR 79 | Temp 98.2°F | Ht 62.0 in | Wt 195.2 lb

## 2024-01-08 DIAGNOSIS — M542 Cervicalgia: Secondary | ICD-10-CM

## 2024-01-08 DIAGNOSIS — K760 Fatty (change of) liver, not elsewhere classified: Secondary | ICD-10-CM | POA: Diagnosis not present

## 2024-01-08 DIAGNOSIS — K219 Gastro-esophageal reflux disease without esophagitis: Secondary | ICD-10-CM | POA: Diagnosis not present

## 2024-01-08 DIAGNOSIS — Z8719 Personal history of other diseases of the digestive system: Secondary | ICD-10-CM

## 2024-01-08 DIAGNOSIS — R1310 Dysphagia, unspecified: Secondary | ICD-10-CM

## 2024-01-08 DIAGNOSIS — R1319 Other dysphagia: Secondary | ICD-10-CM

## 2024-01-08 NOTE — H&P (View-Only) (Signed)
 GI Office Note    Referring Provider: Royann Shivers, * Primary Care Physician:  Sheela Stack  Primary Gastroenterologist: Hennie Duos. Marletta Lor, DO   Chief Complaint   Chief Complaint  Patient presents with   Dysphagia    Pt has had to have esophagus stretched before and now issue is coming back. Hand doctor wants to put her on Meloxicam or Celebrex.    History of Present Illness   Ruth Shah is a 48 y.o. female presenting today for dysphagia. H/o IBS, chronic right sided abd/rib pain, fatty liver (imaging in 2021, normal LFTS), GERD. Previously, negative celiac serologies. Bentyl caused constipation.Did not try [place].  Today: Solid food dysphagia, food sits in lower chest forever before it goes down. Feels esophageaus working to try it to get it go down, almost like contractions. Previously esophageal dilation really helped up until about 3 months ago. Recently seen by ENT for crampy pain in left neck. Worse with yawning and with food impactions. Affects voice. Has to wait for it to pass to talk. Lasting seconds at a time. Imaging planned in near future. States fiberoptic study by ENT was unremarkable.   Stools soft at times. BM 1-2 per day depending on what eats. Certain foods go straight through.   Going to see new rheumatologist in July, Dr. Corliss Skains. She has longstanding history of positive ANA, joint/muscle pain. Hurts constantly. 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 Orthopedist recommending meloxicam or celebrex which has helped her pain when she took a couple recently. She was concerned to start without GI approval due to prior history of remote ulcers and colonoscopy with cecal ulcer (thought to be NSAID related at that time but patient states she was taking ibuprofen rarely before colonoscopy).   Esophageal manometry 2019: -unremarkable, no significant motility issues  EGD  08/2019: -dysphagia due to benign appearing esophageal stricture in setting of uncontrolled GERD  Colonoscopy 07/2022: -non-bleeding internal hemorrhoids -single solitary ulcer in cecum s/p bx -ileum normal -random colon bx neg -cecal bx with ulcerated severe active colitis, non-specific and not characteristic for IBD, possibly NSAIDs  Medications   Current Outpatient Medications  Medication Sig Dispense Refill   albuterol (VENTOLIN HFA) 108 (90 Base) MCG/ACT inhaler Inhale 1-2 puffs into the lungs every 6 (six) hours as needed for wheezing or shortness of breath.     cetirizine (ZYRTEC) 10 MG tablet Take 10 mg by mouth daily.     lisinopril-hydrochlorothiazide (ZESTORETIC) 20-25 MG tablet Take 1 tablet by mouth in the morning.     ondansetron (ZOFRAN) 4 MG tablet Take 1 tablet (4 mg total) by mouth every 8 (eight) hours as needed for nausea or vomiting. 20 tablet 0   pantoprazole (PROTONIX) 40 MG tablet Take 40 mg by mouth 2 (two) times daily before a meal.     No current facility-administered medications for this visit.    Allergies   Allergies as of 01/08/2024 - Review Complete 01/08/2024  Allergen Reaction Noted   Dog epithelium (canis lupus familiaris) Swelling and Cough 06/26/2022   Morphine and codeine Itching 09/22/2021   Dilaudid [hydromorphone hcl] Nausea And Vomiting 10/10/2017   Duloxetine Other (See Comments) 06/13/2018   Sulfa antibiotics Nausea Only 07/20/2016   Toradol [ketorolac tromethamine] Nausea Only 07/20/2016   Vicodin [hydrocodone-acetaminophen] Itching 08/12/2015     Past Medical History   Past Medical History:  Diagnosis Date  ADD (attention deficit disorder)    Anxiety    Arthritis    Asthma    mild seasonal reactions to mold and mildew   Back pain    Depression    Family history of adverse reaction to anesthesia    mother has severe PONV   Fatty liver    Fibromyalgia    GERD (gastroesophageal reflux disease)    Herpes simplex disease     History of hiatal hernia    History of kidney stones    Hypertension    Migraines    Paresthesia of both hands    PONV (postoperative nausea and vomiting)    Ulcer, colon    Urticaria     Past Surgical History   Past Surgical History:  Procedure Laterality Date   BIOPSY  09/23/2019   Procedure: BIOPSY;  Surgeon: West Bali, MD;  Location: [facility];  Service: Endoscopy;;  esophagus   BIOPSY  07/31/2022   Procedure: BIOPSY;  Surgeon: Lanelle Bal, DO;  Location: [facility];  Service: Endoscopy;;   CHOLECYSTECTOMY N/A 04/08/2020   Procedure: LAPAROSCOPIC CHOLECYSTECTOMY;  Surgeon: Franky Macho, MD;  Location: [facility];  Service: General;  Laterality: N/A;   collapsed lung     after rotator cuff surgery   COLONOSCOPY WITH PROPOFOL N/A 07/31/2022   Procedure: COLONOSCOPY WITH PROPOFOL;  Surgeon: Lanelle Bal, DO;  Location: [facility];  Service: Endoscopy;  Laterality: N/A;  10:30 am   ENDOMETRIAL ABLATION     ESOPHAGEAL MANOMETRY N/A 10/15/2017   Procedure: ESOPHAGEAL MANOMETRY (EM);  Surgeon: Napoleon Form, MD;  Location: [facility];  Service: Endoscopy;  Laterality: N/A;   ESOPHAGOGASTRODUODENOSCOPY (EGD) WITH PROPOFOL N/A 12/26/2016   Dr. Darrick Penna: one benign-appearing, intrinsic stenosis that was widely patent and traversed. Small hiatal hernia, few small sessile polyps. Chronic gastritis. Normal duodenum, negative celiac sprue.    ESOPHAGOGASTRODUODENOSCOPY (EGD) WITH PROPOFOL N/A 09/23/2019   Surgeon: West Bali, MD;  1 benign-appearing esophageal stenosis s/p dilation, esophagus was biopsied, normal examined stomach and duodenum.  Pathology with mild reactive changes, no increased intraepithelial eosinophils.   HERNIA REPAIR     inguinal as a baby    left ovarian removal due to cyst     PILONIDAL CYST / SINUS EXCISION  2009   RECTOCELE REPAIR N/A 10/02/2017   Procedure: POSTERIOR REPAIR (RECTOCELE);  Surgeon: Tilda Burrow, MD;  Location:  [facility];  Service: Gynecology;  Laterality: N/A;   SAVORY DILATION N/A 09/23/2019   Procedure: SAVORY DILATION;  Surgeon: West Bali, MD;  Location: [facility];  Service: Endoscopy;  Laterality: N/A;   SHOULDER ARTHROSCOPY WITH ROTATOR CUFF REPAIR Right 09/22/2021   Procedure: SHOULDER ARTHROSCOPY WITH ROTATOR CUFF REPAIR, BICEPS TENOTOMY, SUBACROMIAL DECOMPRESSION, DEBRIDEMENT;  Surgeon: Yolonda Kida, MD;  Location: [facility];  Service: Orthopedics;  Laterality: Right;  120   SINOSCOPY     SPINAL CORD STIMULATOR INSERTION N/A 09/20/2023   Procedure: SPINAL CORD STIMULATOR INSERTION;  Surgeon: Venita Lick, MD;  Location: [facility];  Service: Orthopedics;  Laterality: N/A;  3 C-Bed   TUBAL LIGATION     VAGINAL HYSTERECTOMY N/A 10/02/2017   Procedure: HYSTERECTOMY VAGINAL;  Surgeon: Tilda Burrow, MD;  Location: [facility];  Service: Gynecology;  Laterality: N/A;    Past Family History   Family History  Problem Relation Age of Onset   Cancer Mother        skin   Depression Mother  Hypertension Mother    Hyperlipidemia Mother    Diabetes Mother    Cancer Father        skin   Arthritis Father    Hypertension Father    Hyperlipidemia Father    Asthma Brother    Depression Maternal Grandmother    Hypertension Maternal Grandmother    Mental illness Maternal Grandmother    Cancer Maternal Grandfather        kidney   Hypertension Maternal Grandfather    Kidney disease Maternal Grandfather    Emphysema Paternal Grandmother    Alzheimer's disease Paternal Grandmother    Parkinson's disease Paternal Grandfather    Colon cancer Neg Hx    Colon polyps Neg Hx    Allergic rhinitis Neg Hx    Eczema Neg Hx    Urticaria Neg Hx     Past Social History   Social History   Socioeconomic History   Marital status: Married    Spouse name: Not on file   Number of children: 4   Years of education: Some College   Highest education level: Not on file  Occupational History   Not  on file  Tobacco Use   Smoking status: Never   Smokeless tobacco: Never  Vaping Use   Vaping status: Never Used  Substance and Sexual Activity   Alcohol use: Not Currently   Drug use: No   Sexual activity: Yes    Birth control/protection: Surgical    Comment: hyst  Other Topics Concern   Not on file  Social History Narrative   Lives at home with husband and children.   Right-handed.   4 cups caffeine per week.   Social Drivers of Corporate investment banker Strain: Not on file  Food Insecurity: Low Risk  (01/02/2024)   Received from [facility]   Hunger Vital Sign    Worried About Running Out of Food in the Last Year: Never true    Ran Out of Food in the Last Year: Never true  Transportation Needs: No Transportation Needs (01/02/2024)   Received from [facility]    In the past 12 months, has lack of reliable transportation kept you from medical appointments, meetings, work or from getting things needed for daily living? : No  Physical Activity: Not on file  Stress: Not on file  Social Connections: Not on file  Intimate Partner Violence: Not At Risk (09/20/2023)   Humiliation, Afraid, Rape, and Kick questionnaire    Fear of Current or Ex-Partner: No    Emotionally Abused: No    Physically Abused: No    Sexually Abused: No    Review of Systems   General: Negative for anorexia, weight loss, fever, chills, fatigue, weakness. ENT: Negative for hoarseness, nasal congestion. See hpi CV: Negative for chest pain, angina, palpitations, dyspnea on exertion, peripheral edema.  Respiratory: Negative for dyspnea at rest, dyspnea on exertion, cough, sputum, wheezing.  GI: See history of present illness. GU:  Negative for dysuria, hematuria, urinary incontinence, urinary frequency, nocturnal urination.  Endo: Negative for unusual weight change.     Physical Exam   BP 110/74 (BP Location: Left Arm, Patient Position: Sitting, Cuff Size: Large)   Pulse 79   Temp  98.2 F (36.8 C) (Temporal)   Ht 5\' 2"  (1.575 m)   Wt 195 lb 3.2 oz (88.5 kg)   BMI 35.70 kg/m    General: Well-nourished, well-developed in no acute distress.  Eyes: No icterus. Mouth: Oropharyngeal mucosa  moist and pink   Lungs: Clear to auscultation bilaterally.  Heart: Regular rate and rhythm, no murmurs rubs or gallops.  Abdomen: Bowel sounds are normal, nontender, nondistended, no hepatosplenomegaly or masses,  no abdominal bruits or hernia , no rebound or guarding.  Rectal: not performed  Extremities: No lower extremity edema. No clubbing or deformities. Neuro: Alert and oriented x 4   Skin: Warm and dry, no jaundice.   Psych: Alert and cooperative, normal mood and affect.  Labs   Lab Results  Component Value Date   WBC 10.2 09/10/2023   HGB 13.6 09/10/2023   HCT 41.5 09/10/2023   MCV 91.2 09/10/2023   PLT 315 09/10/2023   Lab Results  Component Value Date   NA 140 09/10/2023   CL 105 09/10/2023   K 3.9 09/10/2023   CO2 30 09/10/2023   BUN 14 09/10/2023   CREATININE 1.03 (H) 09/10/2023   GFRNONAA >60 09/10/2023   CALCIUM 9.3 09/10/2023   ALBUMIN 3.8 05/14/2023   GLUCOSE 86 09/10/2023   Lab Results  Component Value Date   ALT 18 05/14/2023   AST 18 05/14/2023   ALKPHOS 80 05/14/2023   BILITOT 0.9 05/14/2023     Imaging Studies   No results found.  Assessment/Plan:   GERD/dysphagia: -continue pantoprazole 40mg  BID before meals -EGD/ED with Dr. Mordechai April. ASA 2.  I have discussed the risks, alternatives, benefits with regards to but not limited to the risk of reaction to medication, bleeding, infection, perforation and the patient is agreeable to proceed. Written consent to be obtained.  Cecal ulcer: -noted at time of colonoscopy in 2023, pathology more c/w NSAID etiology -patient reports remote ulcers, unable to find records. She has had gastritis and peptic strictures. She is interested in NSAIDs for arthritis. Would advise to monitor for GI  symptoms. Use lowest effective dose. Stay on PPI BID for now.   Fatty liver: -last imaging via u/s 07/2021 -LFTs normal 04/2023 -NAFLD score -2.16 indicating no significant fibrosis Instructions for fatty liver: Recommend 1-2# weight loss per week until ideal body weight through exercise & diet. Low fat/cholesterol diet.   Avoid sweets, sodas, fruit juices, sweetened beverages like tea, etc. Gradually increase exercise from 15 min daily up to 1 hr per day 5 days/week. Limit alcohol use. -ov one year, consider baseline elastography at that time      Trudie Fuse. Harles Lied, MHS, PA-C [facility]

## 2024-01-08 NOTE — Patient Instructions (Signed)
 Upper endoscopy to be scheduled.   I will request copy of latest labs from PCP for review.

## 2024-01-08 NOTE — Progress Notes (Unsigned)
 GI Office Note    Referring Provider: Royann Shivers, * Primary Care Physician:  Sheela Stack  Primary Gastroenterologist: Hennie Duos. Marletta Lor, DO   Chief Complaint   Chief Complaint  Patient presents with   Dysphagia    Pt has had to have esophagus stretched before and now issue is coming back. Hand doctor wants to put her on Meloxicam or Celebrex.    History of Present Illness   Ruth Shah is a 48 y.o. female presenting today for dysphagia. H/o IBS, chronic right sided abd/rib pain, fatty liver (imaging in 2021, normal LFTS), GERD. Previously, negative celiac serologies. Bentyl caused constipation.Did not try [place].  Today: Solid food dysphagia, food sits in lower chest forever before it goes down. Feels esophageaus working to try it to get it go down, almost like contractions. Previously esophageal dilation really helped up until about 3 months ago. Recently seen by ENT for crampy pain in left neck. Worse with yawning and with food impactions. Affects voice. Has to wait for it to pass to talk. Lasting seconds at a time. Imaging planned in near future. States fiberoptic study by ENT was unremarkable.   Stools soft at times. BM 1-2 per day depending on what eats. Certain foods go straight through.   Going to see new rheumatologist in July, Dr. Corliss Skains. She has longstanding history of positive ANA, joint/muscle pain. Hurts constantly. 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 Orthopedist recommending meloxicam or celebrex which has helped her pain when she took a couple recently. She was concerned to start without GI approval due to prior history of remote ulcers and colonoscopy with cecal ulcer (thought to be NSAID related at that time but patient states she was taking ibuprofen rarely before colonoscopy).   Esophageal manometry 2019: -unremarkable, no significant motility issues  EGD  08/2019: -dysphagia due to benign appearing esophageal stricture in setting of uncontrolled GERD  Colonoscopy 07/2022: -non-bleeding internal hemorrhoids -single solitary ulcer in cecum s/p bx -ileum normal -random colon bx neg -cecal bx with ulcerated severe active colitis, non-specific and not characteristic for IBD, possibly NSAIDs  Medications   Current Outpatient Medications  Medication Sig Dispense Refill   albuterol (VENTOLIN HFA) 108 (90 Base) MCG/ACT inhaler Inhale 1-2 puffs into the lungs every 6 (six) hours as needed for wheezing or shortness of breath.     cetirizine (ZYRTEC) 10 MG tablet Take 10 mg by mouth daily.     lisinopril-hydrochlorothiazide (ZESTORETIC) 20-25 MG tablet Take 1 tablet by mouth in the morning.     ondansetron (ZOFRAN) 4 MG tablet Take 1 tablet (4 mg total) by mouth every 8 (eight) hours as needed for nausea or vomiting. 20 tablet 0   pantoprazole (PROTONIX) 40 MG tablet Take 40 mg by mouth 2 (two) times daily before a meal.     No current facility-administered medications for this visit.    Allergies   Allergies as of 01/08/2024 - Review Complete 01/08/2024  Allergen Reaction Noted   Dog epithelium (canis lupus familiaris) Swelling and Cough 06/26/2022   Morphine and codeine Itching 09/22/2021   Dilaudid [hydromorphone hcl] Nausea And Vomiting 10/10/2017   Duloxetine Other (See Comments) 06/13/2018   Sulfa antibiotics Nausea Only 07/20/2016   Toradol [ketorolac tromethamine] Nausea Only 07/20/2016   Vicodin [hydrocodone-acetaminophen] Itching 08/12/2015     Past Medical History   Past Medical History:  Diagnosis Date  ADD (attention deficit disorder)    Anxiety    Arthritis    Asthma    mild seasonal reactions to mold and mildew   Back pain    Depression    Family history of adverse reaction to anesthesia    mother has severe PONV   Fatty liver    Fibromyalgia    GERD (gastroesophageal reflux disease)    Herpes simplex disease     History of hiatal hernia    History of kidney stones    Hypertension    Migraines    Paresthesia of both hands    PONV (postoperative nausea and vomiting)    Ulcer, colon    Urticaria     Past Surgical History   Past Surgical History:  Procedure Laterality Date   BIOPSY  09/23/2019   Procedure: BIOPSY;  Surgeon: West Bali, MD;  Location: [facility];  Service: Endoscopy;;  esophagus   BIOPSY  07/31/2022   Procedure: BIOPSY;  Surgeon: Lanelle Bal, DO;  Location: [facility];  Service: Endoscopy;;   CHOLECYSTECTOMY N/A 04/08/2020   Procedure: LAPAROSCOPIC CHOLECYSTECTOMY;  Surgeon: Franky Macho, MD;  Location: [facility];  Service: General;  Laterality: N/A;   collapsed lung     after rotator cuff surgery   COLONOSCOPY WITH PROPOFOL N/A 07/31/2022   Procedure: COLONOSCOPY WITH PROPOFOL;  Surgeon: Lanelle Bal, DO;  Location: [facility];  Service: Endoscopy;  Laterality: N/A;  10:30 am   ENDOMETRIAL ABLATION     ESOPHAGEAL MANOMETRY N/A 10/15/2017   Procedure: ESOPHAGEAL MANOMETRY (EM);  Surgeon: Napoleon Form, MD;  Location: [facility];  Service: Endoscopy;  Laterality: N/A;   ESOPHAGOGASTRODUODENOSCOPY (EGD) WITH PROPOFOL N/A 12/26/2016   Dr. Darrick Penna: one benign-appearing, intrinsic stenosis that was widely patent and traversed. Small hiatal hernia, few small sessile polyps. Chronic gastritis. Normal duodenum, negative celiac sprue.    ESOPHAGOGASTRODUODENOSCOPY (EGD) WITH PROPOFOL N/A 09/23/2019   Surgeon: West Bali, MD;  1 benign-appearing esophageal stenosis s/p dilation, esophagus was biopsied, normal examined stomach and duodenum.  Pathology with mild reactive changes, no increased intraepithelial eosinophils.   HERNIA REPAIR     inguinal as a baby    left ovarian removal due to cyst     PILONIDAL CYST / SINUS EXCISION  2009   RECTOCELE REPAIR N/A 10/02/2017   Procedure: POSTERIOR REPAIR (RECTOCELE);  Surgeon: Tilda Burrow, MD;  Location:  [facility];  Service: Gynecology;  Laterality: N/A;   SAVORY DILATION N/A 09/23/2019   Procedure: SAVORY DILATION;  Surgeon: West Bali, MD;  [facility];  Service: Endoscopy;  Laterality: N/A;   SHOULDER ARTHROSCOPY WITH ROTATOR CUFF REPAIR Right 09/22/2021   Procedure: SHOULDER ARTHROSCOPY WITH ROTATOR CUFF REPAIR, BICEPS TENOTOMY, SUBACROMIAL DECOMPRESSION, DEBRIDEMENT;  Surgeon: Yolonda Kida, MD;  Location: [facility];  Service: Orthopedics;  Laterality: Right;  120   SINOSCOPY     SPINAL CORD STIMULATOR INSERTION N/A 09/20/2023   Procedure: SPINAL CORD STIMULATOR INSERTION;  Surgeon: Venita Lick, MD;  Location: [facility];  Service: Orthopedics;  Laterality: N/A;  3 C-Bed   TUBAL LIGATION     VAGINAL HYSTERECTOMY N/A 10/02/2017   Procedure: HYSTERECTOMY VAGINAL;  Surgeon: Tilda Burrow, MD;  Location: [facility];  Service: Gynecology;  Laterality: N/A;    Past Family History   Family History  Problem Relation Age of Onset   Cancer Mother        skin   Depression Mother  Hypertension Mother    Hyperlipidemia Mother    Diabetes Mother    Cancer Father        skin   Arthritis Father    Hypertension Father    Hyperlipidemia Father    Asthma Brother    Depression Maternal Grandmother    Hypertension Maternal Grandmother    Mental illness Maternal Grandmother    Cancer Maternal Grandfather        kidney   Hypertension Maternal Grandfather    Kidney disease Maternal Grandfather    Emphysema Paternal Grandmother    Alzheimer's disease Paternal Grandmother    Parkinson's disease Paternal Grandfather    Colon cancer Neg Hx    Colon polyps Neg Hx    Allergic rhinitis Neg Hx    Eczema Neg Hx    Urticaria Neg Hx     Past Social History   Social History   Socioeconomic History   Marital status: Married    Spouse name: Not on file   Number of children: 4   Years of education: Some College   Highest education level: Not on file  Occupational History   Not  on file  Tobacco Use   Smoking status: Never   Smokeless tobacco: Never  Vaping Use   Vaping status: Never Used  Substance and Sexual Activity   Alcohol use: Not Currently   Drug use: No   Sexual activity: Yes    Birth control/protection: Surgical    Comment: hyst  Other Topics Concern   Not on file  Social History Narrative   Lives at home with husband and children.   Right-handed.   4 cups caffeine per week.   Social Drivers of Corporate investment banker Strain: Not on file  Food Insecurity: Low Risk  (01/02/2024)   Received from [facility]   Hunger Vital Sign    Worried About Running Out of Food in the Last Year: Never true    Ran Out of Food in the Last Year: Never true  Transportation Needs: No Transportation Needs (01/02/2024)   Received from [facility]    In the past 12 months, has lack of reliable transportation kept you from medical appointments, meetings, work or from getting things needed for daily living? : No  Physical Activity: Not on file  Stress: Not on file  Social Connections: Not on file  Intimate Partner Violence: Not At Risk (09/20/2023)   Humiliation, Afraid, Rape, and Kick questionnaire    Fear of Current or Ex-Partner: No    Emotionally Abused: No    Physically Abused: No    Sexually Abused: No    Review of Systems   General: Negative for anorexia, weight loss, fever, chills, fatigue, weakness. ENT: Negative for hoarseness, nasal congestion. See hpi CV: Negative for chest pain, angina, palpitations, dyspnea on exertion, peripheral edema.  Respiratory: Negative for dyspnea at rest, dyspnea on exertion, cough, sputum, wheezing.  GI: See history of present illness. GU:  Negative for dysuria, hematuria, urinary incontinence, urinary frequency, nocturnal urination.  Endo: Negative for unusual weight change.     Physical Exam   BP 110/74 (BP Location: Left Arm, Patient Position: Sitting, Cuff Size: Large)   Pulse 79   Temp  98.2 F (36.8 C) (Temporal)   Ht 5\' 2"  (1.575 m)   Wt 195 lb 3.2 oz (88.5 kg)   BMI 35.70 kg/m    General: Well-nourished, well-developed in no acute distress.  Eyes: No icterus. Mouth: Oropharyngeal mucosa  moist and pink   Lungs: Clear to auscultation bilaterally.  Heart: Regular rate and rhythm, no murmurs rubs or gallops.  Abdomen: Bowel sounds are normal, nontender, nondistended, no hepatosplenomegaly or masses,  no abdominal bruits or hernia , no rebound or guarding.  Rectal: not performed  Extremities: No lower extremity edema. No clubbing or deformities. Neuro: Alert and oriented x 4   Skin: Warm and dry, no jaundice.   Psych: Alert and cooperative, normal mood and affect.  Labs   Lab Results  Component Value Date   WBC 10.2 09/10/2023   HGB 13.6 09/10/2023   HCT 41.5 09/10/2023   MCV 91.2 09/10/2023   PLT 315 09/10/2023   Lab Results  Component Value Date   NA 140 09/10/2023   CL 105 09/10/2023   K 3.9 09/10/2023   CO2 30 09/10/2023   BUN 14 09/10/2023   CREATININE 1.03 (H) 09/10/2023   GFRNONAA >60 09/10/2023   CALCIUM 9.3 09/10/2023   ALBUMIN 3.8 05/14/2023   GLUCOSE 86 09/10/2023   Lab Results  Component Value Date   ALT 18 05/14/2023   AST 18 05/14/2023   ALKPHOS 80 05/14/2023   BILITOT 0.9 05/14/2023     Imaging Studies   No results found.  Assessment/Plan:   GERD/dysphagia: -continue pantoprazole 40mg  BID before meals -EGD/ED with Dr. Mordechai April. ASA 2.  I have discussed the risks, alternatives, benefits with regards to but not limited to the risk of reaction to medication, bleeding, infection, perforation and the patient is agreeable to proceed. Written consent to be obtained.  Cecal ulcer: -noted at time of colonoscopy in 2023, pathology more c/w NSAID etiology -patient reports remote ulcers, unable to find records. She has had gastritis and peptic strictures. She is interested in NSAIDs for arthritis. Would advise to monitor for GI  symptoms. Use lowest effective dose. Stay on PPI BID for now.   Fatty liver: -last imaging via u/s 07/2021 -LFTs normal 04/2023 -NAFLD score -2.16 indicating no significant fibrosis Instructions for fatty liver: Recommend 1-2# weight loss per week until ideal body weight through exercise & diet. Low fat/cholesterol diet.   Avoid sweets, sodas, fruit juices, sweetened beverages like tea, etc. Gradually increase exercise from 15 min daily up to 1 hr per day 5 days/week. Limit alcohol use. -ov one year, consider baseline elastography at that time      Trudie Fuse. Harles Lied, MHS, PA-C [facility]

## 2024-01-09 ENCOUNTER — Encounter: Payer: Self-pay | Admitting: *Deleted

## 2024-01-09 ENCOUNTER — Other Ambulatory Visit: Payer: Self-pay | Admitting: *Deleted

## 2024-01-09 DIAGNOSIS — R1319 Other dysphagia: Secondary | ICD-10-CM

## 2024-02-01 ENCOUNTER — Ambulatory Visit (HOSPITAL_COMMUNITY)
Admission: RE | Admit: 2024-02-01 | Discharge: 2024-02-01 | Disposition: A | Discharge status: Home / Self Care | Attending: Internal Medicine | Admitting: Internal Medicine

## 2024-02-01 ENCOUNTER — Encounter (HOSPITAL_COMMUNITY)
Admission: RE | Disposition: A | Discharge status: Home / Self Care | Payer: Self-pay | Source: Home / Self Care | Attending: Internal Medicine

## 2024-02-01 ENCOUNTER — Other Ambulatory Visit: Payer: Self-pay

## 2024-02-01 ENCOUNTER — Ambulatory Visit (HOSPITAL_COMMUNITY): Payer: Self-pay | Admitting: Certified Registered Nurse Anesthetist

## 2024-02-01 ENCOUNTER — Encounter (HOSPITAL_COMMUNITY): Payer: Self-pay | Admitting: Internal Medicine

## 2024-02-01 DIAGNOSIS — R1310 Dysphagia, unspecified: Secondary | ICD-10-CM

## 2024-02-01 DIAGNOSIS — F32A Depression, unspecified: Secondary | ICD-10-CM | POA: Diagnosis not present

## 2024-02-01 DIAGNOSIS — K589 Irritable bowel syndrome without diarrhea: Secondary | ICD-10-CM | POA: Diagnosis not present

## 2024-02-01 DIAGNOSIS — K760 Fatty (change of) liver, not elsewhere classified: Secondary | ICD-10-CM | POA: Insufficient documentation

## 2024-02-01 DIAGNOSIS — M1990 Unspecified osteoarthritis, unspecified site: Secondary | ICD-10-CM | POA: Diagnosis not present

## 2024-02-01 DIAGNOSIS — F419 Anxiety disorder, unspecified: Secondary | ICD-10-CM | POA: Diagnosis not present

## 2024-02-01 DIAGNOSIS — K449 Diaphragmatic hernia without obstruction or gangrene: Secondary | ICD-10-CM

## 2024-02-01 DIAGNOSIS — K317 Polyp of stomach and duodenum: Secondary | ICD-10-CM | POA: Insufficient documentation

## 2024-02-01 DIAGNOSIS — K219 Gastro-esophageal reflux disease without esophagitis: Secondary | ICD-10-CM | POA: Insufficient documentation

## 2024-02-01 DIAGNOSIS — K648 Other hemorrhoids: Secondary | ICD-10-CM | POA: Insufficient documentation

## 2024-02-01 DIAGNOSIS — I10 Essential (primary) hypertension: Secondary | ICD-10-CM | POA: Diagnosis not present

## 2024-02-01 DIAGNOSIS — J45909 Unspecified asthma, uncomplicated: Secondary | ICD-10-CM | POA: Insufficient documentation

## 2024-02-01 DIAGNOSIS — K222 Esophageal obstruction: Secondary | ICD-10-CM | POA: Diagnosis not present

## 2024-02-01 SURGERY — EGD (ESOPHAGOGASTRODUODENOSCOPY)
Anesthesia: General

## 2024-02-01 MED ORDER — LACTATED RINGERS IV SOLN: INTRAVENOUS | Status: DC | PRN

## 2024-02-01 MED ORDER — LIDOCAINE 2% (20 MG/ML) 5 ML SYRINGE
INTRAMUSCULAR | Status: DC | PRN
  Administered 2024-02-01: 100 mg via INTRAVENOUS

## 2024-02-01 MED ORDER — PROPOFOL 500 MG/50ML IV EMUL
INTRAVENOUS | Status: DC | PRN
  Administered 2024-02-01 (×2): 50 mg via INTRAVENOUS
  Administered 2024-02-01: 200 ug/kg/min via INTRAVENOUS
  Administered 2024-02-01: 80 mg via INTRAVENOUS

## 2024-02-01 MED ORDER — PROPOFOL 500 MG/50ML IV EMUL
INTRAVENOUS | Status: AC
  Filled 2024-02-01: qty 50

## 2024-02-01 NOTE — Discharge Instructions (Addendum)
 EGD Discharge instructions Please read the instructions outlined below and refer to this sheet in the next few weeks. These discharge instructions provide you with general information on caring for yourself after you leave the hospital. Your doctor may also give you specific instructions. While your treatment has been planned according to the most current medical practices available, unavoidable complications occasionally occur. If you have any problems or questions after discharge, please call your doctor. ACTIVITY You may resume your regular activity but move at a slower pace for the next 24 hours.  Take frequent rest periods for the next 24 hours.  Walking will help expel (get rid of) the air and reduce the bloated feeling in your abdomen.  No driving for 24 hours (because of the anesthesia (medicine) used during the test).  You may shower.  Do not sign any important legal documents or operate any machinery for 24 hours (because of the anesthesia used during the test).  NUTRITION Drink plenty of fluids.  You may resume your normal diet.  Begin with a light meal and progress to your normal diet.  Avoid alcoholic beverages for 24 hours or as instructed by your caregiver.  MEDICATIONS You may resume your normal medications unless your caregiver tells you otherwise.  WHAT YOU CAN EXPECT TODAY You may experience abdominal discomfort such as a feeling of fullness or "gas" pains.  FOLLOW-UP Your doctor will discuss the results of your test with you.  SEEK IMMEDIATE MEDICAL ATTENTION IF ANY OF THE FOLLOWING OCCUR: Excessive nausea (feeling sick to your stomach) and/or vomiting.  Severe abdominal pain and distention (swelling).  Trouble swallowing.  Temperature over 101 F (37.8 C).  Rectal bleeding or vomiting of blood.    You have a hiatal hernia as well as 2 Schatzki's rings which are benign fibrous rings related to chronic acid reflux.  I stretched these out today.  Hopefully this helps  with your swallowing.  We may need to dilate further in the near future.  Continue on pantoprazole  twice daily.  You also had multiple small polyps in your stomach, these are likely benign fundic gland polyps.  I did remove a few today we will call with these results.  Small bowel was normal.  Follow-up in 2 to 3 months.  I hope you have a great rest of your week!  Rolando Cliche. Mordechai April, D.O. Gastroenterology and Hepatology [facility]

## 2024-02-01 NOTE — Anesthesia Preprocedure Evaluation (Signed)
 Anesthesia Evaluation  Patient identified by MRN, date of birth, ID band Patient awake    Reviewed: Allergy & Precautions, H&P , NPO status , Patient's Chart, lab work & pertinent test results, reviewed documented beta blocker date and time   History of Anesthesia Complications (+) PONV, Family history of anesthesia reaction and history of anesthetic complications  Airway Mallampati: II  TM Distance: >3 FB Neck ROM: full    Dental no notable dental hx.    Pulmonary asthma    Pulmonary exam normal breath sounds clear to auscultation       Cardiovascular Exercise Tolerance: Good hypertension,  Rhythm:regular Rate:Normal     Neuro/Psych  Headaches PSYCHIATRIC DISORDERS Anxiety Depression     Neuromuscular disease    GI/Hepatic Neg liver ROS, hiatal hernia, PUD,GERD  ,,  Endo/Other  negative endocrine ROS    Renal/GU negative Renal ROS  negative genitourinary   Musculoskeletal   Abdominal   Peds  Hematology negative hematology ROS (+)   Anesthesia Other Findings   Reproductive/Obstetrics negative OB ROS                             Anesthesia Physical Anesthesia Plan  ASA: 2  Anesthesia Plan: General   Post-op Pain Management:    Induction:   PONV Risk Score and Plan: Propofol  infusion  Airway Management Planned:   Additional Equipment:   Intra-op Plan:   Post-operative Plan:   Informed Consent: I have reviewed the patients History and Physical, chart, labs and discussed the procedure including the risks, benefits and alternatives for the proposed anesthesia with the patient or authorized representative who has indicated his/her understanding and acceptance.     Dental Advisory Given  Plan Discussed with: CRNA  Anesthesia Plan Comments:        Anesthesia Quick Evaluation

## 2024-02-01 NOTE — Transfer of Care (Signed)
 Immediate Anesthesia Transfer of Care Note  Patient: Ruth Shah  Procedure(s) Performed: EGD (ESOPHAGOGASTRODUODENOSCOPY) DILATION, ESOPHAGUS  Patient Location: Endo  Anesthesia Type:General  Level of Consciousness: awake, alert , and oriented  Airway & Oxygen Therapy: Patient Spontanous Breathing  Post-op Assessment: Report given to RN, Post -op Vital signs reviewed and stable, Patient moving all extremities X 4, and Patient able to stick tongue midline  Post vital signs: Reviewed and stable  Last Vitals:  Vitals Value Taken Time  BP 140/79   Temp 97.4   Pulse 87   Resp 18   SpO2 94     Last Pain:  Vitals:   02/01/24 1018  TempSrc:   PainSc: 3       Patients Stated Pain Goal: 4 (02/01/24 0858)  Complications: No notable events documented.

## 2024-02-01 NOTE — Anesthesia Postprocedure Evaluation (Signed)
 Anesthesia Post Note  Patient: Ruth Shah  Procedure(s) Performed: EGD (ESOPHAGOGASTRODUODENOSCOPY) DILATION, ESOPHAGUS  Patient location during evaluation: Phase II Anesthesia Type: General Level of consciousness: awake Pain management: pain level controlled Vital Signs Assessment: post-procedure vital signs reviewed and stable Respiratory status: spontaneous breathing and respiratory function stable Cardiovascular status: blood pressure returned to baseline and stable Postop Assessment: no headache and no apparent nausea or vomiting Anesthetic complications: no Comments: Late entry   No notable events documented.   Last Vitals:  Vitals:   02/01/24 0858 02/01/24 1032  BP: (!) 157/97 (!) 140/79  Pulse: 74 82  Resp: 17 19  Temp: 37 C (!) 36.3 C  SpO2: 97% 97%    Last Pain:  Vitals:   02/01/24 1035  TempSrc:   PainSc: 0-No pain                 Coretha Dew

## 2024-02-01 NOTE — Interval H&P Note (Signed)
 History and Physical Interval Note:  02/01/2024 9:47 AM  Ruth Shah  has presented today for surgery, with the diagnosis of dysphagia.  The various methods of treatment have been discussed with the patient and family. After consideration of risks, benefits and other options for treatment, the patient has consented to  Procedure(s) with comments: EGD (ESOPHAGOGASTRODUODENOSCOPY) (N/A) - 10:30 am, asa 2 DILATION, ESOPHAGUS (N/A) as a surgical intervention.  The patient's history has been reviewed, patient examined, no change in status, stable for surgery.  I have reviewed the patient's chart and labs.  Questions were answered to the patient's satisfaction.     Vinetta Greening

## 2024-02-01 NOTE — Op Note (Signed)
 [facility] Patient Name: Ruth Shah Procedure Date: 02/01/2024 10:07 AM MRN: [id number] Date of Birth: 1976/05/17 Attending MD: Rolando Cliche. Mordechai April , [place], [id number] CSN: [id number] Age: 48 Admit Type: Outpatient Procedure:                Upper GI endoscopy Indications:              Dysphagia Providers:                Rolando Cliche. Mordechai April, DO, Laurier Poplin RN, RN,                            [place] Wilson, Technician, Sharlette Dayhoff Technician,                            Pensions consultant Referring MD:              Medicines:                See the Anesthesia note for documentation of the                            administered medications Complications:            No immediate complications. Estimated Blood Loss:     Estimated blood loss was minimal. Procedure:                Pre-Anesthesia Assessment:                           - The anesthesia plan was to use monitored                            anesthesia care (MAC).                           After obtaining informed consent, the endoscope was                            passed under direct vision. Throughout the                            procedure, the patient's blood pressure, pulse, and                            oxygen saturations were monitored continuously. The                            GIF-H190 ([id number]) scope was introduced through the                            mouth, and advanced to the second part of duodenum.                            The upper GI endoscopy was accomplished without                            difficulty. The patient tolerated the procedure  well. Scope In: 10:20:38 AM Scope Out: 10:28:21 AM Total Procedure Duration: 0 hours 7 minutes 43 seconds  Findings:      A 3 cm hiatal hernia was present.      A mild Schatzki ring x2 was found in the distal esophagus. A TTS dilator       was passed through the scope. Dilation with a 15-16.5-18 mm balloon       dilator was performed to 18  mm. The dilation site was examined and       showed mild mucosal disruption and moderate improvement in luminal       narrowing. Ring was then further disrupted with cold forceps.      Multiple small sessile likely fundic gland polyps (all <1 cm) with no       bleeding and no stigmata of recent bleeding were found in the gastric       body. Two of larger polyps (though all still <1cm) were removed with a       cold snare. Resection and retrieval were complete.      The duodenal bulb, first portion of the duodenum and second portion of       the duodenum were normal. Impression:               - 3 cm hiatal hernia.                           - Mild Schatzki ring. Dilated.                           - Normal duodenal bulb, first portion of the                            duodenum and second portion of the duodenum.                           - No specimens collected. Moderate Sedation:      Per Anesthesia Care Recommendation:           - Patient has a contact number available for                            emergencies. The signs and symptoms of potential                            delayed complications were discussed with the                            patient. Return to normal activities tomorrow.                            Written discharge instructions were provided to the                            patient.                           - Resume previous diet.                           -  Continue present medications.                           - Await pathology results.                           - Repeat upper endoscopy PRN for retreatment.                           - Return to GI clinic in 3 months. Procedure Code(s):        --- Professional ---                           [phone], Esophagogastroduodenoscopy, flexible,                            transoral; with transendoscopic balloon dilation of                            esophagus (less than 30 mm diameter) Diagnosis Code(s):        ---  Professional ---                           K44.9, Diaphragmatic hernia without obstruction or                            gangrene                           K22.2, Esophageal obstruction                           R13.10, Dysphagia, unspecified CPT copyright 2022 American Medical Association. All rights reserved. The codes documented in this report are preliminary and upon coder review may  be revised to meet current compliance requirements. Rolando Cliche. Mordechai April, DO Rolando Cliche. Naya Ilagan, DO 02/01/2024 10:40:19 AM This report has been signed electronically. Number of Addenda: 0

## 2024-02-04 ENCOUNTER — Encounter (HOSPITAL_COMMUNITY): Payer: Self-pay | Admitting: Internal Medicine

## 2024-02-04 LAB — SURGICAL PATHOLOGY

## 2024-02-14 ENCOUNTER — Ambulatory Visit: Payer: Self-pay | Admitting: Internal Medicine

## 2024-03-31 NOTE — Progress Notes (Signed)
 Office Visit Note  Patient: Ruth Shah             Date of Birth: [birth year]           MRN: [id number]             PCP: Jeanette Comer FORBES DEVONNA Referring: Burnetta Aures, MD Visit Date: 04/14/2024 Occupation: @GUAROCC @  Subjective:  Pain in multiple joints  History of Present Illness: Ruth Shah is a 48 y.o. female seen for the evaluation of polyarthralgia.  Patient states that her symptoms started more than 20 years ago with lower back pain.  She states she was initially evaluated by her PCP and then orthopedic surgeon.  She was initially was under the care of Dr. Barbarann and had injections with help temporarily.  For the last 2 years she has been seeing Dr. Burnetta.  She has had a spinal stimulation which helped with the muscle pain.  She has also had nerve block.  She continues to have lower back pain, neck and thoracic pain.  She states for the last 20 years she has been having discomfort in her hands and feet.  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  She was once diagnosed with carpal tunnel syndrome.  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  She has not had surgery but she has been getting injections every few months.  She also wears CMC brace.  She has been experiencing generalized pain and discomfort in multiple joints over the years.  She has been evaluated by [facility] rheumatology several years ago and was diagnosed with fibromyalgia syndrome.  She went for a follow-up visit over there and saw another rheumatologist who also diagnosed her with fibromyalgia syndrome.  No treatment was offered.    Patient was evaluated by Dr. Straznac in November 2022.  At the time she was given the diagnosis of fibromyalgia.  She also had positive ANA and vitamin D  deficiency.  She was at [facility] for a third opinion.  She was advised pain management and Cymbalta.  Patient states she tried Cymbalta and Lyrica but could not tolerate both medications.  She continues to have pain and discomfort in her neck, upper and lower back.  She complains of discomfort in her elbows, wrist, hands, hips, knees, ankles and her feet.  She notices swelling in her hands, and ankles.  There is no personal history of psoriasis.  She has a son with psoriasis.  She also has a daughter who was diagnosed with scleritis.  There is history of autoimmune disease in her maternal aunt.  She is married, right-handed, gravida 4, para 5.  She had history of preeclampsia.  No history of DVTs.  She is status post hysterectomy and tubal ligation.  She works as a Buyer, retail.  She also breeds great Dane.  She was accompanied by her husband today.  She is non-smoker.  She smoked for 2 years many years ago and then had secondhand smoking for 15 years.  She has had no exposure to smoking in the last 17 years.  She does not drink any alcohol.  Activities of Daily Living:  Patient reports morning stiffness for 1 hour.   Patient Reports nocturnal pain.  Difficulty dressing/grooming: Denies Difficulty climbing stairs:  Denies Difficulty getting out of chair: Reports Difficulty using hands for taps, buttons, cutlery, and/or writing: Reports  Review of Systems  Constitutional:  Positive for fatigue.  HENT:  Positive for mouth sores and mouth dryness.   Eyes:  Positive for dryness.  Respiratory:  Negative for difficulty breathing.   Cardiovascular:  Negative for palpitations.  Gastrointestinal:  Positive for diarrhea. Negative for blood in stool and constipation.  Endocrine: Negative for increased urination.  Genitourinary:  Positive for involuntary urination.  Musculoskeletal:  Positive for joint pain, joint pain, joint swelling, myalgias, muscle weakness, morning stiffness and myalgias. Negative for gait  problem and muscle tenderness.  Skin:  Negative for color change, rash, hair loss and sensitivity to sunlight.  Allergic/Immunologic: Positive for susceptible to infections.  Neurological:  Positive for dizziness and headaches.  Hematological:  Negative for swollen glands.  Psychiatric/Behavioral:  Positive for depressed mood and sleep disturbance. The patient is nervous/anxious.     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   Fatty liver 09/30/2020   IBS (irritable bowel syndrome) 09/30/2020   Chronic cholecystitis    Radiculopathy due to lumbar intervertebral disc disorder 10/16/2019   Rectal leakage 06/17/2019   Abdominal pain 09/03/2018   Rectal pain 09/03/2018   Mild intermittent asthma, uncomplicated 04/24/2018   Adverse food reaction 04/24/2018   Perennial allergic rhinitis 04/24/2018   Allergic urticaria 04/24/2018   Gastroesophageal reflux disease    Hiatal hernia    Hematometra 10/02/2017   Pelvic pain in female 10/02/2017   Status post vaginal hysterectomy 10/02/2017   Rectal bleeding 07/26/2017   Rectocele, female 06/28/2017   Uterine prolapse 06/28/2017   Diarrhea 04/26/2017   Chronic bilateral low back pain without sciatica 02/15/2017   Dyspepsia 12/21/2016   Dysphagia 12/21/2016   Nausea without vomiting 12/21/2016   Chronic migraine 07/20/2016   Neck pain 07/20/2016   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   H/O Genital warts 08/12/2015    Past Medical History:  Diagnosis Date   ADD (attention deficit disorder)    ANA positive    Anxiety    Arthritis    Asthma    mild seasonal reactions to mold and mildew   Back pain    Depression    Family history of adverse reaction to anesthesia    mother has severe PONV   Fatty liver    Fibromyalgia    GERD (gastroesophageal reflux disease)    Herpes simplex disease    History of hiatal hernia    History of  kidney stones    Hypertension    IBS (irritable bowel syndrome) 2005   Migraines    Paresthesia of both hands    PONV (postoperative nausea and vomiting)    Ulcer, colon    Urticaria     Family History  Problem Relation Age of Onset   Cancer Mother        skin   Depression Mother    Hypertension Mother    Hyperlipidemia Mother    Diabetes Mother    Cancer Father        skin   Arthritis Father    Hypertension Father    Hyperlipidemia Father    Asthma Brother    Depression Maternal Grandmother    Hypertension Maternal Grandmother    Mental illness Maternal Grandmother    Cancer Maternal Grandfather  kidney   Hypertension Maternal Grandfather    Kidney disease Maternal Grandfather    Emphysema Paternal Grandmother    Alzheimer's disease Paternal Grandmother    Parkinson's disease Paternal Grandfather    Healthy Daughter    Autoimmune disease Daughter    Scleritis Daughter    Depression Son    Asthma Son    Anxiety disorder Son    Healthy Son    Colon cancer Neg Hx    Colon polyps Neg Hx    Allergic rhinitis Neg Hx    Eczema Neg Hx    Urticaria Neg Hx    Past Surgical History:  Procedure Laterality Date   BIOPSY  09/23/2019   Procedure: BIOPSY;  Surgeon: Harvey Margo CROME, MD;  [facility];  Service: Endoscopy;;  esophagus   BIOPSY  07/31/2022   Procedure: BIOPSY;  Surgeon: Cindie Carlin POUR, DO;  Location: [facility];  Service: Endoscopy;;   CHOLECYSTECTOMY N/A 04/08/2020   Procedure: LAPAROSCOPIC CHOLECYSTECTOMY;  Surgeon: Mavis Anes, MD;  Location: [facility];  Service: General;  Laterality: N/A;   collapsed lung     after rotator cuff surgery   COLONOSCOPY WITH PROPOFOL  N/A 07/31/2022   Procedure: COLONOSCOPY WITH PROPOFOL ;  Surgeon: Cindie Carlin POUR, DO;  [facility]: [facility];  Service: Endoscopy;  Laterality: N/A;  10:30 am   ENDOMETRIAL ABLATION     ESOPHAGEAL DILATION N/A 02/01/2024   Procedure: DILATION, ESOPHAGUS;  Surgeon: Cindie Carlin POUR, DO;  [facility]: [facility];  Service: Endoscopy;  Laterality: N/A;   ESOPHAGEAL MANOMETRY N/A 10/15/2017   Procedure: ESOPHAGEAL MANOMETRY (EM);  Surgeon: Shila Gustav GAILS, MD;  Location: [facility];  Service: Endoscopy;  Laterality: N/A;   ESOPHAGOGASTRODUODENOSCOPY N/A 02/01/2024   Procedure: EGD (ESOPHAGOGASTRODUODENOSCOPY);  Surgeon: Cindie Carlin POUR, DO;  [facility]: [facility];  Service: Endoscopy;  Laterality: N/A;  10:30 am, asa 2   ESOPHAGOGASTRODUODENOSCOPY (EGD) WITH PROPOFOL  N/A 12/26/2016   Dr. Harvey: one benign-appearing, intrinsic stenosis that was widely patent and traversed. Small hiatal hernia, few small sessile polyps. Chronic gastritis. Normal duodenum, negative celiac sprue.    ESOPHAGOGASTRODUODENOSCOPY (EGD) WITH PROPOFOL  N/A 09/23/2019   Surgeon: Harvey Margo CROME, MD;  1 benign-appearing esophageal stenosis s/p dilation, esophagus was biopsied, normal examined stomach and duodenum.  Pathology with mild reactive changes, no increased intraepithelial eosinophils.   HERNIA REPAIR     inguinal as a baby    left ovarian removal due to cyst     PILONIDAL CYST / SINUS EXCISION  2009   RECTOCELE REPAIR N/A 10/02/2017   Procedure: POSTERIOR REPAIR (RECTOCELE);  Surgeon: Edsel Norleen GAILS, MD;  Location: [facility];  Service: Gynecology;  Laterality: N/A;   SAVORY DILATION N/A 09/23/2019   Procedure: SAVORY DILATION;  Surgeon: Harvey Margo CROME, MD;  [facility];  Service: Endoscopy;  Laterality: N/A;   SHOULDER ARTHROSCOPY WITH ROTATOR CUFF REPAIR Right 09/22/2021   Procedure: SHOULDER ARTHROSCOPY WITH ROTATOR CUFF REPAIR, BICEPS TENOTOMY, SUBACROMIAL DECOMPRESSION, DEBRIDEMENT;  Surgeon: Sharl Selinda Dover, MD;  Location[facility];  Service: Orthopedics;  Laterality: Right;  120   SINOSCOPY     SPINAL CORD STIMULATOR INSERTION N/A 09/20/2023   Procedure: SPINAL CORD STIMULATOR INSERTION;  Surgeon: Burnetta Aures, MD;  Location: [facility];  Service: Orthopedics;   Laterality: N/A;  3 C-Bed   TUBAL LIGATION     VAGINAL HYSTERECTOMY N/A 10/02/2017   Procedure: HYSTERECTOMY VAGINAL;  Surgeon: Edsel Norleen GAILS, MD;  Location: [facility];  Service: Gynecology;  Laterality: N/A;   Social History   Social History Narrative   Lives at home with husband and children.   Right-handed.   4 cups caffeine per week.    There is no immunization history on file for this patient.   Objective: Vital Signs: BP (!) 143/91 (BP Location: Right Arm, Patient Position: Sitting, Cuff Size: Normal)   Pulse 61   Resp 16   Ht 5' 3.5 (1.613 m)   Wt 195 lb (88.5 kg)   BMI 34.00 kg/m    Physical Exam Vitals and nursing note reviewed.  Constitutional:      Appearance: She is well-developed.  HENT:     Head: Normocephalic and atraumatic.  Eyes:     Conjunctiva/sclera: Conjunctivae normal.  Cardiovascular:     Rate and Rhythm: Normal rate and regular rhythm.     Heart sounds: Normal heart sounds.  Pulmonary:     Effort: Pulmonary effort is normal.     Breath sounds: Normal breath sounds.  Abdominal:     General: Bowel sounds are normal.     Palpations: Abdomen is soft.  Musculoskeletal:     Cervical back: Normal range of motion.  Lymphadenopathy:     Cervical: No cervical adenopathy.  Skin:    General: Skin is warm and dry.     Capillary Refill: Capillary refill takes less than 2 seconds.  Neurological:     Mental Status: She is alert and oriented to person, place, and time.  Psychiatric:        Behavior: Behavior normal.      Musculoskeletal Exam: Cervical, thoracic and lumbar spine but good range of motion.  She had discomfort range of motion of the cervical and lumbar spine.  Shoulder joints, elbow joints, wrist joints, MCPs PIPs and DIPs with good range of motion with no synovitis.  Bilateral CMC, PIP and DIP thickening was noted.  She had discomfort range of motion of her right hip joint.  She discomfort range of motion of her knee joints without any  warmth swelling or effusion.  There was tenderness across her MTPs but no synovitis was noted.  CDAI Exam: CDAI Score: -- Patient Global: --; Provider Global: -- Swollen: --; Tender: -- Joint Exam 04/14/2024   No joint exam has been documented for this visit   There is currently no information documented on the homunculus. Go to the Rheumatology activity and complete the homunculus joint exam.  Investigation: No additional findings.  Imaging: XR Foot 2 Views Left Result Date: 04/14/2024 PIP and DIP narrowing was noted.  No MTP, intertarsal, tibiotalar or subtalar joint space narrowing was noted.  No erosive changes were noted. Impression: These findings suggestive of osteoarthritis of the foot.  XR Foot 2 Views Right Result Date: 04/14/2024 PIP and DIP narrowing was noted.  No MTP, intertarsal, tibiotalar or subtalar joint space narrowing was noted.  No erosive changes were noted. Impression: These findings suggestive of osteoarthritis of the foot.  XR HIP UNILAT W OR W/O PELVIS 2-3 VIEWS RIGHT Result Date: 04/14/2024 No SI joint narrowing or sclerosis was noted.  No hip joint narrowing or sclerosis was noted.  No chondrocalcinosis noted. Impression: Unremarkable x-rays of the hip.  XR KNEE 3 VIEW LEFT Result Date: 04/14/2024 No medial or lateral compartment narrowing was noted.  No patellofemoral narrowing was noted.  No chondrocalcinosis was noted. Impression: Unremarkable x-rays of the knee.  XR KNEE 3 VIEW RIGHT Result Date: 04/14/2024 No medial or lateral compartment narrowing  was noted.  No patellofemoral narrowing was noted.  No chondrocalcinosis was noted. Impression: Unremarkable x-rays of the knee.  XR Hand 2 View Left Result Date: 04/14/2024 Severe CMC narrowing was noted.  PIP and DIP narrowing was noted.  No MCP, intercarpal or radiocarpal joint space narrowing was noted.  No erosive changes were noted. Impression: These findings are suggestive of osteoarthritis of the  hand.  XR Hand 2 View Right Result Date: 04/14/2024 CMC, PIP and DIP narrowing was noted.  No MCP, intercarpal or radiocarpal joint space narrowing was noted.  No erosive changes were noted. Impression: These findings are suggestive of osteoarthritis of the hand.   Recent Labs: Lab Results  Component Value Date   WBC 10.2 09/10/2023   HGB 13.6 09/10/2023   PLT 315 09/10/2023   NA 140 09/10/2023   K 3.9 09/10/2023   CL 105 09/10/2023   CO2 30 09/10/2023   GLUCOSE 86 09/10/2023   BUN 14 09/10/2023   CREATININE 1.03 (H) 09/10/2023   BILITOT 0.9 05/14/2023   ALKPHOS 80 05/14/2023   AST 18 05/14/2023   ALT 18 05/14/2023   PROT 7.0 05/14/2023   ALBUMIN 3.8 05/14/2023   CALCIUM 9.3 09/10/2023   GFRAA >60 02/04/2020   March 22, 2023 MRI of the cervical spine at [facility] moderate right and mild left C6-C7 neuroforaminal narrowing.  Mild spinal canal stenosis with minimal deformity of ventral cord.  Mild right C5-C6 neuroforaminal narrowing.  Minimal deformity of central left central ventral cord without spinal canal stenosis.  Mild reversal of normal cervical lordosis.  October 27, 2022 MRI thoracic no evidence of disc herniation, significant bulging disc, or narrowing in the thoracic spine.  Partially visualized mild spinal canal stenosis at C6-C7 and possibly mild bilateral neuroforaminal narrowing.  September 10, 2023 CBC WBC 10.2, hemoglobin 13.5, platelets 315  August 27, 2018 RF negative  Speciality Comments: No specialty comments available.  Procedures:  No procedures performed Allergies: Dog epithelium (canis lupus familiaris), Morphine and codeine, Dilaudid  [hydromorphone  hcl], Duloxetine, Sulfa antibiotics, Toradol [ketorolac tromethamine], and Vicodin [hydrocodone-acetaminophen ]   Assessment / Plan:     Visit Diagnoses: Polyarthralgia -patient complains of pain and discomfort in multiple joints over the last 20 years.  She had been seen by 2 previous rheumatologist at  [facility] and also at [facility] rheumatology.  She was diagnosed with fibromyalgia syndrome.  She states she tried Cymbalta and Lyrica without much help.  She continues to have pain and discomfort in her joints and intermittent swelling.  No synovitis noted on the examination today.  I will obtain following labs.  Plan: Sedimentation rate, Uric acid, Rheumatoid factor, Cyclic citrul peptide antibody, IgG  Pain in both hands -she gives history of pain and discomfort in the bilateral hands.  She has difficulty making a fist at times.  Bilateral CMC, PIP and DIP thickening was noted.  Patient states she has been followed at hand center on [facility].  She gets [facility] joint injections every few months.  She was also advised to get [facility] replacement.  Plan: XR Hand 2 View Right, XR Hand 2 View Left.  X-rays of bilateral hands with history of osteoarthritis.  Bilateral CMC narrowing was noted.  Pain in right hip -she complains of discomfort in her right hip joint.  She had painful range of motion of the right hip joint.  Plan: XR HIP UNILAT W OR W/O PELVIS 2-3 VIEWS RIGHT.  X-rays were unremarkable.  Chronic pain of both knees -she  complains of discomfort in the bilateral knee joints.  She had pain on squatting position.  No warmth swelling or effusion was noted.  Plan: XR KNEE 3 VIEW RIGHT, XR KNEE 3 VIEW LEFT.  X-rays were unremarkable.  Pain in both feet -she complains of discomfort in her bilateral feet.  No synovitis was noted.  Plan: XR Foot 2 Views Right, XR Foot 2 Views Left.  X-ray with history of osteoarthritis.  DDD (degenerative disc disease), cervical-she is followed by Dr. Burnetta.  I reviewed MRI of the cervical spine from July 2024 which showed mild spinal stenosis and multilevel spondylosis and.  Pain in thoracic spine - Followed by Dr. Burnetta.  MRI of the thoracic spine from February 2024 was reviewed as mentioned above.  Degeneration of intervertebral disc of lumbar region  without discogenic back pain or lower extremity pain-she continues to have lower back pain and followed by Dr. Burnetta.  She states she has spinal stimulation unit placed which helped with the muscle spasm.  She also had nerve block injections.  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  She states is strenuous on her back.  Positive ANA (antinuclear antibody) -she had positive ANA before.  There is also history of preeclampsia.  She gives history of sicca symptoms, fatigue and arthralgias.  There is no history of Raynaud's, photosensitivity or lymphadenopathy.  Plan: ANA, Sjogrens syndrome-A extractable nuclear antibody, Sjogrens syndrome-B extractable nuclear antibody, Anti-Smith antibody, RNP Antibody, Anti-scleroderma antibody, Anti-DNA antibody, double-stranded, C3 and C4, Beta-2  glycoprotein antibodies, Cardiolipin antibodies, IgG, IgM, IgA, Glucose 6 phosphate dehydrogenase, Protein / creatinine ratio, urine  Other fatigue -she gives history of fatigue for many years.  Plan: CBC with Differential/Platelet, Comprehensive metabolic panel with GFR, Serum protein electrophoresis with reflex  Other insomnia-she has chronic insomnia.  Fibromyalgia -she was diagnosed with fibromyalgia syndrome by 3 different rheumatologist in the past.  She continues to have generalized pain and discomfort.  She had few tender points.  Patient states she has tried Cymbalta and Lyrica and had intolerance to both medications.  Plan: CK  Chronic pain syndrome-she is suffers from chronic discomfort.  Other medical problems are listed as follows:  Dyslipidemia  Fatty liver  Gastroesophageal reflux disease without esophagitis  Hiatal hernia  Other dysphagia  Perennial allergic rhinitis  Mild intermittent asthma, uncomplicated  Irritable bowel syndrome with diarrhea  Anxiety  Other specified attention deficit hyperactivity disorder (ADHD)  Family history of psoriasis-son - Her daughter has  scleritis..  She states her maternal aunt has autoimmune disease.  Orders: Orders Placed This Encounter  Procedures   XR Hand 2 View Right   XR Hand 2 View Left   XR KNEE 3 VIEW RIGHT   XR KNEE 3 VIEW LEFT   XR Foot 2 Views Right   XR Foot 2 Views Left   XR HIP UNILAT W OR W/O PELVIS 2-3 VIEWS RIGHT   CBC with Differential/Platelet   Comprehensive metabolic panel with GFR   Sedimentation rate   CK   Uric acid   Rheumatoid factor   Cyclic citrul peptide antibody, IgG   ANA   Sjogrens syndrome-A extractable nuclear antibody   Sjogrens syndrome-B extractable nuclear antibody   Anti-Smith antibody   RNP Antibody   Anti-scleroderma antibody   Anti-DNA antibody, double-stranded   C3 and C4   Beta-2  glycoprotein antibodies   Cardiolipin antibodies, IgG, IgM, IgA   Glucose 6 phosphate dehydrogenase   Serum protein electrophoresis with reflex   Protein /  creatinine ratio, urine   No orders of the defined types were placed in this encounter.   Face-to-face time spent with patient was over 60 minutes. Greater than 50% of time was spent in counseling and coordination of care.  Follow-Up Instructions: Return for Polyarthralgia, positive ANA.   Maya Nash, MD  Note - This record has been created using Animal nutritionist.  Chart creation errors have been sought, but may not always  have been located. Such creation errors do not reflect on  the standard of medical care.

## 2024-04-14 ENCOUNTER — Ambulatory Visit (INDEPENDENT_AMBULATORY_CARE_PROVIDER_SITE_OTHER)

## 2024-04-14 ENCOUNTER — Ambulatory Visit

## 2024-04-14 ENCOUNTER — Encounter: Payer: Self-pay | Admitting: Rheumatology

## 2024-04-14 ENCOUNTER — Ambulatory Visit: Payer: Medicaid Other | Attending: Rheumatology | Admitting: Rheumatology

## 2024-04-14 VITALS — BP 143/91 | HR 61 | Resp 16 | Ht 63.5 in | Wt 195.0 lb

## 2024-04-14 DIAGNOSIS — M5030 Other cervical disc degeneration, unspecified cervical region: Secondary | ICD-10-CM | POA: Insufficient documentation

## 2024-04-14 DIAGNOSIS — M2550 Pain in unspecified joint: Secondary | ICD-10-CM | POA: Insufficient documentation

## 2024-04-14 DIAGNOSIS — M546 Pain in thoracic spine: Secondary | ICD-10-CM | POA: Diagnosis present

## 2024-04-14 DIAGNOSIS — J3089 Other allergic rhinitis: Secondary | ICD-10-CM | POA: Diagnosis present

## 2024-04-14 DIAGNOSIS — M797 Fibromyalgia: Secondary | ICD-10-CM | POA: Diagnosis present

## 2024-04-14 DIAGNOSIS — K580 Irritable bowel syndrome with diarrhea: Secondary | ICD-10-CM | POA: Diagnosis present

## 2024-04-14 DIAGNOSIS — M79671 Pain in right foot: Secondary | ICD-10-CM | POA: Diagnosis not present

## 2024-04-14 DIAGNOSIS — M25561 Pain in right knee: Secondary | ICD-10-CM

## 2024-04-14 DIAGNOSIS — Z840 Family history of diseases of the skin and subcutaneous tissue: Secondary | ICD-10-CM | POA: Diagnosis present

## 2024-04-14 DIAGNOSIS — M51369 Other intervertebral disc degeneration, lumbar region without mention of lumbar back pain or lower extremity pain: Secondary | ICD-10-CM | POA: Diagnosis present

## 2024-04-14 DIAGNOSIS — R768 Other specified abnormal immunological findings in serum: Secondary | ICD-10-CM | POA: Insufficient documentation

## 2024-04-14 DIAGNOSIS — M79642 Pain in left hand: Secondary | ICD-10-CM

## 2024-04-14 DIAGNOSIS — M25562 Pain in left knee: Secondary | ICD-10-CM

## 2024-04-14 DIAGNOSIS — G4709 Other insomnia: Secondary | ICD-10-CM | POA: Diagnosis present

## 2024-04-14 DIAGNOSIS — K760 Fatty (change of) liver, not elsewhere classified: Secondary | ICD-10-CM | POA: Diagnosis present

## 2024-04-14 DIAGNOSIS — M25551 Pain in right hip: Secondary | ICD-10-CM | POA: Insufficient documentation

## 2024-04-14 DIAGNOSIS — M79641 Pain in right hand: Secondary | ICD-10-CM | POA: Diagnosis not present

## 2024-04-14 DIAGNOSIS — F908 Attention-deficit hyperactivity disorder, other type: Secondary | ICD-10-CM | POA: Diagnosis present

## 2024-04-14 DIAGNOSIS — R1319 Other dysphagia: Secondary | ICD-10-CM | POA: Insufficient documentation

## 2024-04-14 DIAGNOSIS — E785 Hyperlipidemia, unspecified: Secondary | ICD-10-CM | POA: Insufficient documentation

## 2024-04-14 DIAGNOSIS — G8929 Other chronic pain: Secondary | ICD-10-CM

## 2024-04-14 DIAGNOSIS — G894 Chronic pain syndrome: Secondary | ICD-10-CM | POA: Diagnosis present

## 2024-04-14 DIAGNOSIS — R5383 Other fatigue: Secondary | ICD-10-CM | POA: Diagnosis present

## 2024-04-14 DIAGNOSIS — J4520 Mild intermittent asthma, uncomplicated: Secondary | ICD-10-CM | POA: Diagnosis present

## 2024-04-14 DIAGNOSIS — K219 Gastro-esophageal reflux disease without esophagitis: Secondary | ICD-10-CM | POA: Insufficient documentation

## 2024-04-14 DIAGNOSIS — M79672 Pain in left foot: Secondary | ICD-10-CM | POA: Insufficient documentation

## 2024-04-14 DIAGNOSIS — K449 Diaphragmatic hernia without obstruction or gangrene: Secondary | ICD-10-CM | POA: Diagnosis present

## 2024-04-14 DIAGNOSIS — F419 Anxiety disorder, unspecified: Secondary | ICD-10-CM | POA: Diagnosis present

## 2024-04-14 DIAGNOSIS — G5603 Carpal tunnel syndrome, bilateral upper limbs: Secondary | ICD-10-CM

## 2024-04-17 ENCOUNTER — Ambulatory Visit: Payer: Self-pay | Admitting: Rheumatology

## 2024-04-17 NOTE — Progress Notes (Signed)
 CBC normal, CMP shows mildly elevated creatinine, sed rate normal, CK normal, uric acid normal, rheumatoid factor negative, anti-CCP negative, ANA negative, ENA panel negative except low titer positive RNP, complements normal, clotting antibodies anticardiolipin and beta-2  GP 1 negative, SPEP normal, urine protein creatinine ratio normal, G6PD pending.  I will discuss results at the follow-up visit.

## 2024-04-21 ENCOUNTER — Encounter: Payer: Self-pay | Admitting: Gastroenterology

## 2024-04-21 LAB — CBC WITH DIFFERENTIAL/PLATELET
Absolute Lymphocytes: 1853 {cells}/uL (ref 850–3900)
Absolute Monocytes: 561 {cells}/uL (ref 200–950)
Basophils Absolute: 48 {cells}/uL (ref 0–200)
Basophils Relative: 0.5 %
Eosinophils Absolute: 124 {cells}/uL (ref 15–500)
Eosinophils Relative: 1.3 %
HCT: 47.1 % — ABNORMAL HIGH (ref 35.0–45.0)
Hemoglobin: 15.2 g/dL (ref 11.7–15.5)
MCH: 29.2 pg (ref 27.0–33.0)
MCHC: 32.3 g/dL (ref 32.0–36.0)
MCV: 90.6 fL (ref 80.0–100.0)
MPV: 10.3 fL (ref 7.5–12.5)
Monocytes Relative: 5.9 %
Neutro Abs: 6916 {cells}/uL (ref 1500–7800)
Neutrophils Relative %: 72.8 %
Platelets: 348 Thousand/uL (ref 140–400)
RBC: 5.2 Million/uL — ABNORMAL HIGH (ref 3.80–5.10)
RDW: 12.8 % (ref 11.0–15.0)
Total Lymphocyte: 19.5 %
WBC: 9.5 Thousand/uL (ref 3.8–10.8)

## 2024-04-21 LAB — PROTEIN ELECTROPHORESIS, SERUM, WITH REFLEX
Albumin ELP: 4.4 g/dL (ref 3.8–4.8)
Alpha 1: 0.3 g/dL (ref 0.2–0.3)
Alpha 2: 0.8 g/dL (ref 0.5–0.9)
Beta 2: 0.4 g/dL (ref 0.2–0.5)
Beta Globulin: 0.5 g/dL (ref 0.4–0.6)
Gamma Globulin: 1 g/dL (ref 0.8–1.7)
Total Protein: 7.4 g/dL (ref 6.1–8.1)

## 2024-04-21 LAB — COMPREHENSIVE METABOLIC PANEL WITH GFR
AG Ratio: 1.7 (calc) (ref 1.0–2.5)
ALT: 18 U/L (ref 6–29)
AST: 13 U/L (ref 10–35)
Albumin: 4.6 g/dL (ref 3.6–5.1)
Alkaline phosphatase (APISO): 106 U/L (ref 31–125)
BUN/Creatinine Ratio: 13 (calc) (ref 6–22)
BUN: 13 mg/dL (ref 7–25)
CO2: 27 mmol/L (ref 20–32)
Calcium: 9.8 mg/dL (ref 8.6–10.2)
Chloride: 103 mmol/L (ref 98–110)
Creat: 1.04 mg/dL — ABNORMAL HIGH (ref 0.50–0.99)
Globulin: 2.7 g/dL (ref 1.9–3.7)
Glucose, Bld: 82 mg/dL (ref 65–99)
Potassium: 4.3 mmol/L (ref 3.5–5.3)
Sodium: 141 mmol/L (ref 135–146)
Total Bilirubin: 0.9 mg/dL (ref 0.2–1.2)
Total Protein: 7.3 g/dL (ref 6.1–8.1)
eGFR: 66 mL/min/1.73m2 (ref >=60)

## 2024-04-21 LAB — BETA-2 GLYCOPROTEIN ANTIBODIES
Beta-2 Glyco 1 IgA: <2 U/mL (ref <20.0)
Beta-2 Glyco 1 IgM: 2.6 U/mL (ref <20.0)
Beta-2 Glyco I IgG: <2 U/mL (ref <20.0)

## 2024-04-21 LAB — SEDIMENTATION RATE: Sed Rate: 6 mm/h (ref 0–20)

## 2024-04-21 LAB — SJOGRENS SYNDROME-B EXTRACTABLE NUCLEAR ANTIBODY: SSB (La) (ENA) Antibody, IgG: <1 AI

## 2024-04-21 LAB — CYCLIC CITRUL PEPTIDE ANTIBODY, IGG: Cyclic Citrullin Peptide Ab: <16 U

## 2024-04-21 LAB — ANA: Anti Nuclear Antibody (ANA): NEGATIVE

## 2024-04-21 LAB — URIC ACID: Uric Acid, Serum: 6.8 mg/dL (ref 2.5–7.0)

## 2024-04-21 LAB — PROTEIN / CREATININE RATIO, URINE
Creatinine, Urine: 271 mg/dL (ref 20–275)
Protein/Creat Ratio: 77 mg/g{creat} (ref 24–184)
Protein/Creatinine Ratio: 0.077 mg/mg{creat} (ref 0.024–0.184)
Total Protein, Urine: 21 mg/dL (ref 5–24)

## 2024-04-21 LAB — RNP ANTIBODY: Ribonucleic Protein(ENA) Antibody, IgG: 2.5 AI — AB

## 2024-04-21 LAB — CK: Total CK: 92 U/L (ref 20–239)

## 2024-04-21 LAB — RHEUMATOID FACTOR: Rheumatoid fact SerPl-aCnc: <10 [IU]/mL (ref <14)

## 2024-04-21 LAB — SJOGRENS SYNDROME-A EXTRACTABLE NUCLEAR ANTIBODY: SSA (Ro) (ENA) Antibody, IgG: <1 AI

## 2024-04-21 LAB — GLUCOSE 6 PHOSPHATE DEHYDROGENASE: G-6PDH: 14.7 U/g{Hb} (ref 7.0–20.5)

## 2024-04-21 LAB — C3 AND C4
C3 Complement: 181 mg/dL (ref 83–193)
C4 Complement: 23 mg/dL (ref 15–57)

## 2024-04-21 LAB — CARDIOLIPIN ANTIBODIES, IGG, IGM, IGA
Anticardiolipin IgA: <2 [APL'U]/mL (ref <20.0)
Anticardiolipin IgG: <2 [GPL'U]/mL (ref <20.0)
Anticardiolipin IgM: <2 [MPL'U]/mL (ref <20.0)

## 2024-04-21 LAB — SPECIMEN COMPROMISED

## 2024-04-21 LAB — ANTI-DNA ANTIBODY, DOUBLE-STRANDED: ds DNA Ab: 1 [IU]/mL

## 2024-04-21 LAB — ANTI-SCLERODERMA ANTIBODY: Scleroderma (Scl-70) (ENA) Antibody, IgG: <1 AI

## 2024-04-21 LAB — ANTI-SMITH ANTIBODY: ENA SM Ab Ser-aCnc: <1 AI

## 2024-04-28 ENCOUNTER — Emergency Department (HOSPITAL_COMMUNITY)
Admission: EM | Admit: 2024-04-28 | Discharge: 2024-04-28 | Disposition: A | Discharge status: Home / Self Care | Attending: Emergency Medicine | Admitting: Emergency Medicine

## 2024-04-28 ENCOUNTER — Emergency Department (HOSPITAL_COMMUNITY)

## 2024-04-28 ENCOUNTER — Other Ambulatory Visit: Payer: Self-pay

## 2024-04-28 ENCOUNTER — Encounter (HOSPITAL_COMMUNITY): Payer: Self-pay | Admitting: Emergency Medicine

## 2024-04-28 DIAGNOSIS — X500XXA Overexertion from strenuous movement or load, initial encounter: Secondary | ICD-10-CM | POA: Diagnosis not present

## 2024-04-28 DIAGNOSIS — Z79899 Other long term (current) drug therapy: Secondary | ICD-10-CM | POA: Insufficient documentation

## 2024-04-28 DIAGNOSIS — S63633A Sprain of interphalangeal joint of left middle finger, initial encounter: Secondary | ICD-10-CM | POA: Insufficient documentation

## 2024-04-28 DIAGNOSIS — S6992XA Unspecified injury of left wrist, hand and finger(s), initial encounter: Secondary | ICD-10-CM | POA: Diagnosis present

## 2024-04-28 NOTE — ED Notes (Signed)
 Portable xray at bedside.

## 2024-04-28 NOTE — ED Provider Notes (Signed)
 [place] EMERGENCY DEPARTMENT AT [facility] Provider Note   CSN: [id number] Arrival date & time: 04/28/24  9966     Patient presents with: Hand Pain   Ruth Shah is a 48 y.o. female.   She had her hand around the color of her dog.  The dog got rambunctious and twisted around to his and the color around her left middle finger and then the dog tried to run off pulling on her finger and hurting her elbow forward her to the ground.  She states that initially she felt thigh with an hour later so she started having swelling and pain in her distal left middle finger.  She tried ibuprofen  which did not seem to help.  She is concerned that she might of broke it.  Did not injure anything else although has bruises and scratches all over her upper extremities that are in various stages of healing.   Hand Pain       Prior to Admission medications   Medication Sig Start Date End Date Taking? Authorizing Provider  albuterol  (VENTOLIN  HFA) 108 (90 Base) MCG/ACT inhaler Inhale 1-2 puffs into the lungs every 6 (six) hours as needed for wheezing or shortness of breath.    [provider]  CELEBREX 50 MG capsule as needed.    [provider]  cetirizine  (ZYRTEC ) 10 MG tablet Take 10 mg by mouth daily.    [provider]  lisinopril -hydrochlorothiazide  (ZESTORETIC ) 20-25 MG tablet Take 1 tablet by mouth in the morning. 04/03/22   [provider]  ondansetron  (ZOFRAN ) 4 MG tablet Take 1 tablet (4 mg total) by mouth every 8 (eight) hours as needed for nausea or vomiting. 09/20/23   Burnetta Aures, MD  pantoprazole  (PROTONIX ) 40 MG tablet Take 40 mg by mouth 2 (two) times daily before a meal.    [provider]    Allergies: Dog epithelium (canis lupus familiaris), Morphine and codeine, Dilaudid  [hydromorphone  hcl], Duloxetine, Sulfa antibiotics, Toradol [ketorolac tromethamine], and Vicodin [hydrocodone-acetaminophen ]    Review of  Systems  Updated Vital Signs BP (!) 175/105   Pulse 78   Temp 98.4 F (36.9 C) (Oral)   Resp 18   Ht 5' 3 (1.6 m)   Wt 88 kg   SpO2 94%   BMI 34.37 kg/m   Physical Exam Vitals and nursing note reviewed.  Constitutional:      Appearance: She is well-developed.  HENT:     Head: Normocephalic and atraumatic.  Cardiovascular:     Rate and Rhythm: Normal rate and regular rhythm.  Pulmonary:     Effort: No respiratory distress.     Breath sounds: No stridor.  Abdominal:     General: There is no distension.  Musculoskeletal:        General: Tenderness (Along with edema and erythema and a purple color to her left middle finger just past the middle of the middle phalanx) present.     Cervical back: Normal range of motion.  Neurological:     Mental Status: She is alert.     (all labs ordered are listed, but only abnormal results are displayed) Labs Reviewed - No data to display  EKG: None  Radiology: DG Finger Middle Left Result Date: 04/28/2024 EXAM: 3 VIEW(S) XRAY OF THE LEFT FINGER 04/28/2024 01:31:00 AM COMPARISON: Radiographs dated 04/14/2024. CLINICAL HISTORY: Evaluate for fracture. Per triage left middle finger pain at the first knuckle. FINDINGS: BONES AND JOINTS: No acute fracture. Remote posttraumatic deformity about  the tuft of the third finger distal phalanx. No focal osseous lesion. No joint dislocation. SOFT TISSUES: The soft tissues are unremarkable. IMPRESSION: 1. No acute fracture or dislocation of the left middle finger. Electronically signed by: Norman Gatlin MD 04/28/2024 01:39 AM EDT RP Workstation: [id number]     Procedures   Medications Ordered in the ED - No data to display  Clinical Course as of 04/28/24 0546  [facility] Apr 28, 2024  0145 DG Finger Middle Left [JM]  0216 DG Finger Middle Left Viewed and interpreted by myself without fracture, subluxation or malalignment. [JM]  0217 BP(!): 175/105 H/o same. Possibly increased related to pain,  situation. [JM]    Clinical Course User Index [JM] Zaquan Duffner, Selinda, MD                                 Medical Decision Making Amount and/or Complexity of Data Reviewed Radiology: ordered. Decision-making details documented in ED Course.  Eval for fracture versus less likely dislocation subluxation.  Could have also had a ligament injury.  Patient denies need for any stronger pain medicine at this time. XR viewed and interpreted by myself without obvious fracture/dislocation/malalignment. Stable for d/c.     Final diagnoses:  Sprain of interphalangeal joint of left middle finger, initial encounter    ED Discharge Orders     None          Verdell Kincannon, Selinda, MD 04/28/24 [phone]

## 2024-04-28 NOTE — ED Triage Notes (Signed)
 Pt c/o left middle finger pain at the first knuckle.

## 2024-04-30 NOTE — Progress Notes (Signed)
 Office Visit Note  Patient: Ruth Shah             Date of Birth: Apr 28, 1976           MRN: [id number]             PCP: Jeanette Comer BRAVO, PA-C Referring: Skillman, Katherine E, * Visit Date: 05/09/2024 Occupation: @GUAROCC @  Subjective:  Pain in multiple joint  History of Present Illness: Ruth Shah is a 48 y.o. female returns today for the evaluation of polyarthralgia and myalgias.  Patient states she continues to have generalized pain and discomfort in multiple joints and muscles.  She states she is in constant discomfort.  She continues to have fatigue, dry mouth and dry eyes.  She also gives history of sores in her mouth.  She describes the meds fever blisters.  She states she has seen orthopedics for [facility] joint and was advised to surgery.  She has had several injections in the [facility] joints.  She does wear a CMC brace.  She continues to have discomfort in her right hip.  Her knee joints.  She has not noticed any joint swelling. Her blood pressure was elevated in the office today.  She states her blood pressure has been running high at home.  She has been taking blood pressure medication on a regular basis.    Activities of Daily Living:  Patient reports morning stiffness for 30-45 minutes.   Patient Reports nocturnal pain.  Difficulty dressing/grooming: Reports Difficulty climbing stairs: Reports Difficulty getting out of chair: Reports Difficulty using hands for taps, buttons, cutlery, and/or writing: Reports  Review of Systems  Constitutional:  Positive for fatigue.  HENT:  Positive for mouth sores and mouth dryness.   Eyes:  Positive for dryness.  Respiratory:  Positive for shortness of breath.   Cardiovascular:  Negative for chest pain and palpitations.  Gastrointestinal:  Negative for blood in stool, constipation and diarrhea.  Endocrine: Positive for increased urination.  Genitourinary:  Positive for involuntary urination.  Musculoskeletal:  Positive for joint  pain, gait problem, joint pain, joint swelling, myalgias, muscle weakness, morning stiffness, muscle tenderness and myalgias.  Skin:  Positive for rash and redness. Negative for color change, hair loss and sensitivity to sunlight.  Allergic/Immunologic: Positive for susceptible to infections.  Neurological:  Positive for dizziness and headaches.  Hematological:  Negative for swollen glands.  Psychiatric/Behavioral:  Positive for depressed mood and sleep disturbance. The patient is nervous/anxious.     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   Fatty liver 09/30/2020   IBS (irritable bowel syndrome) 09/30/2020   Chronic cholecystitis    Radiculopathy due to lumbar intervertebral disc disorder 10/16/2019   Rectal leakage 06/17/2019   Abdominal pain 09/03/2018   Rectal pain 09/03/2018   Mild intermittent asthma, uncomplicated 04/24/2018   Adverse food reaction 04/24/2018   Perennial allergic rhinitis 04/24/2018   Allergic urticaria 04/24/2018   Gastroesophageal reflux disease    Hiatal hernia    Hematometra 10/02/2017   Pelvic pain in female 10/02/2017   Status post vaginal hysterectomy 10/02/2017   Rectal bleeding 07/26/2017   Rectocele, female 06/28/2017   Uterine prolapse 06/28/2017   Diarrhea 04/26/2017   Chronic bilateral low back pain without sciatica 02/15/2017   Dyspepsia 12/21/2016   Dysphagia 12/21/2016   Nausea without vomiting 12/21/2016   Chronic migraine 07/20/2016  Neck pain 07/20/2016   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   H/O Genital warts 08/12/2015    Past Medical History:  Diagnosis Date   ADD (attention deficit disorder)    ANA positive    Anxiety    Arthritis    Asthma    mild seasonal reactions to mold and mildew   Back pain    Depression    Family history of adverse reaction to anesthesia    mother has severe PONV   Fatty liver     Fibromyalgia    GERD (gastroesophageal reflux disease)    Herpes simplex disease    History of hiatal hernia    History of kidney stones    Hypertension    IBS (irritable bowel syndrome) 2005   Migraines    Paresthesia of both hands    PONV (postoperative nausea and vomiting)    Ulcer, colon    Urticaria     Family History  Problem Relation Age of Onset   Cancer Mother        skin   Depression Mother    Hypertension Mother    Hyperlipidemia Mother    Diabetes Mother    Cancer Father        skin   Arthritis Father    Hypertension Father    Hyperlipidemia Father    Asthma Brother    Depression Maternal Grandmother    Hypertension Maternal Grandmother    Mental illness Maternal Grandmother    Cancer Maternal Grandfather        kidney   Hypertension Maternal Grandfather    Kidney disease Maternal Grandfather    Emphysema Paternal Grandmother    Alzheimer's disease Paternal Grandmother    Parkinson's disease Paternal Grandfather    Healthy Daughter    Autoimmune disease Daughter    Scleritis Daughter    Depression Son    Asthma Son    Anxiety disorder Son    Healthy Son    Colon cancer Neg Hx    Colon polyps Neg Hx    Allergic rhinitis Neg Hx    Eczema Neg Hx    Urticaria Neg Hx    Past Surgical History:  Procedure Laterality Date   BIOPSY  09/23/2019   Procedure: BIOPSY;  Surgeon: Harvey Margo CROME, MD;  Location: [facility];  Service: Endoscopy;;  esophagus   BIOPSY  07/31/2022   Procedure: BIOPSY;  Surgeon: Cindie Carlin POUR, DO;  Location: [facility];  Service: Endoscopy;;   CHOLECYSTECTOMY N/A 04/08/2020   Procedure: LAPAROSCOPIC CHOLECYSTECTOMY;  Surgeon: Mavis Anes, MD;  Location: [facility];  Service: General;  Laterality: N/A;   collapsed lung     after rotator cuff surgery   COLONOSCOPY WITH PROPOFOL  N/A 07/31/2022   Procedure: COLONOSCOPY WITH PROPOFOL ;  Surgeon: Cindie Carlin POUR, DO;  [facility]: [facility];  Service: Endoscopy;  Laterality: N/A;   10:30 am   ENDOMETRIAL ABLATION     ESOPHAGEAL DILATION N/A 02/01/2024   Procedure: DILATION, ESOPHAGUS;  Surgeon: Cindie Carlin POUR, DO;  [facility]: [facility];  Service: Endoscopy;  Laterality: N/A;   ESOPHAGEAL MANOMETRY N/A 10/15/2017   Procedure: ESOPHAGEAL MANOMETRY (EM);  Surgeon: Shila Gustav GAILS, MD;  Location: [facility];  Service: Endoscopy;  Laterality: N/A;   ESOPHAGOGASTRODUODENOSCOPY N/A 02/01/2024   Procedure: EGD (ESOPHAGOGASTRODUODENOSCOPY);  Surgeon: Cindie Carlin POUR, DO;  [facility]: [facility];  Service: Endoscopy;  Laterality: N/A;  10:30 am, asa 2   ESOPHAGOGASTRODUODENOSCOPY (EGD) WITH PROPOFOL  N/A 12/26/2016   Dr.  Fields: one benign-appearing, intrinsic stenosis that was widely patent and traversed. Small hiatal hernia, few small sessile polyps. Chronic gastritis. Normal duodenum, negative celiac sprue.    ESOPHAGOGASTRODUODENOSCOPY (EGD) WITH PROPOFOL  N/A 09/23/2019   Surgeon: Harvey Margo CROME, MD;  1 benign-appearing esophageal stenosis s/p dilation, esophagus was biopsied, normal examined stomach and duodenum.  Pathology with mild reactive changes, no increased intraepithelial eosinophils.   HERNIA REPAIR     inguinal as a baby    left ovarian removal due to cyst     PILONIDAL CYST / SINUS EXCISION  2009   RECTOCELE REPAIR N/A 10/02/2017   Procedure: POSTERIOR REPAIR (RECTOCELE);  Surgeon: Edsel Norleen GAILS, MD;  Location: [facility];  Service: Gynecology;  Laterality: N/A;   SAVORY DILATION N/A 09/23/2019   Procedure: SAVORY DILATION;  Surgeon: Harvey Margo CROME, MD;  [facility]: [facility];  Service: Endoscopy;  Laterality: N/A;   SHOULDER ARTHROSCOPY WITH ROTATOR CUFF REPAIR Right 09/22/2021   Procedure: SHOULDER ARTHROSCOPY WITH ROTATOR CUFF REPAIR, BICEPS TENOTOMY, SUBACROMIAL DECOMPRESSION, DEBRIDEMENT;  Surgeon: Sharl Selinda Dover, MD;  Location: [facility] OR;  Service: Orthopedics;  Laterality: Right;  120   SINOSCOPY     SPINAL CORD STIMULATOR INSERTION N/A  09/20/2023   Procedure: SPINAL CORD STIMULATOR INSERTION;  Surgeon: Burnetta Aures, MD;  Location: [facility] OR;  Service: Orthopedics;  Laterality: N/A;  3 C-Bed   TUBAL LIGATION     VAGINAL HYSTERECTOMY N/A 10/02/2017   Procedure: HYSTERECTOMY VAGINAL;  Surgeon: Edsel Norleen GAILS, MD;  Location: [facility];  Service: Gynecology;  Laterality: N/A;   Social History   Social History Narrative   Lives at home with husband and children.   Right-handed.   4 cups caffeine per week.    There is no immunization history on file for this patient.   Objective: Vital Signs: BP (!) 173/134 (BP Location: Left Arm, Patient Position: Sitting, Cuff Size: Normal)   Pulse 71   Resp 14   Ht 5' 3 (1.6 m)   Wt 196 lb 9.6 oz (89.2 kg)   BMI 34.83 kg/m    Physical Exam Vitals and nursing note reviewed.  Constitutional:      Appearance: She is well-developed.  HENT:     Head: Normocephalic and atraumatic.  Eyes:     Conjunctiva/sclera: Conjunctivae normal.  Cardiovascular:     Rate and Rhythm: Normal rate and regular rhythm.     Heart sounds: Normal heart sounds.  Pulmonary:     Effort: Pulmonary effort is normal.     Breath sounds: Normal breath sounds.  Abdominal:     General: Bowel sounds are normal.     Palpations: Abdomen is soft.  Musculoskeletal:     Cervical back: Normal range of motion.  Lymphadenopathy:     Cervical: No cervical adenopathy.  Skin:    General: Skin is warm and dry.     Capillary Refill: Capillary refill takes less than 2 seconds.  Neurological:     Mental Status: She is alert and oriented to person, place, and time.  Psychiatric:        Behavior: Behavior normal.      Musculoskeletal Exam: Cervical, thoracic and lumbar spine with good range of motion.  Shoulders, elbows, wrist joints, MCPs PIPs and DIPs with good range of motion with no synovitis.  She tenderness over CMC joints.  PIP and DIP thickening was noted.  She discomfort range of motion of her right hip joint.   Knee joints were in good range  of motion without any warmth swelling or effusion.  There was no tenderness over her ankles or MTPs.  CDAI Exam: CDAI Score: -- Patient Global: --; Provider Global: -- Swollen: --; Tender: -- Joint Exam 05/09/2024   No joint exam has been documented for this visit   There is currently no information documented on the homunculus. Go to the Rheumatology activity and complete the homunculus joint exam.  Investigation: No additional findings.  Imaging: DG Finger Middle Left Result Date: 04/28/2024 EXAM: 3 VIEW(S) XRAY OF THE LEFT FINGER 04/28/2024 01:31:00 AM COMPARISON: Radiographs dated 04/14/2024. CLINICAL HISTORY: Evaluate for fracture. Per triage left middle finger pain at the first knuckle. FINDINGS: BONES AND JOINTS: No acute fracture. Remote posttraumatic deformity about the tuft of the third finger distal phalanx. No focal osseous lesion. No joint dislocation. SOFT TISSUES: The soft tissues are unremarkable. IMPRESSION: 1. No acute fracture or dislocation of the left middle finger. Electronically signed by: Norman Gatlin MD 04/28/2024 01:39 AM EDT RP Workstation: [id number]   XR Foot 2 Views Left Result Date: 04/14/2024 PIP and DIP narrowing was noted.  No MTP, intertarsal, tibiotalar or subtalar joint space narrowing was noted.  No erosive changes were noted. Impression: These findings suggestive of osteoarthritis of the foot.  XR Foot 2 Views Right Result Date: 04/14/2024 PIP and DIP narrowing was noted.  No MTP, intertarsal, tibiotalar or subtalar joint space narrowing was noted.  No erosive changes were noted. Impression: These findings suggestive of osteoarthritis of the foot.  XR HIP UNILAT W OR W/O PELVIS 2-3 VIEWS RIGHT Result Date: 04/14/2024 No SI joint narrowing or sclerosis was noted.  No hip joint narrowing or sclerosis was noted.  No chondrocalcinosis noted. Impression: Unremarkable x-rays of the hip.  XR KNEE 3 VIEW LEFT Result Date:  04/14/2024 No medial or lateral compartment narrowing was noted.  No patellofemoral narrowing was noted.  No chondrocalcinosis was noted. Impression: Unremarkable x-rays of the knee.  XR KNEE 3 VIEW RIGHT Result Date: 04/14/2024 No medial or lateral compartment narrowing was noted.  No patellofemoral narrowing was noted.  No chondrocalcinosis was noted. Impression: Unremarkable x-rays of the knee.  XR Hand 2 View Left Result Date: 04/14/2024 Severe CMC narrowing was noted.  PIP and DIP narrowing was noted.  No MCP, intercarpal or radiocarpal joint space narrowing was noted.  No erosive changes were noted. Impression: These findings are suggestive of osteoarthritis of the hand.  XR Hand 2 View Right Result Date: 04/14/2024 CMC, PIP and DIP narrowing was noted.  No MCP, intercarpal or radiocarpal joint space narrowing was noted.  No erosive changes were noted. Impression: These findings are suggestive of osteoarthritis of the hand.   Recent Labs: Lab Results  Component Value Date   WBC 9.5 04/14/2024   HGB 15.2 04/14/2024   PLT 348 04/14/2024   NA 141 04/14/2024   K 4.3 04/14/2024   CL 103 04/14/2024   CO2 27 04/14/2024   GLUCOSE 82 04/14/2024   BUN 13 04/14/2024   CREATININE 1.04 (H) 04/14/2024   BILITOT 0.9 04/14/2024   ALKPHOS 80 05/14/2023   AST 13 04/14/2024   ALT 18 04/14/2024   PROT 7.3 04/14/2024   PROT 7.4 04/14/2024   ALBUMIN 3.8 05/14/2023   CALCIUM 9.8 04/14/2024   GFRAA >60 02/04/2020   April 14, 2024 SPEP normal, G6PD normal, CK 92, sed rate 6, uric acid 6.8, RF negative, anti-CCP negative, ANA negative, RNP 2.5, SSA negative, SSB negative, Smith negative, SCL 70 negative, dsDNA negative,  C3-C4 normal, anticardiolipin negative, beta-2  GP 1 negative, urine protein creatinine ratio normal  Speciality Comments: No specialty comments available.  Procedures:  No procedures performed Allergies: Dog epithelium (canis lupus familiaris), Morphine and codeine, Dilaudid   [hydromorphone  hcl], Duloxetine, Sulfa antibiotics, Toradol [ketorolac tromethamine], and Vicodin [hydrocodone-acetaminophen ]   Assessment / Plan:     Visit Diagnoses: Polyarthralgia - History of arthralgias and myalgias for many years.  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    She continues to have pain and discomfort in multiple joints and muscles.  No synovitis was noted on the examination.  Positive RNP antibody - ANA negative, history of preeclampsia, sicca symptoms, fatigue and arthralgias. -She has no clinical features of autoimmune disease on the examination.  She gives history of dry mouth and dry eyes.  She has facial erythema.  SSA and SSB antibodies were negative.  Over-the-counter products for dry mouth and dry eyes were discussed.  I will repeat labs in 1 year.  I advised her to contact me if she develops any new symptoms.  Plan: Protein / creatinine ratio, urine, CBC with Differential/Platelet, Comprehensive metabolic panel with GFR, Anti-DNA antibody, double-stranded, C3 and C4, Sedimentation rate, ANA, RNP Antibody  Primary osteoarthritis of both hands - Clinical and radiographic findings with suggestive of osteoarthritis.  Clinical x-ray findings were reviewed with the patient.  A handout on osteoarthritis was given.  Joint protection muscle strengthening was discussed.  Pain in right hip -she continues to have discomfort in her right hip.  X-rays obtained of the right hip joint were unremarkable.  X-ray findings were reviewed with the patient.  I advised her to discuss this further with orthopedics.  Chronic pain of both knees -warmth swelling effusion was noted.  X-rays obtained at the last visit were unremarkable.  Reamings were reviewed with the patient.  Pain in both feet -she continues to have some discomfort in her feet.  X-rays with history of osteoarthritis.  X-ray findings were reviewed with the patient.  DDD (degenerative disc disease), cervical - MRI of the cervical spine from  July 2024 showed mild spinal stenosis and multilevel spondylosis.  Pain in thoracic spine - Followed by Dr. Burnetta.  Degeneration of intervertebral disc of lumbar region without discogenic back pain or lower extremity pain - Followed by Dr. Burnetta.  Elevated blood pressure reading-blood pressure was elevated at 173/109.  Repeat blood pressure was 173/134.  Patient has several readings on her cell phone which were all with systolic in 190s and diastolic in 120s.  Advised patient to go to the emergency room but she declined.  She would like to go to the urgent care in [place] or her PCP.  Complications of elevated blood pressure including the risk of heart disease, kidney disease, stroke were discussed.  Patient finally decided to go to urgent care at [facility].  She  was also scheduled with cardiology on Monday morning.  Patient was notified.  Primary hypertension-elevated blood pressure not controlled.  Fibromyalgia-she continues to have generalized pain and discomfort from fibromyalgia.  She had positive tender points.  I will refer her to integrative therapies.  Other fatigue-related to fibromyalgia and insomnia.  She also works night shifts.  Other insomnia-sleep hygiene was discussed.  Chronic pain syndrome-he continues to be his in chronic discomfort.  Need for regular exercise and stretching was discussed.  Other medical problems are listed as follows:  Fatty liver  Dyslipidemia  Hiatal hernia  Gastroesophageal reflux disease without esophagitis  Other dysphagia  Perennial allergic rhinitis  Irritable bowel syndrome with diarrhea  Mild intermittent asthma, uncomplicated  Other specified attention deficit hyperactivity disorder (ADHD)  Anxiety  Family history of psoriasis-son  Orders: Orders Placed This Encounter  Procedures   Protein / creatinine ratio, urine   CBC with Differential/Platelet   Comprehensive metabolic panel with GFR   Anti-DNA antibody,  double-stranded   C3 and C4   Sedimentation rate   ANA   RNP Antibody   Ambulatory referral to Physical Therapy   Ambulatory referral to Cardiology   No orders of the defined types were placed in this encounter.   Follow-Up Instructions: Return in about 1 year (around 05/09/2025) for Osteoarthritis, +RNP.   Maya Nash, MD  Note - This record has been created using Animal nutritionist.  Chart creation errors have been sought, but may not always  have been located. Such creation errors do not reflect on  the standard of medical care.

## 2024-05-09 ENCOUNTER — Ambulatory Visit: Payer: Medicaid Other | Attending: Rheumatology | Admitting: Rheumatology

## 2024-05-09 ENCOUNTER — Ambulatory Visit (INDEPENDENT_AMBULATORY_CARE_PROVIDER_SITE_OTHER): Admitting: Cardiology

## 2024-05-09 ENCOUNTER — Encounter: Payer: Self-pay | Admitting: Rheumatology

## 2024-05-09 ENCOUNTER — Encounter: Payer: Self-pay | Admitting: Cardiology

## 2024-05-09 VITALS — BP 150/80 | HR 88 | Resp 16 | Ht 63.0 in | Wt 196.8 lb

## 2024-05-09 VITALS — BP 173/134 | HR 71 | Resp 14 | Ht 63.0 in | Wt 196.6 lb

## 2024-05-09 DIAGNOSIS — J4520 Mild intermittent asthma, uncomplicated: Secondary | ICD-10-CM | POA: Insufficient documentation

## 2024-05-09 DIAGNOSIS — M25562 Pain in left knee: Secondary | ICD-10-CM | POA: Insufficient documentation

## 2024-05-09 DIAGNOSIS — M19041 Primary osteoarthritis, right hand: Secondary | ICD-10-CM | POA: Diagnosis present

## 2024-05-09 DIAGNOSIS — R768 Other specified abnormal immunological findings in serum: Secondary | ICD-10-CM | POA: Insufficient documentation

## 2024-05-09 DIAGNOSIS — K219 Gastro-esophageal reflux disease without esophagitis: Secondary | ICD-10-CM | POA: Insufficient documentation

## 2024-05-09 DIAGNOSIS — G894 Chronic pain syndrome: Secondary | ICD-10-CM | POA: Insufficient documentation

## 2024-05-09 DIAGNOSIS — M797 Fibromyalgia: Secondary | ICD-10-CM | POA: Insufficient documentation

## 2024-05-09 DIAGNOSIS — G4709 Other insomnia: Secondary | ICD-10-CM | POA: Insufficient documentation

## 2024-05-09 DIAGNOSIS — I10 Essential (primary) hypertension: Secondary | ICD-10-CM | POA: Diagnosis not present

## 2024-05-09 DIAGNOSIS — Z840 Family history of diseases of the skin and subcutaneous tissue: Secondary | ICD-10-CM | POA: Diagnosis present

## 2024-05-09 DIAGNOSIS — M1990 Unspecified osteoarthritis, unspecified site: Secondary | ICD-10-CM | POA: Diagnosis present

## 2024-05-09 DIAGNOSIS — R1319 Other dysphagia: Secondary | ICD-10-CM | POA: Diagnosis present

## 2024-05-09 DIAGNOSIS — R600 Localized edema: Secondary | ICD-10-CM | POA: Diagnosis present

## 2024-05-09 DIAGNOSIS — R5383 Other fatigue: Secondary | ICD-10-CM | POA: Diagnosis present

## 2024-05-09 DIAGNOSIS — F908 Attention-deficit hyperactivity disorder, other type: Secondary | ICD-10-CM | POA: Insufficient documentation

## 2024-05-09 DIAGNOSIS — K449 Diaphragmatic hernia without obstruction or gangrene: Secondary | ICD-10-CM | POA: Diagnosis present

## 2024-05-09 DIAGNOSIS — M19042 Primary osteoarthritis, left hand: Secondary | ICD-10-CM | POA: Diagnosis present

## 2024-05-09 DIAGNOSIS — M51369 Other intervertebral disc degeneration, lumbar region without mention of lumbar back pain or lower extremity pain: Secondary | ICD-10-CM | POA: Diagnosis present

## 2024-05-09 DIAGNOSIS — R030 Elevated blood-pressure reading, without diagnosis of hypertension: Secondary | ICD-10-CM | POA: Insufficient documentation

## 2024-05-09 DIAGNOSIS — M79672 Pain in left foot: Secondary | ICD-10-CM | POA: Insufficient documentation

## 2024-05-09 DIAGNOSIS — G8929 Other chronic pain: Secondary | ICD-10-CM | POA: Insufficient documentation

## 2024-05-09 DIAGNOSIS — K580 Irritable bowel syndrome with diarrhea: Secondary | ICD-10-CM | POA: Diagnosis present

## 2024-05-09 DIAGNOSIS — M79671 Pain in right foot: Secondary | ICD-10-CM | POA: Insufficient documentation

## 2024-05-09 DIAGNOSIS — F419 Anxiety disorder, unspecified: Secondary | ICD-10-CM | POA: Diagnosis present

## 2024-05-09 DIAGNOSIS — K760 Fatty (change of) liver, not elsewhere classified: Secondary | ICD-10-CM | POA: Diagnosis present

## 2024-05-09 DIAGNOSIS — M546 Pain in thoracic spine: Secondary | ICD-10-CM | POA: Insufficient documentation

## 2024-05-09 DIAGNOSIS — M5030 Other cervical disc degeneration, unspecified cervical region: Secondary | ICD-10-CM | POA: Diagnosis present

## 2024-05-09 DIAGNOSIS — G4730 Sleep apnea, unspecified: Secondary | ICD-10-CM | POA: Diagnosis present

## 2024-05-09 DIAGNOSIS — E785 Hyperlipidemia, unspecified: Secondary | ICD-10-CM | POA: Insufficient documentation

## 2024-05-09 DIAGNOSIS — M25561 Pain in right knee: Secondary | ICD-10-CM | POA: Insufficient documentation

## 2024-05-09 DIAGNOSIS — M25551 Pain in right hip: Secondary | ICD-10-CM | POA: Diagnosis present

## 2024-05-09 DIAGNOSIS — R0602 Shortness of breath: Secondary | ICD-10-CM | POA: Insufficient documentation

## 2024-05-09 DIAGNOSIS — J3089 Other allergic rhinitis: Secondary | ICD-10-CM | POA: Diagnosis present

## 2024-05-09 DIAGNOSIS — M2550 Pain in unspecified joint: Secondary | ICD-10-CM | POA: Insufficient documentation

## 2024-05-09 MED ORDER — METOPROLOL SUCCINATE ER 25 MG PO TB24: 25.0000 mg | ORAL_TABLET | Freq: Every day | ORAL | 3 refills | Status: AC

## 2024-05-09 NOTE — Patient Instructions (Signed)
 Medication Instructions:  Your physician recommends the following medication changes.  START TAKING: Toprol  XL 25 mg at bedtime  *If you need a refill on your cardiac medications before your next appointment, please call your pharmacy*  Lab Work: No labs ordered today  If you have labs (blood work) drawn today and your tests are completely normal, you will receive your results only by: MyChart Message (if you have MyChart) OR A paper copy in the mail If you have any lab test that is abnormal or we need to change your treatment, we will call you to review the results.  Testing/Procedures: Your physician has requested that you have an echocardiogram. Echocardiography is a painless test that uses sound waves to create images of your heart. It provides your doctor with information about the size and shape of your heart and how well your heart's chambers and valves are working.   You may receive an ultrasound enhancing agent through an IV if needed to better visualize your heart during the echo. This procedure takes approximately one hour.  There are no restrictions for this procedure.  This will take place at [address] [facility] [address], [place] [address]  Please note: We ask at that you not bring children with you during ultrasound (echo/ vascular) testing. Due to room size and safety concerns, children are not allowed in the ultrasound rooms during exams. Our front office staff cannot provide observation of children in our lobby area while testing is being conducted. An adult accompanying a patient to their appointment will only be allowed in the ultrasound room at the discretion of the ultrasound technician under special circumstances. We apologize for any inconvenience.   Follow-Up: At [facility], you and your health needs are our priority.  As part of our continuing mission to provide you with exceptional heart care, our providers are all part of one team.   This team includes your primary Cardiologist (physician) and Advanced Practice Providers or APPs (Physician Assistants and Nurse Practitioners) who all work together to provide you with the care you need, when you need it.  Your next appointment:   6 week(s)  Provider:   Tylene Lunch, NP

## 2024-05-09 NOTE — Progress Notes (Addendum)
 Cardiology Office Note   Date:  05/09/2024  ID:  Ruth Shah, Ruth Shah, MRN [id number] PCP: Jeanette Comer Ruth Shah  [facility] Providers Cardiologist:  None     History of Present Illness Ruth Shah is a 48 y.o. female with past medical history of anxiety, arthritis, asthma, depression, fibromyalgia, GERD, hypertension, who is here today to establish care with concerns of elevated blood pressure.  She was evaluated at her rheumatologist's office earlier today for polyarthralgia and myalgias.  She continued to have complaints of generalized pain and discomfort in multiple joints and muscles.  Diet is a daily and constant ongoing discomfort.  She does have complaints of fatigue, dry mouth, dry eyes.  She is continue to follow with orthopedics for [facility] joint and was advised to surgery.  She has had several injections in the [facility] joints.  She does wear a [facility] brace.  Continues to complain of discomfort primarily on the radial, bilateral knees but denies any joint swelling.  Blood pressure was noted to be elevated in the office.  She states she been taking her blood pressure readings at home and her blood pressure had been high on a regular basis.  Blood pressure at her appointment was elevated at 173/109.  Repeat blood pressure was 103/134.  Patient had several readings on her cell phone with systolic blood pressures of 190s and diastolic of 120s.  At that time she was advised to present to the emergency department which she declined.  She stated she would like to go to urgent care [place] or PCP.  Complications of elevated blood pressure include the risk of heart disease, kidney disease, and stroke were discussed.  I did not appear that any medication changes were made.  She presents to clinic today stating that she had previously followed up with rheumatologist this morning to follow-up on routine labs and had an elevated blood pressure.  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  She stated that 1 she had to rush to get to her appointment in 2 she had not had any of her blood pressure medications prior to that appointment.  She states that she had noted that her blood pressure has been more elevated at home.  She had also been looking at her labs and noted a slight bump in her kidney function as in her serum creatinine but her BUN did remain stable.  She also occasionally has some shortness of breath and peripheral edema to her bilateral lower extremities.  She states she continues to work 2 jobs and continues to be active.  She has noted on occasion that she has muscle aches or cramps to the left side of her neck where the muscle actually tensed up and pushed into her trachea.  She denies any recent hospitalizations or visits to the emergency department.  ROS: 10 point review of system has been reviewed and considered negative except ones are listed in the HPI  Studies Reviewed EKG Interpretation Date/Time:  Friday May 09 2024 14:01:59 EDT Ventricular Rate:  88 PR Interval:  142 QRS Duration:  84 QT Interval:  380 QTC Calculation: 459 R Axis:   24  Text Interpretation: Normal sinus rhythm Normal ECG Left ventricular hypertrophy When compared with ECG of 10-Sep-2023 15:09, No significant change was found Confirmed by Gerard Frederick ([id number]) on 05/09/2024 2:08:08 PM    Risk Assessment/Calculations  Click here for STOP-BANG Calculator   :1}  05/09/2024    2:48 PM  STOP-BANG  Do you snore loudly? Yes  Do you often feel tired, fatigued, or sleepy during the daytime? Yes  Has anyone observed you stop breathing during sleep? Yes  Do you have (or are you being treated for) high blood pressure? Yes  Recent BMI (Calculated) 34.87  Is BMI greater than 35 kg/m2? 0=No  Age older than 49 years old? 0=No  Has large neck size > 40 cm (15.7 in, large female shirt size, large female collar size > 16) No  Gender - Female  0=No  STOP-Bang Total Score 4      If STOP-BANG Score >=3 OR two clinical symptoms - patient qualifies for WatchPAT (CPT 95800)      Sleep study ordered due to two (2) of the following clinical symptoms/diagnoses:  Excessive daytime sleepiness G47.10  Gastroesophageal reflux K21.9  Nocturia R35.1  Morning Headaches G44.221  Difficulty concentrating R41.840  Memory problems or poor judgment G31.84  Personality changes or irritability R45.4  Loud snoring R06.83  Depression F32.9  Unrefreshed by sleep G47.8  Impotence N52.9  History of high blood pressure R03.0  Insomnia G47.00  Sleep Disordered Breathing or Sleep Apnea ICD G47.33     HYPERTENSION CONTROL Vitals:   05/09/24 1403 05/09/24 1442  BP: (!) 154/100 (!) 150/80    The patient's blood pressure is elevated above target today.  In order to address the patient's elevated BP: A new medication was prescribed today.      STOP-Bang Score:  4      Physical Exam VS:  BP (!) 150/80 (BP Location: Left Arm, Patient Position: Sitting, Cuff Size: Large)   Pulse 88   Ht 5' 3 (1.6 m)   Wt 196 lb 12.8 oz (89.3 kg)   SpO2 97%   BMI 34.86 kg/m        Wt Readings from Last 3 Encounters:  05/09/24 196 lb 12.8 oz (89.3 kg)  05/09/24 196 lb 9.6 oz (89.2 kg)  04/28/24 194 lb (88 kg)    GEN: Well nourished, well developed in no acute distress NECK: No JVD; No carotid bruits CARDIAC: RRR, no murmurs, rubs, gallops RESPIRATORY:  Clear to auscultation without rales, wheezing or rhonchi  ABDOMEN: Soft, non-tender, non-distended EXTREMITIES:  No edema; No deformity   ASSESSMENT AND PLAN Hypertension with blood pressure today 150/101 50/80.  Previously earlier today patient's blood pressure was 170s over 100s.  Unfortunately that was prior to taking her medication.  She has been on lisinopril  HCTZ since the birth of her last son who is now 60 years old.  She has continued on lisinopril  HCTZ 20-25 mg in the morning and we are adding  25 of Toprol -XL at night.  Will avoid amlodipine due to already known peripheral edema.  No increase in lisinopril  at this time due to slightly elevated serum creatinine.  We did discuss MRA but with the elevation in creatinine noted we will avoid at this time.  EKG today reveals sinus rhythm with rate of 88 with LVH noted no other ischemic changes noted.  She has been encouraged to monitor pressures 1 to 2 hours postmedication ministration at home as well.  Occasional shortness of breath with peripheral edema to the bilateral lower extremities.  She has been scheduled for an echocardiogram to rule out any structural or functional abnormalities.  She would also likely benefit from conservative therapy of decreasing sodium intake, elevate extremities, foot calf pumps, and compression stockings to her lower extremities.  Arthritis with potential fibromyalgia and elevated RNP antibodies followed by rheumatology.  She also gets injections every 3 months for extreme arthritis in orthopedics has advised her of potential need for joint replacement.  Elevated blood pressure could be related to discomfort and pain from arthritis.  Ongoing management per rheumatology.  Disordered breathing with a STOP-BANG score of 4.  With symptoms and score she qualifies for a WatchPAT.  Primary care provider did recently scheduled a sleep study that was denied by her insurance that they are currently working on.  If she is unable to have sleep study completed on return we will discuss potential WatchPAT study.       Dispo: Patient to return to clinic to see MD/APP in 6 weeks or sooner if needed for further evaluation.  Signed, Kenli Waldo, NP

## 2024-05-09 NOTE — Patient Instructions (Addendum)
 Hand Exercises Hand exercises can be helpful for almost anyone. They can strengthen your hands and improve flexibility and movement. The exercises can also increase blood flow to the hands. These results can make your work and daily tasks easier for you. Hand exercises can be especially helpful for people who have joint pain from arthritis or nerve damage from using their hands over and over. These exercises can also help people who injure a hand. Exercises Most of these hand exercises are gentle stretching and motion exercises. It is usually safe to do them often throughout the day. Warming up your hands before exercise may help reduce stiffness. You can do this with gentle massage or by placing your hands in warm water  for 10-15 minutes. It is normal to feel some stretching, pulling, tightness, or mild discomfort when you begin new exercises. In time, this will improve. Remember to always be careful and stop right away if you feel sudden, very bad pain or your pain gets worse. You want to get better and be safe. Ask your health care provider which exercises are safe for you. Do exercises exactly as told by your provider and adjust them as told. Do not begin these exercises until told by your provider. Knuckle bend or claw fist  Stand or sit with your arm, hand, and all five fingers pointed straight up. Make sure to keep your wrist straight. Gently bend your fingers down toward your palm until the tips of your fingers are touching your palm. Keep your big knuckle straight and only bend the small knuckles in your fingers. Hold this position for 10 seconds. Straighten your fingers back to your starting position. Repeat this exercise 5-10 times with each hand. Full finger fist  Stand or sit with your arm, hand, and all five fingers pointed straight up. Make sure to keep your wrist straight. Gently bend your fingers into your palm until the tips of your fingers are touching the middle of your  palm. Hold this position for 10 seconds. Extend your fingers back to your starting position, stretching every joint fully. Repeat this exercise 5-10 times with each hand. Straight fist  Stand or sit with your arm, hand, and all five fingers pointed straight up. Make sure to keep your wrist straight. Gently bend your fingers at the big knuckle, where your fingers meet your hand, and at the middle knuckle. Keep the knuckle at the tips of your fingers straight and try to touch the bottom of your palm. Hold this position for 10 seconds. Extend your fingers back to your starting position, stretching every joint fully. Repeat this exercise 5-10 times with each hand. Tabletop  Stand or sit with your arm, hand, and all five fingers pointed straight up. Make sure to keep your wrist straight. Gently bend your fingers at the big knuckle, where your fingers meet your hand, as far down as you can. Keep the small knuckles in your fingers straight. Think of forming a tabletop with your fingers. Hold this position for 10 seconds. Extend your fingers back to your starting position, stretching every joint fully. Repeat this exercise 5-10 times with each hand. Finger spread  Place your hand flat on a table with your palm facing down. Make sure your wrist stays straight. Spread your fingers and thumb apart from each other as far as you can until you feel a gentle stretch. Hold this position for 10 seconds. Bring your fingers and thumb tight together again. Hold this position for 10 seconds. Repeat  this exercise 5-10 times with each hand. Making circles  Stand or sit with your arm, hand, and all five fingers pointed straight up. Make sure to keep your wrist straight. Make a circle by touching the tip of your thumb to the tip of your index finger. Hold for 10 seconds. Then open your hand wide. Repeat this motion with your thumb and each of your fingers. Repeat this exercise 5-10 times with each hand. Thumb  motion  Sit with your forearm resting on a table and your wrist straight. Your thumb should be facing up toward the ceiling. Keep your fingers relaxed as you move your thumb. Lift your thumb up as high as you can toward the ceiling. Hold for 10 seconds. Bend your thumb across your palm as far as you can, reaching the tip of your thumb for the small finger (pinkie) side of your palm. Hold for 10 seconds. Repeat this exercise 5-10 times with each hand. Grip strengthening  Hold a stress ball or other soft ball in the middle of your hand. Slowly increase the pressure, squeezing the ball as much as you can without causing pain. Think of bringing the tips of your fingers into the middle of your palm. All of your finger joints should bend when doing this exercise. Hold your squeeze for 10 seconds, then relax. Repeat this exercise 5-10 times with each hand. Contact a health care provider if: Your hand pain or discomfort gets much worse when you do an exercise. Your hand pain or discomfort does not improve within 2 hours after you exercise. If you have either of these problems, stop doing these exercises right away. Do not do them again unless your provider says that you can. Get help right away if: You develop sudden, severe hand pain or swelling. If this happens, stop doing these exercises right away. Do not do them again unless your provider says that you can. This information is not intended to replace advice given to you by your health care provider. Make sure you discuss any questions you have with your health care provider. Document Revised: 09/26/2022 Document Reviewed: 09/26/2022 [employer] Patient Education  2024 [employer] Hip Exercises Ask your health care provider which exercises are safe for you. Do exercises exactly as told by your provider and adjust them as told. It is normal to feel mild stretching, pulling, tightness, or discomfort as you do these exercises. Stop right away if you  feel sudden pain or your pain gets worse. Do not begin these exercises until told by your provider. Stretching and range-of-motion exercises These exercises warm up your muscles and joints and improve the movement and flexibility of your hip. They also help to relieve pain, numbness, and tingling. You may be asked to limit your range of motion if you had a hip replacement. Talk to your provider about these limits. Hamstrings, supine  Lie on your back (supine position). Loop a belt, towel, or exercise band over the ball of your left / right foot. The ball of your foot is on the walking surface, right under your toes. Straighten your left / right knee and slowly pull on the belt, towel, or band to raise your leg until you feel a gentle stretch behind your knee (hamstring). Do not let your knee bend while you do this. Keep your other leg flat on the floor. Hold this position for __________ seconds. Slowly return your leg to the starting position. Repeat __________ times. Complete this exercise __________ times a day.  Hip rotation  Lie on your back on a firm surface. With your left / right hand, gently pull your left / right knee toward the shoulder that is on the same side of the body. Stop when your knee is pointing toward the ceiling. Hold your left / right ankle with your other hand. Keeping your knee steady, gently pull your left / right ankle toward your other shoulder until you feel a stretch in your butt. Keep your hips and shoulders firmly planted while you do this stretch. Hold this position for __________ seconds. Repeat __________ times. Complete this exercise __________ times a day. Seated stretch This exercise is sometimes called hamstrings and adductors stretch. Sit on the floor with your legs stretched wide. Keep your knees straight during this exercise. Keeping your head and back in a straight line, bend at your waist to reach for your left foot (position A). You should feel a  stretch in your right inner thigh (adductors). Hold this position for __________ seconds. Then slowly return to the upright position. Keeping your head and back in a straight line, bend at your waist to reach forward (position B). You should feel a stretch behind both of your thighs and knees (hamstrings). Hold this position for __________ seconds. Then slowly return to the upright position. Keeping your head and back in a straight line, bend at your waist to reach for your right foot (position C). You should feel a stretch in your left inner thigh (adductors). Hold this position for __________ seconds. Then slowly return to the upright position. Repeat __________ times. Complete this exercise __________ times a day. Lunge This exercise stretches the muscles of the hip (hip flexors). Place your left / right knee on the floor and bend your other knee so that is directly over your ankle. You should be half-kneeling. Keep good posture with your head over your shoulders. Tighten your butt muscles to point your tailbone downward. This will prevent your back from arching too much. You should feel a gentle stretch in the front of your left / right thigh and hip. If you do not feel a stretch, slide your other foot forward slightly and then slowly lunge forward with your chest up until your knee once again lines up over your ankle. Make sure your tailbone continues to point downward. Hold this position for __________ seconds. Slowly return to the starting position. Repeat __________ times. Complete this exercise __________ times a day. Strengthening exercises These exercises build strength and endurance in your hip. Endurance is the ability to use your muscles for a long time, even after they get tired. Bridge This exercise strengthens the muscles of your hip (hip extensors). Lie on your back on a firm surface with your knees bent and your feet flat on the floor. Tighten your butt muscles and lift your  bottom off the floor until the trunk of your body and your hips are level with your thighs. Do not arch your back. You should feel the muscles working in your butt and the back of your thighs. If you do not feel these muscles, slide your feet 1-2 inches (2.5-5 cm) farther away from your butt. Hold this position for __________ seconds. Slowly lower your hips to the starting position. Let your muscles relax completely between repetitions. Repeat __________ times. Complete this exercise __________ times a day. Straight leg raises, side-lying This exercise strengthens the muscles that move the hip joint away from the center of the body (hip abductors). Lie on your  side with your left / right leg in the top position. Lie so your head, shoulder, hip, and knee line up. You may bend your bottom knee slightly to help you balance. Roll your hips slightly forward, so your hips are stacked directly over each other and your left / right knee is facing forward. Leading with your heel, lift your top leg 4-6 inches (10-15 cm). You should feel the muscles in your top hip lifting. Do not let your foot drift forward. Do not let your knee roll toward the ceiling. Hold this position for __________ seconds. Slowly return to the starting position. Let your muscles relax completely between repetitions. Repeat __________ times. Complete this exercise __________ times a day. Straight leg raises, side-lying This exercise strengthens the muscles that move the hip joint toward the center of the body (hip adductors). Lie on your side with your left / right leg in the bottom position. Lie so your head, shoulder, hip, and knee line up. You may place your upper foot in front to help you balance. Roll your hips slightly forward, so your hips are stacked directly over each other and your left / right knee is facing forward. Tense the muscles in your inner thigh and lift your bottom leg 4-6 inches (10-15 cm). Hold this position  for __________ seconds. Slowly return to the starting position. Let your muscles relax completely between repetitions. Repeat __________ times. Complete this exercise __________ times a day. Straight leg raises, supine This exercise strengthens the muscles in the front of your thigh (quadriceps and hip flexors). Lie on your back (supine position) with your left / right leg extended and your other knee bent. Tense the muscles in the front of your left / right thigh. You should see your kneecap slide up or see increased dimpling just above your knee. Keep these muscles tight as you raise your leg 4-6 inches (10-15 cm) off the floor. Do not let your knee bend. Hold this position for __________ seconds. Keep these muscles tense as you lower your leg. Relax the muscles slowly and completely between repetitions. Repeat __________ times. Complete this exercise __________ times a day. Hip abductors, standing This exercise strengthens the muscles that move the leg and hip joint away from the center of the body (hip abductors). Tie one end of a rubber exercise band or tubing to a secure surface, such as a chair, table, or pole. Loop the other end of the band or tubing around your left / right ankle. Keeping your ankle with the band or tubing directly opposite the secured end, step away until there is tension in the tubing or band. Hold on to a chair, table, or pole as needed for balance. Lift your left / right leg out to your side. While you do this: Keep your back upright. Keep your shoulders over your hips. Keep your toes pointing forward. Make sure to use your hip muscles to slowly lift your leg. Do not tip your body or forcefully lift your leg. Hold this position for __________ seconds. Slowly return to the starting position. Repeat __________ times. Complete this exercise __________ times a day. Squats This exercise strengthens the muscles in the front of your thigh (quadriceps). Stand in  front of a table, or stand in a doorframe so your feet and knees are in line with the frame. You may place your hands on the table or frame for balance. Slowly bend your knees and lower your hips like you are going to sit in  a chair. Keep your lower legs in a straight up-and-down position. Do not let your hips go lower than your knees. Do not bend your knees lower than told by your provider. If your hip pain increases, do not bend as low. Hold this position for ___________ seconds. Slowly push with your legs to return to standing. Do not use your hands to pull yourself to standing. Repeat __________ times. Complete this exercise __________ times a day. This information is not intended to replace advice given to you by your health care provider. Make sure you discuss any questions you have with your health care provider. Document Revised: 05/16/2022 Document Reviewed: 05/16/2022 [employer] Patient Education  2024 [employer] Exercises for Chronic Knee Pain Chronic knee pain is pain that lasts longer than 3 months. For most people with chronic knee pain, exercise and weight loss is an important part of treatment. Your health care provider may want you to focus on: Making the muscles that support your knee stronger. This can take pressure off your knee and reduce pain. Preventing knee stiffness. How far you can move your knee, keeping it there or making it farther. Losing weight (if this applies) to take pressure off your knee, lower your risk for injury, and make it easier for you to exercise. Your provider will help you make an exercise program that fits your needs and physical abilities. Below are simple, low-impact exercises you can do at home. Ask your provider or physical therapist how often you should do your exercise program and how many times to repeat each exercise. General safety tips  Get your provider's approval before doing any exercises. Start slowly and stop any time you feel  pain. Do not exercise if your knee pain is flaring up. Warm up first. Stretching a cold muscle can cause an injury. Do 5-10 minutes of easy movement or light stretching before beginning your exercises. Do 5-10 minutes of low-impact activity (like walking or cycling) before starting strengthening exercises. Contact your provider any time you have pain during or after exercising. Exercise can cause discomfort but should not be painful. It is normal to be a little stiff or sore after exercising. Stretching and range-of-motion exercises Front thigh stretch  Stand up straight and support your body by holding on to a chair or resting one hand on a wall. With your legs straight and close together, bend one knee to lift your heel up toward your butt. Using one hand for support, grab your ankle with your free hand. Pull your foot up closer toward your butt to feel the stretch in front of your thigh. Hold the stretch for 30 seconds. Repeat __________ times. Complete this exercise __________ times a day. Back thigh stretch  Sit on the floor with your back straight and your legs out straight in front of you. Place the palms of your hands on the floor and slide them toward your feet as you bend at the hip. Try to touch your nose to your knees and feel the stretch in the back of your thighs. Hold for 30 seconds. Repeat __________ times. Complete this exercise __________ times a day. Calf stretch  Stand facing a wall. Place the palms of your hands flat against the wall, arms extended, and lean slightly against the wall. Get into a lunge position with one leg bent at the knee and the other leg stretched out straight behind you. Keep both feet facing the wall and increase the bend in your knee while keeping the heel  of the other leg flat on the ground. You should feel the stretch in your calf. Hold for 30 seconds. Repeat __________ times. Complete this exercise __________ times a day. Strengthening  exercises Straight leg lift  Lie on your back with one knee bent and the other leg out straight. Slowly lift the straight leg without bending the knee. Lift until your foot is about 12 inches (30 cm) off the floor. Hold for 3-5 seconds and slowly lower your leg. Repeat __________ times. Complete this exercise __________ times a day. Single leg dip  Stand between two chairs and put both hands on the backs of the chairs for support. Extend one leg out straight with your body weight resting on the heel of the standing leg. Slowly bend your standing knee to dip your body to the level that is comfortable for you. Hold for 3-5 seconds. Repeat __________ times. Complete this exercise __________ times a day. Hamstring curls  Stand straight, knees close together, facing the back of a chair. Hold on to the back of a chair with both hands. Keep one leg straight. Bend the other knee while bringing the heel up toward the butt until the knee is bent at a 90-degree angle (right angle). Hold for 3-5 seconds. Repeat __________ times. Complete this exercise __________ times a day. Wall squat  Stand straight with your back, hips, and head against a wall. Step forward one foot at a time with your back still against the wall. Your feet should be 2 feet (61 cm) from the wall at shoulder width. Keeping your back, hips, and head against the wall, slide down the wall to as close to a sitting position as you can get. Hold for 5-10 seconds, then slowly slide back up. Repeat __________ times. Complete this exercise __________ times a day. Step-ups  Stand in front of a sturdy platform or stool that is about 6 inches (15 cm) high. Slowly step up with your left / right foot, keeping your knee in line with your hip and foot. Do not let your knee bend so far that you cannot see your toes. Hold on to a chair for balance, but do not use it for support. Slowly unlock your knee and lower yourself to the starting  position. Repeat __________ times. Complete this exercise __________ times a day. Contact a health care provider if: Your exercises cause pain. Your pain is worse after you exercise. Your pain prevents you from doing your exercises. This information is not intended to replace advice given to you by your health care provider. Make sure you discuss any questions you have with your health care provider. Document Revised: 09/26/2022 Document Reviewed: 09/26/2022 [employer] Patient Education  2024 [employer].

## 2024-06-20 ENCOUNTER — Ambulatory Visit

## 2024-06-27 ENCOUNTER — Ambulatory Visit: Admitting: Cardiology

## 2024-07-07 NOTE — Telephone Encounter (Signed)
 Not sure what we have to offer on Monday morning about blood pressures from Saturday. Are the blood pressure before or after already prescribed medications have been taken? How old is the blood pressure cuff that is being used and is it a wrist or an arm cuff? There are lots of variables.  Recommend keeping a blood pressure log daily 1 to 2 hours after medication has been taken.  If there is spikes in blood pressure noted would recommend keeping notes about things that are going on like increased amount of stress things that have changed in diet or what has been eaten.  If blood pressure continues to remain above 200 would recommend evaluation in the emergency department.

## 2024-08-07 ENCOUNTER — Encounter: Payer: Self-pay | Admitting: Internal Medicine

## 2024-08-08 ENCOUNTER — Ambulatory Visit: Attending: Cardiology

## 2024-08-08 DIAGNOSIS — R0602 Shortness of breath: Secondary | ICD-10-CM | POA: Diagnosis present

## 2024-08-08 LAB — ECHOCARDIOGRAM COMPLETE
AR max vel: 3.04 cm2
AV Area VTI: 2.97 cm2
AV Area mean vel: 2.84 cm2
AV Mean grad: 3 mmHg
AV Peak grad: 5.7 mmHg
Ao pk vel: 1.19 m/s
Area-P 1/2: 3.63 cm2
S' Lateral: 2.5 cm

## 2024-08-11 ENCOUNTER — Ambulatory Visit: Payer: Self-pay | Admitting: Cardiology

## 2024-08-15 ENCOUNTER — Encounter: Payer: Self-pay | Admitting: Cardiology

## 2024-08-15 ENCOUNTER — Ambulatory Visit

## 2024-08-15 ENCOUNTER — Ambulatory Visit: Attending: Cardiology | Admitting: Cardiology

## 2024-08-15 VITALS — BP 132/80 | HR 76 | Ht 63.0 in | Wt 201.0 lb

## 2024-08-15 DIAGNOSIS — R002 Palpitations: Secondary | ICD-10-CM | POA: Diagnosis present

## 2024-08-15 DIAGNOSIS — M1990 Unspecified osteoarthritis, unspecified site: Secondary | ICD-10-CM | POA: Diagnosis present

## 2024-08-15 DIAGNOSIS — I10 Essential (primary) hypertension: Secondary | ICD-10-CM | POA: Insufficient documentation

## 2024-08-15 DIAGNOSIS — G4730 Sleep apnea, unspecified: Secondary | ICD-10-CM | POA: Diagnosis present

## 2024-08-15 DIAGNOSIS — R0602 Shortness of breath: Secondary | ICD-10-CM | POA: Diagnosis present

## 2024-08-15 NOTE — Patient Instructions (Signed)
 Medication Instructions:  Your physician recommends that you continue on your current medications as directed. Please refer to the Current Medication list given to you today.   *If you need a refill on your cardiac medications before your next appointment, please call your pharmacy*  Lab Work: No labs ordered today  If you have labs (blood work) drawn today and your tests are completely normal, you will receive your results only by: MyChart Message (if you have MyChart) OR A paper copy in the mail If you have any lab test that is abnormal or we need to change your treatment, we will call you to review the results.  Testing/Procedures: Ruth Shah- Long Term Monitor Instructions  Your physician has requested you wear a ZIO patch monitor for 14 days.  This is a single patch monitor. [employer] supplies one patch monitor per enrollment. Additional stickers are not available. Please do not apply patch if you will be having a Nuclear Stress Test,  Echocardiogram, Cardiac CT, MRI, or Chest Xray during the period you would be wearing the  monitor. The patch cannot be worn during these tests. You cannot remove and re-apply the  ZIO XT patch monitor.  Your ZIO patch monitor will be mailed 3 day USPS to your address on file. It may take 3-5 days  to receive your monitor after you have been enrolled.  Once you have received your monitor, please review the enclosed instructions. Your monitor  has already been registered assigning a specific monitor serial # to you.  Billing and Patient Assistance Program Information  We have supplied [employer] with any of your insurance information on file for billing purposes. [employer] offers a sliding scale Patient Assistance Program for patients that do not have  insurance, or whose insurance does not completely cover the cost of the ZIO monitor.  You must apply for the Patient Assistance Program to qualify for this discounted rate.  To apply, please call [employer] at  [phone], select option 4, select option 2, ask to apply for  Patient Assistance Program. Meredeth will ask your household income, and how many people  are in your household. They will quote your out-of-pocket cost based on that information.  [employer] will also be able to set up a 38-month, interest-free payment plan if needed.  Applying the monitor   Shave hair from upper left chest.  Hold abrader disc by orange tab. Rub abrader in 40 strokes over the upper left chest as  indicated in your monitor instructions.  Clean area with 4 enclosed alcohol pads. Let dry.  Apply patch as indicated in monitor instructions. Patch will be placed under collarbone on left  side of chest with arrow pointing upward.  Rub patch adhesive wings for 2 minutes. Remove white label marked 1. Remove the white  label marked 2. Rub patch adhesive wings for 2 additional minutes.  While looking in a mirror, press and release button in center of patch. A small green light will  flash 3-4 times. This will be your only indicator that the monitor has been turned on.  Do not shower for the first 24 hours. You may shower after the first 24 hours.  Press the button if you feel a symptom. You will hear a small click. Record Date, Time and  Symptom in the Patient Logbook.  When you are ready to remove the patch, follow instructions on the last 2 pages of Patient  Logbook. Stick patch monitor onto the last page of Patient Logbook.  Place  Patient Logbook in the blue and white box. Use locking tab on box and tape box closed  securely. The blue and white box has prepaid postage on it. Please place it in the mailbox as  soon as possible. Your physician should have your test results approximately 7 days after the  monitor has been mailed back to [facility].  Call [facility] Customer Care at [phone] if you have questions regarding  your ZIO XT patch monitor. Call them immediately if you see an orange light  blinking on your  monitor.  If your monitor falls off in less than 4 days, contact our Monitor department at [phone].  If your monitor becomes loose or falls off after 4 days call [employer] at [phone] for  suggestions on securing your monitor   .Your physician has recommended that you have CT Coronary Calcium Score.  - $99 out of pocket cost at the time of your test - Call [phone] to schedule at your convenience.  [facility] [address] [place], [place] [address]   Coronary CalciumScan A coronary calcium scan is an imaging test used to look for deposits of calcium and other fatty materials (plaques) in the inner lining of the blood vessels of the heart (coronary arteries). These deposits of calcium and plaques can partly clog and narrow the coronary arteries without producing any symptoms or warning signs. This puts a person at risk for a heart attack. This test can detect these deposits before symptoms develop. Tell a health care provider about: Any allergies you have. All medicines you are taking, including vitamins, herbs, eye drops, creams, and over-the-counter medicines. Any problems you or family members have had with anesthetic medicines. Any blood disorders you have. Any surgeries you have had. Any medical conditions you have. Whether you are pregnant or may be pregnant. What are the risks? Generally, this is a safe procedure. However, problems may occur, including: Harm to a pregnant woman and her unborn baby. This test involves the use of radiation. Radiation exposure can be dangerous to a pregnant woman and her unborn baby. If you are pregnant, you generally should not have this procedure done. Slight increase in the risk of cancer. This is because of the radiation involved in the test. What happens before the procedure? No preparation is needed for this procedure. What happens during the procedure? You will  undress and remove any jewelry around your neck or chest. You will put on a hospital gown. Sticky electrodes will be placed on your chest. The electrodes will be connected to an electrocardiogram (ECG) machine to record a tracing of the electrical activity of your heart. A CT scanner will take pictures of your heart. During this time, you will be asked to lie still and hold your breath for 2-3 seconds while a picture of your heart is being taken. The procedure may vary among health care providers and hospitals. What happens after the procedure? You can get dressed. You can return to your normal activities. It is up to you to get the results of your test. Ask your health care provider, or the department that is doing the test, when your results will be ready. Summary A coronary calcium scan is an imaging test used to look for deposits of calcium and other fatty materials (plaques) in the inner lining of the blood vessels of the heart (coronary arteries). Generally, this is a safe procedure. Tell your health care provider if you are pregnant or may  be pregnant. No preparation is needed for this procedure. A CT scanner will take pictures of your heart. You can return to your normal activities after the scan is done. This information is not intended to replace advice given to you by your health care provider. Make sure you discuss any questions you have with your health care provider. Document Released: 03/09/2008 Document Revised: 07/31/2016 Document Reviewed: 07/31/2016 Elsevier Interactive Patient Education  2017 [employer].  [place]?  Is a FDA cleared portable home sleep study test that uses a watch and 3 points of contact to monitor 7 different channels, including your heart rate, oxygen saturations, body position, snoring, and chest motion.  The study is easy to use from the comfort of your own home and accurately detect sleep apnea.  Before bed, you attach the chest sensor, attached the  sleep apnea bracelet to your nondominant hand, and attach the finger probe.  After the study, the raw data is downloaded from the watch and scored for apnea events.   For more information: [email or web address]  Patient Testing Instructions:  Do not put battery into the device until bedtime when you are ready to begin the test. Please call the support number if you need assistance after following the instructions below: 24 hour support line- [phone] or [facility] support at [phone] (option 2)  Download the [employer] WatchPAT One app through the google play store or App Store  Be sure to turn on or enable access to bluetooth in settlings on your smartphone/ device  Make sure no other bluetooth devices are on and within the vicinity of your smartphone/ device and WatchPAT watch during testing.  Make sure to leave your smart phone/ device plugged in and charging all night.  When ready for bed:  Follow the instructions step by step in the WatchPAT One App to activate the testing device. For additional instructions, including video instruction, visit the WatchPAT One video on Youtube. You can search for WatchPat One within Youtube (video is 4 minutes and 18 seconds) or enter: https://youtube/watch?[email or web address]_vbiwxE Please note: You will be prompted to enter a Pin to connect via bluetooth when starting the test. The PIN will be assigned to you when you receive the test.  The device is disposable, but it recommended that you retain the device until you receive a call letting you know the study has been received and the results have been interpreted.  We will let you know if the study did not transmit to us  properly after the test is completed. You do not need to call us  to confirm the receipt of the test.  Please complete the test within 48 hours of receiving PIN.   Frequently Asked Questions:  What is Watch Bruna one?  A single use fully disposable home sleep apnea testing device and  will not need to be returned after completion.  What are the requirements to use WatchPAT one?  The be able to have a successful watchpat one sleep study, you should have your Watch pat one device, your smart phone, watch pat one app, your PIN number and Internet access What type of phone do I need?  You should have a smart phone that uses Android 5.1 and above or any Iphone with IOS 10 and above How can I download the WatchPAT one app?  Based on your device type search for WatchPAT one app either in google play for android devices or APP store for Iphone's Where will I get my PIN for the study?  Your  PIN will be provided by your physician's office. It is used for authentication and if you lose/forget your PIN, please reach out to your providers office.  I do not have Internet at home. Can I do WatchPAT one study?  WatchPAT One needs Internet connection throughout the night to be able to transmit the sleep data. You can use your home/local internet or your cellular's data package. However, it is always recommended to use home/local Internet. It is estimated that between 20MB-30MB will be used with each study.However, the application will be looking for space in the phone to start the study.  What happens if I lose internet or bluetooth connection?  During the internet disconnection, your phone will not be able to transmit the sleep data. All the data, will be stored in your phone. As soon as the internet connection is back on, the phone will being sending the sleep data. During the bluetooth disconnection, WatchPAT one will not be able to to send the sleep data to your phone. Data will be kept in the WatchPAT one until two devices have bluetooth connection back on. As soon as the connection is back on, WatchPAT one will send the sleep data to the phone.  How long do I need to wear the WatchPAT one?  After you start the study, you should wear the device at least 6 hours.  How far should I keep my  phone from the device?  During the night, your phone should be within 15 feet.  What happens if I leave the room for restroom or other reasons?  Leaving the room for any reason will not cause any problem. As soon as your get back to the room, both devices will reconnect and will continue to send the sleep data. Can I use my phone during the sleep study?  Yes, you can use your phone as usual during the study. But it is recommended to put your watchpat one on when you are ready to go to bed.  How will I get my study results?  A soon as you completed your study, your sleep data will be sent to the provider. They will then share the results with you when they are ready.     Follow-Up: At [facility], you and your health needs are our priority.  As part of our continuing mission to provide you with exceptional heart care, our providers are all part of one team.  This team includes your primary Cardiologist (physician) and Advanced Practice Providers or APPs (Physician Assistants and Nurse Practitioners) who all work together to provide you with the care you need, when you need it.  Your next appointment:   6 week(s)  Provider:   Tylene Lunch, NP

## 2024-08-15 NOTE — Progress Notes (Signed)
 Cardiology Office Note   Date:  08/15/2024  ID:  Shrita, Thien [birth year], MRN [id number] PCP: Jeanette Comer FORBES DEVONNA  [facility] Providers Cardiologist:  None Cardiology APP:  Gerard Frederick, NP     History of Present Illness Ruth Shah is a 48 y.o. female with past medical history of anxiety, arthritis, asthma, depression, fibromyalgia, GERD, hypertension, who is here today for follow-up.   She was evaluated at her rheumatologist's office earlier today for polyarthralgia and myalgias. She continued to have complaints of generalized pain and discomfort in multiple joints and muscles. Diet is a daily and constant ongoing discomfort. She does have complaints of fatigue, dry mouth, dry eyes. She is continue to follow with orthopedics for [facility] joint and was advised to surgery. She has had several injections in the [facility] joints. She does wear a [facility] brace. Continues to complain of discomfort primarily on the radial, bilateral knees but denies any joint swelling. Blood pressure was noted to be elevated in the office. She states she been taking her blood pressure readings at home and her blood pressure had been high on a regular basis. Blood pressure at her appointment was elevated at 173/109. Repeat blood pressure was 103/134. Patient had several readings on her cell phone with systolic blood pressures of 190s and diastolic of 120s. At that time she was advised to present to the emergency department which she declined. She stated she would like to go to urgent care [place] or PCP. Complications of elevated blood pressure include the risk of heart disease, kidney disease, and stroke were discussed. It did not appear that any medication changes were made.   She was seen in clinic 05/09/2024 send she had previously followed up with rheumatology this morning and had routine labs noted elevated blood pressure.  Unfortunately shortens the EMS and was advised to go to the emergency  department but she declined.  She stated she had to rush to get her appointment and she had not had any of her medications prior to her appointment.  She states that she has noted that her blood pressure has been more elevated at home.  She had been looking at her labs and noted a slight bump in her kidney function and serum creatinine but her BUN remained stable.  She occasionally has some shortness of breath and peripheral edema to her bilateral lower extremities.  She was started on Toprol -XL 25 mg at night scheduled for an echocardiogram.   She returns to clinic today with various different complaints.  She has ongoing shortness of breath that is unchanged, continued peripheral edema, and palpitations and fluttering feeling like butterflies in her chest.  She has recently started following with rheumatology.  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  She recently had an echocardiogram completed that revealed an LVEF of 60-65%.  For ongoing shortness of breath we discussed potentially referring to pulmonary.  She states that she has been compliant with her current medication regimen.  She has not had any further spikes of extremely elevated blood pressures.  Denies any hospitalizations or visits to the emergency department.  ROS: 10 point review of system has been reviewed and considered negative exception was been listed in the HPI  Studies Reviewed EKG Interpretation Date/Time:  Friday August 15 2024 13:43:43 EST Ventricular Rate:  76 PR Interval:  152 QRS Duration:  84 QT Interval:  398 QTC Calculation: 447  R Axis:   16  Text Interpretation: Normal sinus rhythm Normal ECG When compared with ECG of 09-May-2024 14:01, No significant change was found Confirmed by Gerard Frederick ([id number]) on 08/15/2024 1:47:33 PM    2d echo 08/08/2024 1. Left ventricular ejection fraction, by estimation, is 60 to 65%. Left  ventricular ejection  fraction by 3D volume is 65 %. The left ventricle has  normal function. The left ventricle has no regional wall motion  abnormalities. Left ventricular diastolic   parameters were normal. The average left ventricular global longitudinal  strain is -18.9 %. The global longitudinal strain is normal.   2. Right ventricular systolic function is normal. The right ventricular  size is normal.   3. The mitral valve is normal in structure. Mild mitral valve  regurgitation. No evidence of mitral stenosis.   4. The aortic valve is tricuspid. Aortic valve regurgitation is not  visualized. No aortic stenosis is present.   5. The inferior vena cava is normal in size with <50% respiratory  variability, suggesting right atrial pressure of 8 mmHg.   Comparison(s): No prior Echocardiogram.   Conclusion(s)/Recommendation(s): Normal biventricular function without  evidence of hemodynamically significant valvular heart disease.   Risk Assessment/Calculations       STOP-Bang Score:  4      Physical Exam VS:  BP 132/80 (BP Location: Left Arm, Patient Position: Sitting, Cuff Size: Large)   Pulse 76   Ht 5' 3 (1.6 m)   Wt 201 lb (91.2 kg)   SpO2 98%   BMI 35.61 kg/m        Wt Readings from Last 3 Encounters:  08/15/24 201 lb (91.2 kg)  05/09/24 196 lb 12.8 oz (89.3 kg)  05/09/24 196 lb 9.6 oz (89.2 kg)    GEN: Well nourished, well developed in no acute distress NECK: No JVD; No carotid bruits CARDIAC: RRR, no murmurs, rubs, gallops RESPIRATORY:  Clear to auscultation without rales, wheezing or rhonchi  ABDOMEN: Soft, non-tender, non-distended EXTREMITIES: Trace pretibial edema; No deformity   ASSESSMENT AND PLAN Hypertension with a blood pressure today of 132/80.  Blood pressure has been well-controlled.  She is continued on lisinopril  HCTZ 20/25 mg daily and Toprol -XL 25 mg at bedtime.  She has been encouraged to continue to monitor her blood pressures 1 to 2 hours postmedication  administration.  She recently had labs completed by rheumatology as well as send previously she had had elevated serum creatinine noted slightly above her baseline.  Will need to monitor closely with her antihypertensive medications.  Palpitations that are primarily noticed in the evening with associated fluttering in the chest.  EKG today reveals sinus rhythm with a rate of 76 with no acute ischemic changes.  With ongoing palpitations she has been placed on a ZIO XT monitor for 2 weeks.  This will rule out any arrhythmia or pauses.  Occasional shortness of breath that is unchanged and longstanding likely a component of deconditioning.  She had an echocardiogram completed that revealed an LVEF of 60 to 65%, no RWMA, and mild mitral regurgitation.  There is normal biventricular function without evidence of hemodynamically significant valvular heart disease.  Overall reassuring study.  With her long history of dog grooming and concern for pulmonary issues she will be referred to pulmonary for possible PFTs.  With the continuation for shortness of breath and concerning autoimmune diseases she is being worked up for she has been scheduled for a calcium scoring for further risk stratification.  With a long  discussion had today about shortness of breath and being an anginal equivalent.  Arthritis and potential fibromyalgia with elevated RNP antibodies followed by rheumatology.  Sleep disordered breathing with a STOP-BANG score of 4.  She states that previously sleep study was declined by her PCP.  With her symptoms and a score she qualifies for WatchPAT.  Will order WatchPAT at this time to determine if her insurance will cover the home sleep study.       Dispo: Patient to return to clinic to see MD/APP in 6 weeks or sooner if needed for further evaluation  Signed, Buford Bremer, NP

## 2024-09-11 ENCOUNTER — Encounter: Payer: Self-pay | Admitting: Rheumatology

## 2024-09-11 NOTE — Telephone Encounter (Signed)
 The symptoms do not appear to be suggestive of autoimmune disease.  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  Please discuss with your PCP.

## 2024-09-17 DIAGNOSIS — R002 Palpitations: Secondary | ICD-10-CM | POA: Diagnosis not present

## 2024-09-19 ENCOUNTER — Other Ambulatory Visit (HOSPITAL_BASED_OUTPATIENT_CLINIC_OR_DEPARTMENT_OTHER)

## 2024-09-19 ENCOUNTER — Ambulatory Visit: Payer: Self-pay | Admitting: Cardiology

## 2024-09-19 NOTE — Progress Notes (Signed)
 Average heart rate of 81 bpm, underlying rhythm was normal, there were rare early beats and no significant arrhythmia noted.  Overall a reassuring study.

## 2024-10-02 ENCOUNTER — Ambulatory Visit: Admitting: Cardiology

## 2024-10-17 ENCOUNTER — Encounter: Payer: Self-pay | Admitting: Cardiology

## 2024-10-17 ENCOUNTER — Ambulatory Visit: Attending: Cardiology | Admitting: Cardiology

## 2024-10-17 VITALS — BP 128/82 | HR 80 | Ht 63.0 in | Wt 198.0 lb

## 2024-10-17 DIAGNOSIS — R002 Palpitations: Secondary | ICD-10-CM | POA: Diagnosis present

## 2024-10-17 DIAGNOSIS — E66812 Obesity, class 2: Secondary | ICD-10-CM | POA: Diagnosis present

## 2024-10-17 DIAGNOSIS — M1990 Unspecified osteoarthritis, unspecified site: Secondary | ICD-10-CM | POA: Diagnosis present

## 2024-10-17 DIAGNOSIS — I10 Essential (primary) hypertension: Secondary | ICD-10-CM | POA: Insufficient documentation

## 2024-10-17 DIAGNOSIS — G4730 Sleep apnea, unspecified: Secondary | ICD-10-CM | POA: Insufficient documentation

## 2024-10-17 DIAGNOSIS — R0602 Shortness of breath: Secondary | ICD-10-CM | POA: Insufficient documentation

## 2024-10-17 NOTE — Progress Notes (Signed)
 " Cardiology Office Note   Date:  10/17/2024  ID:  Ruth, Shah Jun 10, 1976, MRN [id number] PCP: Ruth Shah  [facility] Providers Cardiologist:  None Cardiology APP:  Gerard Frederick, NP     History of Present Illness Ruth Shah is a 49 y.o. female with a past medical history of anxiety, arthritis, asthma, depression, fibromyalgia, GERD, hypertension, who is here today for follow-up.   She was evaluated at her rheumatologist's office earlier today for polyarthralgia and myalgias. She continued to have complaints of generalized pain and discomfort in multiple joints and muscles. Diet is a daily and constant ongoing discomfort. She does have complaints of fatigue, dry mouth, dry eyes. She is continue to follow with orthopedics for [facility] joint and was advised to surgery. She has had several injections in the [facility] joints. She does wear a [facility] brace. Continues to complain of discomfort primarily on the radial, bilateral knees but denies any joint swelling. Blood pressure was noted to be elevated in the office. She states she been taking her blood pressure readings at home and her blood pressure had been high on a regular basis. Blood pressure at her appointment was elevated at 173/109. Repeat blood pressure was 103/134. Patient had several readings on her cell phone with systolic blood pressures of 190s and diastolic of 120s. At that time she was advised to present to the emergency department which she declined. She stated she would like to go to urgent care [place] or PCP. Complications of elevated blood pressure include the risk of heart disease, kidney disease, and stroke were discussed. It did not appear that any medication changes were made.   She was seen in clinic 05/09/2024 send she had previously followed up with rheumatology this morning and had routine labs noted elevated blood pressure.  Unfortunately shortens the EMS and was advised to go to the emergency  department but she declined.  She stated she had to rush to get her appointment and she had not had any of her medications prior to her appointment.  She states that she has noted that her blood pressure has been more elevated at home.  She had been looking at her labs and noted a slight bump in her kidney function and serum creatinine but her BUN remained stable.  She occasionally has some shortness of breath and peripheral edema to her bilateral lower extremities.  She was started on Toprol -XL 25 mg at night scheduled for an echocardiogram.  She was last seen in clinic 08/15/2024 with varying complaints from ongoing shortness of breath that was unchanged continued peripheral edema, palpitations and fluttering feeling like butterflies in her chest.  She just recently started following with rheumatology.  She was continued on her current medication regimen without changes.  With ongoing palpitation she was placed on a ZIO XT monitor and scheduled for a WatchPAT for sleep disordered breathing.   She returns to clinic today with continued shortness of breath that is unchanged she thinks is related to her weight and continued peripheral edema.  Once a while she continues to have palpitations that are unchanged.  She is continue to follow with rheumatology and advises likely fibromyalgia.  She has not heard back from her sleep study through [facility].  She is questioning GLP-1 medications today.  She states that she has been compliant with her current medications and is asking today if she can go back on her Concerta since her blood pressure has been well-controlled.  She denies  any recent hospitalizations or visits to the emergency department.  ROS: Point review of system has been reviewed and considered negative the exception was been listed in the HPI  Studies Reviewed     Event Monitor (Zio) 09/17/2024 Patient had a min HR of 47 bpm, max HR of 144 bpm, and avg HR of 81 bpm. Predominant underlying rhythm  was Sinus Rhythm.  Rare PACs and rare PVCs. No significant arrhythmia. Multiple triggered events did not correlate with arrhythmia.  2d echo 08/08/2024 1. Left ventricular ejection fraction, by estimation, is 60 to 65%. Left  ventricular ejection fraction by 3D volume is 65 %. The left ventricle has  normal function. The left ventricle has no regional wall motion  abnormalities. Left ventricular diastolic   parameters were normal. The average left ventricular global longitudinal  strain is -18.9 %. The global longitudinal strain is normal.   2. Right ventricular systolic function is normal. The right ventricular  size is normal.   3. The mitral valve is normal in structure. Mild mitral valve  regurgitation. No evidence of mitral stenosis.   4. The aortic valve is tricuspid. Aortic valve regurgitation is not  visualized. No aortic stenosis is present.   5. The inferior vena cava is normal in size with <50% respiratory  variability, suggesting right atrial pressure of 8 mmHg.   Comparison(s): No prior Echocardiogram.   Conclusion(s)/Recommendation(s): Normal biventricular function without  evidence of hemodynamically significant valvular heart disease.   Risk Assessment/Calculations       STOP-Bang Score:  4      Physical Exam VS:  BP 128/82 (BP Location: Left Arm, Patient Position: Sitting, Cuff Size: Large)   Pulse 80   Ht 5' 3 (1.6 m)   Wt 198 lb (89.8 kg)   SpO2 98%   BMI 35.07 kg/m        Wt Readings from Last 3 Encounters:  10/17/24 198 lb (89.8 kg)  08/15/24 201 lb (91.2 kg)  05/09/24 196 lb 12.8 oz (89.3 kg)    GEN: Well nourished, well developed in no acute distress NECK: No JVD; No carotid bruits CARDIAC: RRR, no murmurs, rubs, gallops RESPIRATORY:  Clear to auscultation without rales, wheezing or rhonchi  ABDOMEN: Soft, non-tender, non-distended EXTREMITIES: Trace pretibial edema bilaterally; No deformity   ASSESSMENT AND PLAN Hypertension with a blood  pressure today of 128/82.  Blood pressure has been well-controlled.  She is continued on lisinopril /HCTZ 20/25 mg daily and Toprol -XL 25 mg at bedtime.  She has been encouraged to continue to monitor her pressure 1 to 2 hours postmedication administration at home as well.  Patient has been advised that if her primary care provider restarts her concern and that she will need to maintain close follow-ups on her blood pressure and heart rate as there is her common side effects.  Palpitations that she is primarily noticed in the evening with associated fluttering in her chest that has improved.  Had ZIO XT monitor revealed an average heart rate of 81 bpm with no pauses or arrhythmia.  She is continued on Toprol -XL 25 mg at bedtime.  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  Echocardiogram was unrevealing.  She has been referred to pulmonary for the possibility of PFTs.  If PFTs were normal could consider right heart catheterization.  Arthritis and potential fibromyalgia with an elevated RNP antibodies continues to be followed by rheumatology.  Sleep disordered breathing  with a STOP-BANG score of 4.  She was previously scheduled for a WatchPAT but has yet to hear from them so she is being referred to pulmonary today to workup for sleep apnea.  Obesity with a BMI of 35.07.  Would would benefit from weight loss and has been discussing with her PCP about GLP-1 medications.       Dispo: Patient to return to clinic to see MD/APP in 3 months or sooner if needed for further evaluation.  Signed, Jermani Eberlein, NP   "

## 2024-10-17 NOTE — Patient Instructions (Signed)
 Medication Instructions:   Your physician recommends that you continue on your current medications as directed. Please refer to the Current Medication list given to you today.   *If you need a refill on your cardiac medications before your next appointment, please call your pharmacy*  Lab Work:  No labs ordered today   If you have labs (blood work) drawn today and your tests are completely normal, you will receive your results only by: MyChart Message (if you have MyChart) OR A paper copy in the mail If you have any lab test that is abnormal or we need to change your treatment, we will call you to review the results.  Testing/Procedures:  No test ordered today   Follow-Up:  Referral to Pulmonology for sleep altered breathing  At [facility], you and your health needs are our priority.  As part of our continuing mission to provide you with exceptional heart care, our providers are all part of one team.  This team includes your primary Cardiologist (physician) and Advanced Practice Providers or APPs (Physician Assistants and Nurse Practitioners) who all work together to provide you with the care you need, when you need it.  Your next appointment:  3 month(s)  Provider:  Tylene Lunch, NP   We recommend signing up for the patient portal called MyChart.  Sign up information is provided on this After Visit Summary.  MyChart is used to connect with patients for Virtual Visits (Telemedicine).  Patients are able to view lab/test results, encounter notes, upcoming appointments, etc.  Non-urgent messages can be sent to your provider as well.   To learn more about what you can do with MyChart, go to [email or web address].

## 2025-01-19 ENCOUNTER — Ambulatory Visit: Admitting: Pulmonary Disease

## 2025-01-20 ENCOUNTER — Ambulatory Visit: Admitting: Cardiology

## 2025-01-20 ENCOUNTER — Ambulatory Visit: Admitting: Pulmonary Disease

## 2025-05-11 ENCOUNTER — Ambulatory Visit: Admitting: Rheumatology
